# Patient Record
Sex: Female | Born: 1940 | Race: White | Hispanic: No | Marital: Married | Smoking: Never smoker
Health system: Southern US, Community
[De-identification: ages and names within clinical notes are randomized; demographics above are authoritative.]

## PROBLEM LIST (undated history)

## (undated) DIAGNOSIS — L509 Urticaria, unspecified: Secondary | ICD-10-CM

## (undated) DIAGNOSIS — I8390 Asymptomatic varicose veins of unspecified lower extremity: Secondary | ICD-10-CM

## (undated) DIAGNOSIS — I10 Essential (primary) hypertension: Secondary | ICD-10-CM

## (undated) DIAGNOSIS — L719 Rosacea, unspecified: Secondary | ICD-10-CM

## (undated) DIAGNOSIS — C801 Malignant (primary) neoplasm, unspecified: Secondary | ICD-10-CM

## (undated) DIAGNOSIS — L508 Other urticaria: Secondary | ICD-10-CM

## (undated) DIAGNOSIS — E119 Type 2 diabetes mellitus without complications: Secondary | ICD-10-CM

## (undated) DIAGNOSIS — L409 Psoriasis, unspecified: Secondary | ICD-10-CM

## (undated) DIAGNOSIS — K219 Gastro-esophageal reflux disease without esophagitis: Secondary | ICD-10-CM

## (undated) DIAGNOSIS — E538 Deficiency of other specified B group vitamins: Secondary | ICD-10-CM

## (undated) DIAGNOSIS — C4492 Squamous cell carcinoma of skin, unspecified: Secondary | ICD-10-CM

## (undated) DIAGNOSIS — R195 Other fecal abnormalities: Secondary | ICD-10-CM

## (undated) DIAGNOSIS — J189 Pneumonia, unspecified organism: Secondary | ICD-10-CM

## (undated) DIAGNOSIS — IMO0002 Reserved for concepts with insufficient information to code with codable children: Secondary | ICD-10-CM

## (undated) DIAGNOSIS — J8410 Pulmonary fibrosis, unspecified: Secondary | ICD-10-CM

## (undated) DIAGNOSIS — R112 Nausea with vomiting, unspecified: Secondary | ICD-10-CM

## (undated) DIAGNOSIS — M069 Rheumatoid arthritis, unspecified: Secondary | ICD-10-CM

## (undated) DIAGNOSIS — R519 Headache, unspecified: Secondary | ICD-10-CM

## (undated) DIAGNOSIS — M479 Spondylosis, unspecified: Secondary | ICD-10-CM

## (undated) DIAGNOSIS — Z8719 Personal history of other diseases of the digestive system: Secondary | ICD-10-CM

## (undated) DIAGNOSIS — L309 Dermatitis, unspecified: Secondary | ICD-10-CM

## (undated) DIAGNOSIS — M359 Systemic involvement of connective tissue, unspecified: Secondary | ICD-10-CM

## (undated) DIAGNOSIS — R011 Cardiac murmur, unspecified: Secondary | ICD-10-CM

## (undated) DIAGNOSIS — Z9889 Other specified postprocedural states: Secondary | ICD-10-CM

## (undated) DIAGNOSIS — M1990 Unspecified osteoarthritis, unspecified site: Secondary | ICD-10-CM

## (undated) DIAGNOSIS — M722 Plantar fascial fibromatosis: Secondary | ICD-10-CM

## (undated) DIAGNOSIS — R51 Headache: Secondary | ICD-10-CM

## (undated) DIAGNOSIS — G4730 Sleep apnea, unspecified: Secondary | ICD-10-CM

## (undated) HISTORY — PX: BILATERAL CARPAL TUNNEL RELEASE: SHX6508

## (undated) HISTORY — PX: APPENDECTOMY: SHX54

## (undated) HISTORY — PX: EYE SURGERY: SHX253

## (undated) HISTORY — PX: SKIN CANCER EXCISION: SHX779

## (undated) HISTORY — PX: SHOULDER SURGERY: SHX246

## (undated) HISTORY — PX: OTHER SURGICAL HISTORY: SHX169

## (undated) HISTORY — PX: FOOT SURGERY: SHX648

## (undated) HISTORY — PX: TONSILLECTOMY: SUR1361

## (undated) HISTORY — PX: BREAST CYST ASPIRATION: SHX578

## (undated) HISTORY — PX: CATARACT EXTRACTION: SUR2

---

## 1961-01-09 HISTORY — PX: CHOLECYSTECTOMY: SHX55

## 1966-01-09 HISTORY — PX: TUBAL LIGATION: SHX77

## 1989-01-09 HISTORY — PX: ENDOMETRIAL ABLATION: SHX621

## 2002-01-09 DIAGNOSIS — G4730 Sleep apnea, unspecified: Secondary | ICD-10-CM

## 2002-01-09 HISTORY — DX: Sleep apnea, unspecified: G47.30

## 2002-01-09 HISTORY — PX: NASAL SEPTUM SURGERY: SHX37

## 2002-12-18 ENCOUNTER — Other Ambulatory Visit: Payer: Self-pay

## 2004-05-16 ENCOUNTER — Ambulatory Visit: Payer: Self-pay | Admitting: Ophthalmology

## 2004-05-23 ENCOUNTER — Ambulatory Visit: Payer: Self-pay | Admitting: Ophthalmology

## 2004-05-26 ENCOUNTER — Ambulatory Visit: Payer: Self-pay | Admitting: Internal Medicine

## 2004-10-17 ENCOUNTER — Ambulatory Visit: Payer: Self-pay | Admitting: Internal Medicine

## 2005-06-01 ENCOUNTER — Ambulatory Visit: Payer: Self-pay | Admitting: Internal Medicine

## 2005-08-14 ENCOUNTER — Ambulatory Visit: Payer: Self-pay | Admitting: Gastroenterology

## 2006-06-04 ENCOUNTER — Ambulatory Visit: Payer: Self-pay | Admitting: Internal Medicine

## 2007-06-07 ENCOUNTER — Ambulatory Visit: Payer: Self-pay | Admitting: Internal Medicine

## 2008-06-08 ENCOUNTER — Ambulatory Visit: Payer: Self-pay | Admitting: Internal Medicine

## 2008-11-30 ENCOUNTER — Ambulatory Visit: Payer: Self-pay | Admitting: Specialist

## 2008-12-11 ENCOUNTER — Ambulatory Visit: Payer: Self-pay | Admitting: Internal Medicine

## 2009-04-21 ENCOUNTER — Ambulatory Visit: Payer: Self-pay | Admitting: Specialist

## 2009-05-31 ENCOUNTER — Ambulatory Visit: Payer: Self-pay | Admitting: Specialist

## 2009-11-23 ENCOUNTER — Ambulatory Visit: Payer: Self-pay | Admitting: Specialist

## 2009-12-14 ENCOUNTER — Ambulatory Visit: Payer: Self-pay | Admitting: Internal Medicine

## 2010-02-08 ENCOUNTER — Ambulatory Visit: Payer: Self-pay | Admitting: Anesthesiology

## 2010-02-14 ENCOUNTER — Ambulatory Visit: Payer: Self-pay | Admitting: Unknown Physician Specialty

## 2010-07-26 ENCOUNTER — Ambulatory Visit: Payer: Self-pay | Admitting: Ophthalmology

## 2010-08-01 ENCOUNTER — Ambulatory Visit: Payer: Self-pay | Admitting: Ophthalmology

## 2010-11-22 ENCOUNTER — Ambulatory Visit: Payer: Self-pay | Admitting: Specialist

## 2010-12-16 ENCOUNTER — Ambulatory Visit: Payer: Self-pay | Admitting: Internal Medicine

## 2011-12-19 ENCOUNTER — Ambulatory Visit: Payer: Self-pay | Admitting: Internal Medicine

## 2012-01-10 HISTORY — PX: OTHER SURGICAL HISTORY: SHX169

## 2013-03-14 ENCOUNTER — Ambulatory Visit: Payer: Self-pay | Admitting: Internal Medicine

## 2013-03-20 ENCOUNTER — Ambulatory Visit: Payer: Self-pay | Admitting: Internal Medicine

## 2013-04-24 DIAGNOSIS — J8410 Pulmonary fibrosis, unspecified: Secondary | ICD-10-CM | POA: Insufficient documentation

## 2013-04-24 DIAGNOSIS — M1990 Unspecified osteoarthritis, unspecified site: Secondary | ICD-10-CM | POA: Insufficient documentation

## 2013-04-24 DIAGNOSIS — M069 Rheumatoid arthritis, unspecified: Secondary | ICD-10-CM | POA: Insufficient documentation

## 2013-07-06 DIAGNOSIS — E1149 Type 2 diabetes mellitus with other diabetic neurological complication: Secondary | ICD-10-CM | POA: Insufficient documentation

## 2013-09-25 ENCOUNTER — Ambulatory Visit: Payer: Self-pay | Admitting: Rheumatology

## 2013-10-13 DIAGNOSIS — M5126 Other intervertebral disc displacement, lumbar region: Secondary | ICD-10-CM | POA: Insufficient documentation

## 2013-10-13 DIAGNOSIS — M549 Dorsalgia, unspecified: Secondary | ICD-10-CM | POA: Insufficient documentation

## 2013-10-28 ENCOUNTER — Ambulatory Visit: Payer: Self-pay | Admitting: Pain Medicine

## 2013-11-10 ENCOUNTER — Ambulatory Visit: Payer: Self-pay | Admitting: Pain Medicine

## 2013-12-11 ENCOUNTER — Ambulatory Visit: Payer: Self-pay | Admitting: Pain Medicine

## 2013-12-15 ENCOUNTER — Other Ambulatory Visit: Payer: Self-pay | Admitting: Neurosurgery

## 2013-12-22 ENCOUNTER — Encounter (HOSPITAL_COMMUNITY)
Admission: RE | Admit: 2013-12-22 | Discharge: 2013-12-22 | Disposition: A | Discharge status: Home / Self Care | Payer: Medicare Other | Source: Ambulatory Visit | Attending: Neurosurgery | Admitting: Neurosurgery

## 2013-12-22 ENCOUNTER — Encounter (HOSPITAL_COMMUNITY): Payer: Self-pay

## 2013-12-22 ENCOUNTER — Other Ambulatory Visit (HOSPITAL_COMMUNITY): Payer: Self-pay | Admitting: *Deleted

## 2013-12-22 HISTORY — DX: Other specified postprocedural states: Z98.890

## 2013-12-22 HISTORY — DX: Headache, unspecified: R51.9

## 2013-12-22 HISTORY — DX: Nausea with vomiting, unspecified: R11.2

## 2013-12-22 HISTORY — DX: Rosacea, unspecified: L71.9

## 2013-12-22 HISTORY — DX: Other specified postprocedural states: R11.2

## 2013-12-22 HISTORY — DX: Essential (primary) hypertension: I10

## 2013-12-22 HISTORY — DX: Type 2 diabetes mellitus without complications: E11.9

## 2013-12-22 HISTORY — DX: Dermatitis, unspecified: L30.9

## 2013-12-22 HISTORY — DX: Unspecified osteoarthritis, unspecified site: M19.90

## 2013-12-22 HISTORY — DX: Asymptomatic varicose veins of unspecified lower extremity: I83.90

## 2013-12-22 HISTORY — DX: Urticaria, unspecified: L50.9

## 2013-12-22 HISTORY — DX: Malignant (primary) neoplasm, unspecified: C80.1

## 2013-12-22 HISTORY — DX: Gastro-esophageal reflux disease without esophagitis: K21.9

## 2013-12-22 HISTORY — DX: Sleep apnea, unspecified: G47.30

## 2013-12-22 HISTORY — DX: Headache: R51

## 2013-12-22 LAB — CBC
HCT: 41.1 % (ref 36.0–46.0)
Hemoglobin: 13.3 g/dL (ref 12.0–15.0)
MCH: 26.5 pg (ref 26.0–34.0)
MCHC: 32.4 g/dL (ref 30.0–36.0)
MCV: 82 fL (ref 78.0–100.0)
PLATELETS: 161 10*3/uL (ref 150–400)
RBC: 5.01 MIL/uL (ref 3.87–5.11)
RDW: 14 % (ref 11.5–15.5)
WBC: 8.5 10*3/uL (ref 4.0–10.5)

## 2013-12-22 LAB — BASIC METABOLIC PANEL
Anion gap: 15 (ref 5–15)
BUN: 12 mg/dL (ref 6–23)
CALCIUM: 9.6 mg/dL (ref 8.4–10.5)
CO2: 23 mEq/L (ref 19–32)
CREATININE: 0.65 mg/dL (ref 0.50–1.10)
Chloride: 99 mEq/L (ref 96–112)
GFR calc Af Amer: >90 mL/min (ref >90)
GFR calc non Af Amer: 85 mL/min — ABNORMAL LOW (ref >90)
GLUCOSE: 180 mg/dL — AB (ref 70–99)
Potassium: 3.7 mEq/L (ref 3.7–5.3)
Sodium: 137 mEq/L (ref 137–147)

## 2013-12-22 LAB — SURGICAL PCR SCREEN
MRSA, PCR: POSITIVE — AB
Staphylococcus aureus: POSITIVE — AB

## 2013-12-22 NOTE — Pre-Procedure Instructions (Signed)
Courtney [name]  12/22/2013   Your procedure is scheduled on:  Wednesday, December 24, 2013 at 4:50 PM.   Report to [facility] Entrance "A" Admitting Office at 2:00 PM.   Call this number if you have problems the morning of surgery: [phone]   Remember:   Do not eat food or drink liquids after midnight Tuesday, 12//15/15.   Take these medicines the morning of surgery with A SIP OF WATER: bisoprolol-hydrochlorothiazide (ZIAC),  doxazosin (CARDURA), leflunomide (ARAVA),  omeprazole (PRILOSEC), ranitidine (ZANTAC)  Do not take diabetic medications morning of surgery.   Do not wear jewelry, make-up or nail polish.  Do not wear lotions, powders, or perfumes. You may wear deodorant.  Do not shave 48 hours prior to surgery.   Do not bring valuables to the hospital.  [facility] is not responsible                  for any belongings or valuables.               Contacts, dentures or bridgework may not be worn into surgery.  Leave suitcase in the car. After surgery it may be brought to your room.  For patients admitted to the hospital, discharge time is determined by your                treatment team.               Special Instructions: [facility] - Preparing for Surgery  Before surgery, you can play an important role.  Because skin is not sterile, your skin needs to be as free of germs as possible.  You can reduce the number of germs on you skin by washing with CHG (chlorahexidine gluconate) soap before surgery.  CHG is an antiseptic cleaner which kills germs and bonds with the skin to continue killing germs even after washing.  Please DO NOT use if you have an allergy to CHG or antibacterial soaps.  If your skin becomes reddened/irritated stop using the CHG and inform your nurse when you arrive at Short Stay.  Do not shave (including legs and underarms) for at least 48 hours prior to the first CHG shower.  You may shave your face.  Please follow these instructions  carefully:   1.  Shower with CHG Soap the night before surgery and the                                morning of Surgery.  2.  If you choose to wash your hair, wash your hair first as usual with your       normal shampoo.  3.  After you shampoo, rinse your hair and body thoroughly to remove the                      Shampoo.  4.  Use CHG as you would any other liquid soap.  You can apply chg directly       to the skin and wash gently with scrungie or a clean washcloth.  5.  Apply the CHG Soap to your body ONLY FROM THE NECK DOWN.        Do not use on open wounds or open sores.  Avoid contact with your eyes, ears, mouth and genitals (private parts).  Wash genitals (private parts) with your normal soap.  6.  Wash thoroughly, paying special attention to  the area where your surgery        will be performed.  7.  Thoroughly rinse your body with warm water from the neck down.  8.  DO NOT shower/wash with your normal soap after using and rinsing off       the CHG Soap.  9.  Pat yourself dry with a clean towel.            10.  Wear clean pajamas.            11.  Place clean sheets on your bed the night of your first shower and do not        sleep with pets.  Day of Surgery  Do not apply any lotions the morning of surgery.  Please wear clean clothes to the hospital.     Please read over the following fact sheets that you were given: Pain Booklet, Coughing and Deep Breathing, MRSA Information and Surgical Site Infection Prevention

## 2013-12-22 NOTE — Progress Notes (Signed)
Pt denies chest pain or sob, denies cardiac history. No EKG found in Care Everywhere.

## 2013-12-22 NOTE — Progress Notes (Signed)
Mupirocin Ointment rx called into [facility] on [address] for positive PCR of MRSA and Staph. Pt notified and voiced understanding.

## 2013-12-23 MED ORDER — CEFAZOLIN SODIUM-DEXTROSE 2-3 GM-% IV SOLR
2.0000 g | INTRAVENOUS | Status: AC
  Administered 2013-12-24: 2 g via INTRAVENOUS
  Filled 2013-12-23: qty 50

## 2013-12-23 NOTE — Progress Notes (Signed)
Anesthesia Chart Review:  Patient is a 73 year old female scheduled for right L3-4, L4-5, L5-S1 laminectomy/foraminotomy on 12/24/13 by Dr. Joya Salm.  History includes DM2 on insulin, RA, HTN, OSA without CPAP use, migraines, GERD, non-smoker, post-operative N/V, cholecystectomy, nasal septum surgery, skin cancer excision. BMI is 33.60. PCP is Dr. Emily Filbert, last visit 07/07/13 for wellness exam.  Rheumatologist is Dr. Percell Boston.   Meds: ASA (on hold), bisoprolol-HCTZ, Cardura, Remicade, Novolin N, Novolin R, Arava, lisinopril, omeprazole, rantindine, simvastatin.   EKG 12/22/13:  SR with first degree AVB, inferior infarct (age undetermined), poor precordial r wave progression - possible anterior infarct (age undetermined). There is no comparison EKG at Dr. Ammie Ferrier office or in [place] or [place]. I was able to obtain an old EKG on 07/26/10 that showed similar poor r wave progression.  Inferior leads are more difficult to read on the [facility] EKG due to dark tracing.  It looks like she has at least a low r wave in III, but aVF appears upright but low voltage.   Preoperative labs noted.  Glucose 180.  I reviewed above with anesthesiologist Dr. Tobias Alexander.  Lead aVF is now negative, but other leads probably not significantly changed since 2012.  If patient without CV symptoms he felt she could likely proceed as planned.  I called and spoke with her.  She reports good DM control. No neck mobility limitations with her RA. Last Remicade dose held per rheumatology due to upcoming surgery.  She denies chest pain, SOB at rest.  She has chronic DOE which has not changed in the past year. She is not very active due to her back issues and balance (fall risk). She walks with a walker now.  She has chronic mild ankle edema.  Reports a normal stress test and treatment for "tachycardia" (following a BP medication change) ~ 2000.  No known recent tachycardia.  No known echo or cath.  No known CAD/CHF history.  Based on this, I  would anticipate that she could proceed as planned.  [facility] Short Stay Center/Anesthesiology Phone [phone] 12/23/2013 4:13 PM

## 2013-12-23 NOTE — H&P (Signed)
Courtney [name] is an 73 y.o. female.   Chief Complaint: right leg pain HPI: one year history of lumbar pain with radiation to the right leg, no better with conservative treatment.  Past Medical History  Diagnosis Date  . Hypertension   . Arthritis     rheumatoid arthritis  . Diabetes mellitus without complication     type 2  . Varicose veins   . Sleep apnea 2004    does not use cpap  . GERD (gastroesophageal reflux disease)   . Headache     migraines in the past (none since menopause)  . Cancer     skin cancer (squamous cell)  . Hives     "chronic"  . Dermatitis   . Rosacea   . Eczema   . PONV (postoperative nausea and vomiting)     Past Surgical History  Procedure Laterality Date  . Eye surgery Bilateral 2006 and 2012    cataract surgery with lens implant  . Cholecystectomy  1963  . Tubal ligation  1968  . Endometrial ablation  1991  . Foot surgery Right     ligament and spurs  . Shoulder surgery Right     for a frozen shoulder  . Nasal septum surgery  2004  . Bilateral carpal tunnel release    . Skin cancer excision Right     leg x 4  . Eyelid surgery Bilateral 2014    Family History  Problem Relation Age of Onset  . COPD Mother   . Arthritis/Rheumatoid Mother   . Alzheimer's disease Father   . Heart attack Father    Social History:  reports that she has never smoked. She has never used smokeless tobacco. She reports that she does not drink alcohol or use illicit drugs.  Allergies:  Allergies  Allergen Reactions  . Methotrexate Derivatives Shortness Of Breath    Breathing difficulties   . Contrast Media [Iodinated Diagnostic Agents] Itching and Rash  . Inderide [Propranolol-Hctz] Rash  . Lodine [Etodolac] Rash  . Procardia [Nifedipine] Rash    No prescriptions prior to admission    Results for orders placed or performed during the hospital encounter of 12/22/13 (from the past 48 hour(s))  Surgical pcr screen     Status: Abnormal   Collection  Time: 12/22/13  2:36 PM  Result Value Ref Range   MRSA, PCR POSITIVE (A) NEGATIVE   Staphylococcus aureus POSITIVE (A) NEGATIVE    Comment:        The Xpert SA Assay (FDA approved for NASAL specimens in patients over 49 years of age), is one component of a comprehensive surveillance program.  Test performance has been validated by [employer] for patients greater than or equal to 21 year old. It is not intended to diagnose infection nor to guide or monitor treatment.   Basic metabolic panel     Status: Abnormal   Collection Time: 12/22/13  2:36 PM  Result Value Ref Range   Sodium 137 137 - 147 mEq/L   Potassium 3.7 3.7 - 5.3 mEq/L   Chloride 99 96 - 112 mEq/L   CO2 23 19 - 32 mEq/L   Glucose, Bld 180 (H) 70 - 99 mg/dL   BUN 12 6 - 23 mg/dL   Creatinine, Ser 0.65 0.50 - 1.10 mg/dL   Calcium 9.6 8.4 - 10.5 mg/dL   GFR calc non Af Amer 85 (L) >90 mL/min   GFR calc Af Amer >90 >90 mL/min    Comment: (NOTE)  The eGFR has been calculated using the CKD EPI equation. This calculation has not been validated in all clinical situations. eGFR's persistently <90 mL/min signify possible Chronic Kidney Disease.    Anion gap 15 5 - 15  CBC     Status: None   Collection Time: 12/22/13  2:36 PM  Result Value Ref Range   WBC 8.5 4.0 - 10.5 K/uL   RBC 5.01 3.87 - 5.11 MIL/uL   Hemoglobin 13.3 12.0 - 15.0 g/dL   HCT 41.1 36.0 - 46.0 %   MCV 82.0 78.0 - 100.0 fL   MCH 26.5 26.0 - 34.0 pg   MCHC 32.4 30.0 - 36.0 g/dL   RDW 14.0 11.5 - 15.5 %   Platelets 161 150 - 400 K/uL   No results found.  Review of Systems  Constitutional: Negative.   HENT: Positive for tinnitus.   Eyes: Negative.   Respiratory: Positive for shortness of breath.   Cardiovascular: Negative.   Gastrointestinal: Negative.   Genitourinary: Negative.   Musculoskeletal: Positive for back pain.  Skin: Positive for rash.  Neurological: Positive for sensory change and focal weakness.  Endo/Heme/Allergies: Negative.    Psychiatric/Behavioral: Negative.     There were no vitals taken for this visit. Physical Exam hent, nl. Neck, nl. Cv, nl. Lungs, some rales. Abdomen, soft. Extremities, nl. NEURO WEAKNESS OF RIGHT QUADRICEPS. SENSORY, NL. dtr absent rihgt knee reflex. Radiological studies shows step-off at l34,45. 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 She and her husband are aware of tisks and benefits  , M 12/23/2013, 5:57 PM

## 2013-12-23 NOTE — Progress Notes (Signed)
Mrs. Pucillo called this am stating that [facility] did not have her rx. I told her that I had left a message on their voicemail, she states she will call them back. I called [facility] to check on it and they said they did not get the message. I talked with pharmacist and gave the rx to her. I called Mrs. Colvin and let her know that I had called it in again and spoke with the pharmacist.

## 2013-12-24 ENCOUNTER — Inpatient Hospital Stay (HOSPITAL_COMMUNITY)
Admission: RE | Admit: 2013-12-24 | Discharge: 2013-12-26 | DRG: 517 | Disposition: A | Discharge status: Home / Self Care | Payer: Medicare Other | Source: Ambulatory Visit | Attending: Neurosurgery | Admitting: Neurosurgery

## 2013-12-24 ENCOUNTER — Inpatient Hospital Stay (HOSPITAL_COMMUNITY): Payer: Medicare Other

## 2013-12-24 ENCOUNTER — Inpatient Hospital Stay (HOSPITAL_COMMUNITY): Payer: Medicare Other | Admitting: Certified Registered"

## 2013-12-24 ENCOUNTER — Encounter (HOSPITAL_COMMUNITY)
Admission: RE | Disposition: A | Discharge status: Home / Self Care | Payer: Self-pay | Source: Ambulatory Visit | Attending: Neurosurgery

## 2013-12-24 ENCOUNTER — Inpatient Hospital Stay (HOSPITAL_COMMUNITY): Payer: Medicare Other | Admitting: Vascular Surgery

## 2013-12-24 ENCOUNTER — Encounter (HOSPITAL_COMMUNITY): Payer: Self-pay | Admitting: *Deleted

## 2013-12-24 DIAGNOSIS — Z7982 Long term (current) use of aspirin: Secondary | ICD-10-CM

## 2013-12-24 DIAGNOSIS — M5127 Other intervertebral disc displacement, lumbosacral region: Secondary | ICD-10-CM

## 2013-12-24 DIAGNOSIS — Z91041 Radiographic dye allergy status: Secondary | ICD-10-CM | POA: Diagnosis not present

## 2013-12-24 DIAGNOSIS — G4730 Sleep apnea, unspecified: Secondary | ICD-10-CM | POA: Diagnosis present

## 2013-12-24 DIAGNOSIS — Z79899 Other long term (current) drug therapy: Secondary | ICD-10-CM | POA: Diagnosis not present

## 2013-12-24 DIAGNOSIS — Z794 Long term (current) use of insulin: Secondary | ICD-10-CM

## 2013-12-24 DIAGNOSIS — I10 Essential (primary) hypertension: Secondary | ICD-10-CM | POA: Diagnosis present

## 2013-12-24 DIAGNOSIS — M79604 Pain in right leg: Secondary | ICD-10-CM | POA: Diagnosis present

## 2013-12-24 DIAGNOSIS — M4806 Spinal stenosis, lumbar region: Secondary | ICD-10-CM | POA: Diagnosis not present

## 2013-12-24 DIAGNOSIS — Z888 Allergy status to other drugs, medicaments and biological substances status: Secondary | ICD-10-CM | POA: Diagnosis not present

## 2013-12-24 DIAGNOSIS — M5116 Intervertebral disc disorders with radiculopathy, lumbar region: Principal | ICD-10-CM | POA: Diagnosis present

## 2013-12-24 DIAGNOSIS — Z8249 Family history of ischemic heart disease and other diseases of the circulatory system: Secondary | ICD-10-CM | POA: Diagnosis not present

## 2013-12-24 DIAGNOSIS — Z8261 Family history of arthritis: Secondary | ICD-10-CM

## 2013-12-24 DIAGNOSIS — Z820 Family history of epilepsy and other diseases of the nervous system: Secondary | ICD-10-CM | POA: Diagnosis not present

## 2013-12-24 DIAGNOSIS — E119 Type 2 diabetes mellitus without complications: Secondary | ICD-10-CM | POA: Diagnosis present

## 2013-12-24 DIAGNOSIS — Z825 Family history of asthma and other chronic lower respiratory diseases: Secondary | ICD-10-CM

## 2013-12-24 DIAGNOSIS — K219 Gastro-esophageal reflux disease without esophagitis: Secondary | ICD-10-CM | POA: Diagnosis not present

## 2013-12-24 DIAGNOSIS — M4726 Other spondylosis with radiculopathy, lumbar region: Secondary | ICD-10-CM | POA: Diagnosis present

## 2013-12-24 DIAGNOSIS — M5136 Other intervertebral disc degeneration, lumbar region: Secondary | ICD-10-CM | POA: Diagnosis present

## 2013-12-24 DIAGNOSIS — M51369 Other intervertebral disc degeneration, lumbar region without mention of lumbar back pain or lower extremity pain: Secondary | ICD-10-CM | POA: Diagnosis present

## 2013-12-24 DIAGNOSIS — M069 Rheumatoid arthritis, unspecified: Secondary | ICD-10-CM | POA: Diagnosis present

## 2013-12-24 HISTORY — PX: LUMBAR LAMINECTOMY/DECOMPRESSION MICRODISCECTOMY: SHX5026

## 2013-12-24 LAB — GLUCOSE, CAPILLARY
Glucose-Capillary: 213 mg/dL — ABNORMAL HIGH (ref 70–99)
Glucose-Capillary: 192 mg/dL — ABNORMAL HIGH (ref 70–99)
Glucose-Capillary: 283 mg/dL — ABNORMAL HIGH (ref 70–99)

## 2013-12-24 SURGERY — LUMBAR LAMINECTOMY/DECOMPRESSION MICRODISCECTOMY 3 LEVELS
Anesthesia: General | Laterality: Right

## 2013-12-24 MED ORDER — ACETAMINOPHEN 325 MG PO TABS: 650.0000 mg | ORAL_TABLET | ORAL | Status: DC | PRN

## 2013-12-24 MED ORDER — HYDROCHLOROTHIAZIDE 25 MG PO TABS
25.0000 mg | ORAL_TABLET | Freq: Every day | ORAL | Status: DC
  Administered 2013-12-24 – 2013-12-26 (×3): 25 mg via ORAL
  Filled 2013-12-24 (×3): qty 1

## 2013-12-24 MED ORDER — BUPIVACAINE LIPOSOME 1.3 % IJ SUSP
20.0000 mL | INTRAMUSCULAR | Status: AC
  Filled 2013-12-24: qty 20

## 2013-12-24 MED ORDER — PROPOFOL 10 MG/ML IV BOLUS
INTRAVENOUS | Status: DC | PRN
  Administered 2013-12-24: 130 mg via INTRAVENOUS

## 2013-12-24 MED ORDER — DIAZEPAM 5 MG PO TABS
5.0000 mg | ORAL_TABLET | Freq: Four times a day (QID) | ORAL | Status: DC | PRN
  Administered 2013-12-25: 5 mg via ORAL
  Filled 2013-12-24: qty 1

## 2013-12-24 MED ORDER — INSULIN ASPART 100 UNIT/ML ~~LOC~~ SOLN
0.0000 [IU] | Freq: Every day | SUBCUTANEOUS | Status: DC
  Administered 2013-12-24: 3 [IU] via SUBCUTANEOUS
  Administered 2013-12-25: 4 [IU] via SUBCUTANEOUS

## 2013-12-24 MED ORDER — LEFLUNOMIDE 20 MG PO TABS
10.0000 mg | ORAL_TABLET | Freq: Every day | ORAL | Status: DC
  Administered 2013-12-25 – 2013-12-26 (×2): 10 mg via ORAL
  Filled 2013-12-24 (×3): qty 1

## 2013-12-24 MED ORDER — SODIUM CHLORIDE 0.9 % IV SOLN
INTRAVENOUS | Status: DC
  Administered 2013-12-25 (×2): via INTRAVENOUS

## 2013-12-24 MED ORDER — ONDANSETRON HCL 4 MG/2ML IJ SOLN
INTRAMUSCULAR | Status: AC
  Filled 2013-12-24: qty 2

## 2013-12-24 MED ORDER — GLYCOPYRROLATE 0.2 MG/ML IJ SOLN
INTRAMUSCULAR | Status: DC | PRN
  Administered 2013-12-24: .8 mg via INTRAVENOUS

## 2013-12-24 MED ORDER — ARTIFICIAL TEARS OP OINT
TOPICAL_OINTMENT | OPHTHALMIC | Status: AC
  Filled 2013-12-24: qty 3.5

## 2013-12-24 MED ORDER — LACTATED RINGERS IV SOLN
INTRAVENOUS | Status: DC
  Administered 2013-12-24 (×2): via INTRAVENOUS

## 2013-12-24 MED ORDER — ROCURONIUM BROMIDE 100 MG/10ML IV SOLN
INTRAVENOUS | Status: DC | PRN
  Administered 2013-12-24: 20 mg via INTRAVENOUS
  Administered 2013-12-24: 30 mg via INTRAVENOUS

## 2013-12-24 MED ORDER — OXYCODONE HCL 5 MG/5ML PO SOLN: 5.0000 mg | Freq: Once | ORAL | Status: DC | PRN

## 2013-12-24 MED ORDER — THROMBIN 5000 UNITS EX SOLR
CUTANEOUS | Status: DC | PRN
  Administered 2013-12-24 (×2): 5000 [IU] via TOPICAL

## 2013-12-24 MED ORDER — SODIUM CHLORIDE 0.9 % IJ SOLN
3.0000 mL | Freq: Two times a day (BID) | INTRAMUSCULAR | Status: DC
  Administered 2013-12-24 – 2013-12-25 (×2): 3 mL via INTRAVENOUS

## 2013-12-24 MED ORDER — CEFAZOLIN SODIUM 1-5 GM-% IV SOLN
1.0000 g | Freq: Three times a day (TID) | INTRAVENOUS | Status: AC
  Administered 2013-12-24 – 2013-12-25 (×2): 1 g via INTRAVENOUS
  Filled 2013-12-24 (×2): qty 50

## 2013-12-24 MED ORDER — VANCOMYCIN HCL 1000 MG IV SOLR
INTRAVENOUS | Status: AC
  Filled 2013-12-24: qty 1000

## 2013-12-24 MED ORDER — SIMVASTATIN 40 MG PO TABS
40.0000 mg | ORAL_TABLET | Freq: Every day | ORAL | Status: DC
  Administered 2013-12-24 – 2013-12-25 (×2): 40 mg via ORAL
  Filled 2013-12-24 (×2): qty 1

## 2013-12-24 MED ORDER — ACETAMINOPHEN 650 MG RE SUPP
650.0000 mg | RECTAL | Status: DC | PRN
Start: 2013-12-24 — End: 2013-12-26

## 2013-12-24 MED ORDER — ZOLPIDEM TARTRATE 5 MG PO TABS
5.0000 mg | ORAL_TABLET | Freq: Every evening | ORAL | Status: DC | PRN
Start: 2013-12-24 — End: 2013-12-26

## 2013-12-24 MED ORDER — HYDROMORPHONE HCL 1 MG/ML IJ SOLN
0.2500 mg | INTRAMUSCULAR | Status: DC | PRN
  Administered 2013-12-24 (×2): 0.5 mg via INTRAVENOUS

## 2013-12-24 MED ORDER — INSULIN ASPART 100 UNIT/ML ~~LOC~~ SOLN
0.0000 [IU] | Freq: Three times a day (TID) | SUBCUTANEOUS | Status: DC
  Administered 2013-12-25: 20 [IU] via SUBCUTANEOUS
  Administered 2013-12-25: 15 [IU] via SUBCUTANEOUS
  Administered 2013-12-25: 20 [IU] via SUBCUTANEOUS
  Administered 2013-12-26: 11 [IU] via SUBCUTANEOUS

## 2013-12-24 MED ORDER — SUCCINYLCHOLINE CHLORIDE 20 MG/ML IJ SOLN
INTRAMUSCULAR | Status: DC | PRN
  Administered 2013-12-24: 100 mg via INTRAVENOUS

## 2013-12-24 MED ORDER — VANCOMYCIN HCL 1000 MG IV SOLR
INTRAVENOUS | Status: DC | PRN
  Administered 2013-12-24: 1000 mg

## 2013-12-24 MED ORDER — OXYCODONE HCL 5 MG PO TABS: 5.0000 mg | ORAL_TABLET | Freq: Once | ORAL | Status: DC | PRN

## 2013-12-24 MED ORDER — ONDANSETRON HCL 4 MG/2ML IJ SOLN
4.0000 mg | INTRAMUSCULAR | Status: DC | PRN
  Administered 2013-12-24: 4 mg via INTRAVENOUS
  Filled 2013-12-24: qty 2

## 2013-12-24 MED ORDER — FENTANYL CITRATE 0.05 MG/ML IJ SOLN
INTRAMUSCULAR | Status: DC | PRN
  Administered 2013-12-24: 100 ug via INTRAVENOUS
  Administered 2013-12-24 (×3): 50 ug via INTRAVENOUS
  Administered 2013-12-24: 100 ug via INTRAVENOUS
  Administered 2013-12-24: 50 ug via INTRAVENOUS
  Administered 2013-12-24: 100 ug via INTRAVENOUS

## 2013-12-24 MED ORDER — PHENOL 1.4 % MT LIQD: 1.0000 | OROMUCOSAL | Status: DC | PRN

## 2013-12-24 MED ORDER — NEOSTIGMINE METHYLSULFATE 10 MG/10ML IV SOLN
INTRAVENOUS | Status: DC | PRN
Start: 2013-12-24 — End: 2013-12-24
  Administered 2013-12-24: 5 mg via INTRAVENOUS

## 2013-12-24 MED ORDER — SODIUM CHLORIDE 0.9 % IJ SOLN: 3.0000 mL | INTRAMUSCULAR | Status: DC | PRN

## 2013-12-24 MED ORDER — FENTANYL CITRATE 0.05 MG/ML IJ SOLN
INTRAMUSCULAR | Status: AC
  Filled 2013-12-24: qty 5

## 2013-12-24 MED ORDER — MENTHOL 3 MG MT LOZG: 1.0000 | LOZENGE | OROMUCOSAL | Status: DC | PRN

## 2013-12-24 MED ORDER — DEXAMETHASONE SODIUM PHOSPHATE 10 MG/ML IJ SOLN
INTRAMUSCULAR | Status: DC | PRN
  Administered 2013-12-24: 10 mg via INTRAVENOUS

## 2013-12-24 MED ORDER — LISINOPRIL 40 MG PO TABS
40.0000 mg | ORAL_TABLET | Freq: Every day | ORAL | Status: DC
  Administered 2013-12-25 – 2013-12-26 (×2): 40 mg via ORAL
  Filled 2013-12-24: qty 1
  Filled 2013-12-24 (×2): qty 2
  Filled 2013-12-24 (×2): qty 1

## 2013-12-24 MED ORDER — 0.9 % SODIUM CHLORIDE (POUR BTL) OPTIME
TOPICAL | Status: DC | PRN
  Administered 2013-12-24: 1000 mL

## 2013-12-24 MED ORDER — BUPIVACAINE LIPOSOME 1.3 % IJ SUSP
INTRAMUSCULAR | Status: DC | PRN
  Administered 2013-12-24: 20 mL

## 2013-12-24 MED ORDER — PROMETHAZINE HCL 25 MG/ML IJ SOLN
INTRAMUSCULAR | Status: AC
  Filled 2013-12-24: qty 1

## 2013-12-24 MED ORDER — MORPHINE SULFATE 2 MG/ML IJ SOLN
1.0000 mg | INTRAMUSCULAR | Status: DC | PRN
  Administered 2013-12-24 – 2013-12-25 (×2): 2 mg via INTRAVENOUS
  Filled 2013-12-24 (×2): qty 1

## 2013-12-24 MED ORDER — DEXAMETHASONE SODIUM PHOSPHATE 10 MG/ML IJ SOLN
INTRAMUSCULAR | Status: AC
  Filled 2013-12-24: qty 1

## 2013-12-24 MED ORDER — BISOPROLOL-HYDROCHLOROTHIAZIDE 2.5-6.25 MG PO TABS
2.0000 | ORAL_TABLET | Freq: Every day | ORAL | Status: DC
  Administered 2013-12-25 – 2013-12-26 (×2): 2 via ORAL
  Filled 2013-12-24 (×3): qty 2

## 2013-12-24 MED ORDER — ASPIRIN EC 81 MG PO TBEC
81.0000 mg | DELAYED_RELEASE_TABLET | Freq: Every day | ORAL | Status: DC
  Administered 2013-12-24 – 2013-12-26 (×3): 81 mg via ORAL
  Filled 2013-12-24 (×3): qty 1

## 2013-12-24 MED ORDER — ROCURONIUM BROMIDE 50 MG/5ML IV SOLN
INTRAVENOUS | Status: AC
  Filled 2013-12-24: qty 1

## 2013-12-24 MED ORDER — OXYCODONE-ACETAMINOPHEN 5-325 MG PO TABS
1.0000 | ORAL_TABLET | ORAL | Status: DC | PRN
  Administered 2013-12-25 – 2013-12-26 (×4): 2 via ORAL
  Filled 2013-12-24 (×4): qty 2

## 2013-12-24 MED ORDER — LIDOCAINE HCL (CARDIAC) 20 MG/ML IV SOLN
INTRAVENOUS | Status: DC | PRN
  Administered 2013-12-24: 60 mg via INTRAVENOUS

## 2013-12-24 MED ORDER — PROPOFOL 10 MG/ML IV BOLUS
INTRAVENOUS | Status: AC
  Filled 2013-12-24: qty 20

## 2013-12-24 MED ORDER — LEFLUNOMIDE 10 MG PO TABS: 10.0000 mg | ORAL_TABLET | Freq: Every day | ORAL | Status: DC

## 2013-12-24 MED ORDER — INFLIXIMAB 100 MG IV SOLR: 500.0000 mg | INTRAVENOUS | Status: DC

## 2013-12-24 MED ORDER — LIDOCAINE HCL (CARDIAC) 20 MG/ML IV SOLN
INTRAVENOUS | Status: AC
  Filled 2013-12-24: qty 5

## 2013-12-24 MED ORDER — ONDANSETRON HCL 4 MG/2ML IJ SOLN
INTRAMUSCULAR | Status: DC | PRN
  Administered 2013-12-24: 4 mg via INTRAVENOUS

## 2013-12-24 MED ORDER — HEMOSTATIC AGENTS (NO CHARGE) OPTIME
TOPICAL | Status: DC | PRN
  Administered 2013-12-24: 1 via TOPICAL

## 2013-12-24 MED ORDER — HYDROMORPHONE HCL 1 MG/ML IJ SOLN
INTRAMUSCULAR | Status: AC
  Filled 2013-12-24: qty 1

## 2013-12-24 MED ORDER — PROMETHAZINE HCL 25 MG/ML IJ SOLN
6.2500 mg | INTRAMUSCULAR | Status: DC | PRN
  Administered 2013-12-24: 6.25 mg via INTRAVENOUS

## 2013-12-24 MED ORDER — DOXAZOSIN MESYLATE 8 MG PO TABS
8.0000 mg | ORAL_TABLET | Freq: Every day | ORAL | Status: DC
  Administered 2013-12-25 – 2013-12-26 (×2): 8 mg via ORAL
  Filled 2013-12-24 (×3): qty 1

## 2013-12-24 MED ORDER — PANTOPRAZOLE SODIUM 40 MG PO TBEC
40.0000 mg | DELAYED_RELEASE_TABLET | Freq: Every day | ORAL | Status: DC
  Administered 2013-12-25 – 2013-12-26 (×2): 40 mg via ORAL
  Filled 2013-12-24 (×2): qty 1

## 2013-12-24 SURGICAL SUPPLY — 58 items
BENZOIN TINCTURE PRP APPL 2/3 (GAUZE/BANDAGES/DRESSINGS) ×3 IMPLANT
BLADE CLIPPER SURG (BLADE) IMPLANT
BUR ACORN 6.0 (BURR) ×2 IMPLANT
BUR ACORN 6.0MM (BURR) ×1
BUR MATCHSTICK NEURO 3.0 LAGG (BURR) ×3 IMPLANT
CANISTER SUCT 3000ML (MISCELLANEOUS) ×3 IMPLANT
CLOSURE WOUND 1/2 X4 (GAUZE/BANDAGES/DRESSINGS) ×1
CONT SPEC 4OZ CLIKSEAL STRL BL (MISCELLANEOUS) ×3 IMPLANT
DRAPE LAPAROTOMY 100X72X124 (DRAPES) ×3 IMPLANT
DRAPE MICROSCOPE LEICA (MISCELLANEOUS) ×3 IMPLANT
DRAPE POUCH INSTRU U-SHP 10X18 (DRAPES) ×3 IMPLANT
DRSG OPSITE POSTOP 4X6 (GAUZE/BANDAGES/DRESSINGS) ×3 IMPLANT
DRSG PAD ABDOMINAL 8X10 ST (GAUZE/BANDAGES/DRESSINGS) IMPLANT
DURAPREP 26ML APPLICATOR (WOUND CARE) ×3 IMPLANT
ELECT BLADE 4.0 EZ CLEAN MEGAD (MISCELLANEOUS) ×3
ELECT REM PT RETURN 9FT ADLT (ELECTROSURGICAL) ×3
ELECTRODE BLDE 4.0 EZ CLN MEGD (MISCELLANEOUS) ×1 IMPLANT
ELECTRODE REM PT RTRN 9FT ADLT (ELECTROSURGICAL) ×1 IMPLANT
GAUZE SPONGE 4X4 12PLY STRL (GAUZE/BANDAGES/DRESSINGS) ×3 IMPLANT
GAUZE SPONGE 4X4 16PLY XRAY LF (GAUZE/BANDAGES/DRESSINGS) IMPLANT
GLOVE BIOGEL M 8.0 STRL (GLOVE) ×3 IMPLANT
GLOVE ECLIPSE 7.5 STRL STRAW (GLOVE) ×9 IMPLANT
GLOVE EXAM NITRILE LRG STRL (GLOVE) IMPLANT
GLOVE EXAM NITRILE MD LF STRL (GLOVE) IMPLANT
GLOVE EXAM NITRILE XL STR (GLOVE) IMPLANT
GLOVE EXAM NITRILE XS STR PU (GLOVE) IMPLANT
GLOVE INDICATOR 7.5 STRL GRN (GLOVE) ×6 IMPLANT
GLOVE SURG SS PI 7.0 STRL IVOR (GLOVE) ×6 IMPLANT
GOWN STRL REUS W/ TWL LRG LVL3 (GOWN DISPOSABLE) ×2 IMPLANT
GOWN STRL REUS W/ TWL XL LVL3 (GOWN DISPOSABLE) ×1 IMPLANT
GOWN STRL REUS W/TWL 2XL LVL3 (GOWN DISPOSABLE) IMPLANT
GOWN STRL REUS W/TWL LRG LVL3 (GOWN DISPOSABLE) ×4
GOWN STRL REUS W/TWL XL LVL3 (GOWN DISPOSABLE) ×2
KIT BASIN OR (CUSTOM PROCEDURE TRAY) ×3 IMPLANT
KIT ROOM TURNOVER OR (KITS) ×3 IMPLANT
NEEDLE HYPO 18GX1.5 BLUNT FILL (NEEDLE) IMPLANT
NEEDLE HYPO 21X1.5 SAFETY (NEEDLE) ×3 IMPLANT
NEEDLE HYPO 25X1 1.5 SAFETY (NEEDLE) IMPLANT
NEEDLE SPNL 20GX3.5 QUINCKE YW (NEEDLE) IMPLANT
NS IRRIG 1000ML POUR BTL (IV SOLUTION) ×3 IMPLANT
PACK LAMINECTOMY NEURO (CUSTOM PROCEDURE TRAY) ×3 IMPLANT
PAD ARMBOARD 7.5X6 YLW CONV (MISCELLANEOUS) ×9 IMPLANT
PATTIES SURGICAL .5 X1 (DISPOSABLE) ×3 IMPLANT
RUBBERBAND STERILE (MISCELLANEOUS) ×6 IMPLANT
SPONGE LAP 4X18 X RAY DECT (DISPOSABLE) IMPLANT
SPONGE SURGIFOAM ABS GEL SZ50 (HEMOSTASIS) ×3 IMPLANT
STAPLER VISISTAT 35W (STAPLE) ×3 IMPLANT
STRIP CLOSURE SKIN 1/2X4 (GAUZE/BANDAGES/DRESSINGS) ×2 IMPLANT
SUT VIC AB 0 CT1 18XCR BRD8 (SUTURE) ×1 IMPLANT
SUT VIC AB 0 CT1 8-18 (SUTURE) ×2
SUT VIC AB 2-0 CP2 18 (SUTURE) ×3 IMPLANT
SUT VIC AB 3-0 SH 8-18 (SUTURE) ×3 IMPLANT
SYR 20CC LL (SYRINGE) ×3 IMPLANT
SYR 20ML ECCENTRIC (SYRINGE) ×3 IMPLANT
SYR 5ML LL (SYRINGE) IMPLANT
TOWEL OR 17X24 6PK STRL BLUE (TOWEL DISPOSABLE) ×3 IMPLANT
TOWEL OR 17X26 10 PK STRL BLUE (TOWEL DISPOSABLE) ×3 IMPLANT
WATER STERILE IRR 1000ML POUR (IV SOLUTION) ×3 IMPLANT

## 2013-12-24 NOTE — Anesthesia Procedure Notes (Signed)
Procedure Name: Intubation Date/Time: 12/24/2013 2:33 PM Performed by: Melina Copa, Lenaya Pietsch R Pre-anesthesia Checklist: Patient identified, Emergency Drugs available, Suction available, Patient being monitored and Timeout performed Patient Re-evaluated:Patient Re-evaluated prior to inductionOxygen Delivery Method: Circle system utilized Preoxygenation: Pre-oxygenation with 100% oxygen Intubation Type: IV induction Ventilation: Mask ventilation without difficulty Laryngoscope Size: Mac and 3 Grade View: Grade II Tube type: Oral Tube size: 7.0 mm Number of attempts: 1 Airway Equipment and Method: Stylet Placement Confirmation: ETT inserted through vocal cords under direct vision,  positive ETCO2 and breath sounds checked- equal and bilateral Secured at: 22 cm Tube secured with: Tape Dental Injury: Teeth and Oropharynx as per pre-operative assessment

## 2013-12-24 NOTE — Progress Notes (Signed)
Patient arrived to unit via bed, alert and oriented, vitals taken and stable. Patient oriented to unit, call bell within reach and bed alarm set. Courtney [name]

## 2013-12-24 NOTE — Anesthesia Postprocedure Evaluation (Signed)
  Anesthesia Post-op Note  Patient: Courtney [name]  Procedure(s) Performed: Procedure(s) with comments: RIGHT LUMBAR THREE TO FOUR, LUMBAR FOUR TO FIVE, LUMBAR FIVE TO SACRAL ONE LAMINECTOMY/FORAMINOTOMY (Right) - RIGHT L3-4 L4-5 L5-S1 LAMINECTOMY/FORAMINOTOMY  Patient Location: PACU  Anesthesia Type:General  Level of Consciousness: awake, alert , oriented and patient cooperative  Airway and Oxygen Therapy: Patient Spontanous Breathing  Post-op Pain: mild  Post-op Assessment: Post-op Vital signs reviewed, Patient's Cardiovascular Status Stable, Respiratory Function Stable, Patent Airway, No signs of Nausea or vomiting and Pain level controlled  Post-op Vital Signs: stable  Last Vitals:  Filed Vitals:   12/24/13 1730  BP:   Pulse: 105  Temp:   Resp: 12    Complications: No apparent anesthesia complications

## 2013-12-24 NOTE — Transfer of Care (Signed)
Immediate Anesthesia Transfer of Care Note  Patient: Courtney [name]  Procedure(s) Performed: Procedure(s) with comments: RIGHT LUMBAR THREE TO FOUR, LUMBAR FOUR TO FIVE, LUMBAR FIVE TO SACRAL ONE LAMINECTOMY/FORAMINOTOMY (Right) - RIGHT L3-4 L4-5 L5-S1 LAMINECTOMY/FORAMINOTOMY  Patient Location: PACU  Anesthesia Type:General  Level of Consciousness: awake, alert  and oriented  Airway & Oxygen Therapy: Patient Spontanous Breathing and Patient connected to nasal cannula oxygen  Post-op Assessment: Report given to PACU RN and Patient moving all extremities X 4  Post vital signs: Reviewed and stable  Complications: No apparent anesthesia complications

## 2013-12-24 NOTE — Anesthesia Preprocedure Evaluation (Addendum)
Anesthesia Evaluation  Patient identified by MRN, date of birth, ID band Patient awake    Reviewed: Allergy & Precautions, H&P , NPO status , Patient's Chart, lab work & pertinent test results  History of Anesthesia Complications (+) PONV  Airway Mallampati: II   Neck ROM: Full    Dental  (+) Edentulous Upper, Partial Lower, Dental Advisory Given   Pulmonary sleep apnea ,  breath sounds clear to auscultation        Cardiovascular hypertension, Rhythm:Regular Rate:Normal  Denies cardiac issues of recent   Neuro/Psych    GI/Hepatic GERD-  ,  Endo/Other  diabetes, Well ControlledMorbid obesity  Renal/GU      Musculoskeletal  (+) Arthritis -,   Abdominal (+) + obese,   Peds  Hematology   Anesthesia Other Findings   Reproductive/Obstetrics                            Anesthesia Physical Anesthesia Plan  ASA: III  Anesthesia Plan: General   Post-op Pain Management:    Induction: Intravenous  Airway Management Planned: Oral ETT  Additional Equipment:   Intra-op Plan:   Post-operative Plan: Extubation in OR  Informed Consent: I have reviewed the patients History and Physical, chart, labs and discussed the procedure including the risks, benefits and alternatives for the proposed anesthesia with the patient or authorized representative who has indicated his/her understanding and acceptance.   Dental advisory given  Plan Discussed with: CRNA and Surgeon  Anesthesia Plan Comments:         Anesthesia Quick Evaluation

## 2013-12-25 LAB — GLUCOSE, CAPILLARY
GLUCOSE-CAPILLARY: 308 mg/dL — AB (ref 70–99)
Glucose-Capillary: 327 mg/dL — ABNORMAL HIGH (ref 70–99)
Glucose-Capillary: 358 mg/dL — ABNORMAL HIGH (ref 70–99)
Glucose-Capillary: 370 mg/dL — ABNORMAL HIGH (ref 70–99)

## 2013-12-25 MED ORDER — INFLUENZA VAC SPLIT QUAD 0.5 ML IM SUSY
0.5000 mL | PREFILLED_SYRINGE | INTRAMUSCULAR | Status: AC
  Administered 2013-12-26: 0.5 mL via INTRAMUSCULAR
  Filled 2013-12-25: qty 0.5

## 2013-12-25 NOTE — Progress Notes (Signed)
Dressing to surgical site changed at this time clean, dry and intact. Staples intact. Small amount of drainage noted. Will continue to monitor. Pt denies pain or discomfort at this time.

## 2013-12-25 NOTE — Progress Notes (Signed)
Occupational Therapy Evaluation Patient Details Name: Courtney [name] MRN: [id number] DOB: 12/06/1940 Today's Date: 12/25/2013    History of Present Illness 73 yo female s/p L3-4 L4-5 5-s1 laminectomy PMH: DM2, RA, HTN, OSA, CPAP, GERD, arthritis, CA skin, eczema, sleep apnea, cataract surg, R shoulder surg, bil carpal tunnel, bil eye lid surg, foot R surg   Clinical Impression   PTA pt lived at home with her husband and was independent with use of rollator for ADLs and functional mobility. Pt requires (A) for LB ADLs due to back precautions and pain currently and would benefit from acute OT to address LB ADLs and functional mobility. Pt plans to d/c home tomorrow with assistance from her husband.     Follow Up Recommendations  No OT follow up;Supervision/Assistance - 24 hour    Equipment Recommendations  3 in 1 bedside comode    Recommendations for Other Services       Precautions / Restrictions Precautions Precautions: Back Precaution Booklet Issued: Yes (comment) Precaution Comments: Educated pt on 3/3 back precautions and incorporating into ADLs.  Restrictions Weight Bearing Restrictions: No      Mobility Bed Mobility Overal bed mobility: Needs Assistance Bed Mobility: Sit to Sidelying;Rolling Rolling: Supervision       Sit to sidelying: Supervision General bed mobility comments: VC's for sequencing. Supervision for precautions.   Transfers Overall transfer level: Needs assistance Equipment used: Rolling walker (2 wheeled) Transfers: Sit to/from Stand Sit to Stand: Supervision         General transfer comment: Supervision for safety. Pt stood from recliner and BSC    Balance Overall balance assessment: Needs assistance Sitting-balance support: No upper extremity supported;Feet supported Sitting balance-Leahy Scale: Good     Standing balance support: No upper extremity supported;During functional activity Standing balance-Leahy Scale: Fair Standing  balance comment: Tolerated dynamic standing - washing hands without UE support for short time; requires BUE support for distance.                            ADL Overall ADL's : Needs assistance/impaired Eating/Feeding: Independent;Sitting   Grooming: Wash/dry hands;Oral care;Supervision/safety;Standing   Upper Body Bathing: Sitting;Set up   Lower Body Bathing: Sit to/from stand;Minimal assistance   Upper Body Dressing : Sitting;Set up   Lower Body Dressing: Sit to/from stand;Moderate assistance   Toilet Transfer: Ambulation;RW;Supervision/safety;Comfort height toilet   Toileting- Clothing Manipulation and Hygiene: Supervision/safety;Sit to/from Nurse, children's Details (indicate cue type and reason): Educated pt on safe shower transfer Functional mobility during ADLs: Supervision/safety;Rolling walker General ADL Comments: Pt moving very well and expressed satisfaction with her mobility today. Educated pt on incorporating back precautions into ADLs, fall prevention, and energy conservation.      Vision  Pt reports no change from baseline.                    Perception Perception Perception Tested?: No   Praxis Praxis Praxis tested?: Within functional limits    Pertinent Vitals/Pain Pain Assessment: No/denies pain     Hand Dominance     Extremity/Trunk Assessment Upper Extremity Assessment Upper Extremity Assessment: Overall WFL for tasks assessed   Lower Extremity Assessment Lower Extremity Assessment: Overall WFL for tasks assessed   Cervical / Trunk Assessment Cervical / Trunk Assessment: Kyphotic   Communication Communication Communication: No difficulties   Cognition Arousal/Alertness: Awake/alert Behavior During Therapy: WFL for tasks assessed/performed Overall Cognitive Status: Within Functional Limits for  tasks assessed                                Home Living Family/patient expects to be discharged to::  Private residence Living Arrangements: Spouse/significant other Available Help at Discharge: Family;Available 24 hours/day Type of Home: House Home Access: Stairs to enter [employer] of Steps: 1   Home Layout: Able to live on main level with bedroom/bathroom;Two level     Bathroom Shower/Tub: Walk-in shower;Door   Bathroom Toilet: Handicapped height     Home Equipment: Environmental consultant - 4 wheels;Shower seat - built in;Toilet riser;Grab bars - tub/shower          Prior Functioning/Environment Level of Independence: Independent with assistive device(s)        Comments: Pt using rollator prior to admission. Husband cooks/cleans. Pt (I) with ADLS.    OT Diagnosis: Acute pain   OT Problem List: Decreased strength;Decreased range of motion;Decreased activity tolerance;Impaired balance (sitting and/or standing);Decreased knowledge of use of DME or AE;Decreased knowledge of precautions;Pain   OT Treatment/Interventions: Self-care/ADL training;Therapeutic exercise;Energy conservation;DME and/or AE instruction;Therapeutic activities;Patient/family education;Balance training    OT Goals(Current goals can be found in the care plan section) Acute Rehab OT Goals Patient Stated Goal: to go home to my husband tomorrow OT Goal Formulation: With patient Time For Goal Achievement: 01/08/14 Potential to Achieve Goals: Good  OT Frequency: Min 2X/week    End of Session Equipment Utilized During Treatment: Gait belt;Rolling walker  Activity Tolerance: Patient tolerated treatment well Patient left: in bed;with call bell/phone within reach   Time: 8366-2947 OT Time Calculation (min): 27 min Charges:  OT General Charges $OT Visit: 1 Procedure OT Evaluation $Initial OT Evaluation Tier I: 1 Procedure OT Treatments $Self Care/Home Management : 8-22 mins  Villa Herb M 12/25/2013, 2:34 PM   Cyndie Chime, OTR/L Occupational Therapist [phone] (pager)

## 2013-12-25 NOTE — Progress Notes (Signed)
Inpatient Diabetes Program Recommendations  AACE/ADA: New Consensus Statement on Inpatient Glycemic Control (2013)  Target Ranges:  Prepandial:   less than 140 mg/dL      Peak postprandial:   less than 180 mg/dL (1-2 hours)      Critically ill patients:  140 - 180 mg/dL   Reason for Assessment:  Results for SIGNA, CHEEK (MRN [id number]) as of 12/25/2013 12:11  Ref. Range 12/24/2013 10:30 12/24/2013 17:22 12/24/2013 21:14 12/25/2013 06:32 12/25/2013 11:34  Glucose-Capillary Latest Range: 70-99 mg/dL 213 (H) 192 (H) 283 (H) 327 (H) 358 (H)   Diabetes history: Type 2 diabetes Outpatient Diabetes medications: NPH 50 units bid, Regular insulin 20 units bid Current orders for Inpatient glycemic control:  Novolog resistant tid with meals  MD please restart patient's home dose of NPH and consider adding Novolog 8 units tid with meals (hold if patient eats less than 50%).  Thanks, Adah Perl, RN, BC-ADM Inpatient Diabetes Coordinator Pager [phone]

## 2013-12-25 NOTE — Progress Notes (Signed)
Pt was able to ambulate with this nurse from bed to chair to sit up for meals during this shift with rolling walker and standby assist. Gait steady. Will continue to monitor.

## 2013-12-25 NOTE — Progress Notes (Signed)
Patient ID: Courtney [name], female   DOB: May 26, 1940, 73 y.o.   MRN: [id number] Out of bed. No leg oain ir weakness. Some drainage. Home in am

## 2013-12-25 NOTE — Evaluation (Signed)
Physical Therapy Evaluation Patient Details Name: Courtney [name] MRN: [id number] DOB: May 28, 1940 Today's Date: 12/25/2013   History of Present Illness  73 yo female s/p L3-4 L4-5 5-s1 laminectomy PMH: DM2, RA, HTN, OSA, CPAP, GERD, arthritis, CA skin, eczema, sleep apnea, cataract surg, R shoulder surg, bil carpal tunnel, bil eye lid surg, foot R surg    Clinical Impression  Patient presents with functional limitations due to deficits listed in PT problem list (see below). Pt tolerated transfers and ambulation with Min guard assist for safety. Education provided on back precautions and pt only able to verbalize 1/3precautions at end of session. Pt would benefit from skilled PT to improve transfers, gait, endurance and balance so pt can maximize independence and return to PLOF.    Follow Up Recommendations Home health PT;Supervision - Intermittent    Equipment Recommendations  None recommended by PT    Recommendations for Other Services       Precautions / Restrictions Precautions Precautions: Back Precaution Booklet Issued: Yes (comment) Precaution Comments: Reviewed back precautions and handout. Restrictions Weight Bearing Restrictions: No      Mobility  Bed Mobility               General bed mobility comments: Received sitting in toilet upon PT arrival.   Transfers Overall transfer level: Needs assistance Equipment used: Rolling walker (2 wheeled) Transfers: Sit to/from Stand Sit to Stand: Min guard         General transfer comment: Min guard for safety. Cues for hand placement. Stood from toilet x1, from EOB x1.  Ambulation/Gait Ambulation/Gait assistance: Min guard Ambulation Distance (Feet): 75 Feet Assistive device: Rolling walker (2 wheeled) Gait Pattern/deviations: Step-through pattern;Decreased stride length Gait velocity: slow Gait velocity interpretation: Below normal speed for age/gender General Gait Details: Pt with slow, mildly steady gait.  Dyspnea on exertion. HR and Sa02 stable. Ambulated on RA.   Stairs            Wheelchair Mobility    Modified Rankin (Stroke Patients Only)       Balance Overall balance assessment: Needs assistance Sitting-balance support: Feet supported;No upper extremity supported Sitting balance-Leahy Scale: Good     Standing balance support: During functional activity Standing balance-Leahy Scale: Fair Standing balance comment: Tolerated dynamic standing - washing hands without UE support for short time; requires BUE support for distance.                             Pertinent Vitals/Pain Pain Assessment: 0-10 Pain Score:  (not rated on pain scale. ) Pain Location: back at surgical site Pain Descriptors / Indicators: Sore Pain Intervention(s): Monitored during session;Repositioned    Home Living Family/patient expects to be discharged to:: Private residence Living Arrangements: Spouse/significant other   Type of Home: House Home Access: Stairs to enter   Technical brewer of Steps: 1 Home Layout: Able to live on main level with bedroom/bathroom;Two level Home Equipment: Walker - 4 wheels;Shower seat - built in;Toilet riser      Prior Function Level of Independence: Independent with assistive device(s)         Comments: Pt using rollator prior to admission. Husband cooks/cleans. Pt (I) with ADLS.     Hand Dominance        Extremity/Trunk Assessment   Upper Extremity Assessment: Defer to OT evaluation;Overall [facility] for tasks assessed           Lower Extremity Assessment: Overall WFL for tasks assessed (  MIld general weakness noted in RLE)      Cervical / Trunk Assessment: Kyphotic  Communication   Communication: No difficulties  Cognition Arousal/Alertness: Awake/alert Behavior During Therapy: WFL for tasks assessed/performed Overall Cognitive Status: Within Functional Limits for tasks assessed       Memory: Decreased recall of  precautions              General Comments      Exercises        Assessment/Plan    PT Assessment Patient needs continued PT services  PT Diagnosis Difficulty walking;Acute pain   PT Problem List Decreased strength;Decreased activity tolerance;Decreased knowledge of precautions;Decreased balance;Decreased mobility  PT Treatment Interventions Balance training;Gait training;Neuromuscular re-education;Stair training;Patient/family education;Functional mobility training;Therapeutic activities;Therapeutic exercise   PT Goals (Current goals can be found in the Care Plan section) Acute Rehab PT Goals Patient Stated Goal: to return home PT Goal Formulation: With patient Time For Goal Achievement: 01/08/14 Potential to Achieve Goals: Good    Frequency Min 5X/week   Barriers to discharge        Co-evaluation               End of Session Equipment Utilized During Treatment: Gait belt Activity Tolerance: Patient tolerated treatment well Patient left: in chair;with call bell/phone within reach;with family/visitor present           Time: 0924-0950 PT Time Calculation (min) (ACUTE ONLY): 26 min   Charges:   PT Evaluation $Initial PT Evaluation Tier I: 1 Procedure PT Treatments $Gait Training: 8-22 mins   PT G CodesCandy Sledge A 12/25/2013, 10:30 AM  Candy Sledge, [place], DPT [phone]

## 2013-12-25 NOTE — Progress Notes (Signed)
Utilization review completed. Sonal Dorwart, RN, BSN. 

## 2013-12-25 NOTE — Op Note (Signed)
[name], [name]               ACCOUNT NO.:  [id number]  MEDICAL RECORD NO.:  [id number]  LOCATION:  [id number]                        FACILITY:  [facility]  PHYSICIAN:  Leeroy Cha, M.D.   DATE OF BIRTH:  [birth year]  DATE OF PROCEDURE:  12/24/2013 DATE OF DISCHARGE:                              OPERATIVE REPORT   PREOPERATIVE DIAGNOSIS:  Lumbar degenerative disk disease with right side radiculopathy secondary to spondylosis and stenosis at the L3-4, 4- 4, 5-1.  POSTOPERATIVE DIAGNOSIS:  Lumbar degenerative disk disease with right side radiculopathy secondary to spondylosis and stenosis at the L3-4, 4- 4, 5-1.  PROCEDURE:  Right L4 and L5 laminectomy, laminotomy of L3 and S1, decompression of the thecal sac as well as the right L3, L4, L5, and S1 nerve root.  Microscope.  SURGEON:  Leeroy Cha, M.D.  CLINICAL HISTORY:  The patient came to my office complaining of back pain worsened to the right leg which is no better.  X-rays, stenosis worse at the level of 3-4, 4-5, 5-1.  She denies any pain in the left leg.  She decided to go ahead with the surgery because the pain was unbearable.  She and her family know the benefits and risks.  PROCEDURE:  The patient was taken to the OR.  After intubation, she was positioned in a prone manner.  The back was cleaned with DuraPrep. Drapes were applied.  Midline incision was made.  It was difficult to feel any bony structure.  The dissection was carried down to the spine and retraction was done laterally.  First, the initial x-rays showed that indeed we were right at the level of L4, L5.  With the help of the microscope, we started drilling the lamina of L4 and L5 until we were able to complete laminectomy.  We did a laminotomy of L3 and the upper of S1.  Then, a free of thick calcified yellow ligament was removed.  We are still working away from the top down with decompression of the L3 nerve root, the foraminotomy, also decompression of  the L4.  The worst was the L5 nerve root which was quite tight.  Decompression of the S1 nerve root was achieved.  We investigated the disk and there were mostly bulging but there was no evidence of any herniated disk.  The area was irrigated.  The wound was closed in different layers with Vicryl and staples.          ______________________________ Leeroy Cha, M.D.     EB/MEDQ  D:  12/24/2013  T:  12/25/2013  Job:  [id number]

## 2013-12-26 DIAGNOSIS — M5116 Intervertebral disc disorders with radiculopathy, lumbar region: Secondary | ICD-10-CM | POA: Diagnosis not present

## 2013-12-26 LAB — GLUCOSE, CAPILLARY: Glucose-Capillary: 293 mg/dL — ABNORMAL HIGH (ref 70–99)

## 2013-12-26 NOTE — Progress Notes (Addendum)
CARE MANAGEMENT NOTE 12/26/2013  Patient:  Courtney [name], Courtney [name]   Account Number:  [id number]  Date Initiated:  12/26/2013  Documentation initiated by:  [facility]  Subjective/Objective Assessment:   decompression lumbar right l3 to s1     Action/Plan:   Anticipated DC Date:  12/26/2013   Anticipated DC Plan:  [place]  CM consult      Choice offered to / List presented to:     DME arranged  3-N-1      DME agency  [facility].        Status of service:  Completed, signed off Medicare Important Message given?  YES (If response is "NO", the following Medicare IM given date fields will be blank) Date Medicare IM given:  12/26/2013 Medicare IM given by:  [facility] Date Additional Medicare IM given:   Additional Medicare IM given by:    Discharge Disposition:  HOME/SELF CARE  Per UR Regulation:    If discussed at Long Length of Stay Meetings, dates discussed:    Comments:  12/26/2013 1230  NCM spoke to pt and requested 3n1 for home. Contacted [facility] for 3n1 for home. Has RW at home.  Pt states no HH needed. Jonnie Finner RN CCM Case Mgmt phone [phone]

## 2013-12-26 NOTE — Progress Notes (Signed)
Discharge instructions discussed with pt. Waiting on equipment. Will continue to monitor pt.

## 2013-12-26 NOTE — Discharge Summary (Signed)
Physician Discharge Summary  Patient ID: Courtney [name] MRN: [id number] DOB/AGE: December 30, 1940 73 y.o.  Admit date: 12/24/2013 Discharge date: 12/26/2013  Admission Diagnoses:lumbar degenerative disc disease  Discharge Diagnoses:  Active Problems:   Lumbar degenerative disc disease   Discharged Condition: no pain or weakness  Hospital Course: surgery  Consults: none  Significant Diagnostic Studies: mri  Treatments: decompression lumbar right l3 to s1  Discharge Exam: Blood pressure 125/43, pulse 80, temperature 98.7 F (37.1 C), temperature source Oral, resp. rate 17, weight 99.791 kg (220 lb), SpO2 98 %. No weakness. ambulating  Disposition: home     Medication List    ASK your doctor about these medications        aspirin EC 81 MG tablet  Take 81 mg by mouth daily.     bisoprolol-hydrochlorothiazide 2.5-6.25 MG per tablet  Commonly known as:  ZIAC  Take 2 tablets by mouth daily.     doxazosin 8 MG tablet  Commonly known as:  CARDURA  Take 8 mg by mouth daily.     hydrochlorothiazide 25 MG tablet  Commonly known as:  HYDRODIURIL  Take 25 mg by mouth daily.     inFLIXimab 100 MG injection  Commonly known as:  REMICADE  Inject 500 mg into the vein every 6 (six) weeks.     insulin NPH Human 100 UNIT/ML injection  Commonly known as:  HUMULIN N,NOVOLIN N  Inject 50 Units into the skin 2 (two) times daily.     insulin regular 100 units/mL injection  Commonly known as:  NOVOLIN R,HUMULIN R  Inject 20 Units into the skin 2 (two) times daily.     leflunomide 10 MG tablet  Commonly known as:  ARAVA  Take 10 mg by mouth daily.     lisinopril 40 MG tablet  Commonly known as:  PRINIVIL,ZESTRIL  Take 40 mg by mouth daily.     omeprazole 20 MG capsule  Commonly known as:  PRILOSEC  Take 20 mg by mouth daily.     ranitidine 150 MG tablet  Commonly known as:  ZANTAC  Take 150 mg by mouth 2 (two) times daily.     simvastatin 40 MG tablet  Commonly known  as:  ZOCOR  Take 40 mg by mouth daily.         Signed: Floyce Stakes 12/26/2013, 10:46 AM

## 2013-12-26 NOTE — Progress Notes (Signed)
Occupational Therapy Treatment Patient Details Name: Courtney [name] MRN: [id number] DOB: [birth year] Today's Date: 12/26/2013    History of present illness 73 yo female s/p L3-4 L4-5 5-s1 laminectomy PMH: DM2, RA, HTN, OSA, CPAP, GERD, arthritis, CA skin, eczema, sleep apnea, cataract surg, R shoulder surg, bil carpal tunnel, bil eye lid surg, foot R surg   OT comments  Pt seen today for ADL session. Pt overall required Supervision/setup to dress self. Pt plans to d/c home today and will have assistance from her daughter. Pt is safe for d/c from OT standpoint.    Follow Up Recommendations  No OT follow up;Supervision/Assistance - 24 hour    Equipment Recommendations  3 in 1 bedside comode    Recommendations for Other Services      Precautions / Restrictions Precautions Precautions: Back Precaution Comments: reinforced education on back precautions.  Restrictions Weight Bearing Restrictions: No       Mobility Bed Mobility               General bed mobility comments: Pt sitting up in recliner when OT arrived.   Transfers Overall transfer level: Needs assistance Equipment used: Rolling walker (2 wheeled) Transfers: Sit to/from Stand Sit to Stand: Supervision         General transfer comment: Supervision for safety.         ADL Overall ADL's : Needs assistance/impaired                                       General ADL Comments: Pt overall required supervision/setup for ADLs and Functional mobility. Pt safely donned LB and UB clothing and ambulated in room with RW without VC's needed. Pt plans to d/c home today.                 Cognition  Arousal/Alertness: Awake/Alert Behavior During Therapy: WFL for tasks assessed/performed Overall Cognitive Status: Within Functional Limits for tasks assessed                                    Pertinent Vitals/ Pain       Pain Assessment: 0-10 Pain Score: 2  Pain Location: back Pain  Descriptors / Indicators: Sore Pain Intervention(s): Monitored during session;Repositioned         Frequency Min 2X/week     Progress Toward Goals  OT Goals(current goals can now be found in the care plan section)  Progress towards OT goals: Progressing toward goals  Acute Rehab OT Goals Patient Stated Goal: home today  Plan Discharge plan remains appropriate       End of Session Equipment Utilized During Treatment: Gait belt;Rolling walker   Activity Tolerance Patient tolerated treatment well   Patient Left in chair;with call bell/phone within reach   Nurse Communication          Time: 1107-1130 OT Time Calculation (min): 23 min  Charges: OT General Charges $OT Visit: 1 Procedure OT Treatments $Self Care/Home Management : 23-37 mins  Juluis Rainier 12/26/2013, 2:43 PM  Cyndie Chime, OTR/L Occupational Therapist [phone] (pager)

## 2013-12-26 NOTE — Progress Notes (Signed)
Discharge orders received. Pt for discharge home today. IV d/c'd. Dressing clean, dry, intact to lower back. Pt given discharge instructions and prescriptions with verbalized understanding. Family in room to assist with discharge. Staff brought pt downstairs via wheelchair.

## 2013-12-29 ENCOUNTER — Encounter (HOSPITAL_COMMUNITY): Payer: Self-pay | Admitting: Neurosurgery

## 2014-06-25 DIAGNOSIS — M5137 Other intervertebral disc degeneration, lumbosacral region: Secondary | ICD-10-CM | POA: Insufficient documentation

## 2014-07-02 ENCOUNTER — Encounter: Payer: Self-pay | Admitting: Pain Medicine

## 2014-07-02 ENCOUNTER — Ambulatory Visit: Payer: Medicare Other | Attending: Pain Medicine | Admitting: Pain Medicine

## 2014-07-02 VITALS — BP 141/54 | HR 69 | Temp 97.4°F | Resp 16 | Ht 68.0 in | Wt 210.0 lb

## 2014-07-02 DIAGNOSIS — M79605 Pain in left leg: Secondary | ICD-10-CM | POA: Diagnosis present

## 2014-07-02 DIAGNOSIS — M545 Low back pain: Secondary | ICD-10-CM | POA: Diagnosis present

## 2014-07-02 DIAGNOSIS — M47816 Spondylosis without myelopathy or radiculopathy, lumbar region: Secondary | ICD-10-CM | POA: Insufficient documentation

## 2014-07-02 DIAGNOSIS — M533 Sacrococcygeal disorders, not elsewhere classified: Secondary | ICD-10-CM | POA: Diagnosis not present

## 2014-07-02 DIAGNOSIS — M5136 Other intervertebral disc degeneration, lumbar region: Secondary | ICD-10-CM | POA: Insufficient documentation

## 2014-07-02 DIAGNOSIS — Z9889 Other specified postprocedural states: Secondary | ICD-10-CM

## 2014-07-02 DIAGNOSIS — M79604 Pain in right leg: Secondary | ICD-10-CM | POA: Diagnosis present

## 2014-07-02 NOTE — Progress Notes (Signed)
Safety precautions to be maintained throughout the outpatient stay will include: orient to surroundings, keep bed in low position, maintain call bell within reach at all times, provide assistance with transfer out of bed and ambulation.  1215 Patient discharged to home with husband. Pre procedure instructions given. Patient to call Dr. Sabra Heck and get clearance to stop ASA 5 days prior to procedure.    Bethann Humble RN

## 2014-07-02 NOTE — Progress Notes (Signed)
   Subjective:    Patient ID: Courtney [name], female    DOB: 1940/01/24, 74 y.o.   MRN: [id number]  HPI  Patient is 74 year old female returns to [place] for further evaluation and treatment of pain involving the lower back and lower extremity region. Patient is status post surgical intervention of the lumbar region by Dr. Posey Pronto. Patient is with pain involving the lumbar lower extremity region with radiation of pain for the lower extremity on the left continuing to about the level of the knee. Twisting and turning maneuvers aggravates patient's pain significantly. We have discussed patient's condition and will proceed with interventional treatment at time return appointment consisting of lumbar facet, medial branch nerve, blocks and attempt to decrease severity of patient's symptoms, minimize progression of patient's symptoms, and hopefully avoid the need for more involved treatment. The patient was understanding and agree with suggested treatment plan.    Review of Systems     Objective:   Physical Exam  There was mild tends to palpation of the splenius capitis and occipitalis musculature regions. Palpation of the cervical facet and thoracic facet paraspinal musculature regions were tends to palpation of mild degree. Patient appeared to be with unremarkable Spurling's maneuver. Patient appeared to be with bilaterally equal grip strength. Tinel and Phalen's maneuver were without increase of pain of significant degree. Palpation over the thoracic facet thoracic paraspinal musculature region with mild muscle spasms in the lower thoracic paraspinal musculature region. Palpation over the lumbar paraspinal musculature region lumbar facet region was with moderate moderately severe tenderness to palpation left compared to the right. Straight leg raising was tolerates approximately 20 without increase of pain noted with dorsiflexion noted there was negative clonus negative Homans. Palpation  of the PSIS and PII S region reproduced pain on the left greater than the right and a moderate degree. Straight leg raising was tolerates approximately 20 without increase of pain with dorsiflexion noted. Minimal tenderness of the greater trochanteric region iliotibial band region. Abdomen on tenderness and no costovertebral angle tenderness noted.    Assessment & Plan:  DDD lumbar surgical  Surgical probe posterior to the L3 vertebral body level, wish additional probes at L4-5 and L5-S1  Lumbar facet syndrome  Sacroiliac joint dysfunction    Plan.  Continue present medications  Lumbar facet, medial branch nerve, blocks to be performed at time return appointment.  F/U PCP for evaliation of  BP and general medical  condition.  F/U surgical evaluation.  F/U neurological evaluation.  May consider radiofrequency rhizolysis or intraspinal procedures pending response to present treatment and F/U evaluation.  Patient to call [facility] should patient have concerns prior to scheduled return appointment.

## 2014-07-02 NOTE — Patient Instructions (Addendum)
Continue present medications.  Lumbar facet, medial branch nerve, blocks to be performed 07/20/2014  F/U PCP for evaliation of  BP and general medical  condition. We need to address Dr. Sabra Heck if you can stop your aspirin for 5 days prior to having the procedure  F/U surgical evaluation.  F/U neurological evaluation.  May consider radiofrequency rhizolysis or intraspinal procedures pending response to present treatment and F/U evaluation.  Patient to call [facility] should patient have concerns prior to scheduled return appointment. Pain Management Discharge Instructions  General Discharge Instructions :  If you need to reach your doctor call: Monday-Friday 8:00 am - 4:00 pm at [phone] or toll free [phone].  After clinic hours [phone] to have operator reach doctor.  Bring all of your medication bottles to all your appointments in the pain clinic.  To cancel or reschedule your appointment with Pain Management please remember to call 24 hours in advance to avoid a fee.  Refer to the educational materials which you have been given on: General Risks, I had my Procedure. Discharge Instructions, Post Sedation.  Post Procedure Instructions:  The drugs you were given will stay in your system until tomorrow, so for the next 24 hours you should not drive, make any legal decisions or drink any alcoholic beverages.  You may eat anything you prefer, but it is better to start with liquids then soups and crackers, and gradually work up to solid foods.  Please notify your doctor immediately if you have any unusual bleeding, trouble breathing or pain that is not related to your normal pain.  Depending on the type of procedure that was done, some parts of your body may feel week and/or numb.  This usually clears up by tonight or the next day.  Walk with the use of an assistive device or accompanied by an adult for the 24 hours.  You may use ice on the affected area for  the first 24 hours.  Put ice in a Ziploc bag and cover with a towel and place against area 15 minutes on 15 minutes off.  You may switch to heat after 24 hours.GENERAL RISKS AND COMPLICATIONS  What are the risk, side effects and possible complications? Generally speaking, most procedures are safe.  However, with any procedure there are risks, side effects, and the possibility of complications.  The risks and complications are dependent upon the sites that are lesioned, or the type of nerve block to be performed.  The closer the procedure is to the spine, the more serious the risks are.  Great care is taken when placing the radio frequency needles, block needles or lesioning probes, but sometimes complications can occur. 1. Infection: Any time there is an injection through the skin, there is a risk of infection.  This is why sterile conditions are used for these blocks.  There are four possible types of infection. 1. Localized skin infection. 2. Central Nervous System Infection-This can be in the form of Meningitis, which can be deadly. 3. Epidural Infections-This can be in the form of an epidural abscess, which can cause pressure inside of the spine, causing compression of the spinal cord with subsequent paralysis. This would require an emergency surgery to decompress, and there are no guarantees that the patient would recover from the paralysis. 4. Discitis-This is an infection of the intervertebral discs.  It occurs in about 1% of discography procedures.  It is difficult to treat and it may lead to surgery.  2. Pain: the needles have to go through skin and soft tissues, will cause soreness.       3. Damage to internal structures:  The nerves to be lesioned may be near blood vessels or    other nerves which can be potentially damaged.       4. Bleeding: Bleeding is more common if the patient is taking blood thinners such as  aspirin, Coumadin, Ticiid, Plavix, etc., or if he/she have some genetic  predisposition  such as hemophilia. Bleeding into the spinal canal can cause compression of the spinal  cord with subsequent paralysis.  This would require an emergency surgery to  decompress and there are no guarantees that the patient would recover from the  paralysis.       5. Pneumothorax:  Puncturing of a lung is a possibility, every time a needle is introduced in  the area of the chest or upper back.  Pneumothorax refers to free air around the  collapsed lung(s), inside of the thoracic cavity (chest cavity).  Another two possible  complications related to a similar event would include: Hemothorax and Chylothorax.   These are variations of the Pneumothorax, where instead of air around the collapsed  lung(s), you may have blood or chyle, respectively.       6. Spinal headaches: They may occur with any procedures in the area of the spine.       7. Persistent CSF (Cerebro-Spinal Fluid) leakage: This is a rare problem, but may occur  with prolonged intrathecal or epidural catheters either due to the formation of a fistulous  track or a dural tear.       8. Nerve damage: By working so close to the spinal cord, there is always a possibility of  nerve damage, which could be as serious as a permanent spinal cord injury with  paralysis.       9. Death:  Although rare, severe deadly allergic reactions known as "Anaphylactic  reaction" can occur to any of the medications used.      10. Worsening of the symptoms:  We can always make thing worse.  What are the chances of something like this happening? Chances of any of this occuring are extremely low.  By statistics, you have more of a chance of getting killed in a motor vehicle accident: while driving to the hospital than any of the above occurring .  Nevertheless, you should be aware that they are possibilities.  In general, it is similar to taking a shower.  Everybody knows that you can slip, hit your head and get killed.  Does that mean that you should not  shower again?  Nevertheless always keep in mind that statistics do not mean anything if you happen to be on the wrong side of them.  Even if a procedure has a 1 (one) in a 1,000,000 (million) chance of going wrong, it you happen to be that one..Also, keep in mind that by statistics, you have more of a chance of having something go wrong when taking medications.  Who should not have this procedure? If you are on a blood thinning medication (e.g. Coumadin, Plavix, see list of "Blood Thinners"), or if you have an active infection going on, you should not have the procedure.  If you are taking any blood thinners, please inform your physician.  How should I prepare for this procedure?  Do not eat or drink anything at least six hours prior to the procedure.  Bring a driver  with you .  It cannot be a taxi.  Come accompanied by an adult that can drive you back, and that is strong enough to help you if your legs get weak or numb from the local anesthetic.  Take all of your medicines the morning of the procedure with just enough water to swallow them.  If you have diabetes, make sure that you are scheduled to have your procedure done first thing in the morning, whenever possible.  If you have diabetes, take only half of your insulin dose and notify our nurse that you have done so as soon as you arrive at the clinic.  If you are diabetic, but only take blood sugar pills (oral hypoglycemic), then do not take them on the morning of your procedure.  You may take them after you have had the procedure.  Do not take aspirin or any aspirin-containing medications, at least eleven (11) days prior to the procedure.  They may prolong bleeding.  Wear loose fitting clothing that may be easy to take off and that you would not mind if it got stained with Betadine or blood.  Do not wear any jewelry or perfume  Remove any nail coloring.  It will interfere with some of our monitoring equipment.  NOTE: Remember that  this is not meant to be interpreted as a complete list of all possible complications.  Unforeseen problems may occur.  BLOOD THINNERS The following drugs contain aspirin or other products, which can cause increased bleeding during surgery and should not be taken for 2 weeks prior to and 1 week after surgery.  If you should need take something for relief of minor pain, you may take acetaminophen which is found in Tylenol,m Datril, Anacin-3 and Panadol. It is not blood thinner. The products listed below are.  Do not take any of the products listed below in addition to any listed on your instruction sheet.  A.P.C or A.P.C with Codeine Codeine Phosphate Capsules #3 Ibuprofen Ridaura  ABC compound Congesprin Imuran rimadil  Advil Cope Indocin Robaxisal  Alka-Seltzer Effervescent Pain Reliever and Antacid Coricidin or Coricidin-D  Indomethacin Rufen  Alka-Seltzer plus Cold Medicine Cosprin Ketoprofen S-A-C Tablets  Anacin Analgesic Tablets or Capsules Coumadin Korlgesic Salflex  Anacin Extra Strength Analgesic tablets or capsules CP-2 Tablets Lanoril Salicylate  Anaprox Cuprimine Capsules Levenox Salocol  Anexsia-D Dalteparin Magan Salsalate  Anodynos Darvon compound Magnesium Salicylate Sine-off  Ansaid Dasin Capsules Magsal Sodium Salicylate  Anturane Depen Capsules Marnal Soma  APF Arthritis pain formula Dewitt's Pills Measurin Stanback  Argesic Dia-Gesic Meclofenamic Sulfinpyrazone  Arthritis Bayer Timed Release Aspirin Diclofenac Meclomen Sulindac  Arthritis pain formula Anacin Dicumarol Medipren Supac  Analgesic (Safety coated) Arthralgen Diffunasal Mefanamic Suprofen  Arthritis Strength Bufferin Dihydrocodeine Mepro Compound Suprol  Arthropan liquid Dopirydamole Methcarbomol with Aspirin Synalgos  ASA tablets/Enseals Disalcid Micrainin Tagament  Ascriptin Doan's Midol Talwin  Ascriptin A/D Dolene Mobidin Tanderil  Ascriptin Extra Strength Dolobid Moblgesic Ticlid  Ascriptin with Codeine  Doloprin or Doloprin with Codeine Momentum Tolectin  Asperbuf Duoprin Mono-gesic Trendar  Aspergum Duradyne Motrin or Motrin IB Triminicin  Aspirin plain, buffered or enteric coated Durasal Myochrisine Trigesic  Aspirin Suppositories Easprin Nalfon Trillsate  Aspirin with Codeine Ecotrin Regular or Extra Strength Naprosyn Uracel  Atromid-S Efficin Naproxen Ursinus  Auranofin Capsules Elmiron Neocylate Vanquish  Axotal Emagrin Norgesic Verin  Azathioprine Empirin or Empirin with Codeine Normiflo Vitamin E  Azolid Emprazil Nuprin Voltaren  Bayer Aspirin plain, buffered or children's or timed BC Tablets or powders  Encaprin Orgaran Warfarin Sodium  Buff-a-Comp Enoxaparin Orudis Zorpin  Buff-a-Comp with Codeine Equegesic Os-Cal-Gesic   Buffaprin Excedrin plain, buffered or Extra Strength Oxalid   Bufferin Arthritis Strength Feldene Oxphenbutazone   Bufferin plain or Extra Strength Feldene Capsules Oxycodone with Aspirin   Bufferin with Codeine Fenoprofen Fenoprofen Pabalate or Pabalate-SF   Buffets II Flogesic Panagesic   Buffinol plain or Extra Strength Florinal or Florinal with Codeine Panwarfarin   Buf-Tabs Flurbiprofen Penicillamine   Butalbital Compound Four-way cold tablets Penicillin   Butazolidin Fragmin Pepto-Bismol   Carbenicillin Geminisyn Percodan   Carna Arthritis Reliever Geopen Persantine   Carprofen Gold's salt Persistin   Chloramphenicol Goody's Phenylbutazone   Chloromycetin Haltrain Piroxlcam   Clmetidine heparin Plaquenil   Cllnoril Hyco-pap Ponstel   Clofibrate Hydroxy chloroquine Propoxyphen         Before stopping any of these medications, be sure to consult the physician who ordered them.  Some, such as Coumadin (Warfarin) are ordered to prevent or treat serious conditions such as "deep thrombosis", "pumonary embolisms", and other heart problems.  The amount of time that you may need off of the medication may also vary with the medication and the reason for which  you were taking it.  If you are taking any of these medications, please make sure you notify your pain physician before you undergo any procedures.         Facet Joint Block The facet joints connect the bones of the spine (vertebrae). They make it possible for you to bend, twist, and make other movements with your spine. They also prevent you from overbending, overtwisting, and making other excessive movements.  A facet joint block is a procedure where a numbing medicine (anesthetic) is injected into a facet joint. Often, a type of anti-inflammatory medicine called a steroid is also injected. A facet joint block may be done for two reasons:  2. Diagnosis. A facet joint block may be done as a test to see whether neck or back pain is caused by a worn-down or infected facet joint. If the pain gets better after a facet joint block, it means the pain is probably coming from the facet joint. If the pain does not get better, it means the pain is probably not coming from the facet joint.  3. Therapy. A facet joint block may be done to relieve neck or back pain caused by a facet joint. A facet joint block is only done as a therapy if the pain does not improve with medicine, exercise programs, physical therapy, and other forms of pain management. LET [facility] CARE PROVIDER KNOW ABOUT:   Any allergies you have.   All medicines you are taking, including vitamins, herbs, eyedrops, and over-the-counter medicines and creams.   Previous problems you or members of your family have had with the use of anesthetics.   Any blood disorders you have had.   Other health problems you have. RISKS AND COMPLICATIONS Generally, having a facet joint block is safe. However, as with any procedure, complications can occur. Possible complications associated with having a facet joint block include:   Bleeding.   Injury to a nerve near the injection site.   Pain at the injection site.   Weakness or numbness  in areas controlled by nerves near the injection site.   Infection.   Temporary fluid retention.   Allergic reaction to anesthetics or medicines used during the procedure. BEFORE THE PROCEDURE   Follow your health care provider's instructions if you are  taking dietary supplements or medicines. You may need to stop taking them or reduce your dosage.   Do not take any new dietary supplements or medicines without asking your health care provider first.   Follow your health care provider's instructions about eating and drinking before the procedure. You may need to stop eating and drinking several hours before the procedure.   Arrange to have an adult drive you home after the procedure. PROCEDURE 12. You may need to remove your clothing and dress in an open-back gown so that your health care provider can access your spine.  13. The procedure will be done while you are lying on an X-ray table. Most of the time you will be asked to lie on your stomach, but you may be asked to lie in a different position if an injection will be made in your neck.  14. Special machines will be used to monitor your oxygen levels, heart rate, and blood pressure.  15. If an injection will be made in your neck, an intravenous (IV) tube will be inserted into one of your veins. Fluids and medicine will flow directly into your body through the IV tube.  16. The area over the facet joint where the injection will be made will be cleaned with an antiseptic soap. The surrounding skin will be covered with sterile drapes.  17. An anesthetic will be applied to your skin to make the injection area numb. You may feel a temporary stinging or burning sensation.  18. A video X-ray machine will be used to locate the joint. A contrast dye may be injected into the facet joint area to help with locating the joint.  19. When the joint is located, an anesthetic medicine will be injected into the joint through the needle.   53. Your health care provider will ask you whether you feel pain relief. If you do feel relief, a steroid may be injected to provide pain relief for a longer period of time. If you do not feel relief or feel only partial relief, additional injections of an anesthetic may be made in other facet joints.  21. The needle will be removed, the skin will be cleansed, and bandages will be applied.  AFTER THE PROCEDURE   You will be observed for 15-30 minutes before being allowed to go home. Do not drive. Have an adult drive you or take a taxi or public transportation instead.   If you feel pain relief, the pain will return in several hours or days when the anesthetic wears off.   You may feel pain relief 2-14 days after the procedure. The amount of time this relief lasts varies from person to person.   It is normal to feel some tenderness over the injected area(s) for 2 days following the procedure.   If you have diabetes, you may have a temporary increase in blood sugar. Document Released: 05/17/2006 Document Revised: 05/12/2013 Document Reviewed: 10/16/2011 [facility] Patient Information 2015 [place], [place]. This information is not intended to replace advice given to you by your health care provider. Make sure you discuss any questions you have with your health care provider.

## 2014-07-07 ENCOUNTER — Other Ambulatory Visit: Payer: Self-pay | Admitting: Internal Medicine

## 2014-07-07 DIAGNOSIS — R921 Mammographic calcification found on diagnostic imaging of breast: Secondary | ICD-10-CM

## 2014-07-14 ENCOUNTER — Other Ambulatory Visit: Payer: Self-pay | Admitting: Internal Medicine

## 2014-07-14 ENCOUNTER — Ambulatory Visit: Payer: Medicare Other

## 2014-07-14 ENCOUNTER — Ambulatory Visit
Admission: RE | Admit: 2014-07-14 | Discharge: 2014-07-14 | Disposition: A | Discharge status: Home / Self Care | Payer: Medicare Other | Source: Ambulatory Visit | Attending: Internal Medicine | Admitting: Internal Medicine

## 2014-07-14 DIAGNOSIS — R921 Mammographic calcification found on diagnostic imaging of breast: Secondary | ICD-10-CM | POA: Diagnosis present

## 2014-07-14 DIAGNOSIS — R928 Other abnormal and inconclusive findings on diagnostic imaging of breast: Secondary | ICD-10-CM | POA: Diagnosis not present

## 2014-07-19 ENCOUNTER — Other Ambulatory Visit: Payer: Self-pay | Admitting: Pain Medicine

## 2014-07-20 ENCOUNTER — Ambulatory Visit: Payer: Medicare Other | Attending: Pain Medicine | Admitting: Pain Medicine

## 2014-07-20 ENCOUNTER — Encounter: Payer: Self-pay | Admitting: Pain Medicine

## 2014-07-20 VITALS — BP 118/46 | HR 76 | Temp 98.5°F | Resp 16 | Wt 200.0 lb

## 2014-07-20 DIAGNOSIS — M1288 Other specific arthropathies, not elsewhere classified, other specified site: Secondary | ICD-10-CM | POA: Diagnosis not present

## 2014-07-20 DIAGNOSIS — M533 Sacrococcygeal disorders, not elsewhere classified: Secondary | ICD-10-CM

## 2014-07-20 DIAGNOSIS — M545 Low back pain: Secondary | ICD-10-CM | POA: Diagnosis present

## 2014-07-20 DIAGNOSIS — M79604 Pain in right leg: Secondary | ICD-10-CM | POA: Diagnosis present

## 2014-07-20 DIAGNOSIS — M79605 Pain in left leg: Secondary | ICD-10-CM | POA: Diagnosis present

## 2014-07-20 DIAGNOSIS — M47816 Spondylosis without myelopathy or radiculopathy, lumbar region: Secondary | ICD-10-CM | POA: Diagnosis not present

## 2014-07-20 DIAGNOSIS — M5136 Other intervertebral disc degeneration, lumbar region: Secondary | ICD-10-CM

## 2014-07-20 MED ORDER — TRIAMCINOLONE ACETONIDE 40 MG/ML IJ SUSP
INTRAMUSCULAR | Status: AC
  Administered 2014-07-20: 10:00:00
  Filled 2014-07-20: qty 1

## 2014-07-20 MED ORDER — FENTANYL CITRATE (PF) 100 MCG/2ML IJ SOLN
INTRAMUSCULAR | Status: AC
  Administered 2014-07-20: 100 ug via INTRAVENOUS
  Filled 2014-07-20: qty 2

## 2014-07-20 MED ORDER — BUPIVACAINE HCL (PF) 0.25 % IJ SOLN
INTRAMUSCULAR | Status: AC
  Administered 2014-07-20: 10:00:00
  Filled 2014-07-20: qty 30

## 2014-07-20 MED ORDER — MIDAZOLAM HCL 5 MG/5ML IJ SOLN
INTRAMUSCULAR | Status: AC
  Administered 2014-07-20: 11:00:00
  Filled 2014-07-20: qty 5

## 2014-07-20 MED ORDER — ORPHENADRINE CITRATE 30 MG/ML IJ SOLN
INTRAMUSCULAR | Status: AC
  Administered 2014-07-20: 10:00:00
  Filled 2014-07-20: qty 2

## 2014-07-20 NOTE — Patient Instructions (Addendum)
Continue present medications  F/U PCP Dr. Emily Filbert evaliation of  BP and general medical  condition.. Be sure to see Dr. Sabra Heck this week regarding your blood pressure following steroid injection today  F/U surgical evaluation  F/U neurological evaluation  May consider radiofrequency rhizolysis or intraspinal procedures pending response to present treatment and F/U evaluation.  Patient to call Pain Management Center should patient have concerns prior to scheduled return appointment. Pain Management Discharge Instructions  General Discharge Instructions :  If you need to reach your doctor call: Monday-Friday 8:00 am - 4:00 pm at [phone] or toll free [phone].  After clinic hours [phone] to have operator reach doctor.  Bring all of your medication bottles to all your appointments in the pain clinic.  To cancel or reschedule your appointment with Pain Management please remember to call 24 hours in advance to avoid a fee.  Refer to the educational materials which you have been given on: General Risks, I had my Procedure. Discharge Instructions, Post Sedation.  Post Procedure Instructions:  The drugs you were given will stay in your system until tomorrow, so for the next 24 hours you should not drive, make any legal decisions or drink any alcoholic beverages.  You may eat anything you prefer, but it is better to start with liquids then soups and crackers, and gradually work up to solid foods.  Please notify your doctor immediately if you have any unusual bleeding, trouble breathing or pain that is not related to your normal pain.  Depending on the type of procedure that was done, some parts of your body may feel week and/or numb.  This usually clears up by tonight or the next day.  Walk with the use of an assistive device or accompanied by an adult for the 24 hours.  You may use ice on the affected area for the first 24 hours.  Put ice in a Ziploc bag and cover with a  towel and place against area 15 minutes on 15 minutes off.  You may switch to heat after 24 hours.Facet Joint Block The facet joints connect the bones of the spine (vertebrae). They make it possible for you to bend, twist, and make other movements with your spine. They also prevent you from overbending, overtwisting, and making other excessive movements.  A facet joint block is a procedure where a numbing medicine (anesthetic) is injected into a facet joint. Often, a type of anti-inflammatory medicine called a steroid is also injected. A facet joint block may be done for two reasons:   Diagnosis. A facet joint block may be done as a test to see whether neck or back pain is caused by a worn-down or infected facet joint. If the pain gets better after a facet joint block, it means the pain is probably coming from the facet joint. If the pain does not get better, it means the pain is probably not coming from the facet joint.   Therapy. A facet joint block may be done to relieve neck or back pain caused by a facet joint. A facet joint block is only done as a therapy if the pain does not improve with medicine, exercise programs, physical therapy, and other forms of pain management. LET [facility] CARE PROVIDER KNOW ABOUT:   Any allergies you have.   All medicines you are taking, including vitamins, herbs, eyedrops, and over-the-counter medicines and creams.   Previous problems you or members of your family have had with the use of anesthetics.  Any blood disorders you have had.   Other health problems you have. RISKS AND COMPLICATIONS Generally, having a facet joint block is safe. However, as with any procedure, complications can occur. Possible complications associated with having a facet joint block include:   Bleeding.   Injury to a nerve near the injection site.   Pain at the injection site.   Weakness or numbness in areas controlled by nerves near the injection site.    Infection.   Temporary fluid retention.   Allergic reaction to anesthetics or medicines used during the procedure. BEFORE THE PROCEDURE   Follow your health care provider's instructions if you are taking dietary supplements or medicines. You may need to stop taking them or reduce your dosage.   Do not take any new dietary supplements or medicines without asking your health care provider first.   Follow your health care provider's instructions about eating and drinking before the procedure. You may need to stop eating and drinking several hours before the procedure.   Arrange to have an adult drive you home after the procedure. PROCEDURE  You may need to remove your clothing and dress in an open-back gown so that your health care provider can access your spine.   The procedure will be done while you are lying on an X-ray table. Most of the time you will be asked to lie on your stomach, but you may be asked to lie in a different position if an injection will be made in your neck.   Special machines will be used to monitor your oxygen levels, heart rate, and blood pressure.   If an injection will be made in your neck, an intravenous (IV) tube will be inserted into one of your veins. Fluids and medicine will flow directly into your body through the IV tube.   The area over the facet joint where the injection will be made will be cleaned with an antiseptic soap. The surrounding skin will be covered with sterile drapes.   An anesthetic will be applied to your skin to make the injection area numb. You may feel a temporary stinging or burning sensation.   A video X-ray machine will be used to locate the joint. A contrast dye may be injected into the facet joint area to help with locating the joint.   When the joint is located, an anesthetic medicine will be injected into the joint through the needle.   Your health care provider will ask you whether you feel pain relief. If  you do feel relief, a steroid may be injected to provide pain relief for a longer period of time. If you do not feel relief or feel only partial relief, additional injections of an anesthetic may be made in other facet joints.   The needle will be removed, the skin will be cleansed, and bandages will be applied.  AFTER THE PROCEDURE   You will be observed for 15-30 minutes before being allowed to go home. Do not drive. Have an adult drive you or take a taxi or public transportation instead.   If you feel pain relief, the pain will return in several hours or days when the anesthetic wears off.   You may feel pain relief 2-14 days after the procedure. The amount of time this relief lasts varies from person to person.   It is normal to feel some tenderness over the injected area(s) for 2 days following the procedure.   If you have diabetes, you may have a  temporary increase in blood sugar. Document Released: 05/17/2006 Document Revised: 05/12/2013 Document Reviewed: 10/16/2011 [facility] [address] Patient Information 2015 [place], [place]. This information is not intended to replace advice given to you by your health care provider. Make sure you discuss any questions you have with your health care provider.

## 2014-07-20 NOTE — Progress Notes (Signed)
Safety precautions to be maintained throughout the outpatient stay will include: orient to surroundings, keep bed in low position, maintain call bell within reach at all times, provide assistance with transfer out of bed and ambulation.  

## 2014-07-20 NOTE — Progress Notes (Signed)
Subjective:    Patient ID: Courtney [name], female    DOB: 11-03-1940, 74 y.o.   MRN: [id number]  HPI  PROCEDURE PERFORMED: Lumbar facet (medial branch block)   NOTE: The patient is a 74 y.o. female who returns to [place] for further evaluation and treatment of pain involving the lumbar and lower extremity region. 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 There is concern regarding significant component of patient's pain being due to facet arthropathy causing facet syndrome. The risks, benefits, and expectations of the procedure have been discussed and explained to the patient who was understanding and in agreement with suggested treatment plan. We will proceed with interventional treatment as discussed and as explained to the patient who was understanding and wished to proceed with procedure as planned.   DESCRIPTION OF PROCEDURE: Lumbar facet (medial branch block) with IV Versed, IV fentanyl conscious sedation, EKG, blood pressure, pulse, and pulse oximetry monitoring. The procedure was performed with the patient in the prone position. Betadine prep of proposed entry site performed.   NEEDLE PLACEMENT AT: Left L 3 lumbar facet (medial branch block). Under fluoroscopic guidance with oblique orientation of 15 degrees, a 22-gauge needle was inserted at the L 3 vertebral body level with needle placed at the targeted area of Burton's Eye or Eye of the Scotty Dog with documentation of needle placement in the superior and lateral border of targeted area of Burton's Eye or Eye of the Scotty Dog with oblique orientation of 15 degrees. Following documentation of needle placement at the L 3 vertebral body level, needle placement was then  accomplished at the L 4 vertebral body level.   NEEDLE PLACEMENT AT L4 and L5 VERTEBRAL BODY LEVELS ON THE LEFT SIDE The procedure was performed at the L4 and L5 vertebral body levels exactly as was performed at the L 3 vertebral body level utilizing the same technique and under fluoroscopic guidance.  NEEDLE PLACEMENT AT THE SACRAL ALA with AP view of the lumbosacral spine. With the patient in the prone position, Betadine prep of proposed entry site accomplished, a 22 gauge needle was inserted in the region of the sacral ala (groove formed by the superior articulating process of S1 and the sacral wing). Following documentation of needle placement at the sacral ala,  needle placement was then accomplished at the S1 foramen level.   NEEDLE PLACEMENT AT THE S1 FORAMEN LEVEL under fluoroscopic guidance with AP view of the lumbosacral spine and cephalad orientation of the fluoroscope, a 22-gauge needle was placed at the superior and lateral border of the S1 foramen under fluoroscopic guidance. Following documentation of needle placement at the S1 foramen.   Needle placement was then verified at all levels on lateral view. Following documentation of needle placement at all levels on lateral view and following negative aspiration for heme and CSF, each level was injected with 1 mL of 0.25% bupivacaine with Kenalog.     LUMBAR FACET, MEDIAL BRANCH NERVE, BLOCKS PERFORMED ON THE RIGHT SIDE   The procedure was performed on the right side exactly as was performed on the left side at the same levels and utilizing the same technique under fluoroscopic guidance.     The patient tolerated the procedure well. A total of 40 mg of Kenalog was utilized  for the procedure.   PLAN:  1. Medications: The patient will continue presently prescribed medications. 2. May consider modification of treatment regimen at time of return appointment pending response to treatment rendered on today's visit. 3. The patient is  to follow-up with primary care physician, Dr. Emily Filbert, for further evaluation of blood pressure and general medical condition status post steroid injection performed on today's visit. 4. Surgical follow-up evaluation. 5. Neurological follow-up evaluation. 6. The patient may be candidate for radiofrequency procedures, implantation type procedures, and other treatment pending response to treatment and follow-up evaluation. 7. The patient has been advised to call the [facility] prior to scheduled return appointment should there be significant change in condition or should patient have other concerns regarding condition prior to scheduled return appointment.  The patient is understanding and in agreement with suggested treatment plan.     Review of Systems     Objective:   Physical Exam        Assessment & Plan:

## 2014-07-21 ENCOUNTER — Telehealth: Payer: Self-pay | Admitting: *Deleted

## 2014-07-21 NOTE — Telephone Encounter (Signed)
Denies complications post procedure. 

## 2014-08-18 ENCOUNTER — Ambulatory Visit: Payer: Medicare Other | Attending: Pain Medicine | Admitting: Pain Medicine

## 2014-08-18 ENCOUNTER — Encounter: Payer: Self-pay | Admitting: Pain Medicine

## 2014-08-18 VITALS — BP 153/64 | HR 82 | Temp 98.0°F | Resp 16 | Ht 68.0 in | Wt 200.0 lb

## 2014-08-18 DIAGNOSIS — M533 Sacrococcygeal disorders, not elsewhere classified: Secondary | ICD-10-CM | POA: Diagnosis not present

## 2014-08-18 DIAGNOSIS — M79604 Pain in right leg: Secondary | ICD-10-CM | POA: Diagnosis present

## 2014-08-18 DIAGNOSIS — M5136 Other intervertebral disc degeneration, lumbar region: Secondary | ICD-10-CM | POA: Diagnosis not present

## 2014-08-18 DIAGNOSIS — M47816 Spondylosis without myelopathy or radiculopathy, lumbar region: Secondary | ICD-10-CM

## 2014-08-18 DIAGNOSIS — M545 Low back pain: Secondary | ICD-10-CM | POA: Diagnosis present

## 2014-08-18 DIAGNOSIS — M79605 Pain in left leg: Secondary | ICD-10-CM | POA: Diagnosis present

## 2014-08-18 NOTE — Progress Notes (Signed)
Discharged to home ambulatory .  Pre procedure instructions given with teach back 3 done.

## 2014-08-18 NOTE — Patient Instructions (Addendum)
Continue present medication  Block of nerves to the sacroiliac joint to be performed Monday, 08/24/2014 if approved by your insurance. Please call our office to see if your insurance approved your procedure  F/U PCP Dr. Emily Filbert for evaliation of  BP and general medical  condition  F/U surgical evaluation  F/U neurological evaluation  May consider radiofrequency rhizolysis or intraspinal procedures pending response to present treatment and F/U evaluation   Patient to call Pain Management Center should patient have concerns prior to scheduled return appointmen. GENERAL RISKS AND COMPLICATIONS  What are the risk, side effects and possible complications? Generally speaking, most procedures are safe.  However, with any procedure there are risks, side effects, and the possibility of complications.  The risks and complications are dependent upon the sites that are lesioned, or the type of nerve block to be performed.  The closer the procedure is to the spine, the more serious the risks are.  Great care is taken when placing the radio frequency needles, block needles or lesioning probes, but sometimes complications can occur. 1. Infection: Any time there is an injection through the skin, there is a risk of infection.  This is why sterile conditions are used for these blocks.  There are four possible types of infection. 1. Localized skin infection. 2. Central Nervous System Infection-This can be in the form of Meningitis, which can be deadly. 3. Epidural Infections-This can be in the form of an epidural abscess, which can cause pressure inside of the spine, causing compression of the spinal cord with subsequent paralysis. This would require an emergency surgery to decompress, and there are no guarantees that the patient would recover from the paralysis. 4. Discitis-This is an infection of the intervertebral discs.  It occurs in about 1% of discography procedures.  It is difficult to treat and it may  lead to surgery.        2. Pain: the needles have to go through skin and soft tissues, will cause soreness.       3. Damage to internal structures:  The nerves to be lesioned may be near blood vessels or    other nerves which can be potentially damaged.       4. Bleeding: Bleeding is more common if the patient is taking blood thinners such as  aspirin, Coumadin, Ticiid, Plavix, etc., or if he/she have some genetic predisposition  such as hemophilia. Bleeding into the spinal canal can cause compression of the spinal  cord with subsequent paralysis.  This would require an emergency surgery to  decompress and there are no guarantees that the patient would recover from the  paralysis.       5. Pneumothorax:  Puncturing of a lung is a possibility, every time a needle is introduced in  the area of the chest or upper back.  Pneumothorax refers to free air around the  collapsed lung(s), inside of the thoracic cavity (chest cavity).  Another two possible  complications related to a similar event would include: Hemothorax and Chylothorax.   These are variations of the Pneumothorax, where instead of air around the collapsed  lung(s), you may have blood or chyle, respectively.       6. Spinal headaches: They may occur with any procedures in the area of the spine.       7. Persistent CSF (Cerebro-Spinal Fluid) leakage: This is a rare problem, but may occur  with prolonged intrathecal or epidural catheters either due to the formation of a fistulous  track or a dural tear.       8. Nerve damage: By working so close to the spinal cord, there is always a possibility of  nerve damage, which could be as serious as a permanent spinal cord injury with  paralysis.       9. Death:  Although rare, severe deadly allergic reactions known as "Anaphylactic  reaction" can occur to any of the medications used.      10. Worsening of the symptoms:  We can always make thing worse.  What are the chances of something like this  happening? Chances of any of this occuring are extremely low.  By statistics, you have more of a chance of getting killed in a motor vehicle accident: while driving to the hospital than any of the above occurring .  Nevertheless, you should be aware that they are possibilities.  In general, it is similar to taking a shower.  Everybody knows that you can slip, hit your head and get killed.  Does that mean that you should not shower again?  Nevertheless always keep in mind that statistics do not mean anything if you happen to be on the wrong side of them.  Even if a procedure has a 1 (one) in a 1,000,000 (million) chance of going wrong, it you happen to be that one..Also, keep in mind that by statistics, you have more of a chance of having something go wrong when taking medications.  Who should not have this procedure? If you are on a blood thinning medication (e.g. Coumadin, Plavix, see list of "Blood Thinners"), or if you have an active infection going on, you should not have the procedure.  If you are taking any blood thinners, please inform your physician.  How should I prepare for this procedure?  Do not eat or drink anything at least six hours prior to the procedure.  Bring a driver with you .  It cannot be a taxi.  Come accompanied by an adult that can drive you back, and that is strong enough to help you if your legs get weak or numb from the local anesthetic.  Take all of your medicines the morning of the procedure with just enough water to swallow them.  If you have diabetes, make sure that you are scheduled to have your procedure done first thing in the morning, whenever possible.  If you have diabetes, take only half of your insulin dose and notify our nurse that you have done so as soon as you arrive at the clinic.  If you are diabetic, but only take blood sugar pills (oral hypoglycemic), then do not take them on the morning of your procedure.  You may take them after you have had the  procedure.  Do not take aspirin or any aspirin-containing medications, at least eleven (11) days prior to the procedure.  They may prolong bleeding.  Wear loose fitting clothing that may be easy to take off and that you would not mind if it got stained with Betadine or blood.  Do not wear any jewelry or perfume  Remove any nail coloring.  It will interfere with some of our monitoring equipment.  NOTE: Remember that this is not meant to be interpreted as a complete list of all possible complications.  Unforeseen problems may occur.  BLOOD THINNERS The following drugs contain aspirin or other products, which can cause increased bleeding during surgery and should not be taken for 2 weeks prior to and 1 week after surgery.  If you should need take something for relief of minor pain, you may take acetaminophen which is found in Tylenol,m Datril, Anacin-3 and Panadol. It is not blood thinner. The products listed below are.  Do not take any of the products listed below in addition to any listed on your instruction sheet.  A.P.C or A.P.C with Codeine Codeine Phosphate Capsules #3 Ibuprofen Ridaura  ABC compound Congesprin Imuran rimadil  Advil Cope Indocin Robaxisal  Alka-Seltzer Effervescent Pain Reliever and Antacid Coricidin or Coricidin-D  Indomethacin Rufen  Alka-Seltzer plus Cold Medicine Cosprin Ketoprofen S-A-C Tablets  Anacin Analgesic Tablets or Capsules Coumadin Korlgesic Salflex  Anacin Extra Strength Analgesic tablets or capsules CP-2 Tablets Lanoril Salicylate  Anaprox Cuprimine Capsules Levenox Salocol  Anexsia-D Dalteparin Magan Salsalate  Anodynos Darvon compound Magnesium Salicylate Sine-off  Ansaid Dasin Capsules Magsal Sodium Salicylate  Anturane Depen Capsules Marnal Soma  APF Arthritis pain formula Dewitt's Pills Measurin Stanback  Argesic Dia-Gesic Meclofenamic Sulfinpyrazone  Arthritis Bayer Timed Release Aspirin Diclofenac Meclomen Sulindac  Arthritis pain formula  Anacin Dicumarol Medipren Supac  Analgesic (Safety coated) Arthralgen Diffunasal Mefanamic Suprofen  Arthritis Strength Bufferin Dihydrocodeine Mepro Compound Suprol  Arthropan liquid Dopirydamole Methcarbomol with Aspirin Synalgos  ASA tablets/Enseals Disalcid Micrainin Tagament  Ascriptin Doan's Midol Talwin  Ascriptin A/D Dolene Mobidin Tanderil  Ascriptin Extra Strength Dolobid Moblgesic Ticlid  Ascriptin with Codeine Doloprin or Doloprin with Codeine Momentum Tolectin  Asperbuf Duoprin Mono-gesic Trendar  Aspergum Duradyne Motrin or Motrin IB Triminicin  Aspirin plain, buffered or enteric coated Durasal Myochrisine Trigesic  Aspirin Suppositories Easprin Nalfon Trillsate  Aspirin with Codeine Ecotrin Regular or Extra Strength Naprosyn Uracel  Atromid-S Efficin Naproxen Ursinus  Auranofin Capsules Elmiron Neocylate Vanquish  Axotal Emagrin Norgesic Verin  Azathioprine Empirin or Empirin with Codeine Normiflo Vitamin E  Azolid Emprazil Nuprin Voltaren  Bayer Aspirin plain, buffered or children's or timed BC Tablets or powders Encaprin Orgaran Warfarin Sodium  Buff-a-Comp Enoxaparin Orudis Zorpin  Buff-a-Comp with Codeine Equegesic Os-Cal-Gesic   Buffaprin Excedrin plain, buffered or Extra Strength Oxalid   Bufferin Arthritis Strength Feldene Oxphenbutazone   Bufferin plain or Extra Strength Feldene Capsules Oxycodone with Aspirin   Bufferin with Codeine Fenoprofen Fenoprofen Pabalate or Pabalate-SF   Buffets II Flogesic Panagesic   Buffinol plain or Extra Strength Florinal or Florinal with Codeine Panwarfarin   Buf-Tabs Flurbiprofen Penicillamine   Butalbital Compound Four-way cold tablets Penicillin   Butazolidin Fragmin Pepto-Bismol   Carbenicillin Geminisyn Percodan   Carna Arthritis Reliever Geopen Persantine   Carprofen Gold's salt Persistin   Chloramphenicol Goody's Phenylbutazone   Chloromycetin Haltrain Piroxlcam   Clmetidine heparin Plaquenil   Cllnoril Hyco-pap  Ponstel   Clofibrate Hydroxy chloroquine Propoxyphen         Before stopping any of these medications, be sure to consult the physician who ordered them.  Some, such as Coumadin (Warfarin) are ordered to prevent or treat serious conditions such as "deep thrombosis", "pumonary embolisms", and other heart problems.  The amount of time that you may need off of the medication may also vary with the medication and the reason for which you were taking it.  If you are taking any of these medications, please make sure you notify your pain physician before you undergo any procedures.         Selective Nerve Root Block Patient Information  Description: Specific nerve roots exit the spinal canal and these nerves can be compressed and inflamed by a bulging disc and bone spurs.  By injecting steroids  on the nerve root, we can potentially decrease the inflammation surrounding these nerves, which often leads to decreased pain.  Also, by injecting local anesthesia on the nerve root, this can provide [place] helpful information to give to your referring doctor if it decreases your pain.  Selective nerve root blocks can be done along the spine from the neck to the low back depending on the location of your pain.   After numbing the skin with local anesthesia, a small needle is passed to the nerve root and the position of the needle is verified using x-ray pictures.  After the needle is in correct position, we then deposit the medication.  You may experience a pressure sensation while this is being done.  The entire block usually lasts less than 15 minutes.  Conditions that may be treated with selective nerve root blocks:  Low back and leg pain  Spinal stenosis  Diagnostic block prior to potential surgery  Neck and arm pain  Post laminectomy syndrome  Preparation for the injection:  1. Do not eat any solid food or dairy products within 6 hours of your appointment. 2. You may drink clear liquids up to 2  hours before an appointment.  Clear liquids include water, black coffee, juice or soda.  No milk or cream please. 3. You may take your regular medications, including pain medications, with a sip of water before your appointment.  Diabetics should hold regular insulin (if taken separately) and take 1/2 normal NPH dose the morning of the procedure.  Carry some sugar containing items with you to your appointment. 4. A driver must accompany you and be prepared to drive you home after your procedure. 5. Bring all your current medications with you. 6. An IV may be inserted and sedation may be given at the discretion of the physician. 7. A blood pressure cuff, EKG, and other monitors will often be applied during the procedure.  Some patients may need to have extra oxygen administered for a short period. 8. You will be asked to provide medical information, including allergies, prior to the procedure.  We must know immediately if you are taking blood  Thinners (like Coumadin) or if you are allergic to IV iodine contrast (dye).  Possible side-effects: All are usually temporary  Bleeding from needle site  Light headedness  Numbness and tingling  Decreased blood pressure  Weakness in arms/legs  Pressure sensation in back/neck  Pain at injection site (several days)  Possible complications: All are extremely rare  Infection  Nerve injury  Spinal headache (a headache wore with upright position)  Call if you experience:  Fever/chills associated with headache or increased back/neck pain  Headache worsened by an upright position  New onset weakness or numbness of an extremity below the injection site  Hives or difficulty breathing (go to the emergency room)  Inflammation or drainage at the injection site(s)  Severe back/neck pain greater than usual  New symptoms which are concerning to you  Please note:  Although the local anesthetic injected can often make your back or neck feel  good for several hours after the injection the pain will likely return.  It takes 3-5 days for steroids to work on the nerve root. You may not notice any pain relief for at least one week.  If effective, we will often do a series of 3 injections spaced 3-6 weeks apart to maximally decrease your pain.    If you have any questions, please call [phone] [facility]  Pain Clinic 

## 2014-08-18 NOTE — Progress Notes (Signed)
   Subjective:    Patient ID: Courtney [name], female    DOB: October 23, 1940, 74 y.o.   MRN: [id number]  HPI    Review of Systems     Objective:   Physical Exam        Assessment & Plan:

## 2014-08-18 NOTE — Progress Notes (Signed)
   Subjective:    Patient ID: Courtney [name], female    DOB: 17-Oct-1940, 74 y.o.   MRN: [id number]  HPI  Patient 74 year old female returns to [place] for further evaluation and treatment of pain involving the region of the lower back and lower extremity region. Patient states that her pain involves the lower back lower extremity region and region hips and buttocks on the left side predominantly. Pain is aggravated by standing walking twisting turning maneuvers. Patient has had pain become more intense as the day progresses. Pain also interferes patient ability to turn over in bed. There is concern regarding component of patient's pain being due to sacroiliac joint dysfunction in addition to intraspinal abnormalities of the lumbosacral region. We will proceed with block of nerves to the sacroiliac joint at time return appointment. The patient was understanding and in agreement with suggested treatment plan.     Review of Systems     Objective:   Physical Exam   There was tenderness to palpation of the splenius capitis and occipitalis musculature regions of mild degree. There was mild tenderness of the cervical facet cervical paraspinal musculature region. There was mild tinnitus of the acromioclavicular glenohumeral joint region. There was unremarkable Spurling's maneuver. Tinel and Phalen's maneuver were without increase of pain of significant degree. Patient appeared to be with bilaterally equal grip strength. There was tends to palpation over the thoracic facet thoracic paraspinal musculature region. No crepitus of the thoracic region was noted. Palpation over the lumbar paraspinal musculature region lumbar facet region was a tends to palpation of moderate degree with moderate to moderately severe discomfort on palpation of the PSIS and PII S region as well as the gluteal and piriformis musculature regions and especially on the left side compared to the right side. There was  increased pain with pressure prior to the ilium and there was tends to palpation of the greater trochanteric region iliotibial band region of moderate degree. There was negative clonus negative Homans. No definite sensory deficit of dermatomal distribution was detected. Abdomen was nontender and no costovertebral angle tenderness was noted.     Assessment & Plan:    DDD lumbar surgical  Surgical probe posterior to the L3 vertebral body level, wish additional probes at L4-5 and L5-S1  Lumbar facet syndrome  Sacroiliac joint dysfunction     Plan   Continue present medications  Block of nerves to the sacroiliac joint to be performed at time of return appointment  F/U PCP Dr. Emily Filbert for evaliation of  BP and general medical  condition  F/U surgical evaluation as discussed.  F/U neurological evaluation to be considered as discussed  May consider radiofrequency rhizolysis or intraspinal procedures pending response to present treatment and F/U evaluation   Patient to call Pain Management Center should patient have concerns prior to scheduled return appointmen.

## 2014-08-18 NOTE — Progress Notes (Signed)
Safety precautions to be maintained throughout the outpatient stay will include: orient to surroundings, keep bed in low position, maintain call bell within reach at all times, provide assistance with transfer out of bed and ambulation.  

## 2014-08-24 ENCOUNTER — Encounter: Payer: Self-pay | Admitting: Pain Medicine

## 2014-08-24 ENCOUNTER — Ambulatory Visit: Payer: Medicare Other | Attending: Pain Medicine | Admitting: Pain Medicine

## 2014-08-24 VITALS — BP 133/56 | HR 67 | Temp 97.8°F | Resp 16 | Ht 68.0 in | Wt 200.0 lb

## 2014-08-24 DIAGNOSIS — M79604 Pain in right leg: Secondary | ICD-10-CM | POA: Diagnosis present

## 2014-08-24 DIAGNOSIS — M47816 Spondylosis without myelopathy or radiculopathy, lumbar region: Secondary | ICD-10-CM

## 2014-08-24 DIAGNOSIS — M533 Sacrococcygeal disorders, not elsewhere classified: Secondary | ICD-10-CM

## 2014-08-24 DIAGNOSIS — M79605 Pain in left leg: Secondary | ICD-10-CM | POA: Diagnosis present

## 2014-08-24 DIAGNOSIS — M545 Low back pain: Secondary | ICD-10-CM | POA: Diagnosis present

## 2014-08-24 DIAGNOSIS — M5136 Other intervertebral disc degeneration, lumbar region: Secondary | ICD-10-CM | POA: Diagnosis not present

## 2014-08-24 MED ORDER — TRIAMCINOLONE ACETONIDE 40 MG/ML IJ SUSP
INTRAMUSCULAR | Status: AC
  Administered 2014-08-24: 10:00:00
  Filled 2014-08-24: qty 1

## 2014-08-24 MED ORDER — ORPHENADRINE CITRATE 30 MG/ML IJ SOLN
INTRAMUSCULAR | Status: AC
  Administered 2014-08-24: 10:00:00
  Filled 2014-08-24: qty 2

## 2014-08-24 MED ORDER — FENTANYL CITRATE (PF) 100 MCG/2ML IJ SOLN
INTRAMUSCULAR | Status: AC
  Administered 2014-08-24: 100 ug via INTRAVASCULAR
  Filled 2014-08-24: qty 2

## 2014-08-24 MED ORDER — MIDAZOLAM HCL 5 MG/5ML IJ SOLN
INTRAMUSCULAR | Status: AC
  Administered 2014-08-24: 3 mg via INTRAVENOUS
  Filled 2014-08-24: qty 5

## 2014-08-24 MED ORDER — BUPIVACAINE HCL (PF) 0.25 % IJ SOLN
INTRAMUSCULAR | Status: AC
  Administered 2014-08-24: 10:00:00
  Filled 2014-08-24: qty 30

## 2014-08-24 NOTE — Progress Notes (Signed)
Subjective:    Patient ID: Courtney [name], female    DOB: Nov 20, 1940, 75 y.o.   MRN: [id number]  HPI  PROCEDURE:  Block of nerves to the sacroiliac joint.   NOTE:  The patient is a 74 y.o. female who returns to the [facility] for further evaluation and treatment of pain involving the lower back and lower extremity region with pain in the region of the buttocks as well. Prior  MRI studies reveal DDD lumbar  Region withsurgical probe posterior to the L3 vertebral body level, with additional probes at L4-5 and L5-S1.  The patient is with severe tenderness to palpation over the PSIS and PII S region with positive Patrick's maneuver  There is concern regarding a significant component of the patient's pain being due to sacroiliac joint dysfunction The risks, benefits, expectations of the procedure have been discussed and explained to the patient who is understanding and willing to proceed with interventional treatment in attempt to decrease severity of patient's symptoms, minimize the risk of medication escalation and  hopefully retard the progression of the patient's symptoms. We will proceed with what is felt to be a medically necessary procedure, block of nerves to the sacroiliac joint.   DESCRIPTION OF PROCEDURE:  Block of nerves to the sacroiliac joint.   The patient was taken to the fluoroscopy suite. With the patient in the prone position with EKG, blood pressure, pulse and pulse oximetry monitoring, IV Versed, IV fentanyl conscious sedation, Betadine prep of proposed entry site was performed.   Block of nerves at the L5 vertebral body level.   With the patient in prone position, under fluoroscopic guidance, a 22 -gauge needle was inserted at the L5 vertebral body level on the  left side. With 15 degrees oblique orientation a 22 -gauge needle was inserted in the region known as Burton's eye or eye of the Scotty dog. Following documentation of needle placement in the area of Burton's  eye or eye of the Scotty dog under fluoroscopic guidance, needle placement was then accomplished at the sacral ala level on the  left side.   Needle placement at the sacral ala.   With the patient in prone position under fluoroscopic guidance with AP view of the lumbosacral spine, a 22 -gauge needle was inserted in the region known as the sacral ala on the  left side. Following documentation of needle placement on the  left side under fluoroscopic guidance needle placement was then accomplished at the S1 foramen level.   Needle placement at the S1 foramen level.   With the patient in prone position under fluoroscopic guidance with AP view of the lumbosacral spine and cephalad orientation, a 22 -gauge needle was inserted at the superior and lateral border of the S1 foramen on the  left side. Following documentation of needle placement at the S1 foramen level on the  left side, needle placement was then accomplished at the S2 foramen level on the  left side.   Needle placement at the S2 foramen level.   With the patient in prone position with AP view of the lumbosacral spine with cephalad orientation, a 22 - gauge needle was inserted at the superior and lateral border of the S2 foramen under fluoroscopic guidance on the  left side. Following needle placement at the L5 vertebral body level, sacral ala, S1 foramen and S2 foramen on the  left side, needle placement was verified on lateral view under fluoroscopic guidance.  Following needle placement documentation on lateral  view, each needle was injected with 1 mL of 0.25% bupivacaine and Kenalog.   A total of 10mg  of Kenalog was utilized for the procedure.   PLAN:  1. Medications: The patient will continue presently prescribed medications.  2. The patient will be considered for modification of treatment regimen pending response to the procedure performed on today's visit.  3. The patient is to follow-up with primary care physician Dr. Emily Filbert  for evaluation of blood pressure and general medical condition following the procedure performed on today's visit.  4. Surgical evaluation as discussed.  5. Neurological evaluation as discussed.  6. The patient may be a candidate for radiofrequency procedures, implantation devices and other treatment pending response to treatment performed on today's visit and follow-up evaluation.  7. The patient has been advised to adhere to proper body mechanics and to avoid activities which may exacerbate the patient's symptoms.   Return appointment to [facility] as scheduled.     Review of Systems     Objective:   Physical Exam        Assessment & Plan:

## 2014-08-24 NOTE — Progress Notes (Signed)
Safety precautions to be maintained throughout the outpatient stay will include: orient to surroundings, keep bed in low position, maintain call bell within reach at all times, provide assistance with transfer out of bed and ambulation.  

## 2014-08-24 NOTE — Patient Instructions (Addendum)
Continue present medications  F/U PCP Dr. Emily Filbert for evaliation of  BP and general medical  condition  F/U surgical evaluation as needed  F/U neurological evaluation  May consider radiofrequency rhizolysis or intraspinal procedures pending response to present treatment and F/U evaluation   Patient to call [facility] should patient have concerns prior to scheduled return appointmen. Pain Management Discharge Instructions  General Discharge Instructions :  If you need to reach your doctor call: Monday-Friday 8:00 am - 4:00 pm at [phone] or toll free [phone].  After clinic hours [phone] to have operator reach doctor.  Bring all of your medication bottles to all your appointments in the pain clinic.  To cancel or reschedule your appointment with Pain Management please remember to call 24 hours in advance to avoid a fee.  Refer to the educational materials which you have been given on: General Risks, I had my Procedure. Discharge Instructions, Post Sedation.  Post Procedure Instructions:  The drugs you were given will stay in your system until tomorrow, so for the next 24 hours you should not drive, make any legal decisions or drink any alcoholic beverages.  You may eat anything you prefer, but it is better to start with liquids then soups and crackers, and gradually work up to solid foods.  Please notify your doctor immediately if you have any unusual bleeding, trouble breathing or pain that is not related to your normal pain.  Depending on the type of procedure that was done, some parts of your body may feel week and/or numb.  This usually clears up by tonight or the next day.  Walk with the use of an assistive device or accompanied by an adult for the 24 hours.  You may use ice on the affected area for the first 24 hours.  Put ice in a Ziploc bag and cover with a towel and place against area 15 minutes on 15 minutes off.  You may switch to heat after  24 hours.GENERAL RISKS AND COMPLICATIONS  What are the risk, side effects and possible complications? Generally speaking, most procedures are safe.  However, with any procedure there are risks, side effects, and the possibility of complications.  The risks and complications are dependent upon the sites that are lesioned, or the type of nerve block to be performed.  The closer the procedure is to the spine, the more serious the risks are.  Great care is taken when placing the radio frequency needles, block needles or lesioning probes, but sometimes complications can occur. 1. Infection: Any time there is an injection through the skin, there is a risk of infection.  This is why sterile conditions are used for these blocks.  There are four possible types of infection. 1. Localized skin infection. 2. Central Nervous System Infection-This can be in the form of Meningitis, which can be deadly. 3. Epidural Infections-This can be in the form of an epidural abscess, which can cause pressure inside of the spine, causing compression of the spinal cord with subsequent paralysis. This would require an emergency surgery to decompress, and there are no guarantees that the patient would recover from the paralysis. 4. Discitis-This is an infection of the intervertebral discs.  It occurs in about 1% of discography procedures.  It is difficult to treat and it may lead to surgery.        2. Pain: the needles have to go through skin and soft tissues, will cause soreness.       3. Damage  to internal structures:  The nerves to be lesioned may be near blood vessels or    other nerves which can be potentially damaged.       4. Bleeding: Bleeding is more common if the patient is taking blood thinners such as  aspirin, Coumadin, Ticiid, Plavix, etc., or if he/she have some genetic predisposition  such as hemophilia. Bleeding into the spinal canal can cause compression of the spinal  cord with subsequent paralysis.  This would  require an emergency surgery to  decompress and there are no guarantees that the patient would recover from the  paralysis.       5. Pneumothorax:  Puncturing of a lung is a possibility, every time a needle is introduced in  the area of the chest or upper back.  Pneumothorax refers to free air around the  collapsed lung(s), inside of the thoracic cavity (chest cavity).  Another two possible  complications related to a similar event would include: Hemothorax and Chylothorax.   These are variations of the Pneumothorax, where instead of air around the collapsed  lung(s), you may have blood or chyle, respectively.       6. Spinal headaches: They may occur with any procedures in the area of the spine.       7. Persistent CSF (Cerebro-Spinal Fluid) leakage: This is a rare problem, but may occur  with prolonged intrathecal or epidural catheters either due to the formation of a fistulous  track or a dural tear.       8. Nerve damage: By working so close to the spinal cord, there is always a possibility of  nerve damage, which could be as serious as a permanent spinal cord injury with  paralysis.       9. Death:  Although rare, severe deadly allergic reactions known as "Anaphylactic  reaction" can occur to any of the medications used.      10. Worsening of the symptoms:  We can always make thing worse.  What are the chances of something like this happening? Chances of any of this occuring are extremely low.  By statistics, you have more of a chance of getting killed in a motor vehicle accident: while driving to the hospital than any of the above occurring .  Nevertheless, you should be aware that they are possibilities.  In general, it is similar to taking a shower.  Everybody knows that you can slip, hit your head and get killed.  Does that mean that you should not shower again?  Nevertheless always keep in mind that statistics do not mean anything if you happen to be on the wrong side of them.  Even if a procedure  has a 1 (one) in a 1,000,000 (million) chance of going wrong, it you happen to be that one..Also, keep in mind that by statistics, you have more of a chance of having something go wrong when taking medications.  Who should not have this procedure? If you are on a blood thinning medication (e.g. Coumadin, Plavix, see list of "Blood Thinners"), or if you have an active infection going on, you should not have the procedure.  If you are taking any blood thinners, please inform your physician.  How should I prepare for this procedure?  Do not eat or drink anything at least six hours prior to the procedure.  Bring a driver with you .  It cannot be a taxi.  Come accompanied by an adult that can drive you back, and that is  strong enough to help you if your legs get weak or numb from the local anesthetic.  Take all of your medicines the morning of the procedure with just enough water to swallow them.  If you have diabetes, make sure that you are scheduled to have your procedure done first thing in the morning, whenever possible.  If you have diabetes, take only half of your insulin dose and notify our nurse that you have done so as soon as you arrive at the clinic.  If you are diabetic, but only take blood sugar pills (oral hypoglycemic), then do not take them on the morning of your procedure.  You may take them after you have had the procedure.  Do not take aspirin or any aspirin-containing medications, at least eleven (11) days prior to the procedure.  They may prolong bleeding.  Wear loose fitting clothing that may be easy to take off and that you would not mind if it got stained with Betadine or blood.  Do not wear any jewelry or perfume  Remove any nail coloring.  It will interfere with some of our monitoring equipment.  NOTE: Remember that this is not meant to be interpreted as a complete list of all possible complications.  Unforeseen problems may occur.  BLOOD THINNERS The following  drugs contain aspirin or other products, which can cause increased bleeding during surgery and should not be taken for 2 weeks prior to and 1 week after surgery.  If you should need take something for relief of minor pain, you may take acetaminophen which is found in Tylenol,m Datril, Anacin-3 and Panadol. It is not blood thinner. The products listed below are.  Do not take any of the products listed below in addition to any listed on your instruction sheet.  A.P.C or A.P.C with Codeine Codeine Phosphate Capsules #3 Ibuprofen Ridaura  ABC compound Congesprin Imuran rimadil  Advil Cope Indocin Robaxisal  Alka-Seltzer Effervescent Pain Reliever and Antacid Coricidin or Coricidin-D  Indomethacin Rufen  Alka-Seltzer plus Cold Medicine Cosprin Ketoprofen S-A-C Tablets  Anacin Analgesic Tablets or Capsules Coumadin Korlgesic Salflex  Anacin Extra Strength Analgesic tablets or capsules CP-2 Tablets Lanoril Salicylate  Anaprox Cuprimine Capsules Levenox Salocol  Anexsia-D Dalteparin Magan Salsalate  Anodynos Darvon compound Magnesium Salicylate Sine-off  Ansaid Dasin Capsules Magsal Sodium Salicylate  Anturane Depen Capsules Marnal Soma  APF Arthritis pain formula Dewitt's Pills Measurin Stanback  Argesic Dia-Gesic Meclofenamic Sulfinpyrazone  Arthritis Bayer Timed Release Aspirin Diclofenac Meclomen Sulindac  Arthritis pain formula Anacin Dicumarol Medipren Supac  Analgesic (Safety coated) Arthralgen Diffunasal Mefanamic Suprofen  Arthritis Strength Bufferin Dihydrocodeine Mepro Compound Suprol  Arthropan liquid Dopirydamole Methcarbomol with Aspirin Synalgos  ASA tablets/Enseals Disalcid Micrainin Tagament  Ascriptin Doan's Midol Talwin  Ascriptin A/D Dolene Mobidin Tanderil  Ascriptin Extra Strength Dolobid Moblgesic Ticlid  Ascriptin with Codeine Doloprin or Doloprin with Codeine Momentum Tolectin  Asperbuf Duoprin Mono-gesic Trendar  Aspergum Duradyne Motrin or Motrin IB Triminicin  Aspirin  plain, buffered or enteric coated Durasal Myochrisine Trigesic  Aspirin Suppositories Easprin Nalfon Trillsate  Aspirin with Codeine Ecotrin Regular or Extra Strength Naprosyn Uracel  Atromid-S Efficin Naproxen Ursinus  Auranofin Capsules Elmiron Neocylate Vanquish  Axotal Emagrin Norgesic Verin  Azathioprine Empirin or Empirin with Codeine Normiflo Vitamin E  Azolid Emprazil Nuprin Voltaren  Bayer Aspirin plain, buffered or children's or timed BC Tablets or powders Encaprin Orgaran Warfarin Sodium  Buff-a-Comp Enoxaparin Orudis Zorpin  Buff-a-Comp with Codeine Equegesic Os-Cal-Gesic   Buffaprin Excedrin plain, buffered or Extra  Strength Oxalid   Bufferin Arthritis Strength Feldene Oxphenbutazone   Bufferin plain or Extra Strength Feldene Capsules Oxycodone with Aspirin   Bufferin with Codeine Fenoprofen Fenoprofen Pabalate or Pabalate-SF   Buffets II Flogesic Panagesic   Buffinol plain or Extra Strength Florinal or Florinal with Codeine Panwarfarin   Buf-Tabs Flurbiprofen Penicillamine   Butalbital Compound Four-way cold tablets Penicillin   Butazolidin Fragmin Pepto-Bismol   Carbenicillin Geminisyn Percodan   Carna Arthritis Reliever Geopen Persantine   Carprofen Gold's salt Persistin   Chloramphenicol Goody's Phenylbutazone   Chloromycetin Haltrain Piroxlcam   Clmetidine heparin Plaquenil   Cllnoril Hyco-pap Ponstel   Clofibrate Hydroxy chloroquine Propoxyphen         Before stopping any of these medications, be sure to consult the physician who ordered them.  Some, such as Coumadin (Warfarin) are ordered to prevent or treat serious conditions such as "deep thrombosis", "pumonary embolisms", and other heart problems.  The amount of time that you may need off of the medication may also vary with the medication and the reason for which you were taking it.  If you are taking any of these medications, please make sure you notify your pain physician before you undergo any  procedures.

## 2014-08-25 ENCOUNTER — Telehealth: Payer: Self-pay | Admitting: *Deleted

## 2014-08-25 NOTE — Telephone Encounter (Signed)
Denies complications post procedure. 

## 2014-09-24 ENCOUNTER — Ambulatory Visit: Payer: Medicare Other | Admitting: Pain Medicine

## 2014-09-29 ENCOUNTER — Encounter: Payer: Self-pay | Admitting: Pain Medicine

## 2014-09-29 ENCOUNTER — Ambulatory Visit: Payer: Medicare Other | Attending: Pain Medicine | Admitting: Pain Medicine

## 2014-09-29 VITALS — BP 130/52 | HR 81 | Temp 98.2°F | Resp 16 | Ht 64.5 in | Wt 196.0 lb

## 2014-09-29 DIAGNOSIS — M533 Sacrococcygeal disorders, not elsewhere classified: Secondary | ICD-10-CM

## 2014-09-29 DIAGNOSIS — Z9889 Other specified postprocedural states: Secondary | ICD-10-CM | POA: Insufficient documentation

## 2014-09-29 DIAGNOSIS — M5136 Other intervertebral disc degeneration, lumbar region: Secondary | ICD-10-CM

## 2014-09-29 DIAGNOSIS — M79604 Pain in right leg: Secondary | ICD-10-CM | POA: Diagnosis present

## 2014-09-29 DIAGNOSIS — M47816 Spondylosis without myelopathy or radiculopathy, lumbar region: Secondary | ICD-10-CM

## 2014-09-29 DIAGNOSIS — M48062 Spinal stenosis, lumbar region with neurogenic claudication: Secondary | ICD-10-CM

## 2014-09-29 DIAGNOSIS — M4806 Spinal stenosis, lumbar region: Secondary | ICD-10-CM | POA: Insufficient documentation

## 2014-09-29 DIAGNOSIS — M545 Low back pain: Secondary | ICD-10-CM | POA: Diagnosis present

## 2014-09-29 DIAGNOSIS — M79605 Pain in left leg: Secondary | ICD-10-CM | POA: Diagnosis present

## 2014-09-29 NOTE — Progress Notes (Signed)
   Subjective:    Patient ID: Courtney [name], female    DOB: [birth year], 74 y.o.   MRN: [id number]  HPI  Patient is 74 year old female returns to [facility] for further evaluation and treatment of pain involving the lower back and lower extremity region. Patient states that she has difficulty standing on her feet for significant period of time before she notices pressure occurring in the back associated with lower extremity pain and weakness. Patient denies any trauma change in events of daily living the cause change in symptomatology. We discussed patient's condition and the patient wished to proceed with interventional treatment in attempt to decrease the severely disabling symptoms. The patient prefers to avoid taking medications. We will proceed with interventional treatment in attempt to decrease severity of symptoms, minimize progression of symptoms, and avoid need for more involved treatment. The patient was in agreement with suggested treatment plan.    Review of Systems     Objective:   Physical Exam  There was tenderness of the splenius capitis and occipitalis muscles of mild degree. There was mild tennis over the cervical facet cervical paraspinal musculature region and thoracic facet thoracic paraspinal musculature region. There appeared to be unremarkable Spurling's maneuver. Patient appeared to be with bilaterally equal grip strength. Tinel and Phalen's maneuver were without increase of pain of significant degree. There was tenderness over the region of the lumbar paraspinal muscles region lumbar facet region a moderate degree. Lateral bending rotation and extension and palpation of the lumbar facets reproduce moderate discomfort. Straight leg raising limited to approximately 30 no increase of pain with dorsiflexion noted. There was negative clonus negative Homans. Strength appeared to be decreased. No definite sensory deficit of dermatomal distribution detected. There was  well-healed surgical scar of the lumbar region without increased warmth or erythema in the region of the scar. There appeared to be increased pain with palpation over the PSIS and PII S region of mild to mildly moderate degree. There was mild tenderness of the greater trochanteric region and iliotibial band region. There was negative clonus negative Homans. Abdomen was nontender with no costovertebral tenderness noted.      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 May consider pending follow-up evaluations  F/U neurological evaluation. May consider pending follow-up evaluations  May consider radiofrequency rhizolysis or intraspinal procedures pending response to present treatment and F/U evaluation   Patient to call [facility] should patient have concerns prior to scheduled return appointment.

## 2014-09-29 NOTE — Patient Instructions (Addendum)
PLAN   Continue present medication  Lumbar epidural steroid injection to be performed at time return appointment  F/U PCP Dr.M Sabra Heck  for evaliation of  BP and general medical  condition  F/U surgical evaluation. May consider pending follow-up evaluations  F/U neurological evaluation. May consider pending follow-up evaluations  May consider radiofrequency rhizolysis or intraspinal procedures pending response to present treatment and F/U evaluation   Patient to call [facility] Center should patient have concerns prior to scheduled return appointment. Stop Aspirin 5 days before your procedure Epidural Steroid Injection Patient Information  Description: The epidural space surrounds the nerves as they exit the spinal cord.  In some patients, the nerves can be compressed and inflamed by a bulging disc or a tight spinal canal (spinal stenosis).  By injecting steroids into the epidural space, we can bring irritated nerves into direct contact with a potentially helpful medication.  These steroids act directly on the irritated nerves and can reduce swelling and inflammation which often leads to decreased pain.  Epidural steroids may be injected anywhere along the spine and from the neck to the low back depending upon the location of your pain.   After numbing the skin with local anesthetic (like Novocaine), a small needle is passed into the epidural space slowly.  You may experience a sensation of pressure while this is being done.  The entire block usually last less than 10 minutes.  Conditions which may be treated by epidural steroids:   Low back and leg pain  Neck and arm pain  Spinal stenosis  Post-laminectomy syndrome  Herpes zoster (shingles) pain  Pain from compression fractures  Preparation for the injection:  1. Do not eat any solid food or dairy products within 6 hours of your appointment.  2. You may drink clear liquids up to 2 hours before appointment.  Clear liquids  include water, black coffee, juice or soda.  No milk or cream please. 3. You may take your regular medication, including pain medications, with a sip of water before your appointment  Diabetics should hold regular insulin (if taken separately) and take 1/2 normal NPH dos the morning of the procedure.  Carry some sugar containing items with you to your appointment. 4. A driver must accompany you and be prepared to drive you home after your procedure.  5. Bring all your current medications with your. 6. An IV may be inserted and sedation may be given at the discretion of the physician.   7. A blood pressure cuff, EKG and other monitors will often be applied during the procedure.  Some patients may need to have extra oxygen administered for a short period. 8. You will be asked to provide medical information, including your allergies, prior to the procedure.  We must know immediately if you are taking blood thinners (like Coumadin/Warfarin)  Or if you are allergic to IV iodine contrast (dye). We must know if you could possible be pregnant.  Possible side-effects:  Bleeding from needle site  Infection (rare, may require surgery)  Nerve injury (rare)  Numbness & tingling (temporary)  Difficulty urinating (rare, temporary)  Spinal headache ( a headache worse with upright posture)  Light -headedness (temporary)  Pain at injection site (several days)  Decreased blood pressure (temporary)  Weakness in arm/leg (temporary)  Pressure sensation in back/neck (temporary)  Call if you experience:  Fever/chills associated with headache or increased back/neck pain.  Headache worsened by an upright position.  New onset weakness or numbness of an extremity  below the injection site  Hives or difficulty breathing (go to the emergency room)  Inflammation or drainage at the infection site  Severe back/neck pain  Any new symptoms which are concerning to you  Please note:  Although the local  anesthetic injected can often make your back or neck feel good for several hours after the injection, the pain will likely return.  It takes 3-7 days for steroids to work in the epidural space.  You may not notice any pain relief for at least that one week.  If effective, we will often do a series of three injections spaced 3-6 weeks apart to maximally decrease your pain.  After the initial series, we generally will wait several months before considering a repeat injection of the same type.  If you have any questions, please call [phone] [facility]

## 2014-09-29 NOTE — Progress Notes (Signed)
Safety precautions to be maintained throughout the outpatient stay will include: orient to surroundings, keep bed in low position, maintain call bell within reach at all times, provide assistance with transfer out of bed and ambulation.  

## 2014-10-12 ENCOUNTER — Ambulatory Visit: Payer: Medicare Other | Attending: Pain Medicine | Admitting: Pain Medicine

## 2014-10-12 ENCOUNTER — Encounter: Payer: Self-pay | Admitting: Pain Medicine

## 2014-10-12 VITALS — BP 136/48 | HR 85 | Temp 97.9°F | Resp 18 | Ht 65.0 in | Wt 200.0 lb

## 2014-10-12 DIAGNOSIS — M533 Sacrococcygeal disorders, not elsewhere classified: Secondary | ICD-10-CM

## 2014-10-12 DIAGNOSIS — Z9889 Other specified postprocedural states: Secondary | ICD-10-CM

## 2014-10-12 DIAGNOSIS — M79605 Pain in left leg: Secondary | ICD-10-CM | POA: Diagnosis present

## 2014-10-12 DIAGNOSIS — M79604 Pain in right leg: Secondary | ICD-10-CM | POA: Diagnosis present

## 2014-10-12 DIAGNOSIS — M48062 Spinal stenosis, lumbar region with neurogenic claudication: Secondary | ICD-10-CM

## 2014-10-12 DIAGNOSIS — M51369 Other intervertebral disc degeneration, lumbar region without mention of lumbar back pain or lower extremity pain: Secondary | ICD-10-CM

## 2014-10-12 DIAGNOSIS — M5136 Other intervertebral disc degeneration, lumbar region: Secondary | ICD-10-CM | POA: Diagnosis not present

## 2014-10-12 DIAGNOSIS — M545 Low back pain: Secondary | ICD-10-CM | POA: Diagnosis present

## 2014-10-12 DIAGNOSIS — M47816 Spondylosis without myelopathy or radiculopathy, lumbar region: Secondary | ICD-10-CM

## 2014-10-12 MED ORDER — MIDAZOLAM HCL 5 MG/5ML IJ SOLN
INTRAMUSCULAR | Status: AC
  Administered 2014-10-12: 2 mg via INTRAVENOUS
  Filled 2014-10-12: qty 5

## 2014-10-12 MED ORDER — BUPIVACAINE HCL (PF) 0.25 % IJ SOLN
INTRAMUSCULAR | Status: AC
  Filled 2014-10-12: qty 30

## 2014-10-12 MED ORDER — ORPHENADRINE CITRATE 30 MG/ML IJ SOLN
INTRAMUSCULAR | Status: AC
  Filled 2014-10-12: qty 2

## 2014-10-12 MED ORDER — SODIUM CHLORIDE 0.9 % IJ SOLN
INTRAMUSCULAR | Status: AC
  Administered 2014-10-12: 4 mL
  Filled 2014-10-12: qty 20

## 2014-10-12 MED ORDER — FENTANYL CITRATE (PF) 100 MCG/2ML IJ SOLN
INTRAMUSCULAR | Status: AC
  Administered 2014-10-12: 50 ug via INTRAVENOUS
  Filled 2014-10-12: qty 2

## 2014-10-12 MED ORDER — TRIAMCINOLONE ACETONIDE 40 MG/ML IJ SUSP
INTRAMUSCULAR | Status: AC
  Administered 2014-10-12: 40 mg
  Filled 2014-10-12: qty 1

## 2014-10-12 MED ORDER — CEFAZOLIN SODIUM 1 G IJ SOLR
INTRAMUSCULAR | Status: AC
  Administered 2014-10-12: 1 g via INTRAVENOUS
  Filled 2014-10-12: qty 10

## 2014-10-12 MED ORDER — LIDOCAINE HCL (PF) 1 % IJ SOLN
INTRAMUSCULAR | Status: AC
  Administered 2014-10-12: 5 mg
  Filled 2014-10-12: qty 5

## 2014-10-12 NOTE — Patient Instructions (Addendum)
PLAN   Continue present medication  F/U PCP Dr. Emily Filbert for evaluation of  BP and general medical  condition  F/U surgical evaluation. May consider pending follow-up evaluations  F/U neurological evaluation. May consider pending follow-up evaluations  May consider radiofrequency rhizolysis or intraspinal procedures pending response to present treatment and F/U evaluation   Patient to call [facility] Management Center should patient have concerns prior to scheduled return appointment. Pain Management Discharge Instructions  General Discharge Instructions :  If you need to reach your doctor call: Monday-Friday 8:00 am - 4:00 pm at [phone] or toll free [phone].  After clinic hours [phone] to have operator reach doctor.  Bring all of your medication bottles to all your appointments in the pain clinic.  To cancel or reschedule your appointment with Pain Management please remember to call 24 hours in advance to avoid a fee.  Refer to the educational materials which you have been given on: General Risks, I had my Procedure. Discharge Instructions, Post Sedation.  Post Procedure Instructions:  The drugs you were given will stay in your system until tomorrow, so for the next 24 hours you should not drive, make any legal decisions or drink any alcoholic beverages.  You may eat anything you prefer, but it is better to start with liquids then soups and crackers, and gradually work up to solid foods.  Please notify your doctor immediately if you have any unusual bleeding, trouble breathing or pain that is not related to your normal pain.  Depending on the type of procedure that was done, some parts of your body may feel week and/or numb.  This usually clears up by tonight or the next day.  Walk with the use of an assistive device or accompanied by an adult for the 24 hours.  You may use ice on the affected area for the first 24 hours.  Put ice in a Ziploc bag and cover with a  towel and place against area 15 minutes on 15 minutes off.  You may switch to heat after 24 hours.GENERAL RISKS AND COMPLICATIONS  What are the risk, side effects and possible complications? Generally speaking, most procedures are safe.  However, with any procedure there are risks, side effects, and the possibility of complications.  The risks and complications are dependent upon the sites that are lesioned, or the type of nerve block to be performed.  The closer the procedure is to the spine, the more serious the risks are.  Great care is taken when placing the radio frequency needles, block needles or lesioning probes, but sometimes complications can occur. 1. Infection: Any time there is an injection through the skin, there is a risk of infection.  This is why sterile conditions are used for these blocks.  There are four possible types of infection. 1. Localized skin infection. 2. Central Nervous System Infection-This can be in the form of Meningitis, which can be deadly. 3. Epidural Infections-This can be in the form of an epidural abscess, which can cause pressure inside of the spine, causing compression of the spinal cord with subsequent paralysis. This would require an emergency surgery to decompress, and there are no guarantees that the patient would recover from the paralysis. 4. Discitis-This is an infection of the intervertebral discs.  It occurs in about 1% of discography procedures.  It is difficult to treat and it may lead to surgery.        2. Pain: the needles have to go through skin and soft tissues,  will cause soreness.       3. Damage to internal structures:  The nerves to be lesioned may be near blood vessels or    other nerves which can be potentially damaged.       4. Bleeding: Bleeding is more common if the patient is taking blood thinners such as  aspirin, Coumadin, Ticiid, Plavix, etc., or if he/she have some genetic predisposition  such as hemophilia. Bleeding into the spinal  canal can cause compression of the spinal  cord with subsequent paralysis.  This would require an emergency surgery to  decompress and there are no guarantees that the patient would recover from the  paralysis.       5. Pneumothorax:  Puncturing of a lung is a possibility, every time a needle is introduced in  the area of the chest or upper back.  Pneumothorax refers to free air around the  collapsed lung(s), inside of the thoracic cavity (chest cavity).  Another two possible  complications related to a similar event would include: Hemothorax and Chylothorax.   These are variations of the Pneumothorax, where instead of air around the collapsed  lung(s), you may have blood or chyle, respectively.       6. Spinal headaches: They may occur with any procedures in the area of the spine.       7. Persistent CSF (Cerebro-Spinal Fluid) leakage: This is a rare problem, but may occur  with prolonged intrathecal or epidural catheters either due to the formation of a fistulous  track or a dural tear.       8. Nerve damage: By working so close to the spinal cord, there is always a possibility of  nerve damage, which could be as serious as a permanent spinal cord injury with  paralysis.       9. Death:  Although rare, severe deadly allergic reactions known as "Anaphylactic  reaction" can occur to any of the medications used.      10. Worsening of the symptoms:  We can always make thing worse.  What are the chances of something like this happening? Chances of any of this occuring are extremely low.  By statistics, you have more of a chance of getting killed in a motor vehicle accident: while driving to the hospital than any of the above occurring .  Nevertheless, you should be aware that they are possibilities.  In general, it is similar to taking a shower.  Everybody knows that you can slip, hit your head and get killed.  Does that mean that you should not shower again?  Nevertheless always keep in mind that statistics  do not mean anything if you happen to be on the wrong side of them.  Even if a procedure has a 1 (one) in a 1,000,000 (million) chance of going wrong, it you happen to be that one..Also, keep in mind that by statistics, you have more of a chance of having something go wrong when taking medications.  Who should not have this procedure? If you are on a blood thinning medication (e.g. Coumadin, Plavix, see list of "Blood Thinners"), or if you have an active infection going on, you should not have the procedure.  If you are taking any blood thinners, please inform your physician.  How should I prepare for this procedure?  Do not eat or drink anything at least six hours prior to the procedure.  Bring a driver with you .  It cannot be a taxi.  Come accompanied  by an adult that can drive you back, and that is strong enough to help you if your legs get weak or numb from the local anesthetic.  Take all of your medicines the morning of the procedure with just enough water to swallow them.  If you have diabetes, make sure that you are scheduled to have your procedure done first thing in the morning, whenever possible.  If you have diabetes, take only half of your insulin dose and notify our nurse that you have done so as soon as you arrive at the clinic.  If you are diabetic, but only take blood sugar pills (oral hypoglycemic), then do not take them on the morning of your procedure.  You may take them after you have had the procedure.  Do not take aspirin or any aspirin-containing medications, at least eleven (11) days prior to the procedure.  They may prolong bleeding.  Wear loose fitting clothing that may be easy to take off and that you would not mind if it got stained with Betadine or blood.  Do not wear any jewelry or perfume  Remove any nail coloring.  It will interfere with some of our monitoring equipment.  NOTE: Remember that this is not meant to be interpreted as a complete list of all  possible complications.  Unforeseen problems may occur.  BLOOD THINNERS The following drugs contain aspirin or other products, which can cause increased bleeding during surgery and should not be taken for 2 weeks prior to and 1 week after surgery.  If you should need take something for relief of minor pain, you may take acetaminophen which is found in Tylenol,m Datril, Anacin-3 and Panadol. It is not blood thinner. The products listed below are.  Do not take any of the products listed below in addition to any listed on your instruction sheet.  A.P.C or A.P.C with Codeine Codeine Phosphate Capsules #3 Ibuprofen Ridaura  ABC compound Congesprin Imuran rimadil  Advil Cope Indocin Robaxisal  Alka-Seltzer Effervescent Pain Reliever and Antacid Coricidin or Coricidin-D  Indomethacin Rufen  Alka-Seltzer plus Cold Medicine Cosprin Ketoprofen S-A-C Tablets  Anacin Analgesic Tablets or Capsules Coumadin Korlgesic Salflex  Anacin Extra Strength Analgesic tablets or capsules CP-2 Tablets Lanoril Salicylate  Anaprox Cuprimine Capsules Levenox Salocol  Anexsia-D Dalteparin Magan Salsalate  Anodynos Darvon compound Magnesium Salicylate Sine-off  Ansaid Dasin Capsules Magsal Sodium Salicylate  Anturane Depen Capsules Marnal Soma  APF Arthritis pain formula Dewitt's Pills Measurin Stanback  Argesic Dia-Gesic Meclofenamic Sulfinpyrazone  Arthritis Bayer Timed Release Aspirin Diclofenac Meclomen Sulindac  Arthritis pain formula Anacin Dicumarol Medipren Supac  Analgesic (Safety coated) Arthralgen Diffunasal Mefanamic Suprofen  Arthritis Strength Bufferin Dihydrocodeine Mepro Compound Suprol  Arthropan liquid Dopirydamole Methcarbomol with Aspirin Synalgos  ASA tablets/Enseals Disalcid Micrainin Tagament  Ascriptin Doan's Midol Talwin  Ascriptin A/D Dolene Mobidin Tanderil  Ascriptin Extra Strength Dolobid Moblgesic Ticlid  Ascriptin with Codeine Doloprin or Doloprin with Codeine Momentum Tolectin   Asperbuf Duoprin Mono-gesic Trendar  Aspergum Duradyne Motrin or Motrin IB Triminicin  Aspirin plain, buffered or enteric coated Durasal Myochrisine Trigesic  Aspirin Suppositories Easprin Nalfon Trillsate  Aspirin with Codeine Ecotrin Regular or Extra Strength Naprosyn Uracel  Atromid-S Efficin Naproxen Ursinus  Auranofin Capsules Elmiron Neocylate Vanquish  Axotal Emagrin Norgesic Verin  Azathioprine Empirin or Empirin with Codeine Normiflo Vitamin E  Azolid Emprazil Nuprin Voltaren  Bayer Aspirin plain, buffered or children's or timed BC Tablets or powders Encaprin Orgaran Warfarin Sodium  Buff-a-Comp Enoxaparin Orudis Zorpin  Buff-a-Comp with  Codeine Equegesic Os-Cal-Gesic   Buffaprin Excedrin plain, buffered or Extra Strength Oxalid   Bufferin Arthritis Strength Feldene Oxphenbutazone   Bufferin plain or Extra Strength Feldene Capsules Oxycodone with Aspirin   Bufferin with Codeine Fenoprofen Fenoprofen Pabalate or Pabalate-SF   Buffets II Flogesic Panagesic   Buffinol plain or Extra Strength Florinal or Florinal with Codeine Panwarfarin   Buf-Tabs Flurbiprofen Penicillamine   Butalbital Compound Four-way cold tablets Penicillin   Butazolidin Fragmin Pepto-Bismol   Carbenicillin Geminisyn Percodan   Carna Arthritis Reliever Geopen Persantine   Carprofen Gold's salt Persistin   Chloramphenicol Goody's Phenylbutazone   Chloromycetin Haltrain Piroxlcam   Clmetidine heparin Plaquenil   Cllnoril Hyco-pap Ponstel   Clofibrate Hydroxy chloroquine Propoxyphen         Before stopping any of these medications, be sure to consult the physician who ordered them.  Some, such as Coumadin (Warfarin) are ordered to prevent or treat serious conditions such as "deep thrombosis", "pumonary embolisms", and other heart problems.  The amount of time that you may need off of the medication may also vary with the medication and the reason for which you were taking it.  If you are taking any of these  medications, please make sure you notify your pain physician before you undergo any procedures.

## 2014-10-12 NOTE — Progress Notes (Signed)
  Safety precautions to be maintained throughout the outpatient stay will include: orient to surroundings, keep bed in low position, maintain call bell within reach at all times, provide assistance with transfer out of bed and ambulation.  Subjective:    Patient ID: Courtney [name], female    DOB: 11/26/1940, 74 y.o.   MRN: [id number]  HPI    Review of Systems     Objective:   Physical Exam        Assessment & Plan:

## 2014-10-12 NOTE — Progress Notes (Signed)
   Subjective:    Patient ID: Courtney [name], female    DOB: 1940-12-21, 74 y.o.   MRN: [id number]  HPI  PROCEDURE PERFORMED: Lumbar epidural steroid injection   NOTE: The patient is a 74 y.o. female who returns to [facility] for further evaluation and treatment of pain involving the lumbar and lower extremity region. MRI revealed the patient to be with degenerative disc disease of the lumbar spine with surgical probe posterior to the L3 vertebral body level, wish additional probes at L4-5 and L5-S1period There is multilevel degenerative disc disease lumbar spine with impingement noted at L2-3, L5-3-4, L4-5, and L5-S1. 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 We will proceed with lumbar epidural steroid injection as discussed and as explained to the patient who is willing to proceed with procedure as planned.   DESCRIPTION OF PROCEDURE: Lumbar epidural steroid injection with IV Versed, IV fentanyl conscious sedation, EKG, blood pressure, pulse, and pulse oximetry monitoring. The procedure was performed with the patient in the prone position under fluoroscopic guidance. A local anesthetic skin wheal of 1.5% plain lidocaine was accomplished at proposed entry site. An 18-gauge Tuohy epidural needle was inserted at the L 4 vertebral body level right of the midline via loss-of-resistance technique with negative heme and negative CSF return. A total of 4 mL of Preservative-Free normal saline with 40 mg of Kenalog injected incrementally via epidurally placed needle. Needle was removed.  A total of 40 mg of Kenalog was utilized for the procedure.    The patient tolerated the injection well.    PLAN:   1. Medications: We will continue presently prescribed medications. 2. Will consider modification of treatment regimen pending response to treatment rendered on today's visit and follow-up evaluation. 3. The patient is to follow-up with primary care physician Dr. Emily Filbert regarding blood pressure and general medical condition status post lumbar epidural steroid injection performed on today's visit. 4. Surgical evaluation. Follow-up surgical evaluation as discussed 5. Neurological evaluation May consider PNCV EMG studies and other studies as discussed 6. The patient may be a candidate for radiofrequency procedures, implantation device, and other treatment pending response to treatment and follow-up evaluation. 7. The patient has been advised to adhere to proper body mechanics and avoid activities which appear to aggravate condition. 8. The patient has been advised to call the Pain Management Center prior to scheduled return appointment should there be significant change in condition or should there be sign  The patient is understanding and agrees with the suggested  treatment plan    Review of Systems     Objective:   Physical Exam        Assessment & Plan:

## 2014-10-13 ENCOUNTER — Telehealth: Payer: Self-pay | Admitting: *Deleted

## 2014-10-13 NOTE — Telephone Encounter (Signed)
Patient verbalizes no complaints

## 2014-11-10 ENCOUNTER — Ambulatory Visit: Payer: Medicare Other | Admitting: Pain Medicine

## 2014-11-10 ENCOUNTER — Telehealth: Payer: Self-pay

## 2014-11-10 NOTE — Telephone Encounter (Signed)
Cancelled appt today due to sickness

## 2014-12-07 ENCOUNTER — Other Ambulatory Visit: Payer: Self-pay | Admitting: Neurosurgery

## 2014-12-07 ENCOUNTER — Other Ambulatory Visit (HOSPITAL_COMMUNITY): Payer: Self-pay | Admitting: Neurosurgery

## 2014-12-07 DIAGNOSIS — M549 Dorsalgia, unspecified: Secondary | ICD-10-CM

## 2014-12-08 ENCOUNTER — Ambulatory Visit: Payer: Medicare Other | Admitting: Pain Medicine

## 2014-12-18 ENCOUNTER — Ambulatory Visit
Admission: RE | Admit: 2014-12-18 | Discharge: 2014-12-18 | Disposition: A | Discharge status: Home / Self Care | Payer: Medicare Other | Source: Ambulatory Visit | Attending: Neurosurgery | Admitting: Neurosurgery

## 2014-12-18 DIAGNOSIS — M549 Dorsalgia, unspecified: Secondary | ICD-10-CM

## 2014-12-18 NOTE — OR Nursing (Signed)
Procedure cancelled per dr. Register.  Patient has taken ASA 81mg 

## 2014-12-25 ENCOUNTER — Other Ambulatory Visit: Payer: Self-pay | Admitting: Diagnostic Radiology

## 2014-12-25 ENCOUNTER — Ambulatory Visit
Admission: RE | Admit: 2014-12-25 | Discharge: 2014-12-25 | Disposition: A | Discharge status: Home / Self Care | Payer: Medicare Other | Source: Ambulatory Visit | Attending: Neurosurgery | Admitting: Neurosurgery

## 2014-12-25 DIAGNOSIS — M5136 Other intervertebral disc degeneration, lumbar region: Secondary | ICD-10-CM | POA: Insufficient documentation

## 2014-12-25 DIAGNOSIS — M4854XA Collapsed vertebra, not elsewhere classified, thoracic region, initial encounter for fracture: Secondary | ICD-10-CM | POA: Insufficient documentation

## 2014-12-25 DIAGNOSIS — M549 Dorsalgia, unspecified: Secondary | ICD-10-CM | POA: Insufficient documentation

## 2014-12-25 DIAGNOSIS — M47816 Spondylosis without myelopathy or radiculopathy, lumbar region: Secondary | ICD-10-CM | POA: Diagnosis not present

## 2014-12-25 DIAGNOSIS — M4806 Spinal stenosis, lumbar region: Secondary | ICD-10-CM | POA: Insufficient documentation

## 2014-12-25 DIAGNOSIS — M5126 Other intervertebral disc displacement, lumbar region: Secondary | ICD-10-CM | POA: Insufficient documentation

## 2014-12-25 HISTORY — DX: Systemic involvement of connective tissue, unspecified: M35.9

## 2014-12-25 LAB — CBC
HCT: 39 % (ref 35.0–47.0)
HEMOGLOBIN: 13.1 g/dL (ref 12.0–16.0)
MCH: 28.4 pg (ref 26.0–34.0)
MCHC: 33.5 g/dL (ref 32.0–36.0)
MCV: 84.7 fL (ref 80.0–100.0)
PLATELETS: 130 10*3/uL — AB (ref 150–440)
RBC: 4.61 MIL/uL (ref 3.80–5.20)
RDW: 13.5 % (ref 11.5–14.5)
WBC: 4.2 10*3/uL (ref 3.6–11.0)

## 2014-12-25 LAB — APTT: APTT: 34 s (ref 24–36)

## 2014-12-25 LAB — PROTIME-INR
INR: 1.22
PROTHROMBIN TIME: 15.6 s — AB (ref 11.4–15.0)

## 2014-12-25 MED ORDER — IOHEXOL 180 MG/ML  SOLN: 20.0000 mL | Freq: Once | INTRAMUSCULAR | Status: DC | PRN

## 2014-12-28 ENCOUNTER — Other Ambulatory Visit: Payer: Self-pay | Admitting: Neurosurgery

## 2014-12-28 DIAGNOSIS — M5137 Other intervertebral disc degeneration, lumbosacral region: Secondary | ICD-10-CM

## 2015-01-13 ENCOUNTER — Ambulatory Visit
Admission: RE | Admit: 2015-01-13 | Discharge: 2015-01-13 | Disposition: A | Discharge status: Home / Self Care | Payer: Medicare Other | Source: Ambulatory Visit | Attending: Neurosurgery | Admitting: Neurosurgery

## 2015-01-13 VITALS — BP 174/79 | HR 122

## 2015-01-13 DIAGNOSIS — Z9889 Other specified postprocedural states: Secondary | ICD-10-CM

## 2015-01-13 DIAGNOSIS — M48062 Spinal stenosis, lumbar region with neurogenic claudication: Secondary | ICD-10-CM

## 2015-01-13 DIAGNOSIS — M5137 Other intervertebral disc degeneration, lumbosacral region: Secondary | ICD-10-CM

## 2015-01-13 DIAGNOSIS — M47816 Spondylosis without myelopathy or radiculopathy, lumbar region: Secondary | ICD-10-CM

## 2015-01-13 DIAGNOSIS — M533 Sacrococcygeal disorders, not elsewhere classified: Secondary | ICD-10-CM

## 2015-01-13 DIAGNOSIS — M5136 Other intervertebral disc degeneration, lumbar region: Secondary | ICD-10-CM

## 2015-01-13 DIAGNOSIS — M51369 Other intervertebral disc degeneration, lumbar region without mention of lumbar back pain or lower extremity pain: Secondary | ICD-10-CM

## 2015-01-13 DIAGNOSIS — M51379 Other intervertebral disc degeneration, lumbosacral region without mention of lumbar back pain or lower extremity pain: Secondary | ICD-10-CM

## 2015-01-13 MED ORDER — IOHEXOL 180 MG/ML  SOLN
15.0000 mL | Freq: Once | INTRAMUSCULAR | Status: AC | PRN
  Administered 2015-01-13: 15 mL via INTRATHECAL

## 2015-01-13 MED ORDER — MEPERIDINE HCL 100 MG/ML IJ SOLN
75.0000 mg | Freq: Once | INTRAMUSCULAR | Status: AC
  Administered 2015-01-13: 75 mg via INTRAMUSCULAR

## 2015-01-13 MED ORDER — DIAZEPAM 5 MG PO TABS
5.0000 mg | ORAL_TABLET | Freq: Once | ORAL | Status: AC
Start: 2015-01-13 — End: 2015-01-13
  Administered 2015-01-13: 5 mg via ORAL

## 2015-01-13 MED ORDER — ONDANSETRON HCL 4 MG/2ML IJ SOLN
4.0000 mg | Freq: Once | INTRAMUSCULAR | Status: AC
  Administered 2015-01-13: 4 mg via INTRAMUSCULAR

## 2015-01-13 NOTE — Discharge Instructions (Signed)

## 2015-01-13 NOTE — Progress Notes (Signed)
Pt states she has taken her 13 hour prep. Discharge instructions explained to pt.

## 2015-01-21 ENCOUNTER — Other Ambulatory Visit: Payer: Self-pay | Admitting: Neurosurgery

## 2015-01-28 ENCOUNTER — Encounter (HOSPITAL_COMMUNITY): Payer: Self-pay

## 2015-01-28 ENCOUNTER — Other Ambulatory Visit (HOSPITAL_COMMUNITY): Payer: Self-pay | Admitting: *Deleted

## 2015-01-28 ENCOUNTER — Encounter (HOSPITAL_COMMUNITY)
Admission: RE | Admit: 2015-01-28 | Discharge: 2015-01-28 | Disposition: A | Discharge status: Home / Self Care | Payer: Medicare Other | Source: Ambulatory Visit | Attending: Neurosurgery | Admitting: Neurosurgery

## 2015-01-28 DIAGNOSIS — Z0183 Encounter for blood typing: Secondary | ICD-10-CM | POA: Insufficient documentation

## 2015-01-28 DIAGNOSIS — R9431 Abnormal electrocardiogram [ECG] [EKG]: Secondary | ICD-10-CM | POA: Diagnosis not present

## 2015-01-28 DIAGNOSIS — M5137 Other intervertebral disc degeneration, lumbosacral region: Secondary | ICD-10-CM | POA: Diagnosis not present

## 2015-01-28 DIAGNOSIS — Z01818 Encounter for other preprocedural examination: Secondary | ICD-10-CM | POA: Diagnosis not present

## 2015-01-28 DIAGNOSIS — Z79899 Other long term (current) drug therapy: Secondary | ICD-10-CM | POA: Diagnosis not present

## 2015-01-28 DIAGNOSIS — Z01812 Encounter for preprocedural laboratory examination: Secondary | ICD-10-CM | POA: Diagnosis not present

## 2015-01-28 LAB — CBC
HCT: 40.7 % (ref 36.0–46.0)
Hemoglobin: 13.2 g/dL (ref 12.0–15.0)
MCH: 27.8 pg (ref 26.0–34.0)
MCHC: 32.4 g/dL (ref 30.0–36.0)
MCV: 85.7 fL (ref 78.0–100.0)
PLATELETS: 138 10*3/uL — AB (ref 150–400)
RBC: 4.75 MIL/uL (ref 3.87–5.11)
RDW: 13 % (ref 11.5–15.5)
WBC: 7.8 10*3/uL (ref 4.0–10.5)

## 2015-01-28 LAB — SURGICAL PCR SCREEN
MRSA, PCR: POSITIVE — AB
STAPHYLOCOCCUS AUREUS: POSITIVE — AB

## 2015-01-28 LAB — GLUCOSE, CAPILLARY: Glucose-Capillary: 151 mg/dL — ABNORMAL HIGH (ref 65–99)

## 2015-01-28 LAB — ABO/RH: ABO/RH(D): A POS

## 2015-01-28 LAB — BASIC METABOLIC PANEL
ANION GAP: 8 (ref 5–15)
BUN: 15 mg/dL (ref 6–20)
CALCIUM: 9.4 mg/dL (ref 8.9–10.3)
CO2: 22 mmol/L (ref 22–32)
Chloride: 105 mmol/L (ref 101–111)
Creatinine, Ser: 0.89 mg/dL (ref 0.44–1.00)
GFR calc Af Amer: >60 mL/min (ref >60)
GLUCOSE: 165 mg/dL — AB (ref 65–99)
Potassium: 4.2 mmol/L (ref 3.5–5.1)
Sodium: 135 mmol/L (ref 135–145)

## 2015-01-28 LAB — TYPE AND SCREEN
ABO/RH(D): A POS
ANTIBODY SCREEN: NEGATIVE

## 2015-01-28 NOTE — Progress Notes (Addendum)
Pt denies cardiac history, chest pain or sob.   Last A1C was in August. It was 6.3. She states fasting blood sugars can range from less than 70 to 140.   Pt instructed to contact Rheumatologist about whether to stop Arava prior to surgery. She voiced understanding.

## 2015-01-28 NOTE — Progress Notes (Signed)
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 Pt already has Mupirocin Ointment at home, she knows to start it tonight.

## 2015-01-28 NOTE — Pre-Procedure Instructions (Addendum)
AJANE ESCUTIA  01/28/2015     Your procedure is scheduled on Tuesday, February 02, 2015 at 10:30 AM.   Report to [facility]" at 7:30 AM.   Call this number if you have problems the morning of surgery: [phone]   Any questions prior to day of surgery, please call [phone] between 8 & 4 PM.   Remember:  Do not eat food or drink liquids after midnight Monday, 02/01/15.  Take these medicines the morning of surgery with A SIP OF WATER: Bisoprolol-Hydrochlorothiazide (Ziac), Doxazosin (Cardura), Omeprazole (Prilosec), Ranitidine (Zantac)  Stop Aspirin as of today.  Please call your rheumatologist and ask if you need to stop Arava prior to surgery  How to Manage Your Diabetes Before Surgery   Why is it important to control my blood sugar before and after surgery?   Improving blood sugar levels before and after surgery helps healing and can limit problems.  A way of improving blood sugar control is eating a healthy diet by:  - Eating less sugar and carbohydrates  - Increasing activity/exercise  - Talk with your doctor about reaching your blood sugar goals  High blood sugars (greater than 180 mg/dL) can raise your risk of infections and slow down your recovery so you will need to focus on controlling your diabetes during the weeks before surgery.  Make sure that the doctor who takes care of your diabetes knows about your planned surgery including the date and location.  How do I manage my blood sugars before surgery?   Check your blood sugar at least 4 times a day, 2 days before surgery to make sure that they are not too high or low.   Check your blood sugar the morning of your surgery when you wake up and every 2 hours until you get to the Short-Stay unit.   Treat a low blood sugar (less than 70 mg/dL) with 1/2 cup of clear juice (cranberry or apple), 4 glucose tablets, OR glucose gel.   Recheck blood sugar in 15 minutes after treatment (to make  sure it is greater than 70 mg/dL).  If blood sugar is not greater than 70 mg/dL on re-check, call [phone] for further instructions.    Report your blood sugar to the Short-Stay nurse when you get to Short-Stay.  References:  [facility], 2007 "How to Manage your Diabetes Before and After Surgery".  What do I do about my diabetes medications?   THE NIGHT BEFORE SURGERY, take 70% of your Humulin N Insulin dose. Take regular dose of your Humulin R Insulin   THE MORNING OF SURGERY, take 50% of your Humulin N  Insulin dose - if you would normally take it.    If your CBG is greater than 220 mg/dL, you may take 1/2 of your sliding scale (correction) dose of insulin.    Do not wear jewelry, make-up or nail polish.  Do not wear lotions, powders, or perfumes.  You may wear deodorant.  Do not shave 48 hours prior to surgery.    Do not bring valuables to the hospital.  [facility] is not responsible for any belongings or valuables.  Contacts, dentures or bridgework may not be worn into surgery.  Leave your suitcase in the car.  After surgery it may be brought to your room.  For patients admitted to the hospital, discharge time will be determined by your treatment team.  Special instructions: [facility] - Preparing for Surgery  Before surgery, you can play an important role.  Because skin is not sterile, your skin needs to be as free of germs as possible.  You can reduce the number of germs on you skin by washing with CHG (chlorahexidine gluconate) soap before surgery.  CHG is an antiseptic cleaner which kills germs and bonds with the skin to continue killing germs even after washing.  Please DO NOT use if you have an allergy to CHG or antibacterial soaps.  If your skin becomes reddened/irritated stop using the CHG and inform your nurse when you arrive at Short Stay.  Do not shave (including legs and underarms) for at least 48 hours prior to the first CHG  shower.  You may shave your face.  Please follow these instructions carefully:   1.  Shower with CHG Soap the night before surgery and the                                morning of Surgery.  2.  If you choose to wash your hair, wash your hair first as usual with your       normal shampoo.  3.  After you shampoo, rinse your hair and body thoroughly to remove the                      Shampoo.  4.  Use CHG as you would any other liquid soap.  You can apply chg directly       to the skin and wash gently with scrungie or a clean washcloth.  5.  Apply the CHG Soap to your body ONLY FROM THE NECK DOWN.        Do not use on open wounds or open sores.  Avoid contact with your eyes, ears, mouth and genitals (private parts).  Wash genitals (private parts) with your normal soap.  6.  Wash thoroughly, paying special attention to the area where your surgery        will be performed.  7.  Thoroughly rinse your body with warm water from the neck down.  8.  DO NOT shower/wash with your normal soap after using and rinsing off       the CHG Soap.  9.  Pat yourself dry with a clean towel.            10.  Wear clean pajamas.            11.  Place clean sheets on your bed the night of your first shower and do not        sleep with pets.  Day of Surgery  Do not apply any lotions the morning of surgery.  Please wear clean clothes to the hospital.  Please read over the following fact sheets that you were given. Pain Booklet, Coughing and Deep Breathing, Blood Transfusion Information, MRSA Information and Surgical Site Infection Prevention

## 2015-01-29 LAB — HEMOGLOBIN A1C
HEMOGLOBIN A1C: 6.9 % — AB (ref 4.8–5.6)
MEAN PLASMA GLUCOSE: 151 mg/dL

## 2015-02-01 MED ORDER — CEFAZOLIN SODIUM-DEXTROSE 2-3 GM-% IV SOLR
2.0000 g | INTRAVENOUS | Status: AC
Start: 2015-02-02 — End: 2015-02-02
  Administered 2015-02-02: 2 g via INTRAVENOUS
  Filled 2015-02-01: qty 50

## 2015-02-01 NOTE — H&P (Signed)
Courtney [name] is an 75 y.o. female.   Chief Complaint: left leg pain [id number] complaining of back pain with radiation to the proximal left leg no better with conservative treatment. In the past she had surgery to the right l3to l5. Conservative treatment has not helped her.  Past Medical History  Diagnosis Date  . Hypertension   . Arthritis     rheumatoid arthritis  . Diabetes mellitus without complication ([place])     type 2  . Varicose veins   . Sleep apnea 2004    does not use cpap  . GERD (gastroesophageal reflux disease)   . Headache     migraines in the past (none since menopause)  . Hives     "chronic"  . Dermatitis   . Rosacea   . Eczema   . PONV (postoperative nausea and vomiting)   . Cancer ([place])     skin cancer (squamous cell)  . Collagen vascular disease ([place])     RA    Past Surgical History  Procedure Laterality Date  . Eye surgery Bilateral 2006 and 2012    cataract surgery with lens implant  . Cholecystectomy  1963  . Tubal ligation  1968  . Endometrial ablation  1991  . Foot surgery Right     ligament and spurs  . Shoulder surgery Right     for a frozen shoulder  . Nasal septum surgery  2004  . Bilateral carpal tunnel release    . Skin cancer excision Right     leg x 4  . Eyelid surgery Bilateral 2014  . Lumbar laminectomy/decompression microdiscectomy Right 12/24/2013    Procedure: RIGHT LUMBAR THREE TO FOUR, LUMBAR FOUR TO FIVE, LUMBAR FIVE TO SACRAL ONE LAMINECTOMY/FORAMINOTOMY;  Surgeon: Floyce Stakes, MD;  Location: [facility];  Service: Neurosurgery;  Laterality: Right;  RIGHT L3-4 L4-5 L5-S1 LAMINECTOMY/FORAMINOTOMY  . Breast cyst aspiration Left     neg    Family History  Problem Relation Age of Onset  . COPD Mother   . Arthritis/Rheumatoid Mother   . Alzheimer's disease Father   . Heart attack Father   . Breast cancer Paternal Aunt 64   Social History:  reports that she has never smoked. She has never used smokeless tobacco.  She reports that she does not drink alcohol or use illicit drugs.  Allergies:  Allergies  Allergen Reactions  . Methotrexate Derivatives Shortness Of Breath    Breathing difficulties   . Codeine Nausea Only  . Contrast Media [Iodinated Diagnostic Agents] Itching and Rash  . Inderide [Propranolol-Hctz] Rash  . Lodine [Etodolac] Rash  . Procardia [Nifedipine] Rash    No prescriptions prior to admission    No results found for this or any previous visit (from the past 61 hour(s)). No results found.  Review of Systems  Constitutional: Negative.   HENT: Positive for tinnitus.   Eyes: Negative.   Respiratory: Positive for shortness of breath.   Cardiovascular: Negative.   Genitourinary: Negative.   Musculoskeletal: Positive for back pain.  Neurological: Positive for sensory change and focal weakness.  Endo/Heme/Allergies: Negative.   Psychiatric/Behavioral: Negative.     There were no vitals taken for this visit. Physical Exam  Hent,nl. Neck, nl. Cv,nl. Lungs, clear. Extremities, nl. NEURO decrease of lumbar flexibi;ity. Scar well healed. tenderness in the left paralumbar area. Myelogram shows large herniated disc at l3-4 with degenerative changes. Assessment/Plan Patient to go ahead with decompression and fusion at l3-4. She is aware of risks  and benefits  Cleone Hulick M 02/01/2015, 2:50 PM

## 2015-02-02 ENCOUNTER — Inpatient Hospital Stay (HOSPITAL_COMMUNITY)
Admission: RE | Admit: 2015-02-02 | Discharge: 2015-02-07 | DRG: 460 | Disposition: A | Discharge status: Home / Self Care | Payer: Medicare Other | Source: Ambulatory Visit | Attending: Neurosurgery | Admitting: Neurosurgery

## 2015-02-02 ENCOUNTER — Inpatient Hospital Stay (HOSPITAL_COMMUNITY): Payer: Medicare Other

## 2015-02-02 ENCOUNTER — Encounter (HOSPITAL_COMMUNITY): Payer: Self-pay | Admitting: General Practice

## 2015-02-02 ENCOUNTER — Encounter (HOSPITAL_COMMUNITY)
Admission: RE | Disposition: A | Discharge status: Home / Self Care | Payer: Medicare Other | Source: Ambulatory Visit | Attending: Neurosurgery

## 2015-02-02 ENCOUNTER — Inpatient Hospital Stay (HOSPITAL_COMMUNITY): Payer: Medicare Other | Admitting: Certified Registered Nurse Anesthetist

## 2015-02-02 DIAGNOSIS — I10 Essential (primary) hypertension: Secondary | ICD-10-CM | POA: Diagnosis not present

## 2015-02-02 DIAGNOSIS — M5116 Intervertebral disc disorders with radiculopathy, lumbar region: Principal | ICD-10-CM | POA: Diagnosis present

## 2015-02-02 DIAGNOSIS — Z981 Arthrodesis status: Secondary | ICD-10-CM | POA: Diagnosis not present

## 2015-02-02 DIAGNOSIS — G4730 Sleep apnea, unspecified: Secondary | ICD-10-CM | POA: Diagnosis present

## 2015-02-02 DIAGNOSIS — Z9889 Other specified postprocedural states: Secondary | ICD-10-CM | POA: Diagnosis not present

## 2015-02-02 DIAGNOSIS — Z85828 Personal history of other malignant neoplasm of skin: Secondary | ICD-10-CM | POA: Diagnosis not present

## 2015-02-02 DIAGNOSIS — Z91041 Radiographic dye allergy status: Secondary | ICD-10-CM

## 2015-02-02 DIAGNOSIS — N179 Acute kidney failure, unspecified: Secondary | ICD-10-CM | POA: Insufficient documentation

## 2015-02-02 DIAGNOSIS — E119 Type 2 diabetes mellitus without complications: Secondary | ICD-10-CM | POA: Insufficient documentation

## 2015-02-02 DIAGNOSIS — E1165 Type 2 diabetes mellitus with hyperglycemia: Secondary | ICD-10-CM | POA: Diagnosis not present

## 2015-02-02 DIAGNOSIS — Z888 Allergy status to other drugs, medicaments and biological substances status: Secondary | ICD-10-CM

## 2015-02-02 DIAGNOSIS — Z22322 Carrier or suspected carrier of Methicillin resistant Staphylococcus aureus: Secondary | ICD-10-CM | POA: Diagnosis not present

## 2015-02-02 DIAGNOSIS — Z794 Long term (current) use of insulin: Secondary | ICD-10-CM | POA: Diagnosis not present

## 2015-02-02 DIAGNOSIS — D696 Thrombocytopenia, unspecified: Secondary | ICD-10-CM | POA: Insufficient documentation

## 2015-02-02 DIAGNOSIS — K219 Gastro-esophageal reflux disease without esophagitis: Secondary | ICD-10-CM | POA: Diagnosis present

## 2015-02-02 DIAGNOSIS — Z885 Allergy status to narcotic agent status: Secondary | ICD-10-CM | POA: Diagnosis not present

## 2015-02-02 DIAGNOSIS — G8918 Other acute postprocedural pain: Secondary | ICD-10-CM | POA: Insufficient documentation

## 2015-02-02 DIAGNOSIS — M47816 Spondylosis without myelopathy or radiculopathy, lumbar region: Secondary | ICD-10-CM | POA: Diagnosis present

## 2015-02-02 DIAGNOSIS — Z419 Encounter for procedure for purposes other than remedying health state, unspecified: Secondary | ICD-10-CM | POA: Diagnosis not present

## 2015-02-02 DIAGNOSIS — M069 Rheumatoid arthritis, unspecified: Secondary | ICD-10-CM | POA: Diagnosis present

## 2015-02-02 DIAGNOSIS — Z9049 Acquired absence of other specified parts of digestive tract: Secondary | ICD-10-CM

## 2015-02-02 DIAGNOSIS — T380X5A Adverse effect of glucocorticoids and synthetic analogues, initial encounter: Secondary | ICD-10-CM | POA: Diagnosis not present

## 2015-02-02 DIAGNOSIS — M79605 Pain in left leg: Secondary | ICD-10-CM | POA: Diagnosis present

## 2015-02-02 DIAGNOSIS — M4726 Other spondylosis with radiculopathy, lumbar region: Secondary | ICD-10-CM | POA: Diagnosis not present

## 2015-02-02 LAB — GLUCOSE, CAPILLARY
GLUCOSE-CAPILLARY: 203 mg/dL — AB (ref 65–99)
Glucose-Capillary: 199 mg/dL — ABNORMAL HIGH (ref 65–99)

## 2015-02-02 SURGERY — POSTERIOR LUMBAR FUSION 1 LEVEL
Anesthesia: General | Site: Spine Lumbar

## 2015-02-02 MED ORDER — VANCOMYCIN HCL 1000 MG IV SOLR
INTRAVENOUS | Status: DC | PRN
  Administered 2015-02-02 (×2): 1000 mg via TOPICAL

## 2015-02-02 MED ORDER — ARTIFICIAL TEARS OP OINT
TOPICAL_OINTMENT | OPHTHALMIC | Status: DC | PRN
Start: 2015-02-02 — End: 2015-02-02
  Administered 2015-02-02: 1 via OPHTHALMIC

## 2015-02-02 MED ORDER — MEPERIDINE HCL 25 MG/ML IJ SOLN: 6.2500 mg | INTRAMUSCULAR | Status: DC | PRN

## 2015-02-02 MED ORDER — ONDANSETRON HCL 4 MG/2ML IJ SOLN
INTRAMUSCULAR | Status: DC | PRN
  Administered 2015-02-02: 4 mg via INTRAVENOUS

## 2015-02-02 MED ORDER — CEFAZOLIN SODIUM-DEXTROSE 2-3 GM-% IV SOLR
2.0000 g | Freq: Three times a day (TID) | INTRAVENOUS | Status: AC
  Administered 2015-02-02 – 2015-02-03 (×2): 2 g via INTRAVENOUS
  Filled 2015-02-02 (×2): qty 50

## 2015-02-02 MED ORDER — DOXAZOSIN MESYLATE 8 MG PO TABS
8.0000 mg | ORAL_TABLET | Freq: Every day | ORAL | Status: DC
  Administered 2015-02-04 – 2015-02-07 (×4): 8 mg via ORAL
  Filled 2015-02-02 (×6): qty 1

## 2015-02-02 MED ORDER — SODIUM CHLORIDE 0.9 % IV SOLN
INTRAVENOUS | Status: DC | PRN
  Administered 2015-02-02: 14:00:00 via INTRAVENOUS

## 2015-02-02 MED ORDER — ACETAMINOPHEN 325 MG PO TABS
650.0000 mg | ORAL_TABLET | ORAL | Status: DC | PRN
  Administered 2015-02-03: 650 mg via ORAL
  Filled 2015-02-02: qty 2

## 2015-02-02 MED ORDER — PHENYLEPHRINE 40 MCG/ML (10ML) SYRINGE FOR IV PUSH (FOR BLOOD PRESSURE SUPPORT)
PREFILLED_SYRINGE | INTRAVENOUS | Status: AC
  Filled 2015-02-02: qty 10

## 2015-02-02 MED ORDER — INFLIXIMAB 100 MG IV SOLR: 500.0000 mg | INTRAVENOUS | Status: DC

## 2015-02-02 MED ORDER — GLYCOPYRROLATE 0.2 MG/ML IJ SOLN
INTRAMUSCULAR | Status: DC | PRN
  Administered 2015-02-02: 0.6 mg via INTRAVENOUS

## 2015-02-02 MED ORDER — PANTOPRAZOLE SODIUM 40 MG PO TBEC
40.0000 mg | DELAYED_RELEASE_TABLET | Freq: Every day | ORAL | Status: DC
  Administered 2015-02-03 – 2015-02-07 (×5): 40 mg via ORAL
  Filled 2015-02-02 (×6): qty 1

## 2015-02-02 MED ORDER — MENTHOL 3 MG MT LOZG
1.0000 | LOZENGE | OROMUCOSAL | Status: DC | PRN
  Filled 2015-02-02: qty 9

## 2015-02-02 MED ORDER — GELATIN ABSORBABLE MT POWD
OROMUCOSAL | Status: DC | PRN
  Administered 2015-02-02: 12:00:00 via TOPICAL

## 2015-02-02 MED ORDER — ASPIRIN EC 81 MG PO TBEC
81.0000 mg | DELAYED_RELEASE_TABLET | Freq: Every day | ORAL | Status: DC
  Administered 2015-02-03 – 2015-02-07 (×6): 81 mg via ORAL
  Filled 2015-02-02 (×6): qty 1

## 2015-02-02 MED ORDER — LORATADINE 10 MG PO TABS
10.0000 mg | ORAL_TABLET | Freq: Every evening | ORAL | Status: DC
  Administered 2015-02-03 – 2015-02-06 (×5): 10 mg via ORAL
  Filled 2015-02-02 (×5): qty 1

## 2015-02-02 MED ORDER — LEFLUNOMIDE 20 MG PO TABS
10.0000 mg | ORAL_TABLET | Freq: Every day | ORAL | Status: DC
  Administered 2015-02-03 – 2015-02-07 (×5): 10 mg via ORAL
  Filled 2015-02-02 (×5): qty 1

## 2015-02-02 MED ORDER — HYDROCHLOROTHIAZIDE 25 MG PO TABS
25.0000 mg | ORAL_TABLET | Freq: Every day | ORAL | Status: DC
  Administered 2015-02-04 – 2015-02-05 (×2): 25 mg via ORAL
  Filled 2015-02-02 (×3): qty 1

## 2015-02-02 MED ORDER — ONDANSETRON HCL 4 MG/2ML IJ SOLN: 4.0000 mg | Freq: Four times a day (QID) | INTRAMUSCULAR | Status: DC | PRN

## 2015-02-02 MED ORDER — ROCURONIUM BROMIDE 50 MG/5ML IV SOLN
INTRAVENOUS | Status: AC
  Filled 2015-02-02: qty 1

## 2015-02-02 MED ORDER — LIDOCAINE HCL (CARDIAC) 20 MG/ML IV SOLN
INTRAVENOUS | Status: DC | PRN
  Administered 2015-02-02: 40 mg via INTRAVENOUS

## 2015-02-02 MED ORDER — PHENYLEPHRINE HCL 10 MG/ML IJ SOLN
INTRAMUSCULAR | Status: DC | PRN
  Administered 2015-02-02 (×2): 80 ug via INTRAVENOUS
  Administered 2015-02-02: 120 ug via INTRAVENOUS
  Administered 2015-02-02: 40 ug via INTRAVENOUS
  Administered 2015-02-02: 80 ug via INTRAVENOUS

## 2015-02-02 MED ORDER — DIPHENHYDRAMINE HCL 12.5 MG/5ML PO ELIX: 12.5000 mg | ORAL_SOLUTION | Freq: Four times a day (QID) | ORAL | Status: DC | PRN

## 2015-02-02 MED ORDER — FENTANYL CITRATE (PF) 250 MCG/5ML IJ SOLN
INTRAMUSCULAR | Status: AC
  Filled 2015-02-02: qty 5

## 2015-02-02 MED ORDER — OXYCODONE-ACETAMINOPHEN 5-325 MG PO TABS: 1.0000 | ORAL_TABLET | ORAL | Status: DC | PRN

## 2015-02-02 MED ORDER — BUPIVACAINE LIPOSOME 1.3 % IJ SUSP
INTRAMUSCULAR | Status: DC | PRN
  Administered 2015-02-02: 20 mL

## 2015-02-02 MED ORDER — SODIUM CHLORIDE 0.9% FLUSH: 9.0000 mL | INTRAVENOUS | Status: DC | PRN

## 2015-02-02 MED ORDER — 0.9 % SODIUM CHLORIDE (POUR BTL) OPTIME
TOPICAL | Status: DC | PRN
  Administered 2015-02-02: 1000 mL

## 2015-02-02 MED ORDER — MORPHINE SULFATE 2 MG/ML IV SOLN
INTRAVENOUS | Status: DC
  Administered 2015-02-02: 16:00:00 via INTRAVENOUS
  Administered 2015-02-03: 2 mg via INTRAVENOUS
  Administered 2015-02-03: 1.5 mg via INTRAVENOUS
  Administered 2015-02-03: 6 mg via INTRAVENOUS

## 2015-02-02 MED ORDER — SIMVASTATIN 40 MG PO TABS
40.0000 mg | ORAL_TABLET | Freq: Every day | ORAL | Status: DC
  Administered 2015-02-03 – 2015-02-06 (×5): 40 mg via ORAL
  Filled 2015-02-02 (×5): qty 1

## 2015-02-02 MED ORDER — DIAZEPAM 5 MG PO TABS
5.0000 mg | ORAL_TABLET | Freq: Four times a day (QID) | ORAL | Status: DC | PRN
  Administered 2015-02-03 – 2015-02-05 (×4): 5 mg via ORAL
  Filled 2015-02-02 (×4): qty 1

## 2015-02-02 MED ORDER — NALOXONE HCL 0.4 MG/ML IJ SOLN: 0.4000 mg | INTRAMUSCULAR | Status: DC | PRN

## 2015-02-02 MED ORDER — ACETAMINOPHEN 650 MG RE SUPP: 650.0000 mg | RECTAL | Status: DC | PRN

## 2015-02-02 MED ORDER — PROPOFOL 10 MG/ML IV BOLUS
INTRAVENOUS | Status: AC
  Filled 2015-02-02: qty 20

## 2015-02-02 MED ORDER — INSULIN ASPART 100 UNIT/ML ~~LOC~~ SOLN
0.0000 [IU] | Freq: Three times a day (TID) | SUBCUTANEOUS | Status: DC
  Administered 2015-02-03: 5 [IU] via SUBCUTANEOUS
  Administered 2015-02-03: 8 [IU] via SUBCUTANEOUS
  Administered 2015-02-04: 11 [IU] via SUBCUTANEOUS
  Administered 2015-02-04: 5 [IU] via SUBCUTANEOUS
  Administered 2015-02-05: 15 [IU] via SUBCUTANEOUS
  Administered 2015-02-05: 25 [IU] via SUBCUTANEOUS
  Administered 2015-02-06: 8 [IU] via SUBCUTANEOUS
  Administered 2015-02-06: 15 [IU] via SUBCUTANEOUS
  Administered 2015-02-06: 8 [IU] via SUBCUTANEOUS
  Administered 2015-02-07 (×2): 3 [IU] via SUBCUTANEOUS

## 2015-02-02 MED ORDER — BUPIVACAINE LIPOSOME 1.3 % IJ SUSP
20.0000 mL | INTRAMUSCULAR | Status: DC
  Filled 2015-02-02: qty 20

## 2015-02-02 MED ORDER — ONDANSETRON HCL 4 MG/2ML IJ SOLN
4.0000 mg | INTRAMUSCULAR | Status: DC | PRN
  Administered 2015-02-04: 4 mg via INTRAVENOUS
  Filled 2015-02-02: qty 2

## 2015-02-02 MED ORDER — SODIUM CHLORIDE 0.9 % IV SOLN
INTRAVENOUS | Status: DC
  Administered 2015-02-02: 21:00:00 via INTRAVENOUS

## 2015-02-02 MED ORDER — MORPHINE SULFATE 2 MG/ML IV SOLN
INTRAVENOUS | Status: AC
  Filled 2015-02-02: qty 25

## 2015-02-02 MED ORDER — PROMETHAZINE HCL 25 MG/ML IJ SOLN
6.2500 mg | INTRAMUSCULAR | Status: AC | PRN
  Administered 2015-02-02 (×2): 6.25 mg via INTRAVENOUS

## 2015-02-02 MED ORDER — PHENYLEPHRINE HCL 10 MG/ML IJ SOLN
10.0000 mg | INTRAMUSCULAR | Status: DC | PRN
  Administered 2015-02-02: 20 ug/min via INTRAVENOUS

## 2015-02-02 MED ORDER — DIPHENHYDRAMINE HCL 50 MG/ML IJ SOLN: 12.5000 mg | Freq: Four times a day (QID) | INTRAMUSCULAR | Status: DC | PRN

## 2015-02-02 MED ORDER — EPHEDRINE SULFATE 50 MG/ML IJ SOLN
INTRAMUSCULAR | Status: DC | PRN
  Administered 2015-02-02: 5 mg via INTRAVENOUS

## 2015-02-02 MED ORDER — HYDROMORPHONE HCL 1 MG/ML IJ SOLN
INTRAMUSCULAR | Status: AC
  Filled 2015-02-02: qty 1

## 2015-02-02 MED ORDER — HYDROMORPHONE HCL 1 MG/ML IJ SOLN
0.2500 mg | INTRAMUSCULAR | Status: DC | PRN
  Administered 2015-02-02 (×2): 0.25 mg via INTRAVENOUS

## 2015-02-02 MED ORDER — NEOSTIGMINE METHYLSULFATE 10 MG/10ML IV SOLN
INTRAVENOUS | Status: DC | PRN
  Administered 2015-02-02: 4 mg via INTRAVENOUS

## 2015-02-02 MED ORDER — SENNA 8.6 MG PO TABS
1.0000 | ORAL_TABLET | Freq: Two times a day (BID) | ORAL | Status: DC
  Administered 2015-02-03 – 2015-02-07 (×10): 8.6 mg via ORAL
  Filled 2015-02-02 (×10): qty 1

## 2015-02-02 MED ORDER — LACTATED RINGERS IV SOLN
INTRAVENOUS | Status: DC | PRN
  Administered 2015-02-02 (×3): via INTRAVENOUS

## 2015-02-02 MED ORDER — PROMETHAZINE HCL 25 MG/ML IJ SOLN
INTRAMUSCULAR | Status: AC
  Filled 2015-02-02: qty 1

## 2015-02-02 MED ORDER — EPHEDRINE SULFATE 50 MG/ML IJ SOLN
INTRAMUSCULAR | Status: AC
  Filled 2015-02-02: qty 1

## 2015-02-02 MED ORDER — VANCOMYCIN HCL 1000 MG IV SOLR
INTRAVENOUS | Status: AC
  Filled 2015-02-02: qty 2000

## 2015-02-02 MED ORDER — SCOPOLAMINE 1 MG/3DAYS TD PT72
MEDICATED_PATCH | TRANSDERMAL | Status: AC
  Filled 2015-02-02: qty 1

## 2015-02-02 MED ORDER — FENTANYL CITRATE (PF) 100 MCG/2ML IJ SOLN
INTRAMUSCULAR | Status: DC | PRN
  Administered 2015-02-02 (×4): 50 ug via INTRAVENOUS
  Administered 2015-02-02: 200 ug via INTRAVENOUS

## 2015-02-02 MED ORDER — ROCURONIUM BROMIDE 100 MG/10ML IV SOLN
INTRAVENOUS | Status: DC | PRN
  Administered 2015-02-02: 50 mg via INTRAVENOUS
  Administered 2015-02-02 (×2): 10 mg via INTRAVENOUS

## 2015-02-02 MED ORDER — SCOPOLAMINE 1 MG/3DAYS TD PT72
MEDICATED_PATCH | TRANSDERMAL | Status: DC | PRN
  Administered 2015-02-02: 1 via TRANSDERMAL

## 2015-02-02 MED ORDER — PHENOL 1.4 % MT LIQD: 1.0000 | OROMUCOSAL | Status: DC | PRN

## 2015-02-02 MED ORDER — GLYCOPYRROLATE 0.2 MG/ML IJ SOLN
INTRAMUSCULAR | Status: AC
  Filled 2015-02-02: qty 3

## 2015-02-02 MED ORDER — ONDANSETRON HCL 4 MG/2ML IJ SOLN
INTRAMUSCULAR | Status: AC
  Filled 2015-02-02: qty 2

## 2015-02-02 MED ORDER — HYDROXYZINE HCL 25 MG PO TABS
25.0000 mg | ORAL_TABLET | Freq: Three times a day (TID) | ORAL | Status: DC
  Administered 2015-02-03 – 2015-02-07 (×14): 25 mg via ORAL
  Filled 2015-02-02 (×14): qty 1

## 2015-02-02 MED ORDER — BISOPROLOL-HYDROCHLOROTHIAZIDE 2.5-6.25 MG PO TABS
2.0000 | ORAL_TABLET | Freq: Every day | ORAL | Status: DC
  Administered 2015-02-04 – 2015-02-05 (×2): 2 via ORAL
  Filled 2015-02-02 (×4): qty 2

## 2015-02-02 MED ORDER — LACTATED RINGERS IV SOLN
INTRAVENOUS | Status: DC
  Administered 2015-02-02: 08:00:00 via INTRAVENOUS

## 2015-02-02 MED ORDER — INSULIN ASPART 100 UNIT/ML ~~LOC~~ SOLN
0.0000 [IU] | Freq: Every day | SUBCUTANEOUS | Status: DC
  Administered 2015-02-04 (×2): 5 [IU] via SUBCUTANEOUS

## 2015-02-02 MED ORDER — ARTIFICIAL TEARS OP OINT
TOPICAL_OINTMENT | OPHTHALMIC | Status: AC
  Filled 2015-02-02: qty 3.5

## 2015-02-02 MED ORDER — MIDAZOLAM HCL 2 MG/2ML IJ SOLN: 0.5000 mg | Freq: Once | INTRAMUSCULAR | Status: DC | PRN

## 2015-02-02 MED ORDER — SODIUM CHLORIDE 0.9% FLUSH
3.0000 mL | Freq: Two times a day (BID) | INTRAVENOUS | Status: DC
  Administered 2015-02-03 – 2015-02-06 (×3): 3 mL via INTRAVENOUS

## 2015-02-02 MED ORDER — THROMBIN 20000 UNITS EX SOLR
CUTANEOUS | Status: DC | PRN
  Administered 2015-02-02: 12:00:00 via TOPICAL

## 2015-02-02 MED ORDER — SPIRONOLACTONE 25 MG PO TABS
25.0000 mg | ORAL_TABLET | Freq: Every day | ORAL | Status: DC
  Administered 2015-02-03 – 2015-02-05 (×4): 25 mg via ORAL
  Filled 2015-02-02 (×4): qty 1

## 2015-02-02 MED ORDER — PROPOFOL 10 MG/ML IV BOLUS
INTRAVENOUS | Status: DC | PRN
  Administered 2015-02-02: 120 mg via INTRAVENOUS

## 2015-02-02 MED ORDER — SODIUM CHLORIDE 0.9 % IV SOLN: 250.0000 mL | INTRAVENOUS | Status: DC

## 2015-02-02 MED ORDER — LISINOPRIL 20 MG PO TABS
20.0000 mg | ORAL_TABLET | Freq: Every day | ORAL | Status: DC
  Administered 2015-02-04 – 2015-02-05 (×2): 20 mg via ORAL
  Filled 2015-02-02 (×3): qty 1

## 2015-02-02 MED ORDER — SODIUM CHLORIDE 0.9% FLUSH: 3.0000 mL | INTRAVENOUS | Status: DC | PRN

## 2015-02-02 SURGICAL SUPPLY — 63 items
BENZOIN TINCTURE PRP APPL 2/3 (GAUZE/BANDAGES/DRESSINGS) ×2 IMPLANT
BLADE CLIPPER SURG (BLADE) IMPLANT
BUR ACORN 6.0 (BURR) ×2 IMPLANT
BUR MATCHSTICK NEURO 3.0 LAGG (BURR) ×2 IMPLANT
CANISTER SUCT 3000ML PPV (MISCELLANEOUS) ×2 IMPLANT
CAP LOCKING THREADED (Cap) ×8 IMPLANT
CONT SPEC 4OZ CLIKSEAL STRL BL (MISCELLANEOUS) ×2 IMPLANT
COVER BACK TABLE 60X90IN (DRAPES) ×2 IMPLANT
CROSSLINK SPINAL FUSION (Cage) ×2 IMPLANT
DERMABOND ADVANCED (GAUZE/BANDAGES/DRESSINGS) ×1
DERMABOND ADVANCED .7 DNX12 (GAUZE/BANDAGES/DRESSINGS) ×1 IMPLANT
DRAPE C-ARM 42X72 X-RAY (DRAPES) ×4 IMPLANT
DRAPE LAPAROTOMY 100X72X124 (DRAPES) ×2 IMPLANT
DRAPE MICROSCOPE LEICA (MISCELLANEOUS) ×2 IMPLANT
DRAPE POUCH INSTRU U-SHP 10X18 (DRAPES) ×2 IMPLANT
DRSG PAD ABDOMINAL 8X10 ST (GAUZE/BANDAGES/DRESSINGS) IMPLANT
DURAPREP 26ML APPLICATOR (WOUND CARE) ×2 IMPLANT
ELECT BLADE 4.0 EZ CLEAN MEGAD (MISCELLANEOUS) ×2
ELECT REM PT RETURN 9FT ADLT (ELECTROSURGICAL) ×2
ELECTRODE BLDE 4.0 EZ CLN MEGD (MISCELLANEOUS) ×1 IMPLANT
ELECTRODE REM PT RTRN 9FT ADLT (ELECTROSURGICAL) ×1 IMPLANT
EVACUATOR 1/8 PVC DRAIN (DRAIN) IMPLANT
GAUZE SPONGE 4X4 12PLY STRL (GAUZE/BANDAGES/DRESSINGS) ×2 IMPLANT
GAUZE SPONGE 4X4 16PLY XRAY LF (GAUZE/BANDAGES/DRESSINGS) ×2 IMPLANT
GLOVE BIO SURGEON STRL SZ7 (GLOVE) ×2 IMPLANT
GLOVE BIOGEL M 8.0 STRL (GLOVE) ×2 IMPLANT
GLOVE BIOGEL PI IND STRL 7.5 (GLOVE) ×1 IMPLANT
GLOVE BIOGEL PI INDICATOR 7.5 (GLOVE) ×1
GOWN STRL REUS W/ TWL LRG LVL3 (GOWN DISPOSABLE) ×2 IMPLANT
GOWN STRL REUS W/ TWL XL LVL3 (GOWN DISPOSABLE) ×2 IMPLANT
GOWN STRL REUS W/TWL 2XL LVL3 (GOWN DISPOSABLE) IMPLANT
GOWN STRL REUS W/TWL LRG LVL3 (GOWN DISPOSABLE) ×2
GOWN STRL REUS W/TWL XL LVL3 (GOWN DISPOSABLE) ×2
KIT BASIN OR (CUSTOM PROCEDURE TRAY) ×2 IMPLANT
KIT INFUSE MEDIUM (Orthopedic Implant) ×2 IMPLANT
KIT ROOM TURNOVER OR (KITS) ×2 IMPLANT
NEEDLE HYPO 18GX1.5 BLUNT FILL (NEEDLE) IMPLANT
NEEDLE HYPO 21X1.5 SAFETY (NEEDLE) ×2 IMPLANT
NEEDLE HYPO 25X1 1.5 SAFETY (NEEDLE) IMPLANT
NS IRRIG 1000ML POUR BTL (IV SOLUTION) ×2 IMPLANT
PACK FOAM VITOSS 10CC (Orthopedic Implant) ×2 IMPLANT
PACK LAMINECTOMY NEURO (CUSTOM PROCEDURE TRAY) ×2 IMPLANT
PAD ARMBOARD 7.5X6 YLW CONV (MISCELLANEOUS) ×6 IMPLANT
PATTIES SURGICAL .5 X1 (DISPOSABLE) ×2 IMPLANT
PATTIES SURGICAL .5 X3 (DISPOSABLE) IMPLANT
ROD 40MM SPINAL (Rod) ×4 IMPLANT
RUBBERBAND STERILE (MISCELLANEOUS) ×4 IMPLANT
SCREW SPINE 40X5.5XPA CREO (Screw) ×4 IMPLANT
SCREW SPINE CREO 5.5X40 (Screw) ×4 IMPLANT
SPONGE LAP 4X18 X RAY DECT (DISPOSABLE) IMPLANT
SPONGE NEURO XRAY DETECT 1X3 (DISPOSABLE) IMPLANT
SPONGE SURGIFOAM ABS GEL 100 (HEMOSTASIS) ×2 IMPLANT
STAPLER VISISTAT 35W (STAPLE) ×2 IMPLANT
STRIP CLOSURE SKIN 1/2X4 (GAUZE/BANDAGES/DRESSINGS) ×2 IMPLANT
SUT NURALON 4 0 TR CR/8 (SUTURE) ×2 IMPLANT
SUT VIC AB 1 CT1 18XBRD ANBCTR (SUTURE) ×1 IMPLANT
SUT VIC AB 1 CT1 8-18 (SUTURE) ×1
SUT VIC AB 2-0 CP2 18 (SUTURE) ×2 IMPLANT
SUT VIC AB 3-0 SH 8-18 (SUTURE) ×2 IMPLANT
TOWEL OR 17X24 6PK STRL BLUE (TOWEL DISPOSABLE) ×2 IMPLANT
TOWEL OR 17X26 10 PK STRL BLUE (TOWEL DISPOSABLE) ×2 IMPLANT
TRAY FOLEY W/METER SILVER 14FR (SET/KITS/TRAYS/PACK) ×2 IMPLANT
WATER STERILE IRR 1000ML POUR (IV SOLUTION) ×2 IMPLANT

## 2015-02-02 NOTE — Transfer of Care (Signed)
Immediate Anesthesia Transfer of Care Note  Patient: Courtney [name]  Procedure(s) Performed: Procedure(s): Lumbar Three-Four Posterior lumbar interbody fusion (N/A)  Patient Location: PACU  Anesthesia Type:General  Level of Consciousness: awake, alert , oriented and patient cooperative  Airway & Oxygen Therapy: Patient Spontanous Breathing and Patient connected to nasal cannula oxygen  Post-op Assessment: Report given to RN, Post -op Vital signs reviewed and stable and Patient moving all extremities  Post vital signs: Reviewed and stable  Last Vitals:  Filed Vitals:   02/02/15 0802  BP: 149/45  Pulse: 87  Temp: 37 C  Resp: 20    Complications: No apparent anesthesia complications

## 2015-02-02 NOTE — Anesthesia Preprocedure Evaluation (Addendum)
Anesthesia Evaluation  Patient identified by MRN, date of birth, ID band Patient awake    Reviewed: Allergy & Precautions, NPO status , Patient's Chart, lab work & pertinent test results  History of Anesthesia Complications (+) PONV and history of anesthetic complications  Airway Mallampati: II  TM Distance: >3 FB Neck ROM: Full    Dental  (+) Dental Advisory Given, Edentulous Upper, Partial Lower   Pulmonary sleep apnea (does not use her CPAP) ,    breath sounds clear to auscultation       Cardiovascular hypertension, Pt. on medications and Pt. 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), Type 2, Insulin Dependent  Renal/GU      Musculoskeletal  (+) Arthritis , Rheumatoid disorders,    Abdominal   Peds  Hematology   Anesthesia Other Findings   Reproductive/Obstetrics                          Anesthesia Physical Anesthesia Plan  ASA: III  Anesthesia Plan: General   Post-op Pain Management:    Induction: Intravenous  Airway Management Planned: Oral ETT  Additional Equipment:   Intra-op Plan:   Post-operative Plan: Extubation in OR  Informed Consent: I have reviewed the patients History and Physical, chart, labs and discussed the procedure including the risks, benefits and alternatives for the proposed anesthesia with the patient or authorized representative who has indicated his/her understanding and acceptance.   Dental advisory given  Plan Discussed with: CRNA, Anesthesiologist and Surgeon  Anesthesia Plan Comments: (Plan routine monitors, GETA)       Anesthesia Quick Evaluation

## 2015-02-02 NOTE — Anesthesia Procedure Notes (Signed)
Procedure Name: Intubation Date/Time: 02/02/2015 10:45 AM Performed by: Garrison Columbus T Pre-anesthesia Checklist: Patient identified, Emergency Drugs available, Suction available and Patient being monitored Patient Re-evaluated:Patient Re-evaluated prior to inductionOxygen Delivery Method: Circle system utilized Preoxygenation: Pre-oxygenation with 100% oxygen Intubation Type: IV induction Ventilation: Mask ventilation without difficulty and Oral airway inserted - appropriate to patient size Laryngoscope Size: Sabra Heck and 2 Grade View: Grade I Tube type: Oral Tube size: 7.0 mm Number of attempts: 1 Airway Equipment and Method: Stylet and Oral airway Placement Confirmation: ETT inserted through vocal cords under direct vision,  positive ETCO2 and breath sounds checked- equal and bilateral Secured at: 22 cm Tube secured with: Tape Dental Injury: Teeth and Oropharynx as per pre-operative assessment

## 2015-02-02 NOTE — Progress Notes (Signed)
Patient's blood sugar in 60's when she checked at home prior to arriving to short stay.  Patient had 1/2 cup of cranberry juice.  Patient did take 15 units of Humulin N at 0600.  Will continue to monitor patient.

## 2015-02-02 NOTE — Progress Notes (Signed)
Utilization review completed.  

## 2015-02-03 DIAGNOSIS — Z9889 Other specified postprocedural states: Secondary | ICD-10-CM

## 2015-02-03 DIAGNOSIS — Z419 Encounter for procedure for purposes other than remedying health state, unspecified: Secondary | ICD-10-CM | POA: Insufficient documentation

## 2015-02-03 DIAGNOSIS — G8918 Other acute postprocedural pain: Secondary | ICD-10-CM | POA: Insufficient documentation

## 2015-02-03 DIAGNOSIS — M4726 Other spondylosis with radiculopathy, lumbar region: Secondary | ICD-10-CM

## 2015-02-03 DIAGNOSIS — D696 Thrombocytopenia, unspecified: Secondary | ICD-10-CM | POA: Insufficient documentation

## 2015-02-03 DIAGNOSIS — E119 Type 2 diabetes mellitus without complications: Secondary | ICD-10-CM | POA: Insufficient documentation

## 2015-02-03 LAB — GLUCOSE, CAPILLARY
Glucose-Capillary: 171 mg/dL — ABNORMAL HIGH (ref 65–99)
Glucose-Capillary: 181 mg/dL — ABNORMAL HIGH (ref 65–99)
Glucose-Capillary: 177 mg/dL — ABNORMAL HIGH (ref 65–99)
Glucose-Capillary: 299 mg/dL — ABNORMAL HIGH (ref 65–99)
Glucose-Capillary: 233 mg/dL — ABNORMAL HIGH (ref 65–99)
Glucose-Capillary: 223 mg/dL — ABNORMAL HIGH (ref 65–99)

## 2015-02-03 MED ORDER — OXYCODONE HCL 5 MG PO TABS
10.0000 mg | ORAL_TABLET | ORAL | Status: DC | PRN
  Administered 2015-02-03 (×2): 5 mg via ORAL
  Administered 2015-02-03 – 2015-02-05 (×5): 10 mg via ORAL
  Administered 2015-02-06: 5 mg via ORAL
  Administered 2015-02-06 – 2015-02-07 (×2): 10 mg via ORAL
  Filled 2015-02-03 (×10): qty 2

## 2015-02-03 MED FILL — Heparin Sodium (Porcine) Inj 1000 Unit/ML: INTRAMUSCULAR | Qty: 30 | Status: AC

## 2015-02-03 MED FILL — Sodium Chloride IV Soln 0.9%: INTRAVENOUS | Qty: 2000 | Status: AC

## 2015-02-03 NOTE — Progress Notes (Signed)
   02/03/15 0800 02/03/15 0900  Mechanical VTE Prophylaxis (All Areas)  Mechanical VTE Prophylaxis Sequential compression devices, below knee;Bilateral lower extremities Sequential compression devices, below knee;Bilateral lower extremities  Mechanical VTE Prophylaxis Intervention SCD's on, removed for 30 minutes On  Pressure Ulcer Prevention  Repositioned Sitting Supine  Positioning Frequency Able to turn self Able to turn self  Mobility  Activity Ambulate in room;Chair Back to bed  Level of Assistance Minimal assist, patient does 75% or more Contact guard assist, steadying assist  Assistive Device Front wheel walker Front wheel walker  Distance Ambulated (ft) 10 ft --   Ambulation Response Tolerated fair --   Bed Position Chair Semi-fowlers  Range of Motion Active;All extremities Active;All extremities

## 2015-02-03 NOTE — Evaluation (Signed)
Occupational Therapy Evaluation Patient Details Name: Courtney [name] MRN: [id number] DOB: Apr 17, 1940 Today's Date: 02/03/2015    History of Present Illness Pt is a 75 y/o female who presents s/p L3-L4 PLIF on 02/02/15.   Clinical Impression   Pt reports she was independent with ADLs PTA. Currently pt is overall min assist-min guard for ADLs and functional mobility with the exception of mod assist for LB ADLs. Began safety, back, and ADL education with pt; she verbalized understanding. Pt able to verbally recall 3/3 back precautions but has difficulty maintaining throughout functional activities. Feel pt would benefit from CIR level therapies to maximize independence and safety with ADLs and functional mobility prior to return home. Pt would benefit from continued skilled OT to increase independence and safety with LB ADLs, toilet transfers, grooming in standing, and maintaining back precautions throughout all functional activities.    Follow Up Recommendations  CIR;Supervision/Assistance - 24 hour    Equipment Recommendations  None recommended by OT    Recommendations for Other Services       Precautions / Restrictions Precautions Precautions: Fall;Back Precaution Booklet Issued: No Precaution Comments: Pt able to recall 3/3 back precautions. VCs throughout functional activities to maintain. Required Braces or Orthoses: Spinal Brace Spinal Brace: Lumbar corset;Applied in sitting position Restrictions Weight Bearing Restrictions: No      Mobility Bed Mobility Overal bed mobility: Needs Assistance Bed Mobility: Rolling;Sidelying to Sit;Sit to Supine Rolling: Min guard Sidelying to sit: Min assist   Sit to supine: Min assist   General bed mobility comments: VCs for sequencing and technique. Rolling and sidelying>sit better than sit>sidelying. Use of rails and HOB slightly elevated.  Transfers Overall transfer level: Needs assistance Equipment used: Rolling walker (2  wheeled) Transfers: Sit to/from Stand Sit to Stand: Min assist;Min guard         General transfer comment: Min assist to boost up from EOB, min guard for sit to stand from [facility]. VCs for hand placement and technique.    Balance Overall balance assessment: Needs assistance Sitting-balance support: Feet supported;No upper extremity supported Sitting balance-Leahy Scale: Fair     Standing balance support: No upper extremity supported;During functional activity Standing balance-Leahy Scale: Poor Standing balance comment: Min assist for balance in standing with no UE support                            ADL Overall ADL's : Needs assistance/impaired Eating/Feeding: Set up;Sitting   Grooming: Min guard;Standing;Cueing for safety Grooming Details (indicate cue type and reason): Cues to maintain back precautions     Lower Body Bathing: Moderate assistance;Sit to/from stand   Upper Body Dressing : Set up;Sitting Upper Body Dressing Details (indicate cue type and reason): Pt able to don/doff back brace sitting EOB with set up. Lower Body Dressing: Moderate assistance;Sitting/lateral leans;Cueing for back precautions Lower Body Dressing Details (indicate cue type and reason): Pt unable to cross foot over opposite knee; pt reports husband can assist as needed upon return home. Educated on compensatory strategies for LB ADLs. Toilet Transfer: Min guard;Ambulation;BSC;RW (BSC over toilet)   Toileting- Clothing Manipulation and Hygiene: Min guard;Sitting/lateral lean (for toilet hygiene)       Functional mobility during ADLs: Min guard;Rolling walker (Close min guard) General ADL Comments: Pts sister present for OT eval. Educated on back precautions, bed mobility technique, use of incentive spirometer; pt verbalized understanding.     Vision     Perception     Praxis  Pertinent Vitals/Pain Pain Assessment: Faces Faces Pain Scale: Hurts even more Pain Location: Back, R  leg Pain Descriptors / Indicators: Aching;Grimacing;Guarding Pain Intervention(s): Limited activity within patient's tolerance;Monitored during session     Hand Dominance Right   Extremity/Trunk Assessment Upper Extremity Assessment Upper Extremity Assessment: Overall WFL for tasks assessed   Lower Extremity Assessment Lower Extremity Assessment: Defer to PT evaluation   Cervical / Trunk Assessment Cervical / Trunk Assessment: Kyphotic   Communication Communication Communication: No difficulties   Cognition Arousal/Alertness: Awake/alert Behavior During Therapy: WFL for tasks assessed/performed Overall Cognitive Status: Within Functional Limits for tasks assessed                     General Comments       Exercises       Shoulder Instructions      Home Living Family/patient expects to be discharged to:: Inpatient rehab Living Arrangements: Spouse/significant other Available Help at Discharge: Family;Available 24 hours/day Type of Home: House Home Access: Stairs to enter [employer] of Steps: 2   Home Layout: One level     Bathroom Shower/Tub: Occupational psychologist: Standard Bathroom Accessibility: Yes How Accessible: Accessible via walker Home Equipment: Gateway - 2 wheels;Cane - quad;Cane - single point;Shower seat - built in;Bedside commode          Prior Functioning/Environment Level of Independence: Independent with assistive device(s)             OT Diagnosis: Generalized weakness;Acute pain   OT Problem List: Decreased strength;Impaired balance (sitting and/or standing);Decreased safety awareness;Decreased knowledge of use of DME or AE;Decreased knowledge of precautions;Pain   OT Treatment/Interventions: Self-care/ADL training;DME and/or AE instruction;Energy conservation;Patient/family education    OT Goals(Current goals can be found in the care plan section) Acute Rehab OT Goals Patient Stated Goal: Rehab before  home OT Goal Formulation: With patient Time For Goal Achievement: 02/17/15 Potential to Achieve Goals: Good ADL Goals Pt Will Perform Grooming: with supervision;standing Pt Will Perform Lower Body Bathing: with supervision;with adaptive equipment;sit to/from stand Pt Will Perform Lower Body Dressing: with supervision;with adaptive equipment;sit to/from stand Pt Will Transfer to Toilet: with supervision;ambulating;bedside commode (over toilet) Pt Will Perform Toileting - Clothing Manipulation and hygiene: with supervision;sit to/from stand Additional ADL Goal #1: Pt will independently maintain back precautions during ADL activity.  OT Frequency: Min 2X/week   Barriers to D/C:            Co-evaluation              End of Session Equipment Utilized During Treatment: Gait belt;Rolling walker;Back brace  Activity Tolerance: Patient tolerated treatment well Patient left: in bed;with call bell/phone within reach;with family/visitor present;with SCD's reapplied   Time: [id number] OT Time Calculation (min): 19 min Charges:  OT General Charges $OT Visit: 1 Procedure OT Evaluation $OT Eval Moderate Complexity: 1 Procedure G-Codes:     Binnie Kand M.S., OTR/L Pager: [phone]  02/03/2015, 2:56 PM

## 2015-02-03 NOTE — Progress Notes (Signed)
OT Cancellation Note  Patient Details Name: MAIGAN BREMNER MRN: [id number] DOB: [birth year]   Cancelled Treatment:    Reason Eval/Treat Not Completed: Fatigue/lethargy limiting ability to participate. Pt finishing working with PT at this time; pt noticeably fatigued with SOB. Will follow up for OT eval as time allows.   Binnie Kand M.S., OTR/L Pager: [phone]  02/03/2015, 10:28 AM

## 2015-02-03 NOTE — Evaluation (Signed)
Physical Therapy Evaluation Patient Details Name: Courtney [name] MRN: [id number] DOB: [birth year] Today's Date: 02/03/2015   History of Present Illness  Pt is a 75 y/o female who presents s/p L3-L4 PLIF on 02/02/15.  Clinical Impression  Pt admitted with above diagnosis. Pt currently with functional limitations due to the deficits listed below (see PT Problem List). At the time of PT eval pt was able to perform transfers and ambulation with min assist for safety and balance support. Pt with low tolerance for functional activity at this time, and was extremely fatigued by end of gait training. Pt and family report that they would like to try for CIR at d/c. Discussed the process of CIR and explained options if that rehab venue was not able to accept her. Although pt fatigued quickly with OOB, feel she put forth a good rehab effort and would be willing to participate in the higher frequency of therapy at CIR.       Follow Up Recommendations CIR;Supervision for mobility/OOB    Equipment Recommendations  None recommended by PT    Recommendations for Other Services Rehab consult     Precautions / Restrictions Precautions Precautions: Fall;Back Precaution Comments: Discussed back precautions during functional mobility.  Required Braces or Orthoses: Spinal Brace Spinal Brace: Lumbar corset;Applied in sitting position Restrictions Weight Bearing Restrictions: No      Mobility  Bed Mobility Overal bed mobility: Needs Assistance Bed Mobility: Rolling;Sidelying to Sit;Sit to Supine Rolling: Min guard Sidelying to sit: Min assist   Sit to supine: Min assist   General bed mobility comments: Pt with difficulty sequencing for log roll. VC's throughout for technique. Pt was instructed for sit>sidelying however instead performed sit>supine. Encouraged log roll.   Transfers Overall transfer level: Needs assistance Equipment used: Rolling walker (2 wheeled) Transfers: Sit to/from Stand Sit to  Stand: Min assist         General transfer comment: Min assist from elevated surface. Pt was unable to achieve standing from EOB in lowest position.   Ambulation/Gait Ambulation/Gait assistance: Min assist Ambulation Distance (Feet): 75 Feet Assistive device: Rolling walker (2 wheeled) Gait Pattern/deviations: Step-through pattern;Decreased stride length;Trunk flexed Gait velocity: Decreased Gait velocity interpretation: Below normal speed for age/gender General Gait Details: VC's for sequencing and safety with the RW. Pt pointed out landmark in the hall that she wanted to walk to. Pt extremely fatigued at end of gait training and required min assist to make it back to the room.   Stairs            Wheelchair Mobility    Modified Rankin (Stroke Patients Only)       Balance Overall balance assessment: Needs assistance Sitting-balance support: Feet supported;No upper extremity supported Sitting balance-Leahy Scale: Fair     Standing balance support: Bilateral upper extremity supported;During functional activity Standing balance-Leahy Scale: Poor                               Pertinent Vitals/Pain Pain Assessment: Faces Faces Pain Scale: Hurts even more Pain Location: Back/Incision Pain Descriptors / Indicators: Operative site guarding;Discomfort Pain Intervention(s): Limited activity within patient's tolerance;Monitored during session;Repositioned    Home Living Family/patient expects to be discharged to:: Inpatient rehab Living Arrangements: Spouse/significant other Available Help at Discharge: Family;Available 24 hours/day Type of Home: House Home Access: Stairs to enter   [employer] of Steps: 2 Home Layout: One level Home Equipment: Walker - 2 wheels;Cane - quad;Cane -  single point;Shower seat - built in;Bedside commode      Prior Function Level of Independence: Independent with assistive device(s)               Hand  Dominance   Dominant Hand: Right    Extremity/Trunk Assessment   Upper Extremity Assessment: Defer to OT evaluation           Lower Extremity Assessment: Generalized weakness      Cervical / Trunk Assessment: Kyphotic  Communication   Communication: No difficulties  Cognition Arousal/Alertness: Awake/alert Behavior During Therapy: WFL for tasks assessed/performed Overall Cognitive Status: Within Functional Limits for tasks assessed                      General Comments      Exercises        Assessment/Plan    PT Assessment Patient needs continued PT services  PT Diagnosis Difficulty walking;Acute pain   PT Problem List Decreased strength;Decreased range of motion;Decreased activity tolerance;Decreased balance;Decreased mobility;Decreased knowledge of use of DME;Decreased knowledge of precautions;Decreased safety awareness;Pain  PT Treatment Interventions DME instruction;Gait training;Stair training;Functional mobility training;Therapeutic activities;Therapeutic exercise;Neuromuscular re-education;Patient/family education   PT Goals (Current goals can be found in the Care Plan section) Acute Rehab PT Goals Patient Stated Goal: Rehab at d/c PT Goal Formulation: With patient/family Time For Goal Achievement: 02/17/15 Potential to Achieve Goals: Good    Frequency Min 5X/week   Barriers to discharge        Co-evaluation               End of Session Equipment Utilized During Treatment: Back brace Activity Tolerance: Patient limited by fatigue Patient left: in bed;with call bell/phone within reach;with family/visitor present Nurse Communication: Mobility status         Time: [id number] PT Time Calculation (min) (ACUTE ONLY): 29 min   Charges:   PT Evaluation $PT Eval Moderate Complexity: 1 Procedure PT Treatments $Gait Training: 8-22 mins   PT G Codes:        Rolinda Roan 2015-02-14, 10:52 AM   Rolinda Roan, PT, DPT Acute  Rehabilitation Services Pager: [phone]

## 2015-02-03 NOTE — Progress Notes (Signed)
Patient ID: Courtney [name], female   DOB: [birth year], 75 y.o.   MRN: [id number] doing well, some leg spasms. Wants to be off morphine

## 2015-02-03 NOTE — Progress Notes (Signed)
Inpatient Rehabilitation  PT has sent a prescreen request and MD has ordered IP Rehab consult.  We will await the recommendations from the consult and the admissions coordinator will follow up as appropriate.    [place] Admissions Coordinator Cell [phone] Office [phone]

## 2015-02-03 NOTE — Consult Note (Signed)
Physical Medicine and Rehabilitation Consult Reason for Consult: Lumbar stenosis with bilateral radiculopathy Referring Physician: Dr Joya Salm  HPI: Courtney [name] is a 75 y.o. right handed female with history of rheumatoid arthritis, DM, lumbar laminectomy L3-4, L4-5, L5-S1 decompression 12/24/2013. Patient lives with spouse independent with assistive device prior to admission. Two level home with 2 steps to entry. Presented 02/01/2015 with low back pain radiating to the left lower extremity. X-rays and imaging revealed lumbar L3-4 large herniated disc with compromise of the thecal sac,bilateral radiculopathy. Underwent L3 removal spinous process and lamina, bilateral facetectomy, lysis of adhesion to decompresse thecal sac, pedicle screws L3-4 with arthrodesis 02/02/2015 per Dr. Joya Salm. Lumbar corset when out of bed applied in sitting position. Hospital course pain management. Physical and occupational therapy evaluation completed 02/03/2015 with recommendations of physical medicine and rehabilitation consult.  Review of Systems  Constitutional: Negative for fever and chills.  Eyes: Negative for blurred vision and double vision.  Respiratory: Negative for cough and shortness of breath.   Cardiovascular: Negative for chest pain, palpitations and leg swelling.  Gastrointestinal: Positive for constipation. Negative for nausea and vomiting.       GERD  Genitourinary: Positive for urgency and frequency.  Musculoskeletal: Positive for myalgias and back pain.  Skin: Negative for rash.  Neurological: Positive for headaches. Negative for weakness.  All other systems reviewed and are negative.  Past Medical History  Diagnosis Date  . Hypertension   . Arthritis     rheumatoid arthritis  . Diabetes mellitus without complication ([place])     type 2  . Varicose veins   . Sleep apnea 2004    does not use cpap  . GERD (gastroesophageal reflux disease)   . Headache     migraines in the past  (none since menopause)  . Hives     "chronic"  . Dermatitis   . Rosacea   . Eczema   . PONV (postoperative nausea and vomiting)   . Cancer ([place])     skin cancer (squamous cell)  . Collagen vascular disease ([place])     RA   Past Surgical History  Procedure Laterality Date  . Eye surgery Bilateral 2006 and 2012    cataract surgery with lens implant  . Cholecystectomy  1963  . Tubal ligation  1968  . Endometrial ablation  1991  . Foot surgery Right     ligament and spurs  . Shoulder surgery Right     for a frozen shoulder  . Nasal septum surgery  2004  . Bilateral carpal tunnel release    . Skin cancer excision Right     leg x 4  . Eyelid surgery Bilateral 2014  . Lumbar laminectomy/decompression microdiscectomy Right 12/24/2013    Procedure: RIGHT LUMBAR THREE TO FOUR, LUMBAR FOUR TO FIVE, LUMBAR FIVE TO SACRAL ONE LAMINECTOMY/FORAMINOTOMY;  Surgeon: Floyce Stakes, MD;  Location: [facility];  Service: Neurosurgery;  Laterality: Right;  RIGHT L3-4 L4-5 L5-S1 LAMINECTOMY/FORAMINOTOMY  . Breast cyst aspiration Left     neg   Family History  Problem Relation Age of Onset  . COPD Mother   . Arthritis/Rheumatoid Mother   . Alzheimer's disease Father   . Heart attack Father   . Breast cancer Paternal Aunt 35   Social History:  reports that she has never smoked. She has never used smokeless tobacco. She reports that she does not drink alcohol or use illicit drugs. Allergies:  Allergies  Allergen Reactions  .  Methotrexate Derivatives Shortness Of Breath    Breathing difficulties   . Acyclovir And Related   . Codeine Nausea Only  . Contrast Media [Iodinated Diagnostic Agents] Itching and Rash  . Inderide [Propranolol-Hctz] Rash  . Lodine [Etodolac] Rash  . Procardia [Nifedipine] Rash   Medications Prior to Admission  Medication Sig Dispense Refill  . aspirin EC 81 MG tablet Take 81 mg by mouth daily.    . bisoprolol-hydrochlorothiazide (ZIAC) 2.5-6.25 MG per tablet  Take 2 tablets by mouth daily.    Marland Kitchen doxazosin (CARDURA) 8 MG tablet Take 8 mg by mouth daily.     . hydrOXYzine (ATARAX/VISTARIL) 25 MG tablet Take 25 mg by mouth 3 (three) times daily.    . insulin NPH Human (HUMULIN N,NOVOLIN N) 100 UNIT/ML injection Inject 20-30 Units into the skin 2 (two) times daily. 30-40 units twice per day    . insulin regular (NOVOLIN R,HUMULIN R) 100 units/mL injection Inject 5-15 Units into the skin 2 (two) times daily. 5-15 units twice per day    . leflunomide (ARAVA) 10 MG tablet Take 10 mg by mouth daily.    Marland Kitchen levocetirizine (XYZAL) 5 MG tablet Take 5 mg by mouth every evening.    Marland Kitchen lisinopril (PRINIVIL,ZESTRIL) 40 MG tablet Take 20 mg by mouth daily.     Marland Kitchen omeprazole (PRILOSEC) 20 MG capsule Take 20 mg by mouth daily.    . ranitidine (ZANTAC) 150 MG tablet Take 150 mg by mouth 2 (two) times daily.    . simvastatin (ZOCOR) 40 MG tablet Take 40 mg by mouth daily.     Marland Kitchen spironolactone (ALDACTONE) 25 MG tablet Take 25 mg by mouth daily.    . hydrochlorothiazide (HYDRODIURIL) 25 MG tablet Take 25 mg by mouth daily.    Marland Kitchen inFLIXimab (REMICADE) 100 MG injection Inject 500 mg into the vein every 6 (six) weeks.      Home: Home Living Family/patient expects to be discharged to:: Inpatient rehab Living Arrangements: Spouse/significant other Available Help at Discharge: Family, Available 24 hours/day Type of Home: House Home Access: Stairs to enter [employer] of Steps: 2 Home Layout: One level Bathroom Shower/Tub: Multimedia programmer: Standard Bathroom Accessibility: Yes Home Equipment: Environmental consultant - 2 wheels, Cane - quad, Radio producer - single point, Civil engineer, contracting - built in, Bedside commode  Functional History: Prior Function Level of Independence: Independent with assistive device(s) Functional Status:  Mobility: Bed Mobility Overal bed mobility: Needs Assistance Bed Mobility: Rolling, Sidelying to Sit, Sit to Supine Rolling: Min guard Sidelying to  sit: Min assist Sit to supine: Min assist General bed mobility comments: Pt with difficulty sequencing for log roll. VC's throughout for technique. Pt was instructed for sit>sidelying however instead performed sit>supine. Encouraged log roll.  Transfers Overall transfer level: Needs assistance Equipment used: Rolling walker (2 wheeled) Transfers: Sit to/from Stand Sit to Stand: Min assist General transfer comment: Min assist from elevated surface. Pt was unable to achieve standing from EOB in lowest position.  Ambulation/Gait Ambulation/Gait assistance: Min assist Ambulation Distance (Feet): 75 Feet Assistive device: Rolling walker (2 wheeled) Gait Pattern/deviations: Step-through pattern, Decreased stride length, Trunk flexed General Gait Details: VC's for sequencing and safety with the RW. Pt pointed out landmark in the hall that she wanted to walk to. Pt extremely fatigued at end of gait training and required min assist to make it back to the room.  Gait velocity: Decreased Gait velocity interpretation: Below normal speed for age/gender    ADL:  Cognition: Cognition Overall Cognitive Status: Within Functional Limits for tasks assessed Orientation Level: Oriented X4 Cognition Arousal/Alertness: Awake/alert Behavior During Therapy: WFL for tasks assessed/performed Overall Cognitive Status: Within Functional Limits for tasks assessed  Blood pressure 144/64, pulse 94, temperature 99 F (37.2 C), temperature source Oral, resp. rate 14, height 5\' 8"  (1.727 m), weight 88.451 kg (195 lb), SpO2 97 %. Physical Exam  Vitals reviewed. Constitutional: She is oriented to person, place, and time. She appears well-developed and well-nourished.  HENT:  Head: Normocephalic and atraumatic.  Eyes: Conjunctivae and EOM are normal.  Neck: Normal range of motion. Neck supple. No thyromegaly present.  Cardiovascular: Normal rate and regular rhythm.   Murmur heard. Respiratory: Effort normal  and breath sounds normal. No respiratory distress.  GI: Soft. Bowel sounds are normal. She exhibits no distension.  Musculoskeletal: She exhibits no edema or tenderness.  Decreased ROM left shoulder (hx of RA)  Neurological: She is alert and oriented to person, place, and time.  Sensation intact to light touch Motor: B/l UE 4+/5 RLE: hip flexion 4-/5, knee extension 4/5, ankle dorsi/plantar flexion 5/5 LLE: 4/5, knee extension 4+/5, ankle dorsi/plantar flexion  Skin: Skin is warm and dry.  Back incision is dressed  Psychiatric: She has a normal mood and affect. Her behavior is normal. Thought content normal.    Results for orders placed or performed during the hospital encounter of 02/02/15 (from the past 24 hour(s))  Glucose, capillary     Status: Abnormal   Collection Time: 02/03/15  1:22 AM  Result Value Ref Range   Glucose-Capillary 171 (H) 65 - 99 mg/dL  Glucose, capillary     Status: Abnormal   Collection Time: 02/03/15  4:02 AM  Result Value Ref Range   Glucose-Capillary 181 (H) 65 - 99 mg/dL   Comment 1 Notify RN    Comment 2 Document in Chart   Glucose, capillary     Status: Abnormal   Collection Time: 02/03/15  7:50 AM  Result Value Ref Range   Glucose-Capillary 177 (H) 65 - 99 mg/dL   Dg Lumbar Spine 2-3 Views  02/02/2015  CLINICAL DATA:  L3-L4 PLIF EXAM: DG C-ARM 61-120 MIN; LUMBAR SPINE - 2-3 VIEW COMPARISON:  CT lumbar spine 01/13/2015 FLUOROSCOPY TIME:  0 minutes 12 seconds Images obtained: 2 FINDINGS: Five lumbar vertebra labeled on prior lumbar CT. Disc space narrowing L3-L4. BILATERAL pedicle screws have been placed at L3 and L4. Bones appear demineralized. Vertebral body heights maintained. IMPRESSION: Intraoperative images during L3-L4 posterior fusion as above. Electronically Signed   By: Lavonia Dana M.D.   On: 02/02/2015 15:39   Dg Lumbar Spine 2-3 Views  02/02/2015  CLINICAL DATA:  L3-4 PLIF EXAM: LUMBAR SPINE - 2-3 VIEW COMPARISON:  01/13/2015 FINDINGS: Three  intraoperative views of the lumbar spine were obtained in the lateral projection. The initial images demonstrate surgical instruments posterior to the L3-4 interspace and L4 vertebral body. Subsequently surgical retractors are noted in place with surgical instrument extending into the spinal canal at the L3-4 level. The numbering nomenclature is similar to that utilized on the prior CT examination. IMPRESSION: Intraoperative localization at L3-4. Electronically Signed   By: Inez Catalina M.D.   On: 02/02/2015 12:15   Dg C-arm 1-60 Min  02/02/2015  CLINICAL DATA:  L3-L4 PLIF EXAM: DG C-ARM 61-120 MIN; LUMBAR SPINE - 2-3 VIEW COMPARISON:  CT lumbar spine 01/13/2015 FLUOROSCOPY TIME:  0 minutes 12 seconds Images obtained: 2 FINDINGS: Five lumbar vertebra labeled on prior  lumbar CT. Disc space narrowing L3-L4. BILATERAL pedicle screws have been placed at L3 and L4. Bones appear demineralized. Vertebral body heights maintained. IMPRESSION: Intraoperative images during L3-L4 posterior fusion as above. Electronically Signed   By: Lavonia Dana M.D.   On: 02/02/2015 15:39    Assessment/Plan: Diagnosis: Lumbar stenosis with bilateral radiculopathy Labs and images independently reviewed.  Records reviewed and summated above.  1. Does the need for close, 24 hr/day medical supervision in concert with the patient's rehab needs make it unreasonable for this patient to be served in a less intensive setting? Potentially  2. Co-Morbidities requiring supervision/potential complications: rheumatoid arthritis (cont meds, monitor for flares and treat pain to avoid hinderance with progress in therapies), hx of lumbar laminectomy L3-4, L4-5, L5-S1 decompression in 12/24/2013, post-op pain (Biofeedback training with therapies to help reduce reliance on opiate pain medications, monitor pain control during therapies, and sedation at rest and titrate to maximum efficacy to ensure participation and gains in therapies), DM (Monitor in  accordance with exercise and adjust meds as necessary), thrombocytopenia (< 60,000/mm3 no resistive exercise) 3. Due to safety, skin/wound care, disease management, pain management and patient education, does the patient require 24 hr/day rehab nursing? Potentially 4. Does the patient require coordinated care of a physician, rehab nurse, PT (1-2 hrs/day, 5 days/week) and OT (1-2 hrs/day, 5 days/week) to address physical and functional deficits in the context of the above medical diagnosis(es)? Potentially Addressing deficits in the following areas: endurance, locomotion, strength, transferring, dressing, toileting and psychosocial support 5. Can the patient actively participate in an intensive therapy program of at least 3 hrs of therapy per day at least 5 days per week? Yes 6. The potential for patient to make measurable gains while on inpatient rehab is good 7. Anticipated functional outcomes upon discharge from inpatient rehab are modified independent  with PT, modified independent with OT, n/a with SLP. 8. Estimated rehab length of stay to reach the above functional goals is: 8-11 days. 9. Does the patient have adequate social supports and living environment to accommodate these discharge functional goals? Yes 10. Anticipated D/C setting: Home 11. Anticipated post D/C treatments: HH therapy and Home excercise program 12. Overall Rehab/Functional Prognosis: excellent  RECOMMENDATIONS: This patient's condition is appropriate for continued rehabilitative care in the following setting: Pt ambulating 50ft min A on post-op day 1.  Pt is likely at or very close to her baseline level of functioning.  She will not likely require CIR services prior to discharge.  Further she does not have a medical need to just IRF.  Recommend home with [facility] and caregiver support when medically appropriate. Patient has agreed to participate in recommended program. Yes Note that insurance prior authorization may be required  for reimbursement for recommended care.  Comment: Rehab Admissions Coordinator to follow up  Delice Lesch, MD 02/03/2015

## 2015-02-03 NOTE — Progress Notes (Signed)
Physical medicine rehabilitation consult requested chart reviewed. Patient status post decompression fusion lumbar L3-4 evening of 02/02/2015. Await formal operative report as well as physical and occupational therapy evaluations and follow-up with appropriate recommendations at that time.

## 2015-02-03 NOTE — Op Note (Signed)
Courtney [name], Courtney [name] NO.:  [id number]  MEDICAL RECORD NO.:  [id number]  LOCATION:  5M08C                        FACILITY:  [facility]  PHYSICIAN:  Leeroy Cha, M.D.   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  Bilateral radiculopathy.  Status post decompression.  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  Bilateral radiculopathy.  Status post decompression.  PROCEDURE:  L3 removal of spinous process and lamina.  Bilateral facetectomy.  Lysis of adhesion to decompress the thecal and the L3 and L4 nerve root.  Attempt to open disk space.  Pedicle screws L3-L4. Posterolateral arthrodesis with BMP and autograft, Vitoss.  Microscope.  SURGEON:  Leeroy Cha, M.D.  ASSISTANT:  [name] Render. Pool, M.D.  CLINICAL HISTORY:  The patient in the past underwent lumbar laminectomy because of radiculopathy.  At this time, she is complaining of back pain _to both legs_________ associated with weakness.  Myelogram showed severe degenerative disk disease at the level L3-L4 with a large herniated disk compromising the thecal sac.  Surgery was advised.  She and her family knew the risk with the surgery including possibility of CSF leak, infection, need for further surgery, failure of the fusion.  DESCRIPTION OF PROCEDURE:  The patient was taken to the OR, and after intubation, she was positioned in a prone manner.  We had quite a bit of difficulty flexing her because of her rigidity.  At the end, we were able to clean the spine and drapes were applied.  A midline incision away from the previous one was made between L2 to L3-L4.  Muscle was retracted laterally.  X-rays were obtained and indeed the clip was  at the level of L3.  With retraction, we went all the way laterally until we were able to see and feel the transverse process of L3-4.  From then on, we removed the spinous process of L3 and the lamina.  The facet was quite a bit degenerative.  Removal was achieved.  What we found that the patient has posterolaterally a lot of adhesions.  We had to bring the microscope into the area just to be able to decompress the thecal sac away from the yellow ligament as well as the bone.  The yellow ligament was quite calcified.  There was a small opening which was taken care with a single stitch of 4-0 Nurolon.  Then, we retracted first on the left side and then the right side.  Lysis was accomplished.  We had good decompression of the L3-L4 nerve root.  We attempted to get into the disk space first in the right side and then to the left side, but the area was quite narrow and calcified that we were unable.  From then on, we localized the pedicle of L3 and L4 and we made holes.  Prior to insertion of the pedicle screws, we feel all 4 quadrants just to be sure that we were surrounded by  bone.  At the end, we were able to introduce 4 screws of 5.5 x 40.  The screws were kept in place with two rods, caps, and a crosslink from right to left.  Then, we went laterally and we removed the periosteum of the lateral facet of L3-4 as well as the transverse process of L3-4.  A mix of Vitoss, BMP and autograft was used for arthrodesis.  Valsalva maneuver was done 3 times up to 40 and was negative.  A drain was left in the operative site.  Vancomycin was left also in the area.  From then on, the wound was closed with Vicryl and staples.          ______________________________ Leeroy Cha, M.D.     EB/MEDQ  D:  02/02/2015  T:  02/02/2015  Job:  [id number]

## 2015-02-04 LAB — GLUCOSE, CAPILLARY
GLUCOSE-CAPILLARY: 185 mg/dL — AB (ref 65–99)
GLUCOSE-CAPILLARY: 200 mg/dL — AB (ref 65–99)
GLUCOSE-CAPILLARY: 241 mg/dL — AB (ref 65–99)
GLUCOSE-CAPILLARY: 321 mg/dL — AB (ref 65–99)
Glucose-Capillary: 231 mg/dL — ABNORMAL HIGH (ref 65–99)
Glucose-Capillary: 369 mg/dL — ABNORMAL HIGH (ref 65–99)

## 2015-02-04 MED ORDER — DEXAMETHASONE SODIUM PHOSPHATE 10 MG/ML IJ SOLN
INTRAMUSCULAR | Status: AC
  Filled 2015-02-04: qty 1

## 2015-02-04 MED ORDER — NAPROXEN 250 MG PO TABS
375.0000 mg | ORAL_TABLET | Freq: Three times a day (TID) | ORAL | Status: DC
  Administered 2015-02-04 – 2015-02-05 (×5): 375 mg via ORAL
  Filled 2015-02-04 (×7): qty 2

## 2015-02-04 MED ORDER — DEXAMETHASONE 4 MG PO TABS
6.0000 mg | ORAL_TABLET | Freq: Four times a day (QID) | ORAL | Status: DC
  Administered 2015-02-04 – 2015-02-07 (×13): 6 mg via ORAL
  Filled 2015-02-04 (×14): qty 1

## 2015-02-04 NOTE — Progress Notes (Signed)
Inpatient Rehabilitation  Met with patient and husband at bedside to discuss IP Rehab consult recommendations.  Patient does not meet medical necessity criteria for an IP Rehab admission.  Referred patient to RN CM for [facility] follow up.  Signing off at this time.    Carmelia Roller., CCC/SLP Admission Coordinator  [place]  Cell [phone]

## 2015-02-04 NOTE — Progress Notes (Signed)
Physical Therapy Treatment Patient Details Name: Courtney [name] MRN: [id number] DOB: Oct 13, 1940 Today's Date: 02/04/2015    History of Present Illness Pt is a 75 y/o female who presents s/p L3-L4 PLIF on 02/02/15.    PT Comments    Pt progressing slowly towards physical therapy goals. Pt with new onset RA flare up in L shoulder and pt had a difficult time with active movement in the L arm throughout functional mobility. Increased assist was provided for transfers. Discussed s/p plans with pt and husband, and feel this pt could benefit from continued rehab at the SNF level prior to d/c home (noted CIR not able to accept her). Pt and husband were both agreeable. Will continue to follow and progress as able per POC.   Follow Up Recommendations  Supervision for mobility/OOB;SNF     Equipment Recommendations  None recommended by PT    Recommendations for Other Services       Precautions / Restrictions Precautions Precautions: Fall;Back Precaution Comments: Pt was cued for back precautions during functional mobility Required Braces or Orthoses: Spinal Brace Spinal Brace: Lumbar corset;Applied in sitting position Restrictions Weight Bearing Restrictions: No    Mobility  Bed Mobility Overal bed mobility: Needs Assistance Bed Mobility: Rolling;Sidelying to Sit Rolling: Min guard Sidelying to sit: Min assist       General bed mobility comments: VC's for sequencing and technique. Assist required due to shoulder pain  Transfers Overall transfer level: Needs assistance Equipment used: Rolling walker (2 wheeled);1 person hand held assist Transfers: Sit to/from [facility] Sit to Stand: Mod assist;Min assist Stand pivot transfers: Mod assist;Min assist       General transfer comment: Initially SPT from bed to [facility] without RW. Mod assist for balance support. Pt then used walker to stand - max assist required for peri-care. Pt was able to use walker and take pivotal  steps around to the recliner with min assist.  Ambulation/Gait             General Gait Details: Unable to tolerate at this time.   Stairs            Wheelchair Mobility    Modified Rankin (Stroke Patients Only)       Balance Overall balance assessment: Needs assistance Sitting-balance support: Feet supported;No upper extremity supported Sitting balance-Leahy Scale: Fair     Standing balance support: Single extremity supported;During functional activity Standing balance-Leahy Scale: Poor                      Cognition Arousal/Alertness: Awake/alert Behavior During Therapy: WFL for tasks assessed/performed Overall Cognitive Status: Within Functional Limits for tasks assessed                      Exercises      General Comments        Pertinent Vitals/Pain Pain Assessment: Faces Faces Pain Scale: Hurts even more Pain Location: L shoulder Pain Descriptors / Indicators: Aching Pain Intervention(s): Limited activity within patient's tolerance;Monitored during session;Repositioned    Home Living                      Prior Function            PT Goals (current goals can now be found in the care plan section) Acute Rehab PT Goals Patient Stated Goal: Rehab before home PT Goal Formulation: With patient/family Time For Goal Achievement: 02/17/15 Potential to Achieve Goals: Good Progress  towards PT goals: Not progressing toward goals - comment (New onset RA flare up in L shoulder)    Frequency  Min 5X/week    PT Plan Discharge plan needs to be updated    Co-evaluation             End of Session Equipment Utilized During Treatment: Gait belt;Back brace Activity Tolerance: Patient limited by pain Patient left: in chair;with call bell/phone within reach;with chair alarm set;with family/visitor present     Time: [id number] PT Time Calculation (min) (ACUTE ONLY): 24 min  Charges:  $Therapeutic Activity: 23-37  mins                    G Codes:      Rolinda Roan 02-23-15, 12:46 PM   Rolinda Roan, PT, DPT Acute Rehabilitation Services Pager: [phone]

## 2015-02-04 NOTE — Progress Notes (Signed)
Patient ID: Courtney [name], female   DOB: 04/16/1940, 75 y.o.   MRN: [id number] No post op pain but only in her shoulder where she has artrithis  No leg pain

## 2015-02-04 NOTE — Progress Notes (Signed)
Inpatient Diabetes Program Recommendations  AACE/ADA: New Consensus Statement on Inpatient Glycemic Control (2015)  Target Ranges:  Prepandial:   less than 140 mg/dL      Peak postprandial:   less than 180 mg/dL (1-2 hours)      Critically ill patients:  140 - 180 mg/dL  Results for Courtney [name], Courtney [name] (MRN [id number]) as of 02/04/2015 09:30  Ref. Range 02/03/2015 07:50 02/03/2015 11:24 02/03/2015 18:24 02/03/2015 21:45 02/04/2015 06:26  Glucose-Capillary Latest Ref Range: 65-99 mg/dL 177 (H) 299 (H) 233 (H) 223 (H) 231 (H)   Review of Glycemic Control  Diabetes history: DM 2 Outpatient Diabetes medications: NPH 20-30 units BID/30-40 units BID, Regular insulin 5-15 units BID Current orders for Inpatient glycemic control: Novolog Moderate + HS scale  Note patient starting Decadron 4x/day.  Inpatient Diabetes Program Recommendations: Insulin - Basal: Patient takes NPH insulin at home. Patient's glucose increased to 299 mg/dl mid day. Please consider ordering a portion of patient's NPH dose, NPH 15 units BID.  Thanks,  Tama Headings RN, MSN, [facility] Inpatient Diabetes Coordinator Team Pager [phone] (8a-5p)

## 2015-02-05 DIAGNOSIS — N179 Acute kidney failure, unspecified: Secondary | ICD-10-CM | POA: Insufficient documentation

## 2015-02-05 LAB — GLUCOSE, CAPILLARY
GLUCOSE-CAPILLARY: 393 mg/dL — AB (ref 65–99)
GLUCOSE-CAPILLARY: 488 mg/dL — AB (ref 65–99)
Glucose-Capillary: 481 mg/dL — ABNORMAL HIGH (ref 65–99)
Glucose-Capillary: 402 mg/dL — ABNORMAL HIGH (ref 65–99)
Glucose-Capillary: 408 mg/dL — ABNORMAL HIGH (ref 65–99)

## 2015-02-05 LAB — BASIC METABOLIC PANEL
Anion gap: 13 (ref 5–15)
BUN: 33 mg/dL — AB (ref 6–20)
CHLORIDE: 93 mmol/L — AB (ref 101–111)
CO2: 20 mmol/L — AB (ref 22–32)
CREATININE: 1.31 mg/dL — AB (ref 0.44–1.00)
Calcium: 8.6 mg/dL — ABNORMAL LOW (ref 8.9–10.3)
GFR calc Af Amer: 45 mL/min — ABNORMAL LOW (ref >60)
GFR calc non Af Amer: 39 mL/min — ABNORMAL LOW (ref >60)
GLUCOSE: 498 mg/dL — AB (ref 65–99)
POTASSIUM: 4.5 mmol/L (ref 3.5–5.1)
Sodium: 126 mmol/L — ABNORMAL LOW (ref 135–145)

## 2015-02-05 LAB — GLUCOSE, RANDOM: Glucose, Bld: 534 mg/dL — ABNORMAL HIGH (ref 65–99)

## 2015-02-05 MED ORDER — INSULIN ASPART 100 UNIT/ML ~~LOC~~ SOLN
6.0000 [IU] | Freq: Three times a day (TID) | SUBCUTANEOUS | Status: DC
  Administered 2015-02-05 – 2015-02-06 (×2): 6 [IU] via SUBCUTANEOUS

## 2015-02-05 MED ORDER — INSULIN NPH (HUMAN) (ISOPHANE) 100 UNIT/ML ~~LOC~~ SUSP
30.0000 [IU] | Freq: Two times a day (BID) | SUBCUTANEOUS | Status: DC
  Filled 2015-02-05: qty 10

## 2015-02-05 MED ORDER — INSULIN ASPART 100 UNIT/ML ~~LOC~~ SOLN
8.0000 [IU] | Freq: Once | SUBCUTANEOUS | Status: AC | PRN
  Administered 2015-02-06: 8 [IU] via SUBCUTANEOUS

## 2015-02-05 MED ORDER — INSULIN NPH (HUMAN) (ISOPHANE) 100 UNIT/ML ~~LOC~~ SUSP
15.0000 [IU] | Freq: Every day | SUBCUTANEOUS | Status: DC
  Administered 2015-02-05: 15 [IU] via SUBCUTANEOUS
  Filled 2015-02-05: qty 10

## 2015-02-05 MED ORDER — INSULIN ASPART 100 UNIT/ML ~~LOC~~ SOLN
8.0000 [IU] | Freq: Once | SUBCUTANEOUS | Status: AC
  Administered 2015-02-05: 8 [IU] via SUBCUTANEOUS

## 2015-02-05 MED ORDER — INSULIN NPH (HUMAN) (ISOPHANE) 100 UNIT/ML ~~LOC~~ SUSP
15.0000 [IU] | Freq: Every day | SUBCUTANEOUS | Status: DC
  Filled 2015-02-05: qty 10

## 2015-02-05 MED ORDER — SODIUM CHLORIDE 0.9 % IV SOLN
INTRAVENOUS | Status: DC
  Administered 2015-02-05 – 2015-02-06 (×2): via INTRAVENOUS

## 2015-02-05 NOTE — Progress Notes (Signed)
Inpatient Diabetes Program Recommendations  AACE/ADA: New Consensus Statement on Inpatient Glycemic Control (2015)  Target Ranges:  Prepandial:   less than 140 mg/dL      Peak postprandial:   less than 180 mg/dL (1-2 hours)      Critically ill patients:  140 - 180 mg/dL   Review of Glycemic Control  Inpatient Diabetes Program Recommendations:  Insulin - Basal: add home dose NPH BID Correction (SSI): may need resistant scale during steroid therapy  Thank you  Raoul Pitch BSN, RN,CDE Inpatient Diabetes Coordinator [phone] (team pager)

## 2015-02-05 NOTE — Progress Notes (Signed)
Physical Therapy Treatment Patient Details Name: IOMA MENCER MRN: [id number] DOB: September 04, 1940 Today's Date: 02/05/2015    History of Present Illness Pt is a 75 y/o female who presents s/p L3-L4 PLIF on 02/02/15.    PT Comments    Pt progressing towards physical therapy goals. Noted improved posture and increased tolerance for functional activity this session. Pt reports decreased pain in L shoulder overall, however required seated rest break during gait training due to fatigue/pain in the L shoulder from RW use. Will continue to follow and progress as able per POC.   Follow Up Recommendations  SNF;Supervision for mobility/OOB     Equipment Recommendations  None recommended by PT    Recommendations for Other Services       Precautions / Restrictions Precautions Precautions: Fall;Back Precaution Booklet Issued: No Precaution Comments: Pt was cued for back precautions during functional mobility Required Braces or Orthoses: Spinal Brace Spinal Brace: Lumbar corset;Applied in sitting position Restrictions Weight Bearing Restrictions: No    Mobility  Bed Mobility Overal bed mobility: Needs Assistance Bed Mobility: Rolling;Sidelying to Sit Rolling: Supervision Sidelying to sit: Min guard       General bed mobility comments: [place]aby use of rails for support. No assist required from therapist.   Transfers Overall transfer level: Needs assistance Equipment used: Rolling walker (2 wheeled);1 person hand held assist Transfers: Sit to/from Stand Sit to Stand: Min assist         General transfer comment: Steadying assist as pt powered-up to full standing from EOB and later the [facility]. VC's for hand placement on seated surface for safety.   Ambulation/Gait Ambulation/Gait assistance: Min assist Ambulation Distance (Feet): 150 Feet (75', rest break, 75') Assistive device: Rolling walker (2 wheeled) Gait Pattern/deviations: Step-through pattern;Decreased stride length;Trunk  flexed Gait velocity: Decreased Gait velocity interpretation: Below normal speed for age/gender General Gait Details: Chair follow utilized. Pt required 1 seated rest break due to UE fatigue and pain (mainly L shoulder). Occasional assist for walker management required.    Stairs            Wheelchair Mobility    Modified Rankin (Stroke Patients Only)       Balance Overall balance assessment: Needs assistance Sitting-balance support: Feet supported;No upper extremity supported Sitting balance-Leahy Scale: Fair     Standing balance support: No upper extremity supported;During functional activity Standing balance-Leahy Scale: Poor Standing balance comment: Posterior LOB noted when donning second gown in standing.                     Cognition Arousal/Alertness: Awake/alert Behavior During Therapy: WFL for tasks assessed/performed Overall Cognitive Status: Within Functional Limits for tasks assessed                      Exercises      General Comments        Pertinent Vitals/Pain Pain Assessment: Faces Faces Pain Scale: Hurts a little bit Pain Location: Back Pain Descriptors / Indicators: Operative site guarding Pain Intervention(s): Limited activity within patient's tolerance;Monitored during session;Repositioned    Home Living                      Prior Function            PT Goals (current goals can now be found in the care plan section) Acute Rehab PT Goals Patient Stated Goal: Rehab before home PT Goal Formulation: With patient/family Time For Goal Achievement: 02/17/15 Potential to  Achieve Goals: Good Progress towards PT goals: Progressing toward goals    Frequency  Min 5X/week    PT Plan Current plan remains appropriate    Co-evaluation             End of Session Equipment Utilized During Treatment: Back brace Activity Tolerance: Patient tolerated treatment well Patient left: in chair;with call bell/phone  within reach;with chair alarm set;with family/visitor present     Time: 1002-1019 PT Time Calculation (min) (ACUTE ONLY): 17 min  Charges:  $Gait Training: 8-22 mins                    G Codes:      Rolinda Roan 2015/03/01, 10:44 AM   Rolinda Roan, PT, DPT Acute Rehabilitation Services Pager: [phone]

## 2015-02-05 NOTE — Progress Notes (Signed)
Subjective: Patient report   No pain  Objective: Vital signs in last 24 hours: Temp:  [97.7 F (36.5 C)-98.4 F (36.9 C)] 97.9 F (36.6 C) (01/27 1426) Pulse Rate:  [65-83] 74 (01/27 1426) Resp:  [18-20] 18 (01/27 1426) BP: (112-135)/(45-60) 117/50 mmHg (01/27 1426) SpO2:  [95 %-100 %] 100 % (01/27 1426)  Intake/Output from previous day: 01/26 0701 - 01/27 0700 In: 960 [P.O.:960] Out: -  Intake/Output this shift:   Wound dry. No weakness. Increase of glucose. hospitalist to help  Lab Results: No results for input(s): WBC, HGB, HCT, PLT in the last 72 hours. BMET  Recent Labs  02/05/15 1330  GLUCOSE 534*    Studies/Results: No results found.  Assessment/Plan: Pt/ot. hospitalist to help  LOS: 3 days  To f/u   Kent Riendeau M 02/05/2015, 3:57 PM

## 2015-02-05 NOTE — Consult Note (Signed)
[facility] Medical Consultation  Courtney [name] [id number] DOB: 01/07/1941 DOA: 02/02/2015 PCP: Rusty Aus., MD   Requesting physician: Leeroy Cha, MD Date of consultation: 02/05/15 Reason for consultation: Diabetes Management  Impression/Recommendations  Diabetes, presumed type 2. On insulin at home. Blood sugars elevated over last several weeks secondary to taking Prednisone. Glucose up to 564 this admission after starting Decadron. Was only getting SSI, Humulin N 30 units BID to start this evening.  -Will decrease intermediate insulin to 15 units BID given recent hypoglycemic events at home.  -Add a 3am CBG check while hospitalized -Give Novolog 6 units TID with meals and continue to supplement with moderate SSI.  -most recent hgb A1c 6.9 which is higher than her norm but patient had been taking steroids.   Hypoglycemia. In last 5 months patient has had several episodes of hypoglycemia between 12 AM and 2 AM. Blood sugars have been as low as 50. In the evening patient takes sliding scale regular insulin along with 30-40 units of intermediate acting insulin.  -Patient lost 50 pounds since April when she had shingles in mouth. Her insulin requirements have probably decreased. Will need to reduce evening intermediate insulin dose.   Rheumatoid arthritis. On Remicade and Arava.  We will followup again tomorrow. Please contact me if I can be of assistance in the meanwhile. Thank you for this consultation.  Chief Complaint: elevated blood sugars  HPI:  Patiet is a 75 year old female who is s/p decompression / lumbar fusion 3 days ago. We have been asked to help with diabetes management. Blood glucose have ranged between mid 200 to high 300s this admission but today up to 564.Decadron started yesterday.. HgbA1c 6.9 this month. She is currently getting Moderate SSI before meals and to start intermediate acting insulin this evening. Patient reports elevated blood sugars at home  over last several weeks secondary to steroids. Several times over the last several months she has woken up between 12-2am with blood sugars in the 50's. She varies NPH and Regular insulin dose based on blood sugars.    Patient lost 50 pounds over last several months. She had oral shingles in April. Her appetite is still not great. No other medical complaints   Review of Systems:  Constitutional: Denies fever, chills, diaphoresis, fatigue.  HEENT: Denies photophobia, eye pain, redness, hearing loss, ear pain, congestion, sore throat, rhinorrhea, sneezing, mouth sores, trouble swallowing, neck pain, neck stiffness and tinnitus.  Respiratory: Denies SOB, DOE, cough, chest tightness, and wheezing.  Cardiovascular: Denies chest pain, palpitations and leg swelling.  Gastrointestinal: Denies nausea, vomiting, abdominal pain, diarrhea, constipation, blood in stool and abdominal distention.  Genitourinary: Denies dysuria, urgency, frequency, hematuria, flank pain and difficulty urinating.  Musculoskeletal: Denies myalgias, back pain, joint swelling, arthralgias and gait problem.  Skin: Denies pallor, rash and wound.  Neurological: Denies dizziness, seizures, syncope, weakness, light-headedness, numbness and headaches.  Hematological: Denies adenopathy. Easy bruising, personal or family bleeding history  Psychiatric/Behavioral: Denies suicidal ideation, mood changes, confusion, nervousness, sleep disturbance and agitation    Past Medical History  Diagnosis Date  . Hypertension   . Arthritis     rheumatoid arthritis  . Diabetes mellitus without complication ([place])     type 2  . Varicose veins   . Sleep apnea 2004    does not use cpap  . GERD (gastroesophageal reflux disease)   . Headache     migraines in the past (none since menopause)  . Hives     "chronic"  .  Dermatitis   . Rosacea   . Eczema   . PONV (postoperative nausea and vomiting)   . Cancer ([place])     skin cancer (squamous cell)  .  Collagen vascular disease ([place])     RA   Past Surgical History  Procedure Laterality Date  . Eye surgery Bilateral 2006 and 2012    cataract surgery with lens implant  . Cholecystectomy  1963  . Tubal ligation  1968  . Endometrial ablation  1991  . Foot surgery Right     ligament and spurs  . Shoulder surgery Right     for a frozen shoulder  . Nasal septum surgery  2004  . Bilateral carpal tunnel release    . Skin cancer excision Right     leg x 4  . Eyelid surgery Bilateral 2014  . Lumbar laminectomy/decompression microdiscectomy Right 12/24/2013    Procedure: RIGHT LUMBAR THREE TO FOUR, LUMBAR FOUR TO FIVE, LUMBAR FIVE TO SACRAL ONE LAMINECTOMY/FORAMINOTOMY;  Surgeon: Floyce Stakes, MD;  Location: [place] [facility];  Service: Neurosurgery;  Laterality: Right;  RIGHT L3-4 L4-5 L5-S1 LAMINECTOMY/FORAMINOTOMY  . Breast cyst aspiration Left     neg   Social History:  reports that she has never smoked. She has never used smokeless tobacco. She reports that she does not drink alcohol or use illicit drugs.  Allergies  Allergen Reactions  . Methotrexate Derivatives Shortness Of Breath    Breathing difficulties   . Acyclovir And Related   . Codeine Nausea Only  . Contrast Media [Iodinated Diagnostic Agents] Itching and Rash  . Inderide [Propranolol-Hctz] Rash  . Lodine [Etodolac] Rash  . Procardia [Nifedipine] Rash   Family History  Problem Relation Age of Onset  . COPD Mother   . Arthritis/Rheumatoid Mother   . Alzheimer's disease Father   . Heart attack Father   . Breast cancer Paternal Aunt 17    Prior to Admission medications   Medication Sig Start Date End Date Taking? Authorizing Provider  aspirin EC 81 MG tablet Take 81 mg by mouth daily.   Yes Historical Provider, MD  bisoprolol-hydrochlorothiazide (ZIAC) 2.5-6.25 MG per tablet Take 2 tablets by mouth daily.   Yes Historical Provider, MD  doxazosin (CARDURA) 8 MG tablet Take 8 mg by mouth daily.    Yes Historical  Provider, MD  hydrOXYzine (ATARAX/VISTARIL) 25 MG tablet Take 25 mg by mouth 3 (three) times daily.   Yes Historical Provider, MD  insulin NPH Human (HUMULIN N,NOVOLIN N) 100 UNIT/ML injection Inject 20-30 Units into the skin 2 (two) times daily. 30-40 units twice per day   Yes Historical Provider, MD  insulin regular (NOVOLIN R,HUMULIN R) 100 units/mL injection Inject 5-15 Units into the skin 2 (two) times daily. 5-15 units twice per day   Yes Historical Provider, MD  leflunomide (ARAVA) 10 MG tablet Take 10 mg by mouth daily.   Yes Historical Provider, MD  levocetirizine (XYZAL) 5 MG tablet Take 5 mg by mouth every evening.   Yes Historical Provider, MD  lisinopril (PRINIVIL,ZESTRIL) 40 MG tablet Take 20 mg by mouth daily.    Yes Historical Provider, MD  omeprazole (PRILOSEC) 20 MG capsule Take 20 mg by mouth daily.   Yes Historical Provider, MD  ranitidine (ZANTAC) 150 MG tablet Take 150 mg by mouth 2 (two) times daily.   Yes Historical Provider, MD  simvastatin (ZOCOR) 40 MG tablet Take 40 mg by mouth daily.    Yes Historical Provider, MD  spironolactone (ALDACTONE) 25  MG tablet Take 25 mg by mouth daily.   Yes Historical Provider, MD  hydrochlorothiazide (HYDRODIURIL) 25 MG tablet Take 25 mg by mouth daily.    Historical Provider, MD  inFLIXimab (REMICADE) 100 MG injection Inject 500 mg into the vein every 6 (six) weeks.    Historical Provider, MD   Physical Exam: Blood pressure 117/50, pulse 74, temperature 97.9 F (36.6 C), temperature source Oral, resp. rate 18, height 5\' 8"  (1.727 m), weight 88.451 kg (195 lb), SpO2 100 %. Filed Vitals:   02/05/15 1023 02/05/15 1426  BP: 118/54 117/50  Pulse: 71 74  Temp: 97.7 F (36.5 C) 97.9 F (36.6 C)  Resp: 18 18    Constitutional: Vital signs reviewed. Patient is a well-developed and well-nourished in no acute distress and cooperative with exam. Alert and oriented x3.  Head: Normocephalic and atraumatic  Ear: normal hearing.  Mouth: no  erythema or exudates, MMM  Eyes: PER, conjunctivae normal, No scleral icterus.  Neck: Supple, Trachea midline normal ROM, No JVD or mass Cardiovascular: RRR, Soft murmur present.   Pulmonary/Chest: CTAB, no wheezes, rales, or rhonchi  Abdominal: Soft. Non-tender, non-distended, bowel sounds are normal, no masses, organomegaly, or guarding present.  GU: no CVA tenderness Musculoskeletal: No joint deformities, erythema, or stiffness, ROM full and no nontender Ext: no edema and no cyanosis, pulses palpable bilaterally (DP and PT)  Hematology: no cervical adenopathy.  Neurological: A&O x3, Strenght is normal and symmetric bilaterally, cranial nerve II-XII are grossly intact, no focal motor deficit, sensory intact to light touch bilaterally.  Skin: Warm, dry and intact. No rash, cyanosis, or clubbing.  Psychiatric: Normal mood and affect. speech and behavior is normal. Judgment and thought content normal. Cognition and memory are normal.   Labs on Admission:  Basic Metabolic Panel:  Recent Labs Lab 02/05/15 1330  GLUCOSE 534*   CBG:  Recent Labs Lab 02/04/15 1127 02/04/15 1604 02/04/15 2126 02/05/15 0628 02/05/15 1149  GLUCAP 241* 321* 369* 393* 481*    Time spent: 45 minutes  Tye Savoy [facility] Pager [phone]  If 7PM-7AM, please contact night-coverage [email or web address] Password [facility] 02/05/2015, 2:59 PM

## 2015-02-05 NOTE — Progress Notes (Signed)
Occupational Therapy Treatment Patient Details Name: Courtney [name] MRN: [id number] DOB: 25-Mar-1940 Today's Date: 02/05/2015    History of present illness Pt is a 75 y/o female who presents s/p L3-L4 PLIF on 02/02/15.   OT comments  Pt making great progress with OT.  Pt was overall S for most adls in room today including LE dressing, toileting and shower transfers.  Husband home at all times with pt. Reviewed back precautions and hand placement when using walker to ensure safety with transfers.  Feel pt may be able to d/c home instead of SNF since family available 24/7 and pt is min guard to supervision with most basic adls.  Rec HHOT  Follow Up Recommendations  Home health OT;Supervision/Assistance - 24 hour    Equipment Recommendations  None recommended by OT    Recommendations for Other Services      Precautions / Restrictions Precautions Precautions: Fall;Back Precaution Booklet Issued: No Precaution Comments: Cues given to reach back for chair when sitting Required Braces or Orthoses: Spinal Brace Spinal Brace: Lumbar corset;Applied in sitting position Restrictions Weight Bearing Restrictions: No       Mobility Bed Mobility Overal bed mobility: Needs Assistance Bed Mobility: Rolling;Sidelying to Sit Rolling: Supervision Sidelying to sit: Min guard       General bed mobility comments: Pt in chair on arrival.  Transfers Overall transfer level: Needs assistance Equipment used: Rolling walker (2 wheeled) Transfers: Sit to/from Stand Sit to Stand: Supervision         General transfer comment: Pt much steadier on feet today while mobilizing.  Pt does continue to require cues for hand placement when coming from sit to stand and stand to sit.    Balance Overall balance assessment: Needs assistance Sitting-balance support: Feet supported Sitting balance-Leahy Scale: Good     Standing balance support: Bilateral upper extremity supported;During functional  activity Standing balance-Leahy Scale: Fair Standing balance comment: Pt did let go of walker to clean self and to groom with no LOB.  Encouraged pt to keep walker close and use it since she has had LOB recently when dressing in hospital.                   ADL Overall ADL's : Needs assistance/impaired Eating/Feeding: Independent;Sitting   Grooming: Wash/dry hands;Wash/dry face;Oral care;Supervision/safety;Standing   Upper Body Bathing: Set up;Sitting       Upper Body Dressing : Set up;Sitting Upper Body Dressing Details (indicate cue type and reason): donned brace w/o assist. Lower Body Dressing: Supervision/safety;Sit to/from stand Lower Body Dressing Details (indicate cue type and reason): Pt can now cross one leg over the other to donn pants and shoes w/o physical assist.  NO AE need but husband there to assist if needed. Toilet Transfer: Supervision/safety;Comfort height toilet;Grab bars;RW;Ambulation Armed forces technical officer Details (indicate cue type and reason): Pt walked to bathroom to toilet. Cues given to reach back for the rails before sitting. Toileting- Clothing Manipulation and Hygiene: Supervision/safety;Sit to/from stand Toileting - Clothing Manipulation Details (indicate cue type and reason): No physical assist needed. Tub/ Shower Transfer: Walk-in shower;Min guard;Ambulation;Grab bars;Rolling walker Tub/Shower Transfer Details (indicate cue type and reason): Pt walked to bathroom and transferred into the shower with min guard just when stepping over the ledge of shower.   Functional mobility during ADLs: Supervision/safety;Rolling walker;Cueing for safety General ADL Comments: Family present for session. Pt doing very well today.  Spoke to family about this pt going straight home instead of to SNF.  She essentially needed no  hands on assist during OT session today.  Family agreed she may be ready.      Vision                     Perception     Praxis       Cognition   Behavior During Therapy: WFL for tasks assessed/performed Overall Cognitive Status: Within Functional Limits for tasks assessed                       Extremity/Trunk Assessment               Exercises     Shoulder Instructions       General Comments      Pertinent Vitals/ Pain       Pain Assessment: 0-10 Pain Score: 3  Faces Pain Scale: Hurts a little bit Pain Location: back Pain Descriptors / Indicators: Aching Pain Intervention(s): Monitored during session;Repositioned  Home Living                                          Prior Functioning/Environment              Frequency Min 2X/week     Progress Toward Goals  OT Goals(current goals can now be found in the care plan section)  Progress towards OT goals: Progressing toward goals  Acute Rehab OT Goals Patient Stated Goal: Pt ready to possibly dc straight home. OT Goal Formulation: With patient Time For Goal Achievement: 02/17/15 Potential to Achieve Goals: Good ADL Goals Pt Will Perform Grooming: with supervision;standing Pt Will Perform Lower Body Bathing: with supervision;with adaptive equipment;sit to/from stand Pt Will Perform Lower Body Dressing: with supervision;with adaptive equipment;sit to/from stand Pt Will Transfer to Toilet: with supervision;ambulating;bedside commode Pt Will Perform Toileting - Clothing Manipulation and hygiene: with supervision;sit to/from stand Additional ADL Goal #1: Pt will independently maintain back precautions during ADL activity.  Plan Discharge plan needs to be updated    Co-evaluation                 End of Session Equipment Utilized During Treatment: Rolling walker;Back brace   Activity Tolerance Patient tolerated treatment well   Patient Left in chair;with call bell/phone within reach;with family/visitor present   Nurse Communication Mobility status;Other (comment) (Pt may be safe enough to dc home since  she has 24/7 assist.)        Time: 1135-1200 OT Time Calculation (min): 25 min  Charges: OT General Charges $OT Visit: 1 Procedure OT Treatments $Self Care/Home Management : 23-37 mins  Glenford Peers 02/05/2015, 12:12 PM  [phone]

## 2015-02-06 DIAGNOSIS — N179 Acute kidney failure, unspecified: Secondary | ICD-10-CM

## 2015-02-06 LAB — GLUCOSE, CAPILLARY
GLUCOSE-CAPILLARY: 390 mg/dL — AB (ref 65–99)
GLUCOSE-CAPILLARY: 227 mg/dL — AB (ref 65–99)
Glucose-Capillary: 374 mg/dL — ABNORMAL HIGH (ref 65–99)
Glucose-Capillary: 376 mg/dL — ABNORMAL HIGH (ref 65–99)
Glucose-Capillary: 276 mg/dL — ABNORMAL HIGH (ref 65–99)
Glucose-Capillary: 273 mg/dL — ABNORMAL HIGH (ref 65–99)

## 2015-02-06 LAB — BASIC METABOLIC PANEL
Anion gap: 7 (ref 5–15)
BUN: 42 mg/dL — AB (ref 6–20)
CO2: 21 mmol/L — ABNORMAL LOW (ref 22–32)
CREATININE: 1.22 mg/dL — AB (ref 0.44–1.00)
Calcium: 8.5 mg/dL — ABNORMAL LOW (ref 8.9–10.3)
Chloride: 103 mmol/L (ref 101–111)
GFR calc Af Amer: 49 mL/min — ABNORMAL LOW (ref >60)
GFR, EST NON AFRICAN AMERICAN: 42 mL/min — AB (ref >60)
Glucose, Bld: 426 mg/dL — ABNORMAL HIGH (ref 65–99)
Potassium: 4.6 mmol/L (ref 3.5–5.1)
SODIUM: 131 mmol/L — AB (ref 135–145)

## 2015-02-06 MED ORDER — BISOPROLOL FUMARATE 10 MG PO TABS
10.0000 mg | ORAL_TABLET | Freq: Every day | ORAL | Status: DC
  Administered 2015-02-06: 10 mg via ORAL
  Filled 2015-02-06: qty 1

## 2015-02-06 MED ORDER — BISOPROLOL FUMARATE 10 MG PO TABS
10.0000 mg | ORAL_TABLET | Freq: Every day | ORAL | Status: DC
  Administered 2015-02-07: 10 mg via ORAL
  Filled 2015-02-06: qty 1

## 2015-02-06 MED ORDER — INSULIN NPH (HUMAN) (ISOPHANE) 100 UNIT/ML ~~LOC~~ SUSP
30.0000 [IU] | Freq: Every day | SUBCUTANEOUS | Status: DC
  Administered 2015-02-06: 30 [IU] via SUBCUTANEOUS
  Filled 2015-02-06 (×2): qty 10

## 2015-02-06 MED ORDER — INSULIN ASPART 100 UNIT/ML ~~LOC~~ SOLN
10.0000 [IU] | Freq: Three times a day (TID) | SUBCUTANEOUS | Status: DC
  Administered 2015-02-06 – 2015-02-07 (×4): 10 [IU] via SUBCUTANEOUS

## 2015-02-06 MED ORDER — INSULIN NPH (HUMAN) (ISOPHANE) 100 UNIT/ML ~~LOC~~ SUSP
20.0000 [IU] | Freq: Every day | SUBCUTANEOUS | Status: DC
  Filled 2015-02-06: qty 10

## 2015-02-06 MED ORDER — INSULIN NPH (HUMAN) (ISOPHANE) 100 UNIT/ML ~~LOC~~ SUSP
25.0000 [IU] | Freq: Every day | SUBCUTANEOUS | Status: DC
  Filled 2015-02-06: qty 10

## 2015-02-06 MED ORDER — INSULIN NPH (HUMAN) (ISOPHANE) 100 UNIT/ML ~~LOC~~ SUSP
40.0000 [IU] | Freq: Every day | SUBCUTANEOUS | Status: DC
  Filled 2015-02-06: qty 10

## 2015-02-06 MED ORDER — INSULIN NPH (HUMAN) (ISOPHANE) 100 UNIT/ML ~~LOC~~ SUSP
40.0000 [IU] | Freq: Every day | SUBCUTANEOUS | Status: DC
  Administered 2015-02-06 – 2015-02-07 (×2): 40 [IU] via SUBCUTANEOUS
  Filled 2015-02-06: qty 10

## 2015-02-06 NOTE — Progress Notes (Signed)
Physical Therapy Treatment Patient Details Name: Courtney [name] MRN: [id number] DOB: 10/09/1940 Today's Date: 02/06/2015    History of Present Illness Pt is a 75 y/o female who presents s/p L3-L4 PLIF on 02/02/15.    PT Comments    Pt progressing well towards physical therapy goals. Daughter present in room and reports that pt has not ambulated "this well in about 5 months". Pt reports no pain in L shoulder, and minimal pain in the low back/inicison site. Feel this pt is appropriate for return home with outpatient PT to follow when MD feels is appropriate per post-op protocol. Pt will need to practice stairs prior to return home (2 STE - no rails but with family to assist). Will continue to follow and progress as able per POC.   Follow Up Recommendations  Outpatient PT     Equipment Recommendations  None recommended by PT    Recommendations for Other Services       Precautions / Restrictions Precautions Precautions: Fall;Back Precaution Booklet Issued: No Precaution Comments: VC's for maintenance of back precautions during functional mobility.  Required Braces or Orthoses: Spinal Brace Spinal Brace: Lumbar corset;Applied in sitting position Restrictions Weight Bearing Restrictions: No    Mobility  Bed Mobility               General bed mobility comments: Pt sitting up in recliner upon PT arrival.   Transfers Overall transfer level: Needs assistance Equipment used: Rolling walker (2 wheeled) Transfers: Sit to/from Stand Sit to Stand: Supervision         General transfer comment: Pt demonstrated proper hand placement on seated surface for safety.   Ambulation/Gait Ambulation/Gait assistance: Min guard Ambulation Distance (Feet): 175 Feet Assistive device: Rolling walker (2 wheeled) Gait Pattern/deviations: Step-through pattern;Decreased stride length;Trunk flexed Gait velocity: Decreased Gait velocity interpretation: Below normal speed for age/gender General  Gait Details: VC's for improved posture. Pt did not require seated or standing rest break this session. Intermittent supervision provided but grossly pt requiring min guard assist for safety.    Stairs            Wheelchair Mobility    Modified Rankin (Stroke Patients Only)       Balance Overall balance assessment: Needs assistance Sitting-balance support: Feet supported;No upper extremity supported Sitting balance-Leahy Scale: Good     Standing balance support: No upper extremity supported;During functional activity Standing balance-Leahy Scale: Fair Standing balance comment: Standing statically at edge of chair prior to initiating stand>sit. Was adjusting brace and robe.                    Cognition Arousal/Alertness: Awake/alert Behavior During Therapy: WFL for tasks assessed/performed Overall Cognitive Status: Within Functional Limits for tasks assessed                      Exercises      General Comments        Pertinent Vitals/Pain Pain Assessment: Faces Faces Pain Scale: Hurts a little bit Pain Location: back Pain Descriptors / Indicators: Operative site guarding;Discomfort Pain Intervention(s): Limited activity within patient's tolerance;Monitored during session;Repositioned    Home Living                      Prior Function            PT Goals (current goals can now be found in the care plan section) Acute Rehab PT Goals Patient Stated Goal: Return home at d/c  PT Goal Formulation: With patient/family Time For Goal Achievement: 02/17/15 Potential to Achieve Goals: Good Progress towards PT goals: Progressing toward goals    Frequency  Min 5X/week    PT Plan Discharge plan needs to be updated    Co-evaluation             End of Session Equipment Utilized During Treatment: Back brace Activity Tolerance: Patient tolerated treatment well Patient left: in chair;with call bell/phone within reach;with chair alarm  set;with family/visitor present     Time: [id number] PT Time Calculation (min) (ACUTE ONLY): 16 min  Charges:  $Gait Training: 8-22 mins                    G Codes:      Rolinda Roan 2015/02/17, 10:34 AM   Rolinda Roan, PT, DPT Acute Rehabilitation Services Pager: [phone]

## 2015-02-06 NOTE — Progress Notes (Signed)
No acute events. Pain well controlled. AVSS Awake and alert Full strength BLE Stable Requests one more day before comfortable going home.  This is reasonable. Anticipate d/c tomorrow.

## 2015-02-06 NOTE — Progress Notes (Signed)
Patient ID: Courtney [name], female   DOB: Oct 03, 1940, 75 y.o.   MRN: [id number] Stopped by to see her. Glucose under better control. Continue as per hospitalits

## 2015-02-06 NOTE — Progress Notes (Signed)
[facility] follow-up consult note        PATIENT DETAILS Name: Courtney [name] Age: 75 y.o. Sex: female Date of Birth: 12-Oct-1940 Admit Date: 02/02/2015 Admitting Physician Leeroy Cha, MD [id number] F., MD  Subjective: Continues to have some back pain-but no complaints otherwise.  Assessment/Plan: Insulin-dependent type 2 diabetes: Patient apparently takes around 30-40 units of NPH twice a day at home, and 5-15 units of regular insulin with meals depending on CBGs. CBGs still very uncontrolled-will increase NPH to 40 units with breakfast, and 30 units at bedtime. Increase NovoLog to 10 units with meals. Follow CBGs.  Mild ARF: Likely mild prerenal azotemia in a setting of uncontrolled diabetes, lisinopril and diuretic use. Stop HCTZ, stop IV fluids-is euvolemic on exam. Follow.  Hypertension: Blood pressure soft-discontinue HCTZ, continue Cardura and bisoprolol.  We will continue to follow   Antimicrobial agents  See below  Anti-infectives    Start     Dose/Rate Route Frequency Ordered Stop   02/02/15 1900  ceFAZolin (ANCEF) IVPB 2 g/50 mL premix     2 g 100 mL/hr over 30 Minutes Intravenous Every 8 hours 02/02/15 1848 02/03/15 0430   02/02/15 1419  vancomycin (VANCOCIN) powder  Status:  Discontinued       As needed 02/02/15 1423 02/02/15 1439   02/02/15 1410  vancomycin (VANCOCIN) 1000 MG powder    Comments:  Atilano Median   : cabinet override      02/02/15 1410 02/03/15 0214   02/02/15 1015  ceFAZolin (ANCEF) IVPB 2 g/50 mL premix     2 g 100 mL/hr over 30 Minutes Intravenous To [facility] 02/01/15 1528 02/02/15 1117      Time spent 25 minutes-Greater than 50% of this time was spent in counseling, explanation of diagnosis, planning of further management, and coordination of care.  MEDICATIONS: Scheduled Meds: . aspirin EC  81 mg Oral Daily  . bisoprolol  10 mg Oral Daily  . dexamethasone  6 mg Oral 4 times per day  . doxazosin   8 mg Oral Daily  . hydrOXYzine  25 mg Oral TID  . insulin aspart  0-15 Units Subcutaneous TID WC  . insulin aspart  10 Units Subcutaneous TID WC  . insulin NPH Human  30 Units Subcutaneous QHS  . insulin NPH Human  40 Units Subcutaneous QAC breakfast  . leflunomide  10 mg Oral Daily  . loratadine  10 mg Oral QPM  . pantoprazole  40 mg Oral Daily  . senna  1 tablet Oral BID  . simvastatin  40 mg Oral q1800  . sodium chloride flush  3 mL Intravenous Q12H   Continuous Infusions: . sodium chloride    . lactated ringers 10 mL/hr at 02/02/15 0827   PRN Meds:.acetaminophen **OR** acetaminophen, diazepam, menthol-cetylpyridinium **OR** phenol, ondansetron (ZOFRAN) IV, oxyCODONE, sodium chloride flush    PHYSICAL EXAM: Vital signs in last 24 hours: Filed Vitals:   02/05/15 2051 02/06/15 0111 02/06/15 0700 02/06/15 0936  BP: 121/53 99/83 120/59 110/51  Pulse: 65 70 70 72  Temp: 97.7 F (36.5 C) 97.5 F (36.4 C) 97.9 F (36.6 C) 97.7 F (36.5 C)  TempSrc: Oral Oral Oral Oral  Resp:  18 19 20   Height:      Weight:      SpO2: 96% 99% 98% 100%    Weight change:  Filed Weights   02/02/15 0802  Weight: 88.451 kg (195 lb)   Body mass  index is 29.66 kg/(m^2).   Gen Exam: Awake and alert with clear speech.   Neck: Supple, No JVD.   Chest: B/L Clear.   CVS: S1 S2 Regular, no murmurs. Abdomen: soft, BS +, non tender, non distended.  Extremities: no edema, lower extremities warm to touch. Neurologic: Non Focal.   Skin: No Rash.   Wounds: N/A.    Intake/Output from previous day:  Intake/Output Summary (Last 24 hours) at 02/06/15 1104 Last data filed at 02/05/15 1443  Gross per 24 hour  Intake    240 ml  Output      0 ml  Net    240 ml     LAB RESULTS: CBC No results for input(s): WBC, HGB, HCT, PLT, MCV, MCH, MCHC, RDW, LYMPHSABS, MONOABS, EOSABS, BASOSABS, BANDABS in the last 168 hours.  Invalid input(s): NEUTRABS, BANDSABD  Chemistries   Recent Labs Lab  02/05/15 1330 02/05/15 1653 02/06/15 0342  NA  --  126* 131*  K  --  4.5 4.6  CL  --  93* 103  CO2  --  20* 21*  GLUCOSE 534* 498* 426*  BUN  --  33* 42*  CREATININE  --  1.31* 1.22*  CALCIUM  --  8.6* 8.5*    CBG:  Recent Labs Lab 02/05/15 1951 02/05/15 2242 02/06/15 0341 02/06/15 0536 02/06/15 0626  GLUCAP 402* 408* 390* 376* 374*    GFR Estimated Creatinine Clearance: 46.4 mL/min (by C-G formula based on Cr of 1.22).  Coagulation profile No results for input(s): INR, PROTIME in the last 168 hours.  Cardiac Enzymes No results for input(s): CKMB, TROPONINI, MYOGLOBIN in the last 168 hours.  Invalid input(s): CK  Invalid input(s): POCBNP No results for input(s): DDIMER in the last 72 hours. No results for input(s): HGBA1C in the last 72 hours. No results for input(s): CHOL, HDL, LDLCALC, TRIG, CHOLHDL, LDLDIRECT in the last 72 hours. No results for input(s): TSH, T4TOTAL, T3FREE, THYROIDAB in the last 72 hours.  Invalid input(s): FREET3 No results for input(s): VITAMINB12, FOLATE, FERRITIN, TIBC, IRON, RETICCTPCT in the last 72 hours. No results for input(s): LIPASE, AMYLASE in the last 72 hours.  Urine Studies No results for input(s): UHGB, CRYS in the last 72 hours.  Invalid input(s): UACOL, UAPR, USPG, UPH, UTP, UGL, UKET, UBIL, UNIT, UROB, ULEU, UEPI, UWBC, URBC, UBAC, CAST, UCOM, BILUA  MICROBIOLOGY: Recent Results (from the past 240 hour(s))  Surgical pcr screen     Status: Abnormal   Collection Time: 01/28/15  2:31 PM  Result Value Ref Range Status   MRSA, PCR POSITIVE (A) NEGATIVE Final    Comment: RESULT CALLED TO, READ BACK BY AND VERIFIED WITH: Bruna Potter RN 16:30 01/28/15 ([email or web address])    Staphylococcus aureus POSITIVE (A) NEGATIVE Final    Comment:        The Xpert SA Assay (FDA approved for NASAL specimens in patients over 27 years of age), is one component of a comprehensive surveillance program.  Test performance has been validated by  [facility] for patients greater than or equal to 32 year old. It is not intended to diagnose infection nor to guide or monitor treatment.     RADIOLOGY STUDIES/RESULTS: Dg Lumbar Spine 2-3 Views  02/02/2015  CLINICAL DATA:  L3-L4 PLIF EXAM: DG C-ARM 61-120 MIN; LUMBAR SPINE - 2-3 VIEW COMPARISON:  CT lumbar spine 01/13/2015 FLUOROSCOPY TIME:  0 minutes 12 seconds Images obtained: 2 FINDINGS: Five lumbar vertebra labeled on prior lumbar CT. Disc space narrowing  L3-L4. BILATERAL pedicle screws have been placed at L3 and L4. Bones appear demineralized. Vertebral body heights maintained. IMPRESSION: Intraoperative images during L3-L4 posterior fusion as above. Electronically Signed   By: Lavonia Dana M.D.   On: 02/02/2015 15:39   Dg Lumbar Spine 2-3 Views  02/02/2015  CLINICAL DATA:  L3-4 PLIF EXAM: LUMBAR SPINE - 2-3 VIEW COMPARISON:  01/13/2015 FINDINGS: Three intraoperative views of the lumbar spine were obtained in the lateral projection. The initial images demonstrate surgical instruments posterior to the L3-4 interspace and L4 vertebral body. Subsequently surgical retractors are noted in place with surgical instrument extending into the spinal canal at the L3-4 level. The numbering nomenclature is similar to that utilized on the prior CT examination. IMPRESSION: Intraoperative localization at L3-4. Electronically Signed   By: Inez Catalina M.D.   On: 02/02/2015 12:15   Ct Lumbar Spine W Contrast  01/13/2015  CLINICAL DATA:  Severe low back and LEFT leg pain. EXAM: LUMBAR MYELOGRAM FLUOROSCOPY TIME:  dictate in minutes and seconds PROCEDURE: After thorough discussion of risks and benefits of the procedure including bleeding, infection, injury to nerves, blood vessels, adjacent structures as well as headache and CSF leak, written and oral informed consent was obtained. Consent was obtained by Dr. Rolla Flatten. Time out form was completed. Patient was positioned prone on the fluoroscopy table. Local  anesthesia was provided with 1% lidocaine without epinephrine after prepped and draped in the usual sterile fashion. Puncture was performed at L4 using a 3 1/2 inch 22-gauge spinal needle via midline approach. Using a single pass through the dura, the needle was placed within the thecal sac, with return of clear CSF. 15 mL of Isovue M 200 was injected into the thecal sac, with normal opacification of the nerve roots and cauda equina consistent with free flow within the subarachnoid space. I personally performed the lumbar puncture and administered the intrathecal contrast. I also personally supervised acquisition of the myelogram images. TECHNIQUE: Contiguous axial images were obtained through the Lumbar spine after the intrathecal infusion of infusion. Coronal and sagittal reconstructions were obtained of the axial image sets. COMPARISON:  Mixed injection myelogram 12/25/2014. MRI lumbar spine 09/26/2014. FINDINGS: LUMBAR MYELOGRAM FINDINGS: Good opacification lumbar subarachnoid space. 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 Severe disc space narrowing at L3-4 with vacuum phenomenon. Endplate reactive changes with diffuse loss of interspace height. Marked extraforaminal spurring extending to the LEFT greater than RIGHT. Fairly well preserved interspace at L2-3, with marked osseous spurring to the RIGHT. Mild disc space narrowing at L4-5 with a moderate-sized ventral defect representing soft disc protrusion. 2 mm anterolisthesis with patient prone. LEFT L5 nerve root impingement is noted. No disc space narrowing at L5-S1 or significant extradural defect. Anatomic alignment otherwise. With the patient upright, there is increasingly severe loss of interspace height at L3-4 to the LEFT, resulting in a mild degenerative scoliosis convex RIGHT of 14 degrees. Severe LEFT greater than RIGHT L4 nerve root impingement. LEFT L5 nerve root impingement does not significantly  worsen with patient standing. In addition, with the patient upright, significant retrolisthesis develops at L3-4. This measures 5 mm in neutral, 6 mm in flexion, and 5 mm in extension. No significant instability at L4-5. CT LUMBAR MYELOGRAM FINDINGS: No prevertebral or paraspinous masses. No worrisome osseous lesions. Atherosclerosis without aneurysmal dilatation. No renal masses. Osteopenia. L1-L2:  Anterior osseous spurring.  No impingement. L2-L3: Marked extraforaminal osseous spurring greater on the RIGHT. Mild  annular bulging centrally. No impingement. L3-L4: Severe endplate degenerative change and vacuum phenomenon, but no anterolisthesis with patient recumbent. Small RIGHT laminotomy. Posterior element hypertrophy. Recurrent central disc extrusion, likely free fragment in the canal results in near complete block. BILATERAL L4 nerve root impingement due to compression of the thecal sac and subarticular zone narrowing. BILATERAL disc material extends to foramina with compression of the L3 nerve roots. L4-L5: 2 mm anterolisthesis. RIGHT laminotomy. Advanced facet arthropathy with central protrusion and LEFT greater than RIGHT facet arthropathy. LEFT subarticular zone narrowing effaces the L5 nerve root. Disc material extends into both foramina ; either L4 nerve root could be affected. L5-S1: Shallow central protrusion. RIGHT laminotomy. Mild facet arthropathy. No impingement. Compared with the preoperative MRI from September 2015, the stenosis at L3-4 appears more severe. The stenosis at L4-5 and L5-S1 slightly improved on the RIGHT, but equally severe on the LEFT. 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 Dynamic instability at L3-4 with patient standing. 14 degrees of degenerative scoliosis convex RIGHT develops, along with up to 6 mm of retrolisthesis L3 on L4 in flexion. 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 This is noted even though there is no subluxation with patient recumbent for CT. BILATERAL subarticular zone and foraminal zone narrowing is present. RIGHT L4 and L5 laminotomies. Asymmetric subarticular zone narrowing at L4-5 on the LEFT relates primarily to facet disease and central protrusion. Electronically Signed   By: Staci Righter M.D.   On: 01/13/2015 16:05   Dg C-arm 1-60 Min  02/02/2015  CLINICAL DATA:  L3-L4 PLIF EXAM: DG C-ARM 61-120 MIN; LUMBAR SPINE - 2-3 VIEW COMPARISON:  CT lumbar spine 01/13/2015 FLUOROSCOPY TIME:  0 minutes 12 seconds Images obtained: 2 FINDINGS: Five lumbar vertebra labeled on prior lumbar CT. Disc space narrowing L3-L4. BILATERAL pedicle screws have been placed at L3 and L4. Bones appear demineralized. Vertebral body heights maintained. IMPRESSION: Intraoperative images during L3-L4 posterior fusion as above. Electronically Signed   By: Lavonia Dana M.D.   On: 02/02/2015 15:39   Dg Myelography Lumbar Inj Lumbosacral  01/13/2015  CLINICAL DATA:  Severe low back and LEFT leg pain. EXAM: LUMBAR MYELOGRAM FLUOROSCOPY TIME:  dictate in minutes and seconds PROCEDURE: After thorough discussion of risks and benefits of the procedure including bleeding, infection, injury to nerves, blood vessels, adjacent structures as well as headache and CSF leak, written and oral informed consent was obtained. Consent was obtained by Dr. Rolla Flatten. Time out form was completed. Patient was positioned prone on the fluoroscopy table. Local anesthesia was provided with 1% lidocaine without epinephrine after prepped and draped in the usual sterile fashion. Puncture was performed at L4 using a 3 1/2 inch 22-gauge spinal needle via midline approach. Using a single pass through the dura, the needle was placed within the thecal sac, with return of clear CSF. 15 mL of Isovue M 200 was injected into the thecal sac, with  normal opacification of the nerve roots and cauda equina consistent with free flow within the subarachnoid space. I personally performed the lumbar puncture and administered the intrathecal contrast. I also personally supervised acquisition of the myelogram images. TECHNIQUE: Contiguous axial images were obtained through the Lumbar spine after the intrathecal infusion of infusion. Coronal and sagittal reconstructions were obtained of the axial image sets. COMPARISON:  Mixed injection myelogram 12/25/2014. MRI lumbar spine 09/26/2014. FINDINGS:  LUMBAR MYELOGRAM FINDINGS: Good opacification lumbar subarachnoid space. 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 Severe disc space narrowing at L3-4 with vacuum phenomenon. Endplate reactive changes with diffuse loss of interspace height. Marked extraforaminal spurring extending to the LEFT greater than RIGHT. Fairly well preserved interspace at L2-3, with marked osseous spurring to the RIGHT. Mild disc space narrowing at L4-5 with a moderate-sized ventral defect representing soft disc protrusion. 2 mm anterolisthesis with patient prone. LEFT L5 nerve root impingement is noted. No disc space narrowing at L5-S1 or significant extradural defect. Anatomic alignment otherwise. With the patient upright, there is increasingly severe loss of interspace height at L3-4 to the LEFT, resulting in a mild degenerative scoliosis convex RIGHT of 14 degrees. Severe LEFT greater than RIGHT L4 nerve root impingement. LEFT L5 nerve root impingement does not significantly worsen with patient standing. In addition, with the patient upright, significant retrolisthesis develops at L3-4. This measures 5 mm in neutral, 6 mm in flexion, and 5 mm in extension. No significant instability at L4-5. CT LUMBAR MYELOGRAM FINDINGS: No prevertebral or paraspinous masses. No worrisome osseous lesions. Atherosclerosis without aneurysmal dilatation. No renal  masses. Osteopenia. L1-L2:  Anterior osseous spurring.  No impingement. L2-L3: Marked extraforaminal osseous spurring greater on the RIGHT. Mild annular bulging centrally. No impingement. L3-L4: Severe endplate degenerative change and vacuum phenomenon, but no anterolisthesis with patient recumbent. Small RIGHT laminotomy. Posterior element hypertrophy. Recurrent central disc extrusion, likely free fragment in the canal results in near complete block. BILATERAL L4 nerve root impingement due to compression of the thecal sac and subarticular zone narrowing. BILATERAL disc material extends to foramina with compression of the L3 nerve roots. L4-L5: 2 mm anterolisthesis. RIGHT laminotomy. Advanced facet arthropathy with central protrusion and LEFT greater than RIGHT facet arthropathy. LEFT subarticular zone narrowing effaces the L5 nerve root. Disc material extends into both foramina ; either L4 nerve root could be affected. L5-S1: Shallow central protrusion. RIGHT laminotomy. Mild facet arthropathy. No impingement. Compared with the preoperative MRI from September 2015, the stenosis at L3-4 appears more severe. The stenosis at L4-5 and L5-S1 slightly improved on the RIGHT, but equally severe on the LEFT. 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 Dynamic instability at L3-4 with patient standing. 14 degrees of degenerative scoliosis convex RIGHT develops, along with up to 6 mm of retrolisthesis L3 on L4 in flexion. 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 This is noted even though there is no subluxation with patient recumbent for CT. BILATERAL subarticular zone and foraminal zone narrowing is present. RIGHT L4 and L5 laminotomies. Asymmetric subarticular zone narrowing at L4-5 on the LEFT relates primarily to facet disease and central protrusion.  Electronically Signed   By: Staci Righter M.D.   On: 01/13/2015 16:05    Oren Binet, MD  [facility] Pager:[phone]  If 7PM-7AM, please contact night-coverage [email or web address] Password [email or web address] 02/06/2015, 11:04 AM   LOS: 4 days

## 2015-02-07 DIAGNOSIS — I10 Essential (primary) hypertension: Secondary | ICD-10-CM

## 2015-02-07 LAB — BASIC METABOLIC PANEL
Anion gap: 4 — ABNORMAL LOW (ref 5–15)
BUN: 41 mg/dL — AB (ref 6–20)
CO2: 24 mmol/L (ref 22–32)
Calcium: 8.3 mg/dL — ABNORMAL LOW (ref 8.9–10.3)
Chloride: 107 mmol/L (ref 101–111)
Creatinine, Ser: 0.98 mg/dL (ref 0.44–1.00)
GFR, EST NON AFRICAN AMERICAN: 55 mL/min — AB (ref >60)
Glucose, Bld: 220 mg/dL — ABNORMAL HIGH (ref 65–99)
POTASSIUM: 4.6 mmol/L (ref 3.5–5.1)
SODIUM: 135 mmol/L (ref 135–145)

## 2015-02-07 LAB — GLUCOSE, CAPILLARY
GLUCOSE-CAPILLARY: 194 mg/dL — AB (ref 65–99)
GLUCOSE-CAPILLARY: 187 mg/dL — AB (ref 65–99)
GLUCOSE-CAPILLARY: 191 mg/dL — AB (ref 65–99)

## 2015-02-07 MED ORDER — DIAZEPAM 5 MG PO TABS: 5.0000 mg | ORAL_TABLET | Freq: Four times a day (QID) | ORAL | Status: DC | PRN

## 2015-02-07 MED ORDER — INSULIN NPH (HUMAN) (ISOPHANE) 100 UNIT/ML ~~LOC~~ SUSP: 30.0000 [IU] | Freq: Every day | SUBCUTANEOUS | Status: DC

## 2015-02-07 MED ORDER — METHYLPREDNISOLONE 4 MG PO TABS: ORAL_TABLET | ORAL | Status: DC

## 2015-02-07 MED ORDER — BISOPROLOL FUMARATE 10 MG PO TABS: 10.0000 mg | ORAL_TABLET | Freq: Every day | ORAL | Status: AC

## 2015-02-07 MED ORDER — INSULIN NPH (HUMAN) (ISOPHANE) 100 UNIT/ML ~~LOC~~ SUSP: 40.0000 [IU] | Freq: Every day | SUBCUTANEOUS | Status: AC

## 2015-02-07 MED ORDER — OXYCODONE HCL 10 MG PO TABS: 10.0000 mg | ORAL_TABLET | ORAL | Status: DC | PRN

## 2015-02-07 NOTE — Progress Notes (Signed)
Physical Therapy Treatment Patient Details Name: Courtney [name] MRN: [id number] DOB: September 11, 1940 Today's Date: 02/07/2015    History of Present Illness Pt is a 75 y/o female who presents s/p L3-L4 PLIF on 02/02/15.    PT Comments    Noting continuing progress with functional mobility; OK for dc home from PT standpoint   Follow Up Recommendations  Outpatient PT The potential need for Outpatient PT can be assessed at Neurosurgery follow-up appointments.      Equipment Recommendations  None recommended by PT    Recommendations for Other Services       Precautions / Restrictions Precautions Precautions: Fall;Back Precaution Comments: Patient able to recall precautions and maintains precautions with functional mobility.   Required Braces or Orthoses: Spinal Brace Spinal Brace: Lumbar corset;Applied in sitting position    Mobility  Bed Mobility                  Transfers Overall transfer level: Needs assistance Equipment used: Rolling walker (2 wheeled) Transfers: Sit to/from Stand Sit to Stand: Supervision         General transfer comment: Pt demonstrated proper hand placement on seated surface for safety.   Ambulation/Gait Ambulation/Gait assistance: Min guard;Supervision Ambulation Distance (Feet): 150 Feet Assistive device: Rolling walker (2 wheeled) Gait Pattern/deviations: Step-through pattern Gait velocity: Decreased   General Gait Details: minguard assist progressing to Supervision; Cues to self-monitor for activitytolerance   Stairs Stairs: Yes Stairs assistance: Min assist Stair Management: One rail Left;Forwards;Step to pattern (simulated use of handle to be placed on L) Number of Stairs: 2 General stair comments: Cues for gait sequence and hand hold positioning for support  Wheelchair Mobility    Modified Rankin (Stroke Patients Only)       Balance     Sitting balance-Leahy Scale: Good Sitting balance - Comments: Noted very good  scoot to Edge of chair     Standing balance-Leahy Scale: Fair                      Cognition Arousal/Alertness: Awake/alert Behavior During Therapy: WFL for tasks assessed/performed Overall Cognitive Status: Within Functional Limits for tasks assessed                      Exercises      General Comments        Pertinent Vitals/Pain Pain Assessment: 0-10 Pain Score: 4  Pain Location: tenderness at back Pain Descriptors / Indicators: Discomfort;Sore Pain Intervention(s): Monitored during session    Home Living                      Prior Function            PT Goals (current goals can now be found in the care plan section) Acute Rehab PT Goals Patient Stated Goal: Return home at d/c PT Goal Formulation: With patient/family Time For Goal Achievement: 02/17/15 Potential to Achieve Goals: Good Progress towards PT goals: Progressing toward goals    Frequency  Min 5X/week    PT Plan Current plan remains appropriate    Co-evaluation             End of Session Equipment Utilized During Treatment: Back brace Activity Tolerance: Patient tolerated treatment well Patient left: in chair;with call bell/phone within reach;with family/visitor present     Time: [id number] PT Time Calculation (min) (ACUTE ONLY): 18 min  Charges:  $Gait Training: 8-22 mins  G Codes:      Roney Marion [facility] 02/07/2015, 8:53 AM  Roney Marion, PT  Acute Rehabilitation Services Pager [phone] Office [phone]

## 2015-02-07 NOTE — Progress Notes (Signed)
Discharge orders received. Pt and family educated on discharge instructions and spinal precautions. Pt verbalized understanding. Pt given discharge packet and prescription. IV removed. Pt dressed self and packed belongings. Pt taken downstairs by RN via wheelchair.

## 2015-02-07 NOTE — Progress Notes (Signed)
No acute events Doing well Full strength Incision c/d/i Ready for d/c

## 2015-02-07 NOTE — Progress Notes (Signed)
[facility] follow-up consult note        PATIENT DETAILS Name: Courtney [name] Age: 75 y.o. Sex: female Date of Birth: 1940-08-22 Admit Date: 02/02/2015 Admitting Physician Leeroy Cha, MD [id number] F., MD  Subjective: CBG's much better  Assessment/Plan: Insulin-dependent type 2 diabetes: CBG's much better with- NPH to 40 units with breakfast, and 30 units at bedtime and SSI. Suspect that her requirement will come down as she is titrated off Steroids.  Would discharge her on: NPH to 40 units with breakfast, and 30 units at bedtime Her usual SSI regimen Have her follow up with PCP in 1 week for further optimization Patient is very familiar with Insulin regimen-has scale for both SSI and NPH-has been on Insulin since 1997  Hypertension: controlled  Would discharge her on: Bisoprolol 10 mg daily Cardura 8 mg daily Have her follow up with PCP in 1 week for further optimization Please discontinue HCTZ, Aldactone and Lisinopri for now given mild ARF. She is euvolemic on exam.  Mild ARF: Resolved.Likely mild prerenal azotemia in a setting of uncontrolled diabetes, lisinopril and diuretic use. Stop HCTZ,Lisinopril and Aldactone for now. Have her follow up with PCP  Hospitalist service will sign off-call with questions. Ok to discharge today from my point of view.   Antimicrobial agents  See below  Anti-infectives    Start     Dose/Rate Route Frequency Ordered Stop   02/02/15 1900  ceFAZolin (ANCEF) IVPB 2 g/50 mL premix     2 g 100 mL/hr over 30 Minutes Intravenous Every 8 hours 02/02/15 1848 02/03/15 0430   02/02/15 1419  vancomycin (VANCOCIN) powder  Status:  Discontinued       As needed 02/02/15 1423 02/02/15 1439   02/02/15 1410  vancomycin (VANCOCIN) 1000 MG powder    Comments:  Atilano Median   : cabinet override      02/02/15 1410 02/03/15 0214   02/02/15 1015  ceFAZolin (ANCEF) IVPB 2 g/50 mL premix     2 g 100 mL/hr over 30 Minutes  Intravenous To ShortStay Surgical 02/01/15 1528 02/02/15 1117      Time spent 25 minutes-Greater than 50% of this time was spent in counseling, explanation of diagnosis, planning of further management, and coordination of care.  MEDICATIONS: Scheduled Meds: . aspirin EC  81 mg Oral Daily  . bisoprolol  10 mg Oral Daily  . dexamethasone  6 mg Oral 4 times per day  . doxazosin  8 mg Oral Daily  . hydrOXYzine  25 mg Oral TID  . insulin aspart  0-15 Units Subcutaneous TID WC  . insulin aspart  10 Units Subcutaneous TID WC  . insulin NPH Human  30 Units Subcutaneous QHS  . insulin NPH Human  40 Units Subcutaneous QAC breakfast  . leflunomide  10 mg Oral Daily  . loratadine  10 mg Oral QPM  . pantoprazole  40 mg Oral Daily  . senna  1 tablet Oral BID  . simvastatin  40 mg Oral q1800  . sodium chloride flush  3 mL Intravenous Q12H   Continuous Infusions: . sodium chloride    . lactated ringers 10 mL/hr at 02/02/15 0827   PRN Meds:.acetaminophen **OR** acetaminophen, diazepam, menthol-cetylpyridinium **OR** phenol, ondansetron (ZOFRAN) IV, oxyCODONE, sodium chloride flush    PHYSICAL EXAM: Vital signs in last 24 hours: Filed Vitals:   02/06/15 1503 02/06/15 1645 02/07/15 0142 02/07/15 0535  BP: 128/54 129/81 124/56 124/64  Pulse: 68  67 61 60  Temp: 98.1 F (36.7 C) 98.1 F (36.7 C) 97.5 F (36.4 C) 97.5 F (36.4 C)  TempSrc: Oral Oral Oral Oral  Resp: 20 20 18 18   Height:      Weight:      SpO2: 99% 99% 96% 98%    Weight change:  Filed Weights   02/02/15 0802  Weight: 88.451 kg (195 lb)   Body mass index is 29.66 kg/(m^2).   Gen Exam: Awake and alert with clear speech.   Neck: Supple, No JVD.   Chest: B/L Clear.   CVS: S1 S2 Regular, no murmurs. Abdomen: soft, BS +, non tender, non distended.  Extremities: no edema, lower extremities warm to touch. Neurologic: Non Focal.   Skin: No Rash.   Wounds: N/A.    Intake/Output from previous day:  Intake/Output  Summary (Last 24 hours) at 02/07/15 0926 Last data filed at 02/06/15 1805  Gross per 24 hour  Intake    240 ml  Output      0 ml  Net    240 ml     LAB RESULTS: CBC No results for input(s): WBC, HGB, HCT, PLT, MCV, MCH, MCHC, RDW, LYMPHSABS, MONOABS, EOSABS, BASOSABS, BANDABS in the last 168 hours.  Invalid input(s): NEUTRABS, BANDSABD  Chemistries   Recent Labs Lab 02/05/15 1330 02/05/15 1653 02/06/15 0342 02/07/15 0430  NA  --  126* 131* 135  K  --  4.5 4.6 4.6  CL  --  93* 103 107  CO2  --  20* 21* 24  GLUCOSE 534* 498* 426* 220*  BUN  --  33* 42* 41*  CREATININE  --  1.31* 1.22* 0.98  CALCIUM  --  8.6* 8.5* 8.3*    CBG:  Recent Labs Lab 02/06/15 1154 02/06/15 1637 02/06/15 2216 02/07/15 0302 02/07/15 0619  GLUCAP 276* 273* 227* 194* 187*    GFR Estimated Creatinine Clearance: 57.7 mL/min (by C-G formula based on Cr of 0.98).  Coagulation profile No results for input(s): INR, PROTIME in the last 168 hours.  Cardiac Enzymes No results for input(s): CKMB, TROPONINI, MYOGLOBIN in the last 168 hours.  Invalid input(s): CK  Invalid input(s): POCBNP No results for input(s): DDIMER in the last 72 hours. No results for input(s): HGBA1C in the last 72 hours. No results for input(s): CHOL, HDL, LDLCALC, TRIG, CHOLHDL, LDLDIRECT in the last 72 hours. No results for input(s): TSH, T4TOTAL, T3FREE, THYROIDAB in the last 72 hours.  Invalid input(s): FREET3 No results for input(s): VITAMINB12, FOLATE, FERRITIN, TIBC, IRON, RETICCTPCT in the last 72 hours. No results for input(s): LIPASE, AMYLASE in the last 72 hours.  Urine Studies No results for input(s): UHGB, CRYS in the last 72 hours.  Invalid input(s): UACOL, UAPR, USPG, UPH, UTP, UGL, UKET, UBIL, UNIT, UROB, ULEU, UEPI, UWBC, URBC, UBAC, CAST, UCOM, BILUA  MICROBIOLOGY: Recent Results (from the past 240 hour(s))  Surgical pcr screen     Status: Abnormal   Collection Time: 01/28/15  2:31 PM  Result  Value Ref Range Status   MRSA, PCR POSITIVE (A) NEGATIVE Final    Comment: RESULT CALLED TO, READ BACK BY AND VERIFIED WITH: Bruna Potter RN 16:30 01/28/15 ([email or web address])    Staphylococcus aureus POSITIVE (A) NEGATIVE Final    Comment:        The Xpert SA Assay (FDA approved for NASAL specimens in patients over 77 years of age), is one component of a comprehensive surveillance program.  Test performance has been validated  by [facility] for patients greater than or equal to 12 year old. It is not intended to diagnose infection nor to guide or monitor treatment.     RADIOLOGY STUDIES/RESULTS: Dg Lumbar Spine 2-3 Views  02/02/2015  CLINICAL DATA:  L3-L4 PLIF EXAM: DG C-ARM 61-120 MIN; LUMBAR SPINE - 2-3 VIEW COMPARISON:  CT lumbar spine 01/13/2015 FLUOROSCOPY TIME:  0 minutes 12 seconds Images obtained: 2 FINDINGS: Five lumbar vertebra labeled on prior lumbar CT. Disc space narrowing L3-L4. BILATERAL pedicle screws have been placed at L3 and L4. Bones appear demineralized. Vertebral body heights maintained. IMPRESSION: Intraoperative images during L3-L4 posterior fusion as above. Electronically Signed   By: Lavonia Dana M.D.   On: 02/02/2015 15:39   Dg Lumbar Spine 2-3 Views  02/02/2015  CLINICAL DATA:  L3-4 PLIF EXAM: LUMBAR SPINE - 2-3 VIEW COMPARISON:  01/13/2015 FINDINGS: Three intraoperative views of the lumbar spine were obtained in the lateral projection. The initial images demonstrate surgical instruments posterior to the L3-4 interspace and L4 vertebral body. Subsequently surgical retractors are noted in place with surgical instrument extending into the spinal canal at the L3-4 level. The numbering nomenclature is similar to that utilized on the prior CT examination. IMPRESSION: Intraoperative localization at L3-4. Electronically Signed   By: Inez Catalina M.D.   On: 02/02/2015 12:15   Ct Lumbar Spine W Contrast  01/13/2015  CLINICAL DATA:  Severe low back and LEFT leg pain. EXAM: LUMBAR  MYELOGRAM FLUOROSCOPY TIME:  dictate in minutes and seconds PROCEDURE: After thorough discussion of risks and benefits of the procedure including bleeding, infection, injury to nerves, blood vessels, adjacent structures as well as headache and CSF leak, written and oral informed consent was obtained. Consent was obtained by Dr. Rolla Flatten. Time out form was completed. Patient was positioned prone on the fluoroscopy table. Local anesthesia was provided with 1% lidocaine without epinephrine after prepped and draped in the usual sterile fashion. Puncture was performed at L4 using a 3 1/2 inch 22-gauge spinal needle via midline approach. Using a single pass through the dura, the needle was placed within the thecal sac, with return of clear CSF. 15 mL of Isovue M 200 was injected into the thecal sac, with normal opacification of the nerve roots and cauda equina consistent with free flow within the subarachnoid space. I personally performed the lumbar puncture and administered the intrathecal contrast. I also personally supervised acquisition of the myelogram images. TECHNIQUE: Contiguous axial images were obtained through the Lumbar spine after the intrathecal infusion of infusion. Coronal and sagittal reconstructions were obtained of the axial image sets. COMPARISON:  Mixed injection myelogram 12/25/2014. MRI lumbar spine 09/26/2014. FINDINGS: LUMBAR MYELOGRAM FINDINGS: Good opacification lumbar subarachnoid space. 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 Severe disc space narrowing at L3-4 with vacuum phenomenon. Endplate reactive changes with diffuse loss of interspace height. Marked extraforaminal spurring extending to the LEFT greater than RIGHT. Fairly well preserved interspace at L2-3, with marked osseous spurring to the RIGHT. Mild disc space narrowing at L4-5 with a moderate-sized ventral defect representing soft disc protrusion. 2 mm anterolisthesis with  patient prone. LEFT L5 nerve root impingement is noted. No disc space narrowing at L5-S1 or significant extradural defect. Anatomic alignment otherwise. With the patient upright, there is increasingly severe loss of interspace height at L3-4 to the LEFT, resulting in a mild degenerative scoliosis convex RIGHT of 14 degrees. Severe LEFT greater than RIGHT L4 nerve root  impingement. LEFT L5 nerve root impingement does not significantly worsen with patient standing. In addition, with the patient upright, significant retrolisthesis develops at L3-4. This measures 5 mm in neutral, 6 mm in flexion, and 5 mm in extension. No significant instability at L4-5. CT LUMBAR MYELOGRAM FINDINGS: No prevertebral or paraspinous masses. No worrisome osseous lesions. Atherosclerosis without aneurysmal dilatation. No renal masses. Osteopenia. L1-L2:  Anterior osseous spurring.  No impingement. L2-L3: Marked extraforaminal osseous spurring greater on the RIGHT. Mild annular bulging centrally. No impingement. L3-L4: Severe endplate degenerative change and vacuum phenomenon, but no anterolisthesis with patient recumbent. Small RIGHT laminotomy. Posterior element hypertrophy. Recurrent central disc extrusion, likely free fragment in the canal results in near complete block. BILATERAL L4 nerve root impingement due to compression of the thecal sac and subarticular zone narrowing. BILATERAL disc material extends to foramina with compression of the L3 nerve roots. L4-L5: 2 mm anterolisthesis. RIGHT laminotomy. Advanced facet arthropathy with central protrusion and LEFT greater than RIGHT facet arthropathy. LEFT subarticular zone narrowing effaces the L5 nerve root. Disc material extends into both foramina ; either L4 nerve root could be affected. L5-S1: Shallow central protrusion. RIGHT laminotomy. Mild facet arthropathy. No impingement. Compared with the preoperative MRI from September 2015, the stenosis at L3-4 appears more severe. The  stenosis at L4-5 and L5-S1 slightly improved on the RIGHT, but equally severe on the LEFT. 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 Dynamic instability at L3-4 with patient standing. 14 degrees of degenerative scoliosis convex RIGHT develops, along with up to 6 mm of retrolisthesis L3 on L4 in flexion. 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 This is noted even though there is no subluxation with patient recumbent for CT. BILATERAL subarticular zone and foraminal zone narrowing is present. RIGHT L4 and L5 laminotomies. Asymmetric subarticular zone narrowing at L4-5 on the LEFT relates primarily to facet disease and central protrusion. Electronically Signed   By: Staci Righter M.D.   On: 01/13/2015 16:05   Dg C-arm 1-60 Min  02/02/2015  CLINICAL DATA:  L3-L4 PLIF EXAM: DG C-ARM 61-120 MIN; LUMBAR SPINE - 2-3 VIEW COMPARISON:  CT lumbar spine 01/13/2015 FLUOROSCOPY TIME:  0 minutes 12 seconds Images obtained: 2 FINDINGS: Five lumbar vertebra labeled on prior lumbar CT. Disc space narrowing L3-L4. BILATERAL pedicle screws have been placed at L3 and L4. Bones appear demineralized. Vertebral body heights maintained. IMPRESSION: Intraoperative images during L3-L4 posterior fusion as above. Electronically Signed   By: Lavonia Dana M.D.   On: 02/02/2015 15:39   Dg Myelography Lumbar Inj Lumbosacral  01/13/2015  CLINICAL DATA:  Severe low back and LEFT leg pain. EXAM: LUMBAR MYELOGRAM FLUOROSCOPY TIME:  dictate in minutes and seconds PROCEDURE: After thorough discussion of risks and benefits of the procedure including bleeding, infection, injury to nerves, blood vessels, adjacent structures as well as headache and CSF leak, written and oral informed consent was obtained. Consent was obtained by Dr. Rolla Flatten. Time out form was completed.  Patient was positioned prone on the fluoroscopy table. Local anesthesia was provided with 1% lidocaine without epinephrine after prepped and draped in the usual sterile fashion. Puncture was performed at L4 using a 3 1/2 inch 22-gauge spinal needle via midline approach. Using a single pass through the dura, the needle was placed within the thecal sac, with return of clear CSF. 15 mL of Isovue M  200 was injected into the thecal sac, with normal opacification of the nerve roots and cauda equina consistent with free flow within the subarachnoid space. I personally performed the lumbar puncture and administered the intrathecal contrast. I also personally supervised acquisition of the myelogram images. TECHNIQUE: Contiguous axial images were obtained through the Lumbar spine after the intrathecal infusion of infusion. Coronal and sagittal reconstructions were obtained of the axial image sets. COMPARISON:  Mixed injection myelogram 12/25/2014. MRI lumbar spine 09/26/2014. FINDINGS: LUMBAR MYELOGRAM FINDINGS: Good opacification lumbar subarachnoid space. 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 Severe disc space narrowing at L3-4 with vacuum phenomenon. Endplate reactive changes with diffuse loss of interspace height. Marked extraforaminal spurring extending to the LEFT greater than RIGHT. Fairly well preserved interspace at L2-3, with marked osseous spurring to the RIGHT. Mild disc space narrowing at L4-5 with a moderate-sized ventral defect representing soft disc protrusion. 2 mm anterolisthesis with patient prone. LEFT L5 nerve root impingement is noted. No disc space narrowing at L5-S1 or significant extradural defect. Anatomic alignment otherwise. With the patient upright, there is increasingly severe loss of interspace height at L3-4 to the LEFT, resulting in a mild degenerative scoliosis convex RIGHT of 14 degrees. Severe LEFT greater than RIGHT L4 nerve root  impingement. LEFT L5 nerve root impingement does not significantly worsen with patient standing. In addition, with the patient upright, significant retrolisthesis develops at L3-4. This measures 5 mm in neutral, 6 mm in flexion, and 5 mm in extension. No significant instability at L4-5. CT LUMBAR MYELOGRAM FINDINGS: No prevertebral or paraspinous masses. No worrisome osseous lesions. Atherosclerosis without aneurysmal dilatation. No renal masses. Osteopenia. L1-L2:  Anterior osseous spurring.  No impingement. L2-L3: Marked extraforaminal osseous spurring greater on the RIGHT. Mild annular bulging centrally. No impingement. L3-L4: Severe endplate degenerative change and vacuum phenomenon, but no anterolisthesis with patient recumbent. Small RIGHT laminotomy. Posterior element hypertrophy. Recurrent central disc extrusion, likely free fragment in the canal results in near complete block. BILATERAL L4 nerve root impingement due to compression of the thecal sac and subarticular zone narrowing. BILATERAL disc material extends to foramina with compression of the L3 nerve roots. L4-L5: 2 mm anterolisthesis. RIGHT laminotomy. Advanced facet arthropathy with central protrusion and LEFT greater than RIGHT facet arthropathy. LEFT subarticular zone narrowing effaces the L5 nerve root. Disc material extends into both foramina ; either L4 nerve root could be affected. L5-S1: Shallow central protrusion. RIGHT laminotomy. Mild facet arthropathy. No impingement. Compared with the preoperative MRI from September 2015, the stenosis at L3-4 appears more severe. The stenosis at L4-5 and L5-S1 slightly improved on the RIGHT, but equally severe on the LEFT. 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 Dynamic instability at L3-4 with patient standing. 14 degrees of degenerative scoliosis convex RIGHT develops, along with up to 6 mm of retrolisthesis L3 on L4 in flexion.  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 This is noted even though there is no subluxation with patient recumbent for CT. BILATERAL subarticular zone and foraminal zone narrowing is present. RIGHT L4 and L5 laminotomies. Asymmetric subarticular zone narrowing at L4-5 on the LEFT relates primarily to facet disease and central protrusion. Electronically Signed   By: Staci Righter M.D.   On: 01/13/2015 16:05    Oren Binet, MD  [facility] Pager:[phone]  If 7PM-7AM,  please contact night-coverage [email or web address] Password [email or web address] 02/07/2015, 9:26 AM   LOS: 5 days

## 2015-02-07 NOTE — Discharge Summary (Addendum)
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 Bilateral radiculopathy. Status post decompression.  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 Bilateral radiculopathy. Status post decompression.  PROCEDURE: L3 removal of spinous process and lamina. Bilateral facetectomy. Lysis of adhesion to decompress the thecal and the L3 and L4 nerve root. Attempt to open disk space. Pedicle screws L3-L4. Posterolateral arthrodesis with BMP and autograft, Vitoss. Microscope.  Attending: Leeroy Cha, MD  Hospital Course:  The patient was admitted hospital and morning surgery taken the operating room by Dr. Joya Salm for the above listed operation. She was slow to ambulate postoperatively. She required a lot of work with physical therapy. Her blood sugar was higher than acceptable and the hospitalists were able to help [place] with getting that under control. Today she is doing very well and is medically and neurologically stable. She is discharged home at this time.  Discharged Medications: Resume home medications, increase NPH to 40 units with breakfast and 30 units at night, oxycodone for pain, bisoprolol 10mg  daily, cardura 8 mg daily, stop HCTZ, aldactone, lisinopril  Follow up: Dr. Joya Salm in 2 weeks

## 2015-02-10 NOTE — Anesthesia Postprocedure Evaluation (Signed)
Anesthesia Post Note  Patient: Courtney [name]  Procedure(s) Performed: Procedure(s) (LRB): Lumbar Three-Four Posterior lumbar interbody fusion (N/A)  Anesthetic complications: no Comments: Patient discharged prior to post op visit.  No apparent anesthetic complications    Last Vitals:  Filed Vitals:   02/07/15 0535 02/07/15 1025  BP: 124/64 132/58  Pulse: 60 69  Temp: 36.4 C 36.6 C  Resp: 18 18    Last Pain:  Filed Vitals:   02/07/15 1303  PainSc: 0-No pain                 Myrick Mcnairy,E. Neely Cecena

## 2015-08-23 ENCOUNTER — Other Ambulatory Visit: Payer: Self-pay | Admitting: Internal Medicine

## 2015-08-23 DIAGNOSIS — R921 Mammographic calcification found on diagnostic imaging of breast: Secondary | ICD-10-CM

## 2015-09-09 ENCOUNTER — Other Ambulatory Visit: Payer: Self-pay | Admitting: Gastroenterology

## 2015-09-09 ENCOUNTER — Ambulatory Visit: Payer: Medicare Other

## 2015-09-09 ENCOUNTER — Other Ambulatory Visit: Payer: Medicare Other

## 2015-09-09 DIAGNOSIS — R1310 Dysphagia, unspecified: Secondary | ICD-10-CM

## 2015-09-14 ENCOUNTER — Ambulatory Visit
Admission: RE | Admit: 2015-09-14 | Discharge: 2015-09-14 | Disposition: A | Discharge status: Home / Self Care | Payer: Medicare Other | Source: Ambulatory Visit | Attending: Internal Medicine | Admitting: Internal Medicine

## 2015-09-14 DIAGNOSIS — R921 Mammographic calcification found on diagnostic imaging of breast: Secondary | ICD-10-CM | POA: Insufficient documentation

## 2015-09-20 ENCOUNTER — Ambulatory Visit
Admission: RE | Admit: 2015-09-20 | Discharge: 2015-09-20 | Disposition: A | Discharge status: Home / Self Care | Payer: Medicare Other | Source: Ambulatory Visit | Attending: Gastroenterology | Admitting: Gastroenterology

## 2015-09-20 DIAGNOSIS — K449 Diaphragmatic hernia without obstruction or gangrene: Secondary | ICD-10-CM | POA: Insufficient documentation

## 2015-09-20 DIAGNOSIS — R1310 Dysphagia, unspecified: Secondary | ICD-10-CM | POA: Diagnosis not present

## 2015-11-19 IMAGING — CR DG LUMBAR SPINE 2-3V
1 series · 1 of 1 positions shown · non-contrast
Comparison: Lumbar spine radiographs dated 10/13/2013. MRI lumbar
spine dated 09/25/2013.

CLINICAL DATA: L3-S1 laminectomy

EXAM:
LUMBAR SPINE - 2-3 VIEW

[lat]
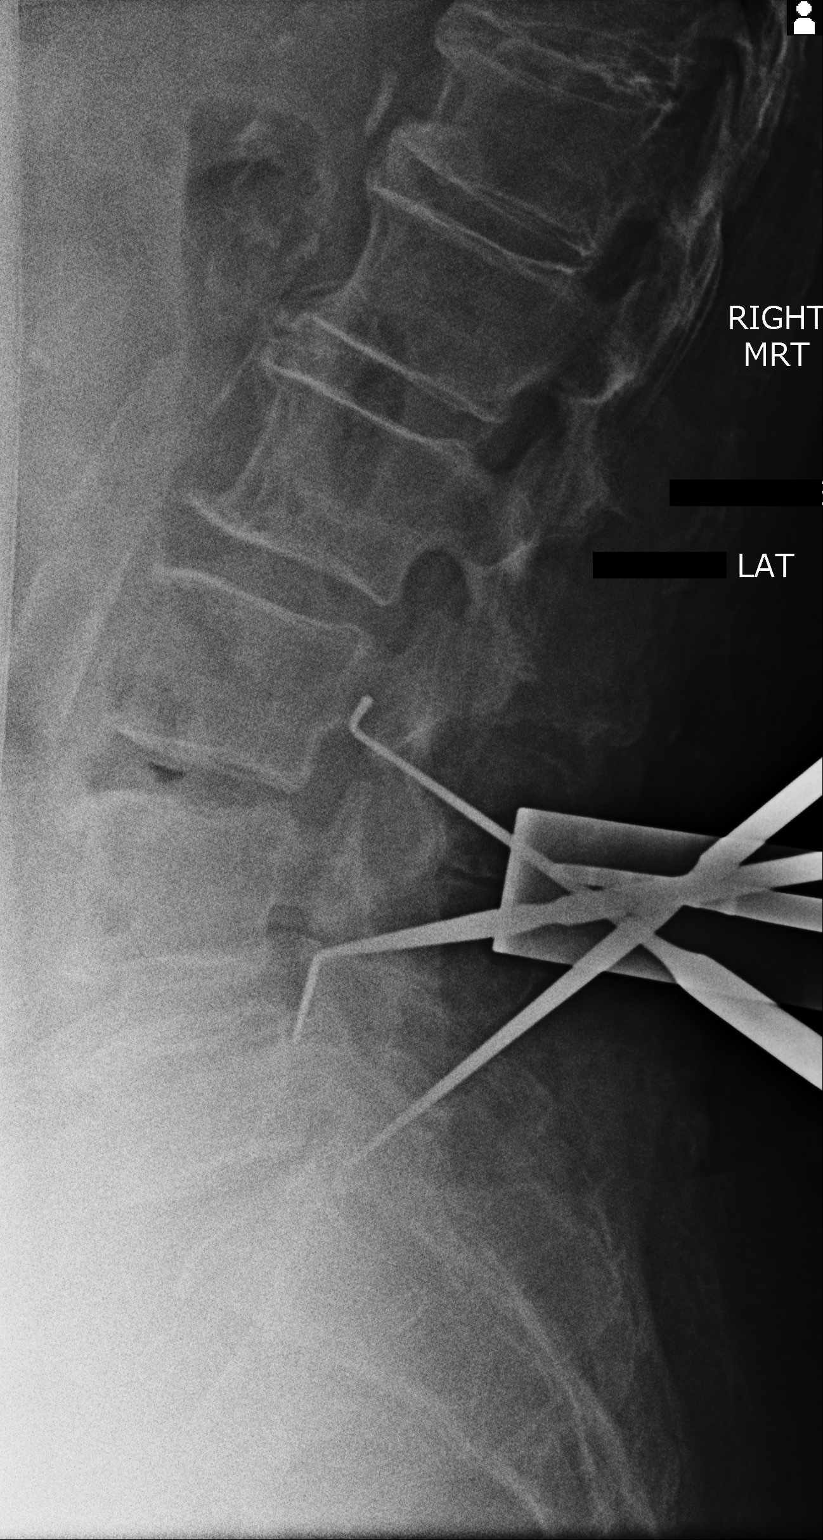

[1 of 1 positions shown; findings below may reference images not displayed]

FINDINGS: Initial lateral radiograph demonstrates a surgical probe at L4-5.

Second lateral radiograph demonstrates a surgical probe posterior to
L3 vertebral body, with additional probes at L4-5 and L5-S1.
IMPRESSION: Lumbar localization as above.

## 2015-12-31 ENCOUNTER — Ambulatory Visit: Payer: Medicare Other | Admitting: Anesthesiology

## 2015-12-31 ENCOUNTER — Encounter: Payer: Self-pay | Admitting: *Deleted

## 2015-12-31 ENCOUNTER — Ambulatory Visit
Admission: RE | Admit: 2015-12-31 | Discharge: 2015-12-31 | Disposition: A | Discharge status: Home / Self Care | Payer: Medicare Other | Source: Ambulatory Visit | Attending: Gastroenterology | Admitting: Gastroenterology

## 2015-12-31 ENCOUNTER — Encounter
Admission: RE | Disposition: A | Discharge status: Home / Self Care | Payer: Self-pay | Source: Ambulatory Visit | Attending: Gastroenterology

## 2015-12-31 DIAGNOSIS — Z886 Allergy status to analgesic agent status: Secondary | ICD-10-CM | POA: Insufficient documentation

## 2015-12-31 DIAGNOSIS — Z885 Allergy status to narcotic agent status: Secondary | ICD-10-CM | POA: Insufficient documentation

## 2015-12-31 DIAGNOSIS — E119 Type 2 diabetes mellitus without complications: Secondary | ICD-10-CM | POA: Insufficient documentation

## 2015-12-31 DIAGNOSIS — Z538 Procedure and treatment not carried out for other reasons: Secondary | ICD-10-CM | POA: Diagnosis not present

## 2015-12-31 DIAGNOSIS — Z9049 Acquired absence of other specified parts of digestive tract: Secondary | ICD-10-CM | POA: Insufficient documentation

## 2015-12-31 DIAGNOSIS — Z961 Presence of intraocular lens: Secondary | ICD-10-CM | POA: Diagnosis not present

## 2015-12-31 DIAGNOSIS — R1310 Dysphagia, unspecified: Secondary | ICD-10-CM | POA: Insufficient documentation

## 2015-12-31 DIAGNOSIS — Z79891 Long term (current) use of opiate analgesic: Secondary | ICD-10-CM | POA: Diagnosis not present

## 2015-12-31 DIAGNOSIS — Z9842 Cataract extraction status, left eye: Secondary | ICD-10-CM | POA: Insufficient documentation

## 2015-12-31 DIAGNOSIS — K219 Gastro-esophageal reflux disease without esophagitis: Secondary | ICD-10-CM | POA: Insufficient documentation

## 2015-12-31 DIAGNOSIS — M069 Rheumatoid arthritis, unspecified: Secondary | ICD-10-CM | POA: Insufficient documentation

## 2015-12-31 DIAGNOSIS — Z91041 Radiographic dye allergy status: Secondary | ICD-10-CM | POA: Insufficient documentation

## 2015-12-31 DIAGNOSIS — Z9841 Cataract extraction status, right eye: Secondary | ICD-10-CM | POA: Insufficient documentation

## 2015-12-31 DIAGNOSIS — R0902 Hypoxemia: Secondary | ICD-10-CM | POA: Diagnosis not present

## 2015-12-31 DIAGNOSIS — Z85828 Personal history of other malignant neoplasm of skin: Secondary | ICD-10-CM | POA: Insufficient documentation

## 2015-12-31 DIAGNOSIS — Z794 Long term (current) use of insulin: Secondary | ICD-10-CM | POA: Diagnosis not present

## 2015-12-31 DIAGNOSIS — L719 Rosacea, unspecified: Secondary | ICD-10-CM | POA: Diagnosis not present

## 2015-12-31 DIAGNOSIS — Z9889 Other specified postprocedural states: Secondary | ICD-10-CM | POA: Diagnosis not present

## 2015-12-31 DIAGNOSIS — Z888 Allergy status to other drugs, medicaments and biological substances status: Secondary | ICD-10-CM | POA: Insufficient documentation

## 2015-12-31 DIAGNOSIS — I998 Other disorder of circulatory system: Secondary | ICD-10-CM | POA: Insufficient documentation

## 2015-12-31 DIAGNOSIS — Z79899 Other long term (current) drug therapy: Secondary | ICD-10-CM | POA: Insufficient documentation

## 2015-12-31 DIAGNOSIS — G4730 Sleep apnea, unspecified: Secondary | ICD-10-CM | POA: Insufficient documentation

## 2015-12-31 DIAGNOSIS — K529 Noninfective gastroenteritis and colitis, unspecified: Secondary | ICD-10-CM | POA: Diagnosis present

## 2015-12-31 DIAGNOSIS — Z7982 Long term (current) use of aspirin: Secondary | ICD-10-CM | POA: Insufficient documentation

## 2015-12-31 DIAGNOSIS — I10 Essential (primary) hypertension: Secondary | ICD-10-CM | POA: Insufficient documentation

## 2015-12-31 HISTORY — PX: ESOPHAGOGASTRODUODENOSCOPY (EGD) WITH PROPOFOL: SHX5813

## 2015-12-31 HISTORY — PX: COLONOSCOPY WITH PROPOFOL: SHX5780

## 2015-12-31 LAB — GLUCOSE, CAPILLARY: GLUCOSE-CAPILLARY: 119 mg/dL — AB (ref 65–99)

## 2015-12-31 SURGERY — COLONOSCOPY WITH PROPOFOL
Anesthesia: General

## 2015-12-31 MED ORDER — PROPOFOL 10 MG/ML IV BOLUS
INTRAVENOUS | Status: DC | PRN
  Administered 2015-12-31 (×4): 20 mg via INTRAVENOUS

## 2015-12-31 MED ORDER — ONDANSETRON HCL 4 MG/2ML IJ SOLN
INTRAMUSCULAR | Status: AC
  Filled 2015-12-31: qty 2

## 2015-12-31 MED ORDER — SODIUM CHLORIDE 0.9 % IV SOLN: INTRAVENOUS | Status: DC

## 2015-12-31 MED ORDER — ONDANSETRON HCL 4 MG/2ML IJ SOLN
INTRAMUSCULAR | Status: DC | PRN
  Administered 2015-12-31: 4 mg via INTRAVENOUS

## 2015-12-31 MED ORDER — SODIUM CHLORIDE 0.9 % IV SOLN
INTRAVENOUS | Status: DC
  Administered 2015-12-31: 07:00:00 via INTRAVENOUS

## 2015-12-31 MED ORDER — PROPOFOL 500 MG/50ML IV EMUL
INTRAVENOUS | Status: DC | PRN
  Administered 2015-12-31: 80 ug/kg/min via INTRAVENOUS

## 2015-12-31 NOTE — H&P (Signed)
Outpatient short stay form Pre-procedure 12/31/2015 7:35 AM Lollie Sails MD  Primary Physician: Dr. Emily Filbert  Reason for visit:  EGD and colonoscopy  History of present illness:  Patient is a 75 year old female presenting today as above. She does have a history of some dysphagia however this a bit unusual in its more so to liquids than solids. She has no pill dysphagia. He does not regurgitate foods. 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 She is also been having problems with loose stools mainly in the morning when she awakens with some urgency. Does not have any fecal incontinence. sHe is on multiple medications    Current Facility-Administered Medications:  .  0.9 %  sodium chloride infusion, , Intravenous, Continuous, Lollie Sails, MD .  0.9 %  sodium chloride infusion, , Intravenous, Continuous, Lollie Sails, MD  Prescriptions Prior to Admission  Medication Sig Dispense Refill Last Dose  . bisoprolol (ZEBETA) 10 MG tablet Take 1 tablet (10 mg total) by mouth daily. 30 tablet 1 12/31/2015 at 0630  . doxazosin (CARDURA) 8 MG tablet Take 8 mg by mouth daily.    Past Week at Unknown time  . hydrOXYzine (ATARAX/VISTARIL) 25 MG tablet Take 25 mg by mouth 3 (three) times daily.   12/30/2015 at Unknown time  . insulin NPH Human (HUMULIN N,NOVOLIN N) 100 UNIT/ML injection Inject 0.3 mLs (30 Units total) into the skin at bedtime. 10 mL 11 12/30/2015 at Unknown time  . insulin NPH Human (HUMULIN N,NOVOLIN N) 100 UNIT/ML injection Inject 0.4 mLs (40 Units total) into the skin daily before breakfast. 10 mL 11 12/30/2015 at Unknown time  . insulin regular (NOVOLIN R,HUMULIN R) 100 units/mL injection Inject 5-15 Units into the skin 2 (two) times daily. 5-15 units twice per day   12/30/2015 at Unknown time  . leflunomide  (ARAVA) 10 MG tablet Take 10 mg by mouth daily.   12/31/2015 at 0630  . omeprazole (PRILOSEC) 20 MG capsule Take 20 mg by mouth daily.   12/30/2015 at Unknown time  . ranitidine (ZANTAC) 150 MG tablet Take 150 mg by mouth 2 (two) times daily.   Past Week at Unknown time  . simvastatin (ZOCOR) 40 MG tablet Take 40 mg by mouth daily.    Past Week at Unknown time  . aspirin EC 81 MG tablet Take 81 mg by mouth daily.   12/27/2015  . diazepam (VALIUM) 5 MG tablet Take 1 tablet (5 mg total) by mouth every 6 (six) hours as needed for muscle spasms. 60 tablet 0   . inFLIXimab (REMICADE) 100 MG injection Inject 500 mg into the vein every 6 (six) weeks.   More than a month at Unknown time  . levocetirizine (XYZAL) 5 MG tablet Take 5 mg by mouth every evening.   02/01/2015 at Unknown time  . methylPREDNISolone (MEDROL) 4 MG tablet Dispense 1 Medrol Dose Pak, take as directed 21 tablet 0   . oxyCODONE 10 MG TABS Take 1 tablet (10 mg total) by mouth every 3 (three) hours as needed for severe pain. 60 tablet 0      Allergies  Allergen Reactions  . Methotrexate Derivatives Shortness Of Breath    Breathing difficulties   . Acyclovir And Related   . Codeine Nausea Only  . Contrast Media [Iodinated Diagnostic Agents] Itching and Rash  . Inderide [  Propranolol-Hctz] Rash  . Lodine [Etodolac] Rash  . Procardia [Nifedipine] Rash     Past Medical History:  Diagnosis Date  . Arthritis    rheumatoid arthritis  . Cancer ([place])    skin cancer (squamous cell)  . Collagen vascular disease (HCC)    RA  . Dermatitis   . Diabetes mellitus without complication ([place])    type 2  . Eczema   . GERD (gastroesophageal reflux disease)   . Headache    migraines in the past (none since menopause)  . Hives    "chronic"  . Hypertension   . PONV (postoperative nausea and vomiting)   . Rosacea   . Sleep apnea 2004   does not use cpap  . Varicose veins     Review of systems:      Physical Exam    Heart and  lungs: Regular rate and rhythm without rub or gallop, lungs are bilaterally clear.    HEENT: Normocephalic atraumatic eyes are anicteric    Other:     Pertinant exam for procedure: Soft nontender nondistended bowel sounds positive normoactive.    Planned proceedures: EGD and colonoscopy with indicated procedures. I have discussed the risks benefits and complications of procedures to include not limited to bleeding, infection, perforation and the risk of sedation and the patient wishes to proceed.    Lollie Sails, MD Gastroenterology 12/31/2015  7:35 AM

## 2015-12-31 NOTE — Op Note (Signed)
[facility] Gastroenterology Patient Name: Courtney [name] Procedure Date: 12/31/2015 7:39 AM MRN: [id number] Account #: [id number] Date of Birth: 03-30-1940 Admit Type: Outpatient Age: 75 Room: [facility] ENDO ROOM 3 Gender: Female Note Status: Finalized Procedure:            Upper GI endoscopy Providers:            Lollie Sails, MD Referring MD:         Rusty Aus, MD (Referring MD) Medicines:            Monitored Anesthesia Care Complications:        No immediate complications. Procedure:            Pre-Anesthesia Assessment:                       - ASA Grade Assessment: III - A patient with severe                        systemic disease.                       After obtaining informed consent, the endoscope was                        passed under direct vision. Throughout the procedure,                        the patient's blood pressure, pulse, and oxygen                        saturations were monitored continuously. The procedure                        was aborted. The scope was not inserted. Medications                        were given. The patient tolerated the procedure poorly                        due to the patient's respiratory instability (hypoxia). Findings:      Attempt was made to introduce the scope several times, each time having       coughing and laryngeal spasm occur. There is noted a marked arytenoid       edema and peri laryngeal erythema.      noted Impression:           - No specimens collected.                       - The procedure was aborted. Recommendation:       - arrange to see PMD or pulmonologist for further                        evaluation and reschedule EGD ond upper respiratory                        issues improved. Diagnosis Code(s):    --- Professional ---                       Z53.8, Procedure and treatment not carried out for  other reasons Lollie Sails, MD 12/31/2015 8:33:38 AM This  report has been signed electronically. Number of Addenda: 0 Note Initiated On: 12/31/2015 7:39 AM Scope Withdrawal Time: 0 hours 4 minutes 52 seconds  Total Procedure Duration: 0 hours 12 minutes 48 seconds       [facility]

## 2015-12-31 NOTE — Transfer of Care (Signed)
Immediate Anesthesia Transfer of Care Note  Patient: Courtney [name]  Procedure(s) Performed: Procedure(s): COLONOSCOPY WITH PROPOFOL (N/A) ESOPHAGOGASTRODUODENOSCOPY (EGD) WITH PROPOFOL (N/A)  Patient Location: PACU  Anesthesia Type:General  Level of Consciousness: awake, alert  and oriented  Airway & Oxygen Therapy: Patient Spontanous Breathing  Post-op Assessment: Report given to RN and Post -op Vital signs reviewed and stable  Post vital signs: Reviewed and stable  Last Vitals:  Vitals:   12/31/15 0701  BP: (!) 169/63  Pulse: 76  Resp: 16  Temp: 36.3 C    Last Pain:  Vitals:   12/31/15 0701  TempSrc: Tympanic         Complications: No apparent anesthesia complications

## 2015-12-31 NOTE — Anesthesia Postprocedure Evaluation (Signed)
Anesthesia Post Note  Patient: Courtney [name]  Procedure(s) Performed: Procedure(s) (LRB): COLONOSCOPY WITH PROPOFOL (N/A) ESOPHAGOGASTRODUODENOSCOPY (EGD) WITH PROPOFOL (N/A)  Anesthesia Post Evaluation   Last Vitals:  Vitals:   12/31/15 0701  BP: (!) 169/63  Pulse: 76  Resp: 16  Temp: 36.3 C    Last Pain:  Vitals:   12/31/15 0701  TempSrc: Tympanic                 [place],  Diego Cory

## 2015-12-31 NOTE — Transfer of Care (Signed)
Immediate Anesthesia Transfer of Care Note  Patient: Courtney [name]  Procedure(s) Performed: Procedure(s): COLONOSCOPY WITH PROPOFOL (N/A) ESOPHAGOGASTRODUODENOSCOPY (EGD) WITH PROPOFOL (N/A)  Patient Location: PACU and ICU  Anesthesia Type:General  Level of Consciousness: awake, alert  and oriented  Airway & Oxygen Therapy: Patient Spontanous Breathing  Post-op Assessment: Report given to RN and Post -op Vital signs reviewed and stable  Post vital signs: Reviewed and stable  Last Vitals:  Vitals:   12/31/15 0701  BP: (!) 169/63  Pulse: 76  Resp: 16  Temp: 36.3 C    Last Pain:  Vitals:   12/31/15 0701  TempSrc: Tympanic         Complications: No apparent anesthesia complications

## 2015-12-31 NOTE — Anesthesia Preprocedure Evaluation (Signed)
Anesthesia Evaluation  Patient identified by MRN, date of birth, ID band Patient awake    Reviewed: Allergy & Precautions, H&P , NPO status , Patient's Chart, lab work & pertinent test results  History of Anesthesia Complications (+) PONV and history of anesthetic complications  Airway Mallampati: III  TM Distance: <3 FB Neck ROM: limited    Dental  (+) Poor Dentition, Chipped, Missing, Upper Dentures, Partial Lower   Pulmonary neg shortness of breath, sleep apnea ,    Pulmonary exam normal breath sounds clear to auscultation       Cardiovascular Exercise Tolerance: Poor hypertension, (-) angina(-) Past MI and (-) DOE Normal cardiovascular exam Rhythm:regular Rate:Normal     Neuro/Psych  Headaches, negative psych ROS   GI/Hepatic Neg liver ROS, GERD  Controlled,  Endo/Other  diabetes, Type 2, Insulin Dependent  Renal/GU Renal disease  negative genitourinary   Musculoskeletal  (+) Arthritis ,   Abdominal   Peds  Hematology negative hematology ROS (+)   Anesthesia Other Findings Past Medical History: No date: Arthritis     Comment: rheumatoid arthritis No date: Cancer ([place])     Comment: skin cancer (squamous cell) No date: Collagen vascular disease (HCC)     Comment: RA No date: Dermatitis No date: Diabetes mellitus without complication (HCC)     Comment: type 2 No date: Eczema No date: GERD (gastroesophageal reflux disease) No date: Headache     Comment: migraines in the past (none since menopause) No date: Hives     Comment: "chronic" No date: Hypertension No date: PONV (postoperative nausea and vomiting) No date: Rosacea 2004: Sleep apnea     Comment: does not use cpap No date: Varicose veins  Past Surgical History: No date: BILATERAL CARPAL TUNNEL RELEASE No date: BREAST CYST ASPIRATION Left     Comment: neg 1963: CHOLECYSTECTOMY 1991: ENDOMETRIAL ABLATION 2006 and 2012: EYE SURGERY  Bilateral     Comment: cataract surgery with lens implant 2014: eyelid surgery Bilateral No date: FOOT SURGERY Right     Comment: ligament and spurs 12/24/2013: LUMBAR LAMINECTOMY/DECOMPRESSION MICRODISCECTO* Right     Comment: Procedure: RIGHT LUMBAR THREE TO FOUR, LUMBAR               FOUR TO FIVE, LUMBAR FIVE TO SACRAL ONE               LAMINECTOMY/FORAMINOTOMY;  Surgeon: Floyce Stakes, MD;  Location: [facility] NEURO ORS;  Service:               Neurosurgery;  Laterality: Right;  RIGHT L3-4               L4-5 L5-S1 LAMINECTOMY/FORAMINOTOMY 2004: NASAL SEPTUM SURGERY No date: SHOULDER SURGERY Right     Comment: for a frozen shoulder No date: SKIN CANCER EXCISION Right     Comment: leg x 4 1968: TUBAL LIGATION  BMI    Body Mass Index:  29.65 kg/m      Reproductive/Obstetrics negative OB ROS                             Anesthesia Physical Anesthesia Plan  ASA: III  Anesthesia Plan: General   Post-op Pain Management:    Induction:   Airway Management Planned:   Additional Equipment:   Intra-op Plan:   Post-operative Plan:   Informed Consent: I have reviewed the patients  History and Physical, chart, labs and discussed the procedure including the risks, benefits and alternatives for the proposed anesthesia with the patient or authorized representative who has indicated his/her understanding and acceptance.   Dental Advisory Given  Plan Discussed with: Anesthesiologist, CRNA and Surgeon  Anesthesia Plan Comments:         Anesthesia Quick Evaluation

## 2015-12-31 NOTE — Op Note (Signed)
[facility] Gastroenterology Patient Name: Courtney [name] Procedure Date: 12/31/2015 7:39 AM MRN: [id number] Account #: [id number] Date of Birth: [birth year] Admit Type: Outpatient Age: 75 Room: [facility] ENDO ROOM 3 Gender: Female Note Status: Finalized Procedure:            Colonoscopy Indications:          Chronic diarrhea Providers:            Lollie Sails, MD Referring MD:         Rusty Aus, MD (Referring MD) Medicines:            Monitored Anesthesia Care Complications:        No immediate complications. Procedure:            Pre-Anesthesia Assessment:                       - ASA Grade Assessment: III - A patient with severe                        systemic disease.                       After obtaining informed consent, the colonoscope was                        passed under direct vision. Throughout the procedure,                        the patient's blood pressure, pulse, and oxygen                        saturations were monitored continuously. The                        Colonoscope was introduced through the anus and                        advanced to the the cecum, identified by appendiceal                        orifice and ileocecal valve. The colonoscopy was                        performed without difficulty. The patient tolerated the                        procedure well. The quality of the bowel preparation                        was good. Findings:      The colon (entire examined portion) appeared normal.      The retroflexed view of the distal rectum and anal verge was normal and       showed no anal or rectal abnormalities.      Biopsies for histology were taken with a cold forceps from the right       colon and left colon for evaluation of microscopic colitis.      The digital rectal exam was normal. Impression:           - The entire examined colon is normal.                       -  The distal rectum and anal verge are normal on                     retroflexion view.                       - Biopsies were taken with a cold forceps from the                        right colon and left colon for evaluation of                        microscopic colitis. Recommendation:       - Discharge patient to home. Procedure Code(s):    --- Professional ---                       [phone], Colonoscopy, flexible; with biopsy, single or                        multiple Diagnosis Code(s):    --- Professional ---                       K52.9, Noninfective gastroenteritis and colitis,                        unspecified CPT copyright 2016 [employer]. All rights reserved. The codes documented in this report are preliminary and upon coder review may  be revised to meet current compliance requirements. Lollie Sails, MD 12/31/2015 8:29:05 AM This report has been signed electronically. Number of Addenda: 0 Note Initiated On: 12/31/2015 7:39 AM      [facility]

## 2016-01-04 LAB — SURGICAL PATHOLOGY

## 2016-01-05 ENCOUNTER — Encounter: Payer: Self-pay | Admitting: Gastroenterology

## 2016-01-07 ENCOUNTER — Other Ambulatory Visit: Payer: Self-pay | Admitting: Internal Medicine

## 2016-01-07 DIAGNOSIS — I091 Rheumatic diseases of endocardium, valve unspecified: Secondary | ICD-10-CM | POA: Insufficient documentation

## 2016-01-07 DIAGNOSIS — R918 Other nonspecific abnormal finding of lung field: Secondary | ICD-10-CM

## 2016-01-12 ENCOUNTER — Ambulatory Visit
Admission: RE | Admit: 2016-01-12 | Discharge: 2016-01-12 | Disposition: A | Discharge status: Home / Self Care | Payer: Medicare Other | Source: Ambulatory Visit | Attending: Internal Medicine | Admitting: Internal Medicine

## 2016-01-12 DIAGNOSIS — I2510 Atherosclerotic heart disease of native coronary artery without angina pectoris: Secondary | ICD-10-CM | POA: Diagnosis not present

## 2016-01-12 DIAGNOSIS — I700 Atherosclerosis of aorta: Secondary | ICD-10-CM | POA: Insufficient documentation

## 2016-01-12 DIAGNOSIS — R918 Other nonspecific abnormal finding of lung field: Secondary | ICD-10-CM | POA: Diagnosis present

## 2016-01-12 LAB — POCT I-STAT CREATININE: Creatinine, Ser: 0.9 mg/dL (ref 0.44–1.00)

## 2016-01-12 MED ORDER — IOPAMIDOL (ISOVUE-300) INJECTION 61%
75.0000 mL | Freq: Once | INTRAVENOUS | Status: AC | PRN
  Administered 2016-01-12: 75 mL via INTRAVENOUS

## 2016-01-13 DIAGNOSIS — I700 Atherosclerosis of aorta: Secondary | ICD-10-CM | POA: Insufficient documentation

## 2016-02-16 DIAGNOSIS — I10 Essential (primary) hypertension: Secondary | ICD-10-CM | POA: Insufficient documentation

## 2016-02-16 DIAGNOSIS — E782 Mixed hyperlipidemia: Secondary | ICD-10-CM | POA: Insufficient documentation

## 2016-02-23 ENCOUNTER — Ambulatory Visit: Payer: Medicare Other | Attending: Internal Medicine

## 2016-02-23 DIAGNOSIS — R262 Difficulty in walking, not elsewhere classified: Secondary | ICD-10-CM | POA: Diagnosis present

## 2016-02-23 DIAGNOSIS — M6281 Muscle weakness (generalized): Secondary | ICD-10-CM | POA: Diagnosis present

## 2016-02-24 NOTE — Therapy (Signed)
[place] MAIN [facility] SERVICES [address] [place], [place], [address] Phone: [phone]   Fax:  [phone]  Physical Therapy Evaluation  Patient Details  Name: Courtney [name] MRN: [id number] Date of Birth: [birth year] Referring Provider: Emily Filbert MD  Encounter Date: 02/23/2016      PT End of Session - 02/23/16 1720    Visit Number 1   Number of Visits 24   Date for PT Re-Evaluation 05/17/16   Authorization Type 1/10 G Code   PT Start Time [id number]   PT Stop Time 1700   PT Time Calculation (min) 55 min   Equipment Utilized During Treatment Gait belt   Activity Tolerance Patient tolerated treatment well;No increased pain;Patient limited by fatigue   Behavior During Therapy [facility] for tasks assessed/performed      Past Medical History:  Diagnosis Date  . Arthritis    rheumatoid arthritis  . Cancer ([place])    skin cancer (squamous cell)  . Collagen vascular disease (HCC)    RA  . Dermatitis   . Diabetes mellitus without complication ([place])    type 2  . Eczema   . GERD (gastroesophageal reflux disease)   . Headache    migraines in the past (none since menopause)  . Hives    "chronic"  . Hypertension   . PONV (postoperative nausea and vomiting)   . Rosacea   . Sleep apnea 2004   does not use cpap  . Varicose veins     Past Surgical History:  Procedure Laterality Date  . BILATERAL CARPAL TUNNEL RELEASE    . BREAST CYST ASPIRATION Left    neg  . CHOLECYSTECTOMY  1963  . COLONOSCOPY WITH PROPOFOL N/A 12/31/2015   Procedure: COLONOSCOPY WITH PROPOFOL;  Surgeon: Lollie Sails, MD;  Location: [facility] ENDOSCOPY;  Service: Endoscopy;  Laterality: N/A;  . ENDOMETRIAL ABLATION  1991  . ESOPHAGOGASTRODUODENOSCOPY (EGD) WITH PROPOFOL N/A 12/31/2015   Procedure: ESOPHAGOGASTRODUODENOSCOPY (EGD) WITH PROPOFOL;  Surgeon: Lollie Sails, MD;  Location: [facility] ENDOSCOPY;  Service: Endoscopy;  Laterality: N/A;  . EYE SURGERY Bilateral 2006  and 2012   cataract surgery with lens implant  . eyelid surgery Bilateral 2014  . FOOT SURGERY Right    ligament and spurs  . LUMBAR LAMINECTOMY/DECOMPRESSION MICRODISCECTOMY Right 12/24/2013   Procedure: RIGHT LUMBAR THREE TO FOUR, LUMBAR FOUR TO FIVE, LUMBAR FIVE TO SACRAL ONE LAMINECTOMY/FORAMINOTOMY;  Surgeon: Floyce Stakes, MD;  Location: [facility] NEURO ORS;  Service: Neurosurgery;  Laterality: Right;  RIGHT L3-4 L4-5 L5-S1 LAMINECTOMY/FORAMINOTOMY  . NASAL SEPTUM SURGERY  2004  . SHOULDER SURGERY Right    for a frozen shoulder  . SKIN CANCER EXCISION Right    leg x 4  . TUBAL LIGATION  1968    There were no vitals filed for this visit.       Subjective Assessment - 02/23/16 1622    Subjective Patient with history of RA presents with difficulty with walking (DOE), transferring from sitting to standing, asending the stairs (requires use of rail), cooking an entire meal (unable to stand for prolonged periods of time), and standing. Paitent reports increased L hip and B knee pain last Friday from insidious onset which patient states is from the RA. Patient reports she's receiving an injection in her L hip to help the pain. Patient denies bowel/bladder issues    Pertinent History Hx: (January 2017, December 2015) Back surgery, RA, diabetes   Limitations Lifting   How long can you  stand comfortably? 28min   How long can you walk comfortably? 13min   Patient Stated Goals "to be able to do something without my legs giving out"   Currently in Pain? Yes   Pain Score 6    Pain Location Hip   Pain Orientation Left   Pain Descriptors / Indicators Constant;Aching   Pain Type Chronic pain   Pain Onset In the past 7 days   Pain Frequency Constant            [facility] PT Assessment - 02/23/16 1616      Assessment   Medical Diagnosis RA   Referring Provider Emily Filbert MD   Onset Date/Surgical Date 01/09/14   Hand Dominance Right   Next MD Visit unknown   Prior Therapy yes      Precautions   Precautions Fall     Restrictions   Weight Bearing Restrictions No     Balance Screen   Has the patient fallen in the past 6 months No   Has the patient had a decrease in activity level because of a fear of falling?  Yes   Is the patient reluctant to leave their home because of a fear of falling?  No     Home Ecologist residence   Living Arrangements Spouse/significant other   Available Help at Discharge Family   Type of [place] to enter   Entrance Stairs-Number of Steps 1   Entrance Stairs-Rails Right   Home Layout One level   [place] bars - toilet;Grab bars - tub/shower;Tub bench;Walker - 2 wheels  SPC     Prior Function   Level of Independence Independent   Vocation Full time employment;Retired   Biomedical scientist N/A`   Leisure Designer, multimedia   Overall Cognitive Status Within Functional Limits for tasks assessed     ROM / Strength   AROM / PROM / Strength Strength     Strength   Strength Assessment Site Hip;Knee;Ankle   Right/Left Hip Right;Left   Right Hip Flexion 4/5   Right Hip ABduction 4+/5   Right Hip ADduction 4/5   Left Hip Flexion 4/5   Left Hip ABduction 4/5   Left Hip ADduction 4/5   Right/Left Knee Right;Left   Right Knee Flexion 4-/5   Right Knee Extension 4+/5   Left Knee Flexion 3+/5   Left Knee Extension 4/5   Right/Left Ankle Right;Left   Right Ankle Dorsiflexion 4-/5   Right Ankle Plantar Flexion 4-/5   Left Ankle Dorsiflexion 3+/5   Left Ankle Plantar Flexion 4-/5     Ambulation/Gait   Ambulation Distance (Feet) 1020 Feet   Gait Pattern Step-to pattern;Decreased step length - right;Decreased step length - left;Decreased arm swing - right;Decreased arm swing - left;Decreased stance time - left;Decreased stride length;Decreased weight shift to left  Bilateral increased hip ER     6 Minute Walk- Baseline   6 Minute Walk- Baseline yes   BP  (mmHg) 162/72   Modified Borg Scale for Dyspnea 0- Nothing at all   Perceived Rate of Exertion (Borg) --     6 Minute walk- Post Test   6 Minute Walk Post Test yes   BP (mmHg) 189/74  After 2 min of rest: patient's BP drops to 164/69 (baseline)   Modified Borg Scale for Dyspnea 7- Severe shortness of breath or very hard breathing     6 minute walk  test results    Aerobic Endurance Distance Walked 1020     Standardized Balance Assessment   Standardized Balance Assessment Timed Up and Go Test;Five Times Sit to Stand;10 meter walk test   Five times sit to stand comments  18sec with use of hands   10 Meter Walk .16m/s     Timed Up and Go Test   Normal TUG (seconds) 8     TREATMENT: Sit to stands -- x 10 Tandem stance with widened stance -- 2 x 20sec Standing hip abduction -- x 10; decreased AROM on R LE secondary to increased L hip pain  Patient required cueing on form throughout treatment.          PT Education - 02/23/16 1719    Education provided Yes   Education Details HEP: widened tandem balance, hip abduction, sit to stands   Person(s) Educated Patient   Methods Explanation;Demonstration   Comprehension Verbalized understanding;Returned demonstration             PT Long Term Goals - March 08, 2016 0957      PT LONG TERM GOAL #1   Title Patient will be independent with HEP to continue improvement from physical therapy after discharge.    Baseline dependent with form/technique   Time 12   Period Weeks   Status New     PT LONG TERM GOAL #2   Title Patient will improve 37min walk test to 1224ft to decrease fall risk and improve ability to ambulate at community distances   Baseline 1019ft   Time 12   Period Weeks   Status New     PT LONG TERM GOAL #3   Title Patient will improve 5xSTS to >16sec without use of hands to indicating a decrease in fall risk and to better able to stand after siting for prolonged periods of time.    Baseline 18sec with use of hands    Time 12   Period Weeks   Status New               Plan - 03/08/2016 0807    Clinical Impression Statement Pt is a 76 yo right hand dominant female demonstrating increased fall risk and weakness secondary to generalized weakness and hx of RA. Patient's weakness and fall risk are indicated by decreased 99minwt scores, 5xSTS, and decreased MMT. Patient also demonstrates DOE when walking for long periods of time and will benefit from aquatic therapy for gradual strengthening and improving balance to decrease fall risk.    Rehab Potential Fair   Clinical Impairments Affecting Rehab Potential (-) RA diagnosis, chronicity condition   PT Frequency 2x / week   PT Duration 12 weeks   PT Treatment/Interventions Electrical Stimulation;Cryotherapy;Aquatic Therapy;ADLs/Self Care Home Management;Iontophoresis 4mg /ml Dexamethasone;Moist Heat;Ultrasound;Balance training;Therapeutic activities;Therapeutic exercise;Functional mobility training;Stair training;Gait training;Neuromuscular re-education;Patient/family education;Manual techniques   PT Next Visit Plan Aquatic therapy to progress LE strength   PT Home Exercise Plan Sit to stands, Widened tandem stance, hip abduction in standing   Consulted and Agree with Plan of Care Patient      Patient will benefit from skilled therapeutic intervention in order to improve the following deficits and impairments:  Abnormal gait, Pain, Improper body mechanics, Decreased mobility, Decreased coordination, Decreased range of motion, Decreased endurance, Decreased activity tolerance, Decreased strength, Difficulty walking, Decreased balance  Visit Diagnosis: Muscle weakness (generalized)  Difficulty in walking, not elsewhere classified      G-Codes - 2016-03-08 1155    Functional Assessment Tool Used 54minWT, 5xSTS, MMT, clinical  judgement   Functional Limitation Mobility: Walking and moving around   Mobility: Walking and Moving Around Current Status [phone]) At  least 40 percent but less than 60 percent impaired, limited or restricted   Mobility: Walking and Moving Around Goal Status [phone]) At least 20 percent but less than 40 percent impaired, limited or restricted       Problem List Patient Active Problem List   Diagnosis Date Noted  . AKI (acute kidney injury) ([place])   . Surgery, elective   . History of lumbar surgery   . Post-operative pain   . Thrombocytopenia ([place])   . Type 2 diabetes mellitus without complication, without long-term current use of insulin ([place])   . Lumbar spondylosis 02/02/2015  . 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 Status post lumbar surgery 09/29/2014  . DDD (degenerative disc disease), lumbar 07/02/2014  . Facet syndrome, lumbar 07/02/2014  . Sacroiliac joint dysfunction 07/02/2014  . Lumbar degenerative disc disease 12/24/2013    Blythe Stanford, PT DPT 02/24/2016, 11:57 AM  [place] MAIN [facility] SERVICES [address] [place], [place], [address] Phone: [phone]   Fax:  [phone]  Name: Courtney [name] MRN: [id number] Date of Birth: June 26, 1940

## 2016-03-23 ENCOUNTER — Ambulatory Visit: Payer: Medicare Other | Admitting: Physical Therapy

## 2016-03-23 ENCOUNTER — Ambulatory Visit: Payer: Medicare Other | Attending: Internal Medicine | Admitting: Physical Therapy

## 2016-03-23 VITALS — BP 132/78

## 2016-03-23 DIAGNOSIS — R262 Difficulty in walking, not elsewhere classified: Secondary | ICD-10-CM | POA: Diagnosis present

## 2016-03-23 DIAGNOSIS — M6281 Muscle weakness (generalized): Secondary | ICD-10-CM | POA: Insufficient documentation

## 2016-03-23 NOTE — Therapy (Signed)
[place] MAIN [facility] SERVICES [address] [place], [place], [address] Phone: [phone]   Fax:  [phone]  Physical Therapy Treatment  Patient Details  Name: Courtney [name] MRN: [id number] Date of Birth: March 27, 1940 Referring Provider: Emily Filbert MD  Encounter Date: 03/23/2016      PT End of Session - 03/23/16 1502    Visit Number 2   Number of Visits 24   Date for PT Re-Evaluation 05/17/16   Authorization Type 2/10 G Code   PT Start Time 1120   PT Stop Time 1155   PT Time Calculation (min) 35 min   Activity Tolerance Patient tolerated treatment well;No increased pain;Patient limited by fatigue   Behavior During Therapy [facility] for tasks assessed/performed      Past Medical History:  Diagnosis Date  . Arthritis    rheumatoid arthritis  . Cancer ([place])    skin cancer (squamous cell)  . Collagen vascular disease (HCC)    RA  . Dermatitis   . Diabetes mellitus without complication ([place])    type 2  . Eczema   . GERD (gastroesophageal reflux disease)   . Headache    migraines in the past (none since menopause)  . Hives    "chronic"  . Hypertension   . PONV (postoperative nausea and vomiting)   . Rosacea   . Sleep apnea 2004   does not use cpap  . Varicose veins     Past Surgical History:  Procedure Laterality Date  . BILATERAL CARPAL TUNNEL RELEASE    . BREAST CYST ASPIRATION Left    neg  . CHOLECYSTECTOMY  1963  . COLONOSCOPY WITH PROPOFOL N/A 12/31/2015   Procedure: COLONOSCOPY WITH PROPOFOL;  Surgeon: Lollie Sails, MD;  Location: [facility] ENDOSCOPY;  Service: Endoscopy;  Laterality: N/A;  . ENDOMETRIAL ABLATION  1991  . ESOPHAGOGASTRODUODENOSCOPY (EGD) WITH PROPOFOL N/A 12/31/2015   Procedure: ESOPHAGOGASTRODUODENOSCOPY (EGD) WITH PROPOFOL;  Surgeon: Lollie Sails, MD;  Location: [facility] ENDOSCOPY;  Service: Endoscopy;  Laterality: N/A;  . EYE SURGERY Bilateral 2006 and 2012   cataract surgery with lens implant   . eyelid surgery Bilateral 2014  . FOOT SURGERY Right    ligament and spurs  . LUMBAR LAMINECTOMY/DECOMPRESSION MICRODISCECTOMY Right 12/24/2013   Procedure: RIGHT LUMBAR THREE TO FOUR, LUMBAR FOUR TO FIVE, LUMBAR FIVE TO SACRAL ONE LAMINECTOMY/FORAMINOTOMY;  Surgeon: Floyce Stakes, MD;  Location: [facility] NEURO ORS;  Service: Neurosurgery;  Laterality: Right;  RIGHT L3-4 L4-5 L5-S1 LAMINECTOMY/FORAMINOTOMY  . NASAL SEPTUM SURGERY  2004  . SHOULDER SURGERY Right    for a frozen shoulder  . SKIN CANCER EXCISION Right    leg x 4  . TUBAL LIGATION  1968    Vitals:   03/23/16 1110 03/23/16 1126 03/23/16 1150  BP: (!) 150/70 130/70 132/78        Subjective Assessment - 03/23/16 1120    Subjective Pt reports her L hip, R foot is bothering more today. She is unsure how much RA is affecting this pain. Pt has not been sleeping well for past 5-6 weeks due to pain that innterrupts her sleep. The pain is related to hypersensitivity in the last 3 toes and it is hard to put weight on them when she gets up in the morning. As she walks on her feet more, she is able to put weight on them.  The sensitivity returns at night .  Aquatic PT inquired about her OSA Dx, and she reported she was  told she was on "the verge" of having sleep apnea 10 years ago. Pt was not given a CPAP machine.    Pertinent History Hx: (January 2017, December 2015) Back surgery, RA, diabetes   Limitations Lifting   How long can you stand comfortably? 18min   How long can you walk comfortably? 94min   Patient Stated Goals "to be able to do something without my legs giving out"   Pain Onset In the past 7 days        O: Pt entered/exited the pool via steps with UE support on rail.  50 ft =1 lap  Exercises performed in 3'6" depth   Stretches : (Cuing provided for proper alignment) ROM of each joint wall stretch hip flexor stretch on step  Mini squat 5 reps cued for alignment  Seated gentle ROM with hips, knees,  ankles Difficulty with toe /ankle circles R > L , required tactile cues  5'   2 laps Seated on noodles and one under armpit, sidestepping with toe tip L/R      Seated gentle ROM with arms, wrists, fingers   Seated relaxation on bench 4'                        PT Long Term Goals - 02/24/16 8891      PT LONG TERM GOAL #1   Title Patient will be independent with HEP to continue improvement from physical therapy after discharge.    Baseline dependent with form/technique   Time 12   Period Weeks   Status New     PT LONG TERM GOAL #2   Title Patient will improve 65min walk test to 1281ft to decrease fall risk and improve ability to ambulate at community distances   Baseline 1071ft   Time 12   Period Weeks   Status New     PT LONG TERM GOAL #3   Title Patient will improve 5xSTS to >16sec without use of hands to indicating a decrease in fall risk and to better able to stand after siting for prolonged periods of time.    Baseline 18sec with use of hands   Time 12   Period Weeks   Status New               Plan - 03/23/16 1502    Clinical Impression Statement Pt tolerated pool Tx without SOB, DOE and had no complaints. Introduced gentle ROM for all joints to manage RA and supported sidestepping to minimize load on her lumbopelvic spine. Pt demo'd difficulty with ankle circles on R > L and will require more joint mobility exercises.  Pt continues to benefit from low grade exercises at next session with skilled PT. Pt also reports she was told she was told almost 10 years ago that she on the "verge of sleep apnea" and was not given a CPAP machine. Pt may benefit from an updated sleep study.     Rehab Potential Fair   Clinical Impairments Affecting Rehab Potential (-) RA diagnosis, chronicity condition   PT Frequency 2x / week   PT Duration 12 weeks   PT Treatment/Interventions Electrical Stimulation;Cryotherapy;Aquatic Therapy;ADLs/Self Care Home  Management;Iontophoresis 4mg /ml Dexamethasone;Moist Heat;Ultrasound;Balance training;Therapeutic activities;Therapeutic exercise;Functional mobility training;Stair training;Gait training;Neuromuscular re-education;Patient/family education;Manual techniques   Consulted and Agree with Plan of Care Patient      Patient will benefit from skilled therapeutic intervention in order to improve the following deficits and impairments:  Abnormal gait, Pain, Improper body mechanics,  Decreased mobility, Decreased coordination, Decreased range of motion, Decreased endurance, Decreased activity tolerance, Decreased strength, Difficulty walking, Decreased balance  Visit Diagnosis: Muscle weakness (generalized)  Difficulty in walking, not elsewhere classified     Problem List Patient Active Problem List   Diagnosis Date Noted  . AKI (acute kidney injury) ([place])   . Surgery, elective   . History of lumbar surgery   . Post-operative pain   . Thrombocytopenia ([place])   . Type 2 diabetes mellitus without complication, without long-term current use of insulin ([place])   . Lumbar spondylosis 02/02/2015  . 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 Status post lumbar surgery 09/29/2014  . DDD (degenerative disc disease), lumbar 07/02/2014  . Facet syndrome, lumbar 07/02/2014  . Sacroiliac joint dysfunction 07/02/2014  . Lumbar degenerative disc disease 12/24/2013    Jerl Mina ,PT, DPT, E-RYT  03/23/2016, 3:10 PM  [place] MAIN [facility] SERVICES [address] [place], [place], [address] Phone: [phone]   Fax:  [phone]  Name: Courtney [name] MRN: [id number] Date of Birth: 02-Nov-1940

## 2016-03-30 ENCOUNTER — Ambulatory Visit: Payer: Medicare Other | Admitting: Physical Therapy

## 2016-03-30 DIAGNOSIS — R262 Difficulty in walking, not elsewhere classified: Secondary | ICD-10-CM

## 2016-03-30 DIAGNOSIS — M6281 Muscle weakness (generalized): Secondary | ICD-10-CM | POA: Diagnosis not present

## 2016-03-31 NOTE — Therapy (Signed)
[place] MAIN [facility] SERVICES [address] [place], [place], [address] Phone: [phone]   Fax:  [phone]  Physical Therapy Treatment  Patient Details  Name: Courtney [name] MRN: [id number] Date of Birth: April 06, 1940 Referring Provider: Emily Filbert MD  Encounter Date: 03/30/2016      PT End of Session - 03/31/16 1949    Visit Number 3   Number of Visits 24   Date for PT Re-Evaluation 05/17/16   Authorization Type 3/10 G Code   PT Start Time 1115   PT Stop Time 1148   PT Time Calculation (min) 33 min   Activity Tolerance Patient tolerated treatment well;No increased pain;Patient limited by fatigue   Behavior During Therapy [facility] for tasks assessed/performed      Past Medical History:  Diagnosis Date  . Arthritis    rheumatoid arthritis  . Cancer ([place])    skin cancer (squamous cell)  . Collagen vascular disease (HCC)    RA  . Dermatitis   . Diabetes mellitus without complication ([place])    type 2  . Eczema   . GERD (gastroesophageal reflux disease)   . Headache    migraines in the past (none since menopause)  . Hives    "chronic"  . Hypertension   . PONV (postoperative nausea and vomiting)   . Rosacea   . Sleep apnea 2004   does not use cpap  . Varicose veins     Past Surgical History:  Procedure Laterality Date  . BILATERAL CARPAL TUNNEL RELEASE    . BREAST CYST ASPIRATION Left    neg  . CHOLECYSTECTOMY  1963  . COLONOSCOPY WITH PROPOFOL N/A 12/31/2015   Procedure: COLONOSCOPY WITH PROPOFOL;  Surgeon: Lollie Sails, MD;  Location: [facility] ENDOSCOPY;  Service: Endoscopy;  Laterality: N/A;  . ENDOMETRIAL ABLATION  1991  . ESOPHAGOGASTRODUODENOSCOPY (EGD) WITH PROPOFOL N/A 12/31/2015   Procedure: ESOPHAGOGASTRODUODENOSCOPY (EGD) WITH PROPOFOL;  Surgeon: Lollie Sails, MD;  Location: [facility] ENDOSCOPY;  Service: Endoscopy;  Laterality: N/A;  . EYE SURGERY Bilateral 2006 and 2012   cataract surgery with lens implant   . eyelid surgery Bilateral 2014  . FOOT SURGERY Right    ligament and spurs  . LUMBAR LAMINECTOMY/DECOMPRESSION MICRODISCECTOMY Right 12/24/2013   Procedure: RIGHT LUMBAR THREE TO FOUR, LUMBAR FOUR TO FIVE, LUMBAR FIVE TO SACRAL ONE LAMINECTOMY/FORAMINOTOMY;  Surgeon: Floyce Stakes, MD;  Location: [facility] NEURO ORS;  Service: Neurosurgery;  Laterality: Right;  RIGHT L3-4 L4-5 L5-S1 LAMINECTOMY/FORAMINOTOMY  . NASAL SEPTUM SURGERY  2004  . SHOULDER SURGERY Right    for a frozen shoulder  . SKIN CANCER EXCISION Right    leg x 4  . TUBAL LIGATION  1968    There were no vitals filed for this visit.      Subjective Assessment - 03/31/16 1942    Subjective Pt reports her L hip, R foot is bothering more today. She is unsure how much RA is affecting this pain. Pt has not been sleeping well for past 5-6 weeks due to pain that innterrupts her sleep. The pain is related to hypersensitivity in the last 3 toes and it is hard to put weight on them when she gets up in the morning. As she walks on her feet more, she is able to put weight on them.  The sensitivity returns at night .  Aquatic PT inquired about her OSA Dx, and she reported she was told she was on "the verge" of having sleep  apnea 10 years ago. Pt was not given a CPAP machine.    Pertinent History Hx: (January 2017, December 2015) Back surgery, RA, diabetes   Limitations Lifting   How long can you stand comfortably? 62min   How long can you walk comfortably? 49min   Patient Stated Goals "to be able to do something without my legs giving out"   Pain Onset In the past 7 days                     Adult Aquatic Therapy - 03/31/16 1943      Aquatic Therapy Subjective   Subjective Pt had no increased pain with the sessions today           O: Pt entered/exited the pool via steps with UE support on rail.  50 ft =1 lap  Exercises performed in 3'6" depth   Stretches : (Cuing provided for proper alignment) ROM of each  joint wall stretch hip flexor stretch on step  Mini squat 10 reps cued for alignment  Seated gentle ROM with hips, knees, ankles Difficulty with toe /ankle circles R > L , required tactile cues  5'   4  laps Seated on noodles and one under armpit, sidestepping with toe tip L/R      Seated gentle ROM with arms, wrists, fingers, shoulder ER. Retraction    Seated relaxation on bench 4'                     PT Long Term Goals - 02/24/16 0957      PT LONG TERM GOAL #1   Title Patient will be independent with HEP to continue improvement from physical therapy after discharge.    Baseline dependent with form/technique   Time 12   Period Weeks   Status New     PT LONG TERM GOAL #2   Title Patient will improve 57min walk test to 1254ft to decrease fall risk and improve ability to ambulate at community distances   Baseline 1026ft   Time 12   Period Weeks   Status New     PT LONG TERM GOAL #3   Title Patient will improve 5xSTS to >16sec without use of hands to indicating a decrease in fall risk and to better able to stand after siting for prolonged periods of time.    Baseline 18sec with use of hands   Time 12   Period Weeks   Status New               Plan - 03/31/16 1949    Clinical Impression Statement Pt demo'd increased endurance with seated sidestepping  Exercise today. Continued to address more upright posture with shoulder retraction. Depression exercises in addition to gentle ROM for joint relief. Pt continues to benefit from skilled PT.    Rehab Potential Fair   Clinical Impairments Affecting Rehab Potential (-) RA diagnosis, chronicity condition   PT Frequency 2x / week   PT Duration 12 weeks   PT Treatment/Interventions Electrical Stimulation;Cryotherapy;Aquatic Therapy;ADLs/Self Care Home Management;Iontophoresis 4mg /ml Dexamethasone;Moist Heat;Ultrasound;Balance training;Therapeutic activities;Therapeutic exercise;Functional mobility  training;Stair training;Gait training;Neuromuscular re-education;Patient/family education;Manual techniques   Consulted and Agree with Plan of Care Patient      Patient will benefit from skilled therapeutic intervention in order to improve the following deficits and impairments:  Abnormal gait, Pain, Improper body mechanics, Decreased mobility, Decreased coordination, Decreased range of motion, Decreased endurance, Decreased activity tolerance, Decreased strength, Difficulty walking, Decreased balance  Visit  Diagnosis: Muscle weakness (generalized)  Difficulty in walking, not elsewhere classified     Problem List Patient Active Problem List   Diagnosis Date Noted  . AKI (acute kidney injury) ([place])   . Surgery, elective   . History of lumbar surgery   . Post-operative pain   . Thrombocytopenia ([place])   . Type 2 diabetes mellitus without complication, without long-term current use of insulin ([place])   . Lumbar spondylosis 02/02/2015  . 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 Status post lumbar surgery 09/29/2014  . DDD (degenerative disc disease), lumbar 07/02/2014  . Facet syndrome, lumbar 07/02/2014  . Sacroiliac joint dysfunction 07/02/2014  . Lumbar degenerative disc disease 12/24/2013    Jerl Mina 03/31/2016, 8:02 PM  [place] MAIN [facility] SERVICES [address] [place], [place], [address] Phone: [phone]   Fax:  [phone]  Name: Courtney [name] MRN: [id number] Date of Birth: 1940/09/28

## 2016-04-06 ENCOUNTER — Ambulatory Visit: Payer: Medicare Other | Admitting: Physical Therapy

## 2016-04-06 DIAGNOSIS — R262 Difficulty in walking, not elsewhere classified: Secondary | ICD-10-CM

## 2016-04-06 DIAGNOSIS — M6281 Muscle weakness (generalized): Secondary | ICD-10-CM

## 2016-04-06 NOTE — Therapy (Signed)
[place] MAIN [facility] SERVICES [address] [place], [place], [address] Phone: [phone]   Fax:  [phone]  Physical Therapy Treatment  Patient Details  Name: Courtney [name] MRN: [id number] Date of Birth: 09-Aug-1940 Referring Provider: Emily Filbert MD  Encounter Date: 04/06/2016      PT End of Session - 04/06/16 1020    Visit Number 4   Number of Visits 24   Date for PT Re-Evaluation 05/17/16   Authorization Type 4/10 G Code   PT Start Time 1010   PT Stop Time 1045   PT Time Calculation (min) 35 min      Past Medical History:  Diagnosis Date  . Arthritis    rheumatoid arthritis  . Cancer ([place])    skin cancer (squamous cell)  . Collagen vascular disease (HCC)    RA  . Dermatitis   . Diabetes mellitus without complication ([place])    type 2  . Eczema   . GERD (gastroesophageal reflux disease)   . Headache    migraines in the past (none since menopause)  . Hives    "chronic"  . Hypertension   . PONV (postoperative nausea and vomiting)   . Rosacea   . Sleep apnea 2004   does not use cpap  . Varicose veins     Past Surgical History:  Procedure Laterality Date  . BILATERAL CARPAL TUNNEL RELEASE    . BREAST CYST ASPIRATION Left    neg  . CHOLECYSTECTOMY  1963  . COLONOSCOPY WITH PROPOFOL N/A 12/31/2015   Procedure: COLONOSCOPY WITH PROPOFOL;  Surgeon: Lollie Sails, MD;  Location: [facility] ENDOSCOPY;  Service: Endoscopy;  Laterality: N/A;  . ENDOMETRIAL ABLATION  1991  . ESOPHAGOGASTRODUODENOSCOPY (EGD) WITH PROPOFOL N/A 12/31/2015   Procedure: ESOPHAGOGASTRODUODENOSCOPY (EGD) WITH PROPOFOL;  Surgeon: Lollie Sails, MD;  Location: [facility] ENDOSCOPY;  Service: Endoscopy;  Laterality: N/A;  . EYE SURGERY Bilateral 2006 and 2012   cataract surgery with lens implant  . eyelid surgery Bilateral 2014  . FOOT SURGERY Right    ligament and spurs  . LUMBAR LAMINECTOMY/DECOMPRESSION MICRODISCECTOMY Right 12/24/2013   Procedure: RIGHT LUMBAR THREE TO FOUR, LUMBAR FOUR TO FIVE, LUMBAR FIVE TO SACRAL ONE LAMINECTOMY/FORAMINOTOMY;  Surgeon: Floyce Stakes, MD;  Location: [facility] NEURO ORS;  Service: Neurosurgery;  Laterality: Right;  RIGHT L3-4 L4-5 L5-S1 LAMINECTOMY/FORAMINOTOMY  . NASAL SEPTUM SURGERY  2004  . SHOULDER SURGERY Right    for a frozen shoulder  . SKIN CANCER EXCISION Right    leg x 4  . TUBAL LIGATION  1968    There were no vitals filed for this visit.      Subjective Assessment - 04/06/16 1019    Subjective Pt reported her low back pain is almost not there anymore.    Pertinent History Hx: (January 2017, December 2015) Back surgery, RA, diabetes   Limitations Lifting   How long can you stand comfortably? 88mn   How long can you walk comfortably? 132m   Patient Stated Goals "to be able to do something without my legs giving out"   Pain Onset In the past 7 days                     Adult Aquatic Therapy - 04/06/16 1020      Aquatic Therapy Subjective   Subjective Pt had no increased pain with the session today         Aquatic Therapy Subjective   Subjective  Pt had no increased pain with the sessions today        O: Pt entered/exited the pool via ramp with UE support on rail with cuing for feet placement , wider BOS.  50 ft =1 lap  Exercises performed in 3'6" depth   Stretches : (Cuing provided for proper alignment) ROM of each joint    Seated gentle ROM with hips, knees, ankles Less difficulty with toe /ankle circles R > L , required tactile cues  5'   6 laps ( 1=lap 10 ft 3'6 ft to 4'0" ft)  sidestepping  L/R , BUE on wall rail. Cued for upright posture  Floating on noodles: 2' hand paddling  Seated on bench: chest rows with noodles 10 reps, pushing noodle under water  10 reps, and shoulder rectration 5 sec, 10 reps      Mini squat 10 reps cued for alignment of knees, pushing noodle under water  Seated gentle ROM with arms, wrists,  fingers, shoulder ER. Retraction    Seated relaxation on bench 5'   wall stretch hip flexor stretch on step                       PT Long Term Goals - 02/24/16 0957      PT LONG TERM GOAL #1   Title Patient will be independent with HEP to continue improvement from physical therapy after discharge.    Baseline dependent with form/technique   Time 12   Period Weeks   Status Partially Met     PT LONG TERM GOAL #2   Title Patient will improve 62mn walk test to 12039fto decrease fall risk and improve ability to ambulate at community distances   Baseline 102057f Time 12   Period Weeks   Status On going     PT LONG TERM GOAL #3   Title Patient will improve 5xSTS to >16sec without use of hands to indicating a decrease in fall risk and to better able to stand after siting for prolonged periods of time.    Baseline 18sec with use of hands   Time 12   Period Weeks   Status On going               Plan - 04/06/16 1021    Clinical Impression Statement Pt is making great progress with report of minimal low back pain. Pt states she has been practicing gentle ROM HEP at home and trying to work on her upright posture. Pt demo'd increased back extension with less cuing. Progressed pt to upright side stepping exercise to strengthen hips and added more resistance to BUE exercises to progress thoracolumbar strengthening. Pt demo'd correctly. Pt continues to benefit from skilled PT.    Rehab Potential Fair   Clinical Impairments Affecting Rehab Potential (-) RA diagnosis, chronicity condition   PT Frequency 2x / week   PT Duration 12 weeks   PT Treatment/Interventions Electrical Stimulation;Cryotherapy;Aquatic Therapy;ADLs/Self Care Home Management;Iontophoresis 4mg53m Dexamethasone;Moist Heat;Ultrasound;Balance training;Therapeutic activities;Therapeutic exercise;Functional mobility training;Stair training;Gait training;Neuromuscular re-education;Patient/family  education;Manual techniques   Consulted and Agree with Plan of Care Patient      Patient will benefit from skilled therapeutic intervention in order to improve the following deficits and impairments:  Abnormal gait, Pain, Improper body mechanics, Decreased mobility, Decreased coordination, Decreased range of motion, Decreased endurance, Decreased activity tolerance, Decreased strength, Difficulty walking, Decreased balance  Visit Diagnosis: Muscle weakness (generalized)  Difficulty in walking, not elsewhere classified  Problem List Patient Active Problem List   Diagnosis Date Noted  . AKI (acute kidney injury) ([place])   . Surgery, elective   . History of lumbar surgery   . Post-operative pain   . Thrombocytopenia ([place])   . Type 2 diabetes mellitus without complication, without long-term current use of insulin ([place])   . Lumbar spondylosis 02/02/2015  . 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 Status post lumbar surgery 09/29/2014  . DDD (degenerative disc disease), lumbar 07/02/2014  . Facet syndrome, lumbar 07/02/2014  . Sacroiliac joint dysfunction 07/02/2014  . Lumbar degenerative disc disease 12/24/2013    Jerl Mina ,PT, DPT, E-RYT  04/06/2016, 10:40 AM  [place] MAIN [facility] SERVICES [address] [place], [place], [address] Phone: [phone]   Fax:  [phone]  Name: Courtney [name] MRN: [id number] Date of Birth: 1940/11/16

## 2016-04-11 ENCOUNTER — Ambulatory Visit: Payer: Medicare Other | Attending: Internal Medicine | Admitting: Physical Therapy

## 2016-04-11 DIAGNOSIS — R262 Difficulty in walking, not elsewhere classified: Secondary | ICD-10-CM | POA: Insufficient documentation

## 2016-04-11 DIAGNOSIS — M6281 Muscle weakness (generalized): Secondary | ICD-10-CM | POA: Insufficient documentation

## 2016-04-12 NOTE — Therapy (Signed)
[place] MAIN [facility] SERVICES [address] [place], [place], [address] Phone: [phone]   Fax:  [phone]  Physical Therapy Treatment  Patient Details  Name: Courtney [name] MRN: [id number] Date of Birth: Oct 23, 1940 Referring Provider: Emily Filbert MD  Encounter Date: 04/11/2016      PT End of Session - 04/12/16 1234    Visit Number 5   Number of Visits 24   Date for PT Re-Evaluation 05/17/16   Authorization Type 5/10 G Code   PT Start Time 2505   PT Stop Time 1110   PT Time Calculation (min) 35 min   Activity Tolerance Patient tolerated treatment well;No increased pain;Patient limited by fatigue   Behavior During Therapy [facility] for tasks assessed/performed      Past Medical History:  Diagnosis Date  . Arthritis    rheumatoid arthritis  . Cancer ([place])    skin cancer (squamous cell)  . Collagen vascular disease (HCC)    RA  . Dermatitis   . Diabetes mellitus without complication ([place])    type 2  . Eczema   . GERD (gastroesophageal reflux disease)   . Headache    migraines in the past (none since menopause)  . Hives    "chronic"  . Hypertension   . PONV (postoperative nausea and vomiting)   . Rosacea   . Sleep apnea 2004   does not use cpap  . Varicose veins     Past Surgical History:  Procedure Laterality Date  . BILATERAL CARPAL TUNNEL RELEASE    . BREAST CYST ASPIRATION Left    neg  . CHOLECYSTECTOMY  1963  . COLONOSCOPY WITH PROPOFOL N/A 12/31/2015   Procedure: COLONOSCOPY WITH PROPOFOL;  Surgeon: Lollie Sails, MD;  Location: [facility] ENDOSCOPY;  Service: Endoscopy;  Laterality: N/A;  . ENDOMETRIAL ABLATION  1991  . ESOPHAGOGASTRODUODENOSCOPY (EGD) WITH PROPOFOL N/A 12/31/2015   Procedure: ESOPHAGOGASTRODUODENOSCOPY (EGD) WITH PROPOFOL;  Surgeon: Lollie Sails, MD;  Location: [facility] ENDOSCOPY;  Service: Endoscopy;  Laterality: N/A;  . EYE SURGERY Bilateral 2006 and 2012   cataract surgery with lens implant  .  eyelid surgery Bilateral 2014  . FOOT SURGERY Right    ligament and spurs  . LUMBAR LAMINECTOMY/DECOMPRESSION MICRODISCECTOMY Right 12/24/2013   Procedure: RIGHT LUMBAR THREE TO FOUR, LUMBAR FOUR TO FIVE, LUMBAR FIVE TO SACRAL ONE LAMINECTOMY/FORAMINOTOMY;  Surgeon: Floyce Stakes, MD;  Location: [facility] NEURO ORS;  Service: Neurosurgery;  Laterality: Right;  RIGHT L3-4 L4-5 L5-S1 LAMINECTOMY/FORAMINOTOMY  . NASAL SEPTUM SURGERY  2004  . SHOULDER SURGERY Right    for a frozen shoulder  . SKIN CANCER EXCISION Right    leg x 4  . TUBAL LIGATION  1968    There were no vitals filed for this visit.      Subjective Assessment - 04/12/16 1234    Subjective Pt reported her low back is not bothering her anymore. Pt received a cortisone shots in her R foot for tendinopathy and was told to not bear weight on it.    Pertinent History Hx: (January 2017, December 2015) Back surgery, RA, diabetes   Limitations Lifting   How long can you stand comfortably? 78mn   How long can you walk comfortably? 132m   Patient Stated Goals "to be able to do something without my legs giving out"   Pain Onset In the past 7 days  Adult Aquatic Therapy - 04/12/16 1234      Aquatic Therapy Subjective   Subjective Pt had no increased pain with the sessions today         O: Pt entered via WC pushed by PT, exited pool via WC pushed by PT while using BUE support on rail to assist PT up the ramp.  50 ft =1 lap  Seated gentle ROM with hips, knees, ankles, toe /ankle circles  Chest rows with noodle on bench 10 reps x 3, cued for less upper trap overuse Shoulder retraction/ ER 10 reps  Lateral scoots along bench with cue for lower trap activation  45f x 2 . L/R  20 reps, LAQ, hip abd/add each  Seated relaxation on bench 4'                   PT Long Term Goals - 04/12/16 1239      PT LONG TERM GOAL #1   Title Patient will be independent with HEP to continue  improvement from physical therapy after discharge.    Baseline dependent with form/technique   Time 12   Period Weeks   Status Partially Met     PT LONG TERM GOAL #2   Title Patient will improve 686m walk test to 120070fo decrease fall risk and improve ability to ambulate at community distances   Baseline 1020f67fTime 12   Period Weeks   Status On-going     PT LONG TERM GOAL #3   Title Patient will improve 5xSTS to >16sec without use of hands to indicating a decrease in fall risk and to better able to stand after siting for prolonged periods of time.    Baseline 18sec with use of hands   Time 12   Period Weeks   Status On-going               Plan - 04/12/16 1235    Clinical Impression Statement Pt continues to report decreased LBP and maintains gentle ROM for HEP for her joints. Pt's session was modified due to pt's report of her MD restricting her weight bearing on her R heel 2/2 tendinopathy. Pt was assisted in/out pool via WC and pt made safe t/f with no WBing on her R heel from WC<> chair. Withheld standing / WBing exercises. Performed only seated exercises. Pt demo'd increased thoracolumbar/latissimiss/ tricep strength with seated lateral scoots.  Pt plans to visit [employer] to look at the pool for maintenance of HEP and use of water therapy for joint pain relief.  Pt continues to benefit from skilled PT.      Rehab Potential Fair   Clinical Impairments Affecting Rehab Potential (-) RA diagnosis, chronicity condition   PT Frequency 2x / week   PT Duration 12 weeks   PT Treatment/Interventions Electrical Stimulation;Cryotherapy;Aquatic Therapy;ADLs/Self Care Home Management;Iontophoresis 4mg/51mDexamethasone;Moist Heat;Ultrasound;Balance training;Therapeutic activities;Therapeutic exercise;Functional mobility training;Stair training;Gait training;Neuromuscular re-education;Patient/family education;Manual techniques   Consulted and Agree with Plan of Care Patient       Patient will benefit from skilled therapeutic intervention in order to improve the following deficits and impairments:  Abnormal gait, Pain, Improper body mechanics, Decreased mobility, Decreased coordination, Decreased range of motion, Decreased endurance, Decreased activity tolerance, Decreased strength, Difficulty walking, Decreased balance  Visit Diagnosis: Muscle weakness (generalized)  Difficulty in walking, not elsewhere classified     Problem List Patient Active Problem List   Diagnosis Date Noted  . AKI (acute kidney injury) (HCC) [place] [facility],  elective   . History of lumbar surgery   . Post-operative pain   . Thrombocytopenia ([place])   . Type 2 diabetes mellitus without complication, without long-term current use of insulin ([place])   . Lumbar spondylosis 02/02/2015  . 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 Status post lumbar surgery 09/29/2014  . DDD (degenerative disc disease), lumbar 07/02/2014  . Facet syndrome, lumbar 07/02/2014  . Sacroiliac joint dysfunction 07/02/2014  . Lumbar degenerative disc disease 12/24/2013    Jerl Mina  ,PT, DPT, E-RYT  04/12/2016, 12:39 PM  [place] MAIN [facility] SERVICES [address] [place], [place], [address] Phone: [phone]   Fax:  [phone]  Name: VEGAS COFFIN MRN: [id number] Date of Birth: 06/12/1940

## 2016-04-20 ENCOUNTER — Ambulatory Visit: Payer: Medicare Other | Admitting: Physical Therapy

## 2016-04-27 ENCOUNTER — Ambulatory Visit: Payer: Medicare Other | Admitting: Physical Therapy

## 2016-04-27 DIAGNOSIS — M6281 Muscle weakness (generalized): Secondary | ICD-10-CM

## 2016-04-27 DIAGNOSIS — R262 Difficulty in walking, not elsewhere classified: Secondary | ICD-10-CM

## 2016-04-28 NOTE — Therapy (Signed)
[facility] MA[facility] REHAB SERVICES [address] [place], [place], [address] Phone: [phone]   Fax:  [phone]  Physical Therapy Treatment  Patient Details  Name: Courtney [name] MRN: [id number] Date of Birth: 03/23/1940 Referring Provider: Mark Miller MD  Encounter Date: 04/27/2016      PT End of Session - 04/28/16 1517    Visit Number 6   Number of Visits 24   Date for PT Re-Evaluation 05/17/16   Authorization Type 6/10 G Code   PT Start Time 1115   PT Stop Time 1200   PT Time Calculation (min) 45 min   Activity Tolerance Patient tolerated treatment well;No increased pain;Patient limited by fatigue   Behavior During Therapy WFL for tasks assessed/performed      Past Medical History:  Diagnosis Date  . Arthritis    rheumatoid arthritis  . Cancer (HCC)    skin cancer (squamous cell)  . Collagen vascular disease (HCC)    RA  . Dermatitis   . Diabetes mellitus without complication (HCC)    type 2  . Eczema   . GERD (gastroesophageal reflux disease)   . Headache    migraines in the past (none since menopause)  . Hives    "chronic"  . Hypertension   . PONV (postoperative nausea and vomiting)   . Rosacea   . Sleep apnea 2004   does not use cpap  . Varicose veins     Past Surgical History:  Procedure Laterality Date  . BILATERAL CARPAL TUNNEL RELEASE    . BREAST CYST ASPIRATION Left    neg  . CHOLECYSTECTOMY  1963  . COLONOSCOPY WITH PROPOFOL N/A 12/31/2015   Procedure: COLONOSCOPY WITH PROPOFOL;  Surgeon: Martin U Skulskie, MD;  Location: [facility] ENDOSCOPY;  Service: Endoscopy;  Laterality: N/A;  . ENDOMETRIAL ABLATION  1991  . ESOPHAGOGASTRODUODENOSCOPY (EGD) WITH PROPOFOL N/A 12/31/2015   Procedure: ESOPHAGOGASTRODUODENOSCOPY (EGD) WITH PROPOFOL;  Surgeon: Martin U Skulskie, MD;  Location: [facility] ENDOSCOPY;  Service: Endoscopy;  Laterality: N/A;  . EYE SURGERY Bilateral 2006 and 2012   cataract surgery with lens implant   . eyelid surgery Bilateral 2014  . FOOT SURGERY Right    ligament and spurs  . LUMBAR LAMINECTOMY/DECOMPRESSION MICRODISCECTOMY Right 12/24/2013   Procedure: RIGHT LUMBAR THREE TO FOUR, LUMBAR FOUR TO FIVE, LUMBAR FIVE TO SACRAL ONE LAMINECTOMY/FORAMINOTOMY;  Surgeon: Ernesto M Botero, MD;  Location: [facility];  Service: Neurosurgery;  Laterality: Right;  RIGHT L3-4 L4-5 L5-S1 LAMINECTOMY/FORAMINOTOMY  . NASAL SEPTUM SURGERY  2004  . SHOULDER SURGERY Right    for a frozen shoulder  . SKIN CANCER EXCISION Right    leg x 4  . TUBAL LIGATION  1968    There were no vitals filed for this visit.      Subjective Assessment - 04/28/16 1517    Subjective Pt reported she is feeling better about exercising in the pool and plans to go to [employer]. Pt is ready to make this session her last session. Pt needs to review the stretches.    Pertinent History Hx: (January 2017, December 2015) Back surgery, RA, diabetes   Limitations Lifting   How long can you stand comfortably? 10min   How long can you walk comfortably? 10min   Patient Stated Goals "to be able to do something without my legs giving out"   Pain Onset In the past 7 days           Subjective Assessment - 04/12/16   1234    Subjective Pt reported her low back is not bothering her anymore. Pt received a cortisone shots in her R foot for tendinopathy and was told to not bear weight on it.    Pertinent History Hx: (January 2017, December 2015) Back surgery, RA, diabetes   Limitations Lifting   How long can you stand comfortably? 10min   How long can you walk comfortably? 10min   Patient Stated Goals "to be able to do something without my legs giving out"   Pain Onset In the past 7 days                   Adult Aquatic Therapy - 04/12/16 1234            Aquatic Therapy Subjective   Subjective Pt had no increased pain with the sessions today      O: Pt entered via WC pushed by PT, exited pool via WC  pushed by PT while using BUE support on rail to assist PT up the ramp.  50 ft =1 lap   Seated gentle ROM with hips, knees, ankles, toe /ankle circles   2 laps walking forward with 2 noodles  Chest rows with noodle on bench 10 reps x 3, cued for less upper trap overuse Shoulder retraction/ ER 10 reps  Lateral scoots along bench with cue for lower trap activation  5ft x 2 . L/R  20 reps, LAQ, hip abd/add each   4 laps walking backwards with noodles  Seated gentle ROM with hips, knees, ankles, toe /ankle circles  Seated relaxation on bench 4'                 PT Long Term Goals - 04/12/16 1239            PT LONG TERM GOAL #1   Title Patient will be independent with HEP to continue improvement from physical therapy after discharge.    Baseline dependent with form/technique   Time 12   Period Weeks   Status Partially Met       PT LONG TERM GOAL #2   Title Patient will improve 6min walk test to 1200ft to decrease fall risk and improve ability to ambulate at community distances   Baseline 1020ft   Time 12   Period Weeks   Status On-going       PT LONG TERM GOAL #3   Title Patient will improve 5xSTS to >16sec without use of hands to indicating a decrease in fall risk and to better able to stand after siting for prolonged periods of time.    Baseline 18sec with use of hands   Time 12   Period Weeks   Status On-going                  Plan - 04/26/16 1235    Clinical Impression Statement Pt continues to report decreased LBP and maintains gentle ROM for HEP for her joints. Pt's session focused on reviewing her routine which demo'd properly with cues.  Pt has increased endurance with walking laps. Pt demo'd increased thoracolumbar/latissimiss/ tricep strength with seated lateral scoots.  Pt plans to visit [employer] to look at the pool for maintenance of HEP and use of water therapy for joint pain relief. Pt will be ready for d/c after  reassessment of her goals at her next visit. PT will mail pt a sheet of her HEP per pt's request.    Rehab Potential Fair     Clinical Impairments Affecting Rehab Potential (-) RA diagnosis, chronicity condition   PT Frequency 2x / week   PT Duration 12 weeks   PT Treatment/Interventions Electrical Stimulation;Cryotherapy;Aquatic Therapy;ADLs/Self Care Home Management;Iontophoresis 4mg/ml Dexamethasone;Moist Heat;Ultrasound;Balance training;Therapeutic activities;Therapeutic exercise;Functional mobility training;Stair training;Gait training;Neuromuscular re-education;Patient/family education;Manual techniques   Consulted and Agree with Plan of Care Patient      Patient will benefit from skilled therapeutic intervention in order to improve the following deficits and impairments:  Abnormal gait, Pain, Improper body mechanics, Decreased mobility, Decreased coordination, Decreased range of motion, Decreased endurance, Decreased activity tolerance, Decreased strength, Difficulty walking, Decreased balance  Visit Diagnosis: Muscle weakness (generalized)  Difficulty in walking, not elsewhere classified    Problem List     Patient Active Problem List   Diagnosis Date Noted  . AKI (acute kidney injury) (HCC)   . Surgery, elective   . History of lumbar surgery   . Post-operative pain   . Thrombocytopenia (HCC)   . Type 2 diabetes mellitus without complication, without long-term current use of insulin (HCC)   . Lumbar spondylosis 02/02/2015  . 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 Status post lumbar surgery 09/29/2014  . DDD (degenerative disc disease), lumbar 07/02/2014  . Facet syndrome, lumbar 07/02/2014  . Sacroiliac joint dysfunction 07/02/2014  . Lumbar degenerative disc disease 12/24/2013    ,Shin Yiing  ,PT, DPT, E-RYT   04/28/2016, 3:23 PM  [facility] [address] [place], [place], [address] Phone: [phone]   Fax:  [phone]  Name: Ryleah A Lehr MRN: [id number] Date of Birth: 08/10/1940   

## 2016-05-02 ENCOUNTER — Ambulatory Visit: Payer: Medicare Other | Admitting: Physical Therapy

## 2016-05-02 ENCOUNTER — Encounter: Payer: Self-pay | Admitting: Physical Therapy

## 2016-05-02 DIAGNOSIS — M6281 Muscle weakness (generalized): Secondary | ICD-10-CM

## 2016-05-02 DIAGNOSIS — R262 Difficulty in walking, not elsewhere classified: Secondary | ICD-10-CM

## 2016-05-02 NOTE — Therapy (Signed)
[place] MAIN [facility] SERVICES [address] [place], [place], [address] Phone: [phone]   Fax:  [phone]  Physical Therapy Treatment/Discharge Summary  Patient Details  Name: Courtney [name] MRN: [id number] Date of Birth: 24-Jul-1940 Referring Provider: Emily Filbert MD  Encounter Date: 05/02/2016      [name] End of Session - 05/02/16 1131    Visit Number 7   Number of Visits 24   Date for [name] Re-Evaluation 05/17/16   Authorization Type 7/10 G Code   [name] Start Time 1115   [name] Stop Time 1145   [name] Time Calculation (min) 30 min   Equipment Utilized During Treatment Gait belt   Activity Tolerance Patient tolerated treatment well;No increased pain;Patient limited by fatigue   Behavior During Therapy [facility] for tasks assessed/performed      Past Medical History:  Diagnosis Date  . Arthritis    rheumatoid arthritis  . Cancer ([place])    skin cancer (squamous cell)  . Collagen vascular disease (HCC)    RA  . Dermatitis   . Diabetes mellitus without complication ([place])    type 2  . Eczema   . GERD (gastroesophageal reflux disease)   . Headache    migraines in the past (none since menopause)  . Hives    "chronic"  . Hypertension   . PONV (postoperative nausea and vomiting)   . Rosacea   . Sleep apnea 2004   does not use cpap  . Varicose veins     Past Surgical History:  Procedure Laterality Date  . BILATERAL CARPAL TUNNEL RELEASE    . BREAST CYST ASPIRATION Left    neg  . CHOLECYSTECTOMY  1963  . COLONOSCOPY WITH PROPOFOL N/A 12/31/2015   Procedure: COLONOSCOPY WITH PROPOFOL;  Surgeon: Lollie Sails, MD;  Location: [facility] ENDOSCOPY;  Service: Endoscopy;  Laterality: N/A;  . ENDOMETRIAL ABLATION  1991  . ESOPHAGOGASTRODUODENOSCOPY (EGD) WITH PROPOFOL N/A 12/31/2015   Procedure: ESOPHAGOGASTRODUODENOSCOPY (EGD) WITH PROPOFOL;  Surgeon: Lollie Sails, MD;  Location: [facility] ENDOSCOPY;  Service: Endoscopy;  Laterality: N/A;  . EYE  SURGERY Bilateral 2006 and 2012   cataract surgery with lens implant  . eyelid surgery Bilateral 2014  . FOOT SURGERY Right    ligament and spurs  . LUMBAR LAMINECTOMY/DECOMPRESSION MICRODISCECTOMY Right 12/24/2013   Procedure: RIGHT LUMBAR THREE TO FOUR, LUMBAR FOUR TO FIVE, LUMBAR FIVE TO SACRAL ONE LAMINECTOMY/FORAMINOTOMY;  Surgeon: Floyce Stakes, MD;  Location: [facility];  Service: Neurosurgery;  Laterality: Right;  RIGHT L3-4 L4-5 L5-S1 LAMINECTOMY/FORAMINOTOMY  . NASAL SEPTUM SURGERY  2004  . SHOULDER SURGERY Right    for a frozen shoulder  . SKIN CANCER EXCISION Right    leg x 4  . TUBAL LIGATION  1968    There were no vitals filed for this visit.      Subjective Assessment - 05/02/16 1130    Subjective Patient reports doing well; She reports feeling comfortable with the pool exercise. She is having some left hip pain which she reports is only present when she is bearing weight on it.    Pertinent History Hx: (January 2017, December 2015) Back surgery, RA, diabetes   Limitations Lifting   How long can you stand comfortably? 43mn   How long can you walk comfortably? 142m   Patient Stated Goals "to be able to do something without my legs giving out"   Currently in Pain? Yes   Pain Score 2    Pain Location  Hip   Pain Orientation Left   Pain Descriptors / Indicators Aching   Pain Type Chronic pain   Pain Onset In the past 7 days   Pain Frequency Intermittent   Aggravating Factors  standing, walking   Pain Relieving Factors rest,   Effect of Pain on Daily Activities decreased standing tolerance;    Multiple Pain Sites No            [facility] [name] Assessment - 05/02/16 0001      6 minute walk test results    Aerobic Endurance Distance Walked 1120   Endurance additional comments without AD, forward flexed posture, step through pattern; required standing rest break after 3 min for about 15 sec;     Standardized Balance Assessment   Five times sit to stand comments   13.5 sec without pushing on chair (<15 sec indicates low fall risk) improved from 02/23/16 which was 18 sec with UE use)   10 Meter Walk 1.17 m/s without AD (low fall risk, community ambulator improved from 02/23/16 which was 0.8 m/s)       TREATMENT:  Instructed patient in 6 min walk, 10 meter walk, 5 times sit<>stand etc to address goals; See above; She required min Vcs and close supervision for safety with outcome measures;  Reinforced HEP with instruction to continue using counter for standing balance exercise for safety; Patient verbalized understanding in HEP and expressed independence in pool exercise. She will be using her brother's pool and/or [employer] in [place] for pool exercise.   Patient is appropriate for discharge at this time;                      [name] Education - 05/02/16 1131    Education provided Yes   Education Details HEP reinforced, progress towards goals;    Person(s) Educated Patient   Methods Explanation;Verbal cues   Comprehension Verbalized understanding;Returned demonstration;Verbal cues required             [name] Long Term Goals - 05/02/16 1132      [name] LONG TERM GOAL #1   Title Patient will be independent with HEP to continue improvement from physical therapy after discharge.    Baseline dependent with form/technique   Time 12   Period Weeks   Status Achieved     [name] LONG TERM GOAL #2   Title Patient will improve 72mn walk test to 12085fto decrease fall risk and improve ability to ambulate at community distances   Baseline 102088f Time 12   Period Weeks   Status Partially Met     [name] LONG TERM GOAL #3   Title Patient will improve 5xSTS to >16sec without use of hands to indicating a decrease in fall risk and to better able to stand after siting for prolonged periods of time.    Baseline 18sec with use of hands   Time 12   Period Weeks   Status Achieved               Plan - 05/02/16 1158    Clinical Impression  Statement Patient reports doing well; She expressed understanding and independence in pool exercise. [name] instructed patient in outcome measures to assess progress towards goals. She has met most goals. She expressed desire to stop [name] at this time so that she can start exercising on her own at the pool. She will be discharged today;    Rehab Potential Fair   Clinical Impairments Affecting Rehab  Potential (-) RA diagnosis, chronicity condition   [name] Frequency 2x / week   [name] Duration 12 weeks   [name] Treatment/Interventions Electrical Stimulation;Cryotherapy;Aquatic Therapy;ADLs/Self Care Home Management;Iontophoresis 29m/ml Dexamethasone;Moist Heat;Ultrasound;Balance training;Therapeutic activities;Therapeutic exercise;Functional mobility training;Stair training;Gait training;Neuromuscular re-education;Patient/family education;Manual techniques   Consulted and Agree with Plan of Care Patient      Patient will benefit from skilled therapeutic intervention in order to improve the following deficits and impairments:  Abnormal gait, Pain, Improper body mechanics, Decreased mobility, Decreased coordination, Decreased range of motion, Decreased endurance, Decreased activity tolerance, Decreased strength, Difficulty walking, Decreased balance  Visit Diagnosis: Muscle weakness (generalized)  Difficulty in walking, not elsewhere classified       G-Codes - 0April 30, [id number]    Functional Assessment Tool Used (Outpatient Only) 65mWT, 5xSTS, clinical judgement   Functional Limitation Mobility: Walking and moving around   Mobility: Walking and Moving Around Goal Status ([id number] least 20 percent but less than 40 percent impaired, limited or restricted   Mobility: Walking and Moving Around Discharge Status ([id number] least 20 percent but less than 40 percent impaired, limited or restricted      Problem List Patient Active Problem List   Diagnosis Date Noted  . AKI (acute kidney injury) ([place]  . Surgery,  elective   . History of lumbar surgery   . Post-operative pain   . Thrombocytopenia ([place]  . Type 2 diabetes mellitus without complication, without long-term current use of insulin ([place]  . Lumbar spondylosis 02/02/2015  . 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 Status post lumbar surgery 09/29/2014  . DDD (degenerative disc disease), lumbar 07/02/2014  . Facet syndrome, lumbar ([place]CR[place]06/23/2016  . Sacroiliac joint dysfunction 07/02/2014  . Lumbar degenerative disc disease 12/24/2013    Trotter,Courtney [name], Courtney [name] 4/April 30, 201812:01 PM  CoFelsenthalAIN REAdvent[facility] OrlandoERVICES [address]d[place]h[place]: [phone] Fax:  [phone]: Courtney DOOLYRN: [id number] of Birth: [birth year]

## 2016-06-02 ENCOUNTER — Encounter
Admission: RE | Disposition: A | Discharge status: Home / Self Care | Payer: Self-pay | Source: Ambulatory Visit | Attending: Gastroenterology

## 2016-06-02 ENCOUNTER — Encounter: Payer: Self-pay | Admitting: Anesthesiology

## 2016-06-02 ENCOUNTER — Ambulatory Visit
Admission: RE | Admit: 2016-06-02 | Discharge: 2016-06-02 | Disposition: A | Discharge status: Home / Self Care | Payer: Medicare Other | Source: Ambulatory Visit | Attending: Gastroenterology | Admitting: Gastroenterology

## 2016-06-02 ENCOUNTER — Ambulatory Visit: Payer: Medicare Other | Admitting: Anesthesiology

## 2016-06-02 DIAGNOSIS — G4730 Sleep apnea, unspecified: Secondary | ICD-10-CM | POA: Insufficient documentation

## 2016-06-02 DIAGNOSIS — Z85828 Personal history of other malignant neoplasm of skin: Secondary | ICD-10-CM | POA: Insufficient documentation

## 2016-06-02 DIAGNOSIS — K259 Gastric ulcer, unspecified as acute or chronic, without hemorrhage or perforation: Secondary | ICD-10-CM | POA: Insufficient documentation

## 2016-06-02 DIAGNOSIS — K317 Polyp of stomach and duodenum: Secondary | ICD-10-CM | POA: Diagnosis not present

## 2016-06-02 DIAGNOSIS — M069 Rheumatoid arthritis, unspecified: Secondary | ICD-10-CM | POA: Diagnosis not present

## 2016-06-02 DIAGNOSIS — E119 Type 2 diabetes mellitus without complications: Secondary | ICD-10-CM | POA: Diagnosis not present

## 2016-06-02 DIAGNOSIS — R1310 Dysphagia, unspecified: Secondary | ICD-10-CM | POA: Diagnosis present

## 2016-06-02 DIAGNOSIS — K224 Dyskinesia of esophagus: Secondary | ICD-10-CM | POA: Insufficient documentation

## 2016-06-02 DIAGNOSIS — K2210 Ulcer of esophagus without bleeding: Secondary | ICD-10-CM | POA: Diagnosis not present

## 2016-06-02 DIAGNOSIS — K2950 Unspecified chronic gastritis without bleeding: Secondary | ICD-10-CM | POA: Diagnosis not present

## 2016-06-02 DIAGNOSIS — Z794 Long term (current) use of insulin: Secondary | ICD-10-CM | POA: Diagnosis not present

## 2016-06-02 DIAGNOSIS — K208 Other esophagitis: Secondary | ICD-10-CM | POA: Diagnosis not present

## 2016-06-02 DIAGNOSIS — K2970 Gastritis, unspecified, without bleeding: Secondary | ICD-10-CM | POA: Insufficient documentation

## 2016-06-02 DIAGNOSIS — Z79899 Other long term (current) drug therapy: Secondary | ICD-10-CM | POA: Insufficient documentation

## 2016-06-02 DIAGNOSIS — M1990 Unspecified osteoarthritis, unspecified site: Secondary | ICD-10-CM | POA: Diagnosis not present

## 2016-06-02 DIAGNOSIS — I10 Essential (primary) hypertension: Secondary | ICD-10-CM | POA: Insufficient documentation

## 2016-06-02 DIAGNOSIS — Z7982 Long term (current) use of aspirin: Secondary | ICD-10-CM | POA: Insufficient documentation

## 2016-06-02 DIAGNOSIS — K210 Gastro-esophageal reflux disease with esophagitis: Secondary | ICD-10-CM | POA: Diagnosis not present

## 2016-06-02 HISTORY — DX: Psoriasis, unspecified: L40.9

## 2016-06-02 HISTORY — DX: Reserved for concepts with insufficient information to code with codable children: IMO0002

## 2016-06-02 HISTORY — PX: ESOPHAGOGASTRODUODENOSCOPY (EGD) WITH PROPOFOL: SHX5813

## 2016-06-02 HISTORY — DX: Unspecified osteoarthritis, unspecified site: M19.90

## 2016-06-02 HISTORY — DX: Urticaria, unspecified: L50.9

## 2016-06-02 LAB — GLUCOSE, CAPILLARY: GLUCOSE-CAPILLARY: 111 mg/dL — AB (ref 65–99)

## 2016-06-02 SURGERY — ESOPHAGOGASTRODUODENOSCOPY (EGD) WITH PROPOFOL
Anesthesia: General

## 2016-06-02 MED ORDER — SODIUM CHLORIDE 0.9 % IV SOLN: INTRAVENOUS | Status: DC

## 2016-06-02 MED ORDER — PROPOFOL 500 MG/50ML IV EMUL
INTRAVENOUS | Status: AC
  Filled 2016-06-02: qty 50

## 2016-06-02 MED ORDER — EPHEDRINE SULFATE 50 MG/ML IJ SOLN
INTRAMUSCULAR | Status: DC | PRN
  Administered 2016-06-02: 10 mg via INTRAVENOUS
  Administered 2016-06-02: 5 mg via INTRAVENOUS

## 2016-06-02 MED ORDER — LIDOCAINE HCL 2 % IJ SOLN
INTRAMUSCULAR | Status: AC
  Filled 2016-06-02: qty 10

## 2016-06-02 MED ORDER — PROPOFOL 500 MG/50ML IV EMUL
INTRAVENOUS | Status: DC | PRN
  Administered 2016-06-02: 150 ug/kg/min via INTRAVENOUS

## 2016-06-02 MED ORDER — FENTANYL CITRATE (PF) 100 MCG/2ML IJ SOLN
INTRAMUSCULAR | Status: AC
  Filled 2016-06-02: qty 2

## 2016-06-02 MED ORDER — FENTANYL CITRATE (PF) 100 MCG/2ML IJ SOLN
INTRAMUSCULAR | Status: DC | PRN
  Administered 2016-06-02: 50 ug via INTRAVENOUS

## 2016-06-02 MED ORDER — EPHEDRINE SULFATE 50 MG/ML IJ SOLN
INTRAMUSCULAR | Status: AC
  Filled 2016-06-02: qty 1

## 2016-06-02 MED ORDER — SODIUM CHLORIDE 0.9 % IV SOLN
INTRAVENOUS | Status: DC
  Administered 2016-06-02 (×2): via INTRAVENOUS

## 2016-06-02 MED ORDER — PROPOFOL 10 MG/ML IV BOLUS
INTRAVENOUS | Status: DC | PRN
  Administered 2016-06-02: 40 mg via INTRAVENOUS

## 2016-06-02 MED ORDER — LIDOCAINE HCL (CARDIAC) 20 MG/ML IV SOLN
INTRAVENOUS | Status: DC | PRN
  Administered 2016-06-02: 100 mg via INTRAVENOUS

## 2016-06-02 NOTE — Anesthesia Preprocedure Evaluation (Signed)
Anesthesia Evaluation  Patient identified by MRN, date of birth, ID band Patient awake    Reviewed: Allergy & Precautions, NPO status , Patient's Chart, lab work & pertinent test results, reviewed documented beta blocker date and time   History of Anesthesia Complications (+) PONV and history of anesthetic complications  Airway Mallampati: II  TM Distance: >3 FB     Dental  (+) Chipped   Pulmonary sleep apnea ,           Cardiovascular hypertension, Pt. on medications and Pt. on home beta blockers      Neuro/Psych  Headaches,    GI/Hepatic GERD  ,  Endo/Other  diabetes, Type 2  Renal/GU Renal disease     Musculoskeletal   Abdominal   Peds  Hematology   Anesthesia Other Findings Platelets.  Reproductive/Obstetrics                             Anesthesia Physical Anesthesia Plan  ASA: III  Anesthesia Plan: General   Post-op Pain Management:    Induction: Intravenous  Airway Management Planned:   Additional Equipment:   Intra-op Plan:   Post-operative Plan:   Informed Consent: I have reviewed the patients History and Physical, chart, labs and discussed the procedure including the risks, benefits and alternatives for the proposed anesthesia with the patient or authorized representative who has indicated his/her understanding and acceptance.     Plan Discussed with: CRNA  Anesthesia Plan Comments:         Anesthesia Quick Evaluation

## 2016-06-02 NOTE — Anesthesia Post-op Follow-up Note (Cosign Needed)
Anesthesia QCDR form completed.        

## 2016-06-02 NOTE — Transfer of Care (Signed)
Immediate Anesthesia Transfer of Care Note  Patient: Courtney [name]  Procedure(s) Performed: Procedure(s): ESOPHAGOGASTRODUODENOSCOPY (EGD) WITH PROPOFOL (N/A)  Patient Location: PACU  Anesthesia Type:General  Level of Consciousness: awake  Airway & Oxygen Therapy: Patient Spontanous Breathing and Patient connected to nasal cannula oxygen  Post-op Assessment: Report given to RN and Post -op Vital signs reviewed and stable  Post vital signs: Reviewed and stable  Last Vitals:  Vitals:   06/02/16 0706 06/02/16 0810  BP: (!) 148/105 127/63  Pulse: 83 82  Resp: 16 16  Temp: (!) 36 C 36.2 C    Last Pain:  Vitals:   06/02/16 0810  TempSrc: Tympanic         Complications: No apparent anesthesia complications

## 2016-06-02 NOTE — Anesthesia Procedure Notes (Signed)
Date/Time: 06/02/2016 7:45 AM Performed by: Allean Found Pre-anesthesia Checklist: Patient identified, Emergency Drugs available, Suction available, Patient being monitored and Timeout performed Patient Re-evaluated:Patient Re-evaluated prior to inductionOxygen Delivery Method: Nasal cannula Placement Confirmation: positive ETCO2

## 2016-06-02 NOTE — H&P (Signed)
Outpatient short stay form Pre-procedure 06/02/2016 7:28 AM Lollie Sails MD  Primary Physician: Dr. Emily Filbert  Reason for visit:  EGD  History of present illness:  Patient is a 76 year old female presenting today as above. She actually came in for EGD a little over a month ago and on intubation there seemed to be a fair amount of a retinoid and supraglottic edema. The intubation was difficult and was aborted. She had had a recent upper respiratory tract infection which was treated with steroids as well as antibiotics. She has subsequently been seen by ENT and evaluation there was uninformative/normal. She does take 81 mg aspirin however has held that for about a week. She takes no other aspirin or blood thinning agents. Is of note that her dysphagia is basically cervical and more so to liquids than solids. She does not regurgitate foods. She has had a barium swallow that shows a prominent spasm in the distal third as well as the overt appearance of prominent tertiary contractions and presbyesophagus.    Current Facility-Administered Medications:  .  0.9 %  sodium chloride infusion, , Intravenous, Continuous, Lollie Sails, MD, Last Rate: 20 mL/hr at 06/02/16 6503 .  0.9 %  sodium chloride infusion, , Intravenous, Continuous, Lollie Sails, MD  Prescriptions Prior to Admission  Medication Sig Dispense Refill Last Dose  . acetaminophen (TYLENOL) 500 MG tablet Take 500 mg by mouth every 6 (six) hours as needed.     . bisoprolol (ZEBETA) 10 MG tablet Take 1 tablet (10 mg total) by mouth daily. 30 tablet 1 06/01/2016 at 2100  . cetirizine (ZYRTEC) 10 MG tablet Take 10 mg by mouth daily.     Marland Kitchen doxazosin (CARDURA) 8 MG tablet Take 8 mg by mouth daily.    06/01/2016 at 2100  . insulin NPH Human (HUMULIN N,NOVOLIN N) 100 UNIT/ML injection Inject 0.3 mLs (30 Units total) into the skin at bedtime. 10 mL 11 06/01/2016 at 2100  . insulin NPH Human (HUMULIN N,NOVOLIN N) 100 UNIT/ML injection  Inject 0.4 mLs (40 Units total) into the skin daily before breakfast. 10 mL 11 06/02/2016 at 0600  . leflunomide (ARAVA) 10 MG tablet Take 10 mg by mouth daily.   06/01/2016 at Unknown time  . levocetirizine (XYZAL) 5 MG tablet Take 5 mg by mouth every evening.   06/01/2016 at 2100  . lisinopril (PRINIVIL,ZESTRIL) 20 MG tablet Take 20 mg by mouth daily.   06/02/2016 at 0600  . naproxen sodium (ANAPROX) 220 MG tablet Take 220 mg by mouth 2 (two) times daily as needed.     Marland Kitchen omeprazole (PRILOSEC) 20 MG capsule Take 20 mg by mouth daily.   06/01/2016 at 2100  . pantoprazole (PROTONIX) 40 MG tablet Take 40 mg by mouth daily.   06/01/2016 at 2100  . pregabalin (LYRICA) 50 MG capsule Take 50 mg by mouth 3 (three) times daily.   06/01/2016 at 2100  . simvastatin (ZOCOR) 40 MG tablet Take 40 mg by mouth daily.    06/01/2016 at 2100  . spironolactone (ALDACTONE) 25 MG tablet Take 25 mg by mouth daily.   06/02/2016 at 0600  . aspirin EC 81 MG tablet Take 81 mg by mouth daily.   05/27/2016  . diazepam (VALIUM) 5 MG tablet Take 1 tablet (5 mg total) by mouth every 6 (six) hours as needed for muscle spasms. 60 tablet 0   . hydrOXYzine (ATARAX/VISTARIL) 25 MG tablet Take 25 mg by mouth 3 (three) times daily.  12/30/2015 at Unknown time  . inFLIXimab (REMICADE) 100 MG injection Inject 500 mg into the vein every 6 (six) weeks.   More than a month at Unknown time  . insulin regular (NOVOLIN R,HUMULIN R) 100 units/mL injection Inject 5-15 Units into the skin 2 (two) times daily. 5-15 units twice per day   12/30/2015 at Unknown time  . methylPREDNISolone (MEDROL) 4 MG tablet Dispense 1 Medrol Dose Pak, take as directed 21 tablet 0   . oxyCODONE 10 MG TABS Take 1 tablet (10 mg total) by mouth every 3 (three) hours as needed for severe pain. 60 tablet 0   . ranitidine (ZANTAC) 150 MG tablet Take 150 mg by mouth 2 (two) times daily.   Past Week at Unknown time     Allergies  Allergen Reactions  . Methotrexate Derivatives  Shortness Of Breath    Breathing difficulties   . Acyclovir And Related Rash  . Codeine Nausea Only  . Contrast Media [Iodinated Diagnostic Agents] Itching and Rash  . Inderide [Propranolol-Hctz] Rash  . Lodine [Etodolac] Rash  . Procardia [Nifedipine] Rash     Past Medical History:  Diagnosis Date  . Arthritis    rheumatoid arthritis  . Cancer ([place])    skin cancer (squamous cell)  . Collagen vascular disease (HCC)    RA  . Dermatitis   . Diabetes mellitus without complication ([place])    type 2  . Eczema   . GERD (gastroesophageal reflux disease)   . Headache    migraines in the past (none since menopause)  . Hives    "chronic"  . Hypertension   . Osteoarthritis    lumbar spine, cervical spine, plantar fascitis   . PONV (postoperative nausea and vomiting)   . Psoriasis   . Rosacea   . Sleep apnea 2004   does not use cpap  . Urethral stenosis    w/ bladder polyps. followed by Dr Madelin Headings   . Urticaria    chronic   . Varicose veins     Review of systems:      Physical Exam    Heart and lungs: Regular rate and rhythm without rub or gallop, lungs are bilaterally clear.    HEENT: Normocephalic atraumatic eyes are anicteric    Other:     Pertinant exam for procedure: Soft nontender nondistended bowel sounds positive normoactive.    Planned proceedures: EGD and indicated procedures. I have discussed the risks benefits and complications of procedures to include not limited to bleeding, infection, perforation and the risk of sedation and the patient wishes to proceed.    Lollie Sails, MD Gastroenterology 06/02/2016  7:28 AM

## 2016-06-02 NOTE — Anesthesia Postprocedure Evaluation (Signed)
Anesthesia Post Note  Patient: Courtney [name]  Procedure(s) Performed: Procedure(s) (LRB): ESOPHAGOGASTRODUODENOSCOPY (EGD) WITH PROPOFOL (N/A)  Patient location during evaluation: Endoscopy Anesthesia Type: General Level of consciousness: awake and alert Pain management: pain level controlled Vital Signs Assessment: post-procedure vital signs reviewed and stable Respiratory status: spontaneous breathing, nonlabored ventilation, respiratory function stable and patient connected to nasal cannula oxygen Cardiovascular status: blood pressure returned to baseline and stable Postop Assessment: no signs of nausea or vomiting Anesthetic complications: no     Last Vitals:  Vitals:   06/02/16 0840 06/02/16 0850  BP: (!) 137/56 135/69  Pulse: 75 74  Resp: 12 19  Temp:      Last Pain:  Vitals:   06/02/16 0810  TempSrc: Tympanic                 [name] S

## 2016-06-02 NOTE — Op Note (Signed)
[facility] Gastroenterology Patient Name: Courtney [name] Procedure Date: 06/02/2016 7:39 AM MRN: [id number] Account #: [id number] Date of Birth: [birth year] Admit Type: Outpatient Age: 76 Room: [facility] ENDO ROOM 1 Gender: Female Note Status: Finalized Procedure:            Upper GI endoscopy Indications:          Dysphagia Providers:            Lollie Sails, MD Referring MD:         Rusty Aus, MD (Referring MD) Medicines:            Monitored Anesthesia Care Complications:        No immediate complications. Procedure:            Pre-Anesthesia Assessment:                       - ASA Grade Assessment: III - A patient with severe                        systemic disease.                       After obtaining informed consent, the endoscope was                        passed under direct vision. Throughout the procedure,                        the patient's blood pressure, pulse, and oxygen                        saturations were monitored continuously. The Endoscope                        was introduced through the mouth, and advanced to the                        third part of duodenum. The upper GI endoscopy was                        accomplished without difficulty. The patient tolerated                        the procedure well. Findings:      LA Grade A (one or more mucosal breaks less than 5 mm, not extending       between tops of 2 mucosal folds) esophagitis with no bleeding was found.       Biopsies were taken with a cold forceps for histology.      Abnormal motility was noted in the lower third of the esophagus. The       cricopharyngeus was normal. There is spasticity of the esophageal body.       Tertiary peristaltic waves are noted.      The exam of the esophagus was otherwise normal.      One non-bleeding superficial gastric ulcer with firm base and margins       was found on the posterior wall of the gastric antrum. The lesion was 9       mm in  largest dimension. Biopsies were taken with a cold forceps for  histology.      Patchy mild inflammation characterized by congestion (edema) and       erythema was found in the gastric body and in the gastric antrum.       Biopsies were taken with a cold forceps for histology. Biopsies were       taken with a cold forceps for Helicobacter pylori testing.      Multiple small sessile polyps with no bleeding and no stigmata of recent       bleeding were found in the gastric body. Biopsies were taken with a cold       forceps for histology.      The cardia and gastric fundus were normal on retroflexion otherwise. Impression:           - LA Grade A erosive esophagitis. Biopsied.                       - Abnormal esophageal motility, consistent with                        presbyesophagus.                       - Non-bleeding gastric ulcer with firm base and                        margins. Biopsied.                       - Gastritis. Biopsied.                       - Multiple gastric polyps. Biopsied. Recommendation:       - Discharge patient to home.                       - Use Protonix (pantoprazole) 40 mg PO BID daily.                       - Await pathology results.                       - Return to GI clinic in 3 weeks.                       - Repeat upper endoscopy in 7 weeks to check healing. Procedure Code(s):    --- Professional ---                       [phone], Esophagogastroduodenoscopy, flexible, transoral;                        with biopsy, single or multiple Diagnosis Code(s):    --- Professional ---                       K20.8, Other esophagitis                       K22.4, Dyskinesia of esophagus                       K29.70, Gastritis, unspecified, without bleeding  K31.7, Polyp of stomach and duodenum                       R13.10, Dysphagia, unspecified CPT copyright 2016 American Medical Association. All rights reserved. The codes documented in  this report are preliminary and upon coder review may  be revised to meet current compliance requirements. Lollie Sails, MD 06/02/2016 8:08:19 AM This report has been signed electronically. Number of Addenda: 0 Note Initiated On: 06/02/2016 7:39 AM      [facility]

## 2016-06-06 ENCOUNTER — Encounter: Payer: Self-pay | Admitting: Gastroenterology

## 2016-06-06 LAB — SURGICAL PATHOLOGY

## 2016-08-22 DIAGNOSIS — K2289 Other specified disease of esophagus: Secondary | ICD-10-CM | POA: Insufficient documentation

## 2016-08-22 DIAGNOSIS — E538 Deficiency of other specified B group vitamins: Secondary | ICD-10-CM | POA: Insufficient documentation

## 2016-08-25 ENCOUNTER — Other Ambulatory Visit: Payer: Self-pay | Admitting: Internal Medicine

## 2016-08-25 DIAGNOSIS — Z1231 Encounter for screening mammogram for malignant neoplasm of breast: Secondary | ICD-10-CM

## 2016-09-15 ENCOUNTER — Ambulatory Visit
Admission: RE | Admit: 2016-09-15 | Discharge: 2016-09-15 | Disposition: A | Discharge status: Home / Self Care | Payer: Medicare Other | Source: Ambulatory Visit | Attending: Internal Medicine | Admitting: Internal Medicine

## 2016-09-15 DIAGNOSIS — Z1231 Encounter for screening mammogram for malignant neoplasm of breast: Secondary | ICD-10-CM | POA: Diagnosis present

## 2016-11-06 ENCOUNTER — Encounter: Payer: Self-pay | Admitting: *Deleted

## 2016-11-07 ENCOUNTER — Encounter
Admission: RE | Disposition: A | Discharge status: Home / Self Care | Payer: Self-pay | Source: Ambulatory Visit | Attending: Gastroenterology

## 2016-11-07 ENCOUNTER — Ambulatory Visit
Admission: RE | Admit: 2016-11-07 | Discharge: 2016-11-07 | Disposition: A | Discharge status: Home / Self Care | Payer: Medicare Other | Source: Ambulatory Visit | Attending: Gastroenterology | Admitting: Gastroenterology

## 2016-11-07 ENCOUNTER — Ambulatory Visit: Payer: Medicare Other | Admitting: Anesthesiology

## 2016-11-07 ENCOUNTER — Encounter: Payer: Self-pay | Admitting: *Deleted

## 2016-11-07 DIAGNOSIS — Z885 Allergy status to narcotic agent status: Secondary | ICD-10-CM | POA: Diagnosis not present

## 2016-11-07 DIAGNOSIS — M1990 Unspecified osteoarthritis, unspecified site: Secondary | ICD-10-CM | POA: Insufficient documentation

## 2016-11-07 DIAGNOSIS — Z888 Allergy status to other drugs, medicaments and biological substances status: Secondary | ICD-10-CM | POA: Diagnosis not present

## 2016-11-07 DIAGNOSIS — E538 Deficiency of other specified B group vitamins: Secondary | ICD-10-CM | POA: Insufficient documentation

## 2016-11-07 DIAGNOSIS — Z91041 Radiographic dye allergy status: Secondary | ICD-10-CM | POA: Insufficient documentation

## 2016-11-07 DIAGNOSIS — L409 Psoriasis, unspecified: Secondary | ICD-10-CM | POA: Insufficient documentation

## 2016-11-07 DIAGNOSIS — L508 Other urticaria: Secondary | ICD-10-CM | POA: Insufficient documentation

## 2016-11-07 DIAGNOSIS — K2960 Other gastritis without bleeding: Secondary | ICD-10-CM | POA: Insufficient documentation

## 2016-11-07 DIAGNOSIS — J8410 Pulmonary fibrosis, unspecified: Secondary | ICD-10-CM | POA: Diagnosis not present

## 2016-11-07 DIAGNOSIS — Z7982 Long term (current) use of aspirin: Secondary | ICD-10-CM | POA: Diagnosis not present

## 2016-11-07 DIAGNOSIS — E119 Type 2 diabetes mellitus without complications: Secondary | ICD-10-CM | POA: Insufficient documentation

## 2016-11-07 DIAGNOSIS — I8390 Asymptomatic varicose veins of unspecified lower extremity: Secondary | ICD-10-CM | POA: Insufficient documentation

## 2016-11-07 DIAGNOSIS — L309 Dermatitis, unspecified: Secondary | ICD-10-CM | POA: Insufficient documentation

## 2016-11-07 DIAGNOSIS — Z79899 Other long term (current) drug therapy: Secondary | ICD-10-CM | POA: Diagnosis not present

## 2016-11-07 DIAGNOSIS — K224 Dyskinesia of esophagus: Secondary | ICD-10-CM | POA: Insufficient documentation

## 2016-11-07 DIAGNOSIS — M359 Systemic involvement of connective tissue, unspecified: Secondary | ICD-10-CM | POA: Insufficient documentation

## 2016-11-07 DIAGNOSIS — Z85828 Personal history of other malignant neoplasm of skin: Secondary | ICD-10-CM | POA: Diagnosis not present

## 2016-11-07 DIAGNOSIS — I10 Essential (primary) hypertension: Secondary | ICD-10-CM | POA: Diagnosis not present

## 2016-11-07 DIAGNOSIS — K317 Polyp of stomach and duodenum: Secondary | ICD-10-CM | POA: Insufficient documentation

## 2016-11-07 DIAGNOSIS — G4730 Sleep apnea, unspecified: Secondary | ICD-10-CM | POA: Diagnosis not present

## 2016-11-07 DIAGNOSIS — K259 Gastric ulcer, unspecified as acute or chronic, without hemorrhage or perforation: Secondary | ICD-10-CM | POA: Diagnosis present

## 2016-11-07 DIAGNOSIS — Z8711 Personal history of peptic ulcer disease: Secondary | ICD-10-CM | POA: Insufficient documentation

## 2016-11-07 DIAGNOSIS — K219 Gastro-esophageal reflux disease without esophagitis: Secondary | ICD-10-CM | POA: Insufficient documentation

## 2016-11-07 DIAGNOSIS — M069 Rheumatoid arthritis, unspecified: Secondary | ICD-10-CM | POA: Insufficient documentation

## 2016-11-07 DIAGNOSIS — N3592 Unspecified urethral stricture, female: Secondary | ICD-10-CM | POA: Insufficient documentation

## 2016-11-07 DIAGNOSIS — Z794 Long term (current) use of insulin: Secondary | ICD-10-CM | POA: Diagnosis not present

## 2016-11-07 HISTORY — DX: Deficiency of other specified B group vitamins: E53.8

## 2016-11-07 HISTORY — DX: Pulmonary fibrosis, unspecified: J84.10

## 2016-11-07 HISTORY — PX: ESOPHAGOGASTRODUODENOSCOPY (EGD) WITH PROPOFOL: SHX5813

## 2016-11-07 LAB — CBC WITH DIFFERENTIAL/PLATELET
BASOS ABS: 0 10*3/uL (ref 0–0.1)
Basophils Relative: 1 %
Eosinophils Absolute: 0.4 10*3/uL (ref 0–0.7)
Eosinophils Relative: 11 %
HEMATOCRIT: 39.3 % (ref 35.0–47.0)
Hemoglobin: 13.1 g/dL (ref 12.0–16.0)
LYMPHS ABS: 1.5 10*3/uL (ref 1.0–3.6)
LYMPHS PCT: 39 %
MCH: 28.3 pg (ref 26.0–34.0)
MCHC: 33.4 g/dL (ref 32.0–36.0)
MCV: 84.9 fL (ref 80.0–100.0)
MONO ABS: 0.3 10*3/uL (ref 0.2–0.9)
Monocytes Relative: 8 %
NEUTROS ABS: 1.6 10*3/uL (ref 1.4–6.5)
Neutrophils Relative %: 41 %
Platelets: 97 10*3/uL — ABNORMAL LOW (ref 150–440)
RBC: 4.63 MIL/uL (ref 3.80–5.20)
RDW: 14.8 % — AB (ref 11.5–14.5)
WBC: 3.9 10*3/uL (ref 3.6–11.0)

## 2016-11-07 LAB — GLUCOSE, CAPILLARY: GLUCOSE-CAPILLARY: 166 mg/dL — AB (ref 65–99)

## 2016-11-07 SURGERY — ESOPHAGOGASTRODUODENOSCOPY (EGD) WITH PROPOFOL
Anesthesia: General

## 2016-11-07 MED ORDER — FENTANYL CITRATE (PF) 100 MCG/2ML IJ SOLN
INTRAMUSCULAR | Status: DC | PRN
  Administered 2016-11-07: 50 ug via INTRAVENOUS

## 2016-11-07 MED ORDER — FENTANYL CITRATE (PF) 100 MCG/2ML IJ SOLN
INTRAMUSCULAR | Status: AC
  Filled 2016-11-07: qty 2

## 2016-11-07 MED ORDER — PROPOFOL 500 MG/50ML IV EMUL
INTRAVENOUS | Status: AC
  Filled 2016-11-07: qty 50

## 2016-11-07 MED ORDER — MIDAZOLAM HCL 2 MG/2ML IJ SOLN
INTRAMUSCULAR | Status: AC
  Filled 2016-11-07: qty 2

## 2016-11-07 MED ORDER — PROPOFOL 500 MG/50ML IV EMUL
INTRAVENOUS | Status: DC | PRN
  Administered 2016-11-07: 100 ug/kg/min via INTRAVENOUS

## 2016-11-07 MED ORDER — LIDOCAINE HCL (CARDIAC) 20 MG/ML IV SOLN
INTRAVENOUS | Status: DC | PRN
  Administered 2016-11-07: 30 mg via INTRAVENOUS

## 2016-11-07 MED ORDER — LIDOCAINE HCL (PF) 2 % IJ SOLN
INTRAMUSCULAR | Status: AC
  Filled 2016-11-07: qty 10

## 2016-11-07 MED ORDER — MIDAZOLAM HCL 2 MG/2ML IJ SOLN
INTRAMUSCULAR | Status: DC | PRN
  Administered 2016-11-07: 1 mg via INTRAVENOUS

## 2016-11-07 MED ORDER — SODIUM CHLORIDE 0.9 % IV SOLN
INTRAVENOUS | Status: DC
  Administered 2016-11-07: 07:00:00 via INTRAVENOUS

## 2016-11-07 MED ORDER — SODIUM CHLORIDE 0.9 % IV SOLN: INTRAVENOUS | Status: DC

## 2016-11-07 NOTE — Anesthesia Procedure Notes (Signed)
Performed by: COOK-MARTIN, Raeleen Winstanley Pre-anesthesia Checklist: Patient identified, Emergency Drugs available, Suction available, Patient being monitored and Timeout performed Patient Re-evaluated:Patient Re-evaluated prior to induction Oxygen Delivery Method: Nasal cannula Preoxygenation: Pre-oxygenation with 100% oxygen Induction Type: IV induction Airway Equipment and Method: Bite block Placement Confirmation: CO2 detector and positive ETCO2       

## 2016-11-07 NOTE — Op Note (Signed)
[facility] Gastroenterology Patient Name: Courtney [name] Procedure Date: 11/07/2016 9:15 AM MRN: [id number] Account #: [id number] Date of Birth: November 11, 1940 Admit Type: Outpatient Age: 76 Room: [facility] ENDO ROOM 3 Gender: Female Note Status: Finalized Procedure:            Upper GI endoscopy Indications:          Follow-up of gastric ulcer Providers:            Lollie Sails, MD Referring MD:         Rusty Aus, MD (Referring MD) Medicines:            Monitored Anesthesia Care Complications:        No immediate complications. Procedure:            Pre-Anesthesia Assessment:                       - ASA Grade Assessment: III - A patient with severe                        systemic disease.                       After obtaining informed consent, the endoscope was                        passed under direct vision. Throughout the procedure,                        the patient's blood pressure, pulse, and oxygen                        saturations were monitored continuously. The Endoscope                        was introduced through the mouth, and advanced to the                        third part of duodenum. The upper GI endoscopy was                        accomplished without difficulty. The patient tolerated                        the procedure well. Findings:      The examined esophagus was normal.      Abnormal motility was noted in the mid esophagus and in the distal       esophagus. The cricopharyngeus was normal. There is a decrease in       motility of the esophageal body.      Diffuse and patchy mild inflammation characterized by congestion (edema)       and erythema was found in the entire examined stomach. Biopsies were       taken with a cold forceps for histology.      A few small sessile polyps with no bleeding and no stigmata of recent       bleeding were found in the gastric body. Biopsies were taken with a cold       forceps for histology.    Patchy mild mucosal variance characterized by white specks was found in       the entire  duodenum. Biopsies were taken with a cold forceps for       histology.      The previously noted ulcer was healed with a small white dot/scar noted       , biopsy taken.      The cardia and gastric fundus were normal on retroflexion, small       cholesterol plaque noted in the upper cardia. Impression:           - Normal esophagus.                       - Abnormal esophageal motility.                       - Bile gastritis. Biopsied.                       - A few gastric polyps. Biopsied.                       - Mucosal variant in the duodenum. Biopsied. Recommendation:       - Await pathology results.                       - Use Protonix (pantoprazole) 40 mg PO daily daily.                       - Use sucralfate tablets 1 gram PO BID daily.                       - Return to GI clinic in 6 weeks.                       - consider low dose reglan for continued symptoms. Procedure Code(s):    --- Professional ---                       [phone], Esophagogastroduodenoscopy, flexible, transoral;                        with biopsy, single or multiple Diagnosis Code(s):    --- Professional ---                       K22.4, Dyskinesia of esophagus                       K29.60, Other gastritis without bleeding                       K31.7, Polyp of stomach and duodenum                       K31.89, Other diseases of stomach and duodenum                       K25.9, Gastric ulcer, unspecified as acute or chronic,                        without hemorrhage or perforation CPT copyright 2016 American Medical Association. All rights reserved. The codes documented in this report are preliminary and upon coder review may  be revised to meet current compliance requirements. Lollie Sails, MD 11/07/2016 9:42:56 AM  This report has been signed electronically. Number of Addenda: 0 Note Initiated On: 11/07/2016 9:15  AM      [facility]

## 2016-11-07 NOTE — Transfer of Care (Signed)
Immediate Anesthesia Transfer of Care Note  Patient: Courtney [name]  Procedure(s) Performed: ESOPHAGOGASTRODUODENOSCOPY (EGD) WITH PROPOFOL (N/A )  Patient Location: PACU  Anesthesia Type:General  Level of Consciousness: awake and sedated  Airway & Oxygen Therapy: Patient Spontanous Breathing and Patient connected to nasal cannula oxygen  Post-op Assessment: Report given to RN and Post -op Vital signs reviewed and stable  Post vital signs: Reviewed and stable  Last Vitals:  Vitals:   11/07/16 0712  BP: 123/61  Pulse: 83  Resp: 18  Temp: (!) 35.3 C  SpO2: 100%    Last Pain:  Vitals:   11/07/16 0712  TempSrc: Tympanic         Complications: No apparent anesthesia complications

## 2016-11-07 NOTE — Anesthesia Post-op Follow-up Note (Signed)
Anesthesia QCDR form completed.        

## 2016-11-07 NOTE — Anesthesia Preprocedure Evaluation (Signed)
Anesthesia Evaluation  Patient identified by MRN, date of birth, ID band Patient awake    Reviewed: Allergy & Precautions, NPO status , Patient's Chart, lab work & pertinent test results  History of Anesthesia Complications (+) PONV  Airway Mallampati: III       Dental  (+) Upper Dentures, Partial Lower   Pulmonary sleep apnea (not using CPAP) ,           Cardiovascular hypertension, Pt. 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)      Neuro/Psych neg Seizures    GI/Hepatic Neg liver ROS, GERD  Medicated and Controlled,  Endo/Other  diabetes, Type 2, Insulin Dependent  Renal/GU negative Renal ROS     Musculoskeletal   Abdominal   Peds  Hematology   Anesthesia Other Findings   Reproductive/Obstetrics                             Anesthesia Physical Anesthesia Plan  ASA: III  Anesthesia Plan: General   Post-op Pain Management:    Induction: Intravenous  PONV Risk Score and Plan: 3 and Ondansetron, Dexamethasone, Propofol infusion and Treatment may vary due to age or medical condition  Airway Management Planned: Nasal Cannula  Additional Equipment:   Intra-op Plan:   Post-operative Plan:   Informed Consent: I have reviewed the patients History and Physical, chart, labs and discussed the procedure including the risks, benefits and alternatives for the proposed anesthesia with the patient or authorized representative who has indicated his/her understanding and acceptance.     Plan Discussed with:   Anesthesia Plan Comments:         Anesthesia Quick Evaluation

## 2016-11-07 NOTE — H&P (Signed)
Outpatient short stay form Pre-procedure 11/07/2016 7:40 AM Courtney Sails MD  Primary Physician: Dr. Emily Filbert  Reason for visit:  EGD  History of present illness:  Patient is a 76 year old female presenting today for a follow-up on an EGD done 06/02/2016 which showed a gastric ulcer on the posterior wall gastric antrum. Biopsies were consistent with ulcer at that time. She has been on a twice a day proton pump inhibitor as well as Carafate. She continues to have symptoms of nausea.  Patient has been on leflunomide however it is been found that her platelet count has been decreasing and was taken off this medication about a week ago. We will be checking her CBC with differential today before the case.  She has been continuing take an NSAID, ibuprofen probably 3 times weekly. She also takes an 81 mg aspirin.    Current Facility-Administered Medications:  .  0.9 %  sodium chloride infusion, , Intravenous, Continuous, Courtney Sails, MD, Last Rate: 20 mL/hr at 11/07/16 0729 .  0.9 %  sodium chloride infusion, , Intravenous, Continuous, Courtney Sails, MD  Prescriptions Prior to Admission  Medication Sig Dispense Refill Last Dose  . bisoprolol (ZEBETA) 10 MG tablet Take 1 tablet (10 mg total) by mouth daily. 30 tablet 1 11/06/2016 at Unknown time  . cetirizine (ZYRTEC) 10 MG tablet Take 10 mg by mouth daily.   11/06/2016 at Unknown time  . doxazosin (CARDURA) 8 MG tablet Take 8 mg by mouth daily.    11/06/2016 at Unknown time  . inFLIXimab (REMICADE) 100 MG injection Inject 500 mg into the vein every 6 (six) weeks.   Past Month at Unknown time  . insulin NPH Human (HUMULIN N,NOVOLIN N) 100 UNIT/ML injection Inject 0.3 mLs (30 Units total) into the skin at bedtime. 10 mL 11 11/06/2016 at Unknown time  . insulin NPH Human (HUMULIN N,NOVOLIN N) 100 UNIT/ML injection Inject 0.4 mLs (40 Units total) into the skin daily before breakfast. 10 mL 11 11/06/2016 at Unknown time  . insulin  regular (NOVOLIN R,HUMULIN R) 100 units/mL injection Inject 5-15 Units into the skin 2 (two) times daily. 5-15 units twice per day   11/06/2016 at Unknown time  . lisinopril (PRINIVIL,ZESTRIL) 20 MG tablet Take 20 mg by mouth daily.   11/07/2016 at 0530  . pantoprazole (PROTONIX) 40 MG tablet Take 40 mg by mouth daily.   11/06/2016 at Unknown time  . simvastatin (ZOCOR) 40 MG tablet Take 40 mg by mouth daily.    11/06/2016 at Unknown time  . sucralfate (CARAFATE) 1 g tablet Take 1 g by mouth 2 (two) times daily.   11/06/2016 at Unknown time  . acetaminophen (TYLENOL) 500 MG tablet Take 500 mg by mouth every 6 (six) hours as needed.     Marland Kitchen aspirin EC 81 MG tablet Take 81 mg by mouth daily.   10/21/2016  . diazepam (VALIUM) 5 MG tablet Take 1 tablet (5 mg total) by mouth every 6 (six) hours as needed for muscle spasms. 60 tablet 0   . hydrOXYzine (ATARAX/VISTARIL) 25 MG tablet Take 25 mg by mouth 3 (three) times daily.   12/30/2015 at Unknown time  . leflunomide (ARAVA) 10 MG tablet Take 10 mg by mouth daily.   Not Taking at Unknown time  . levocetirizine (XYZAL) 5 MG tablet Take 5 mg by mouth every evening.   06/01/2016 at 2100  . methylPREDNISolone (MEDROL) 4 MG tablet Dispense 1 Medrol Dose Pak, take as directed (Patient  not taking: Reported on 11/07/2016) 21 tablet 0 Not Taking at Unknown time  . naproxen sodium (ANAPROX) 220 MG tablet Take 220 mg by mouth 2 (two) times daily as needed.   Not Taking at Unknown time  . omeprazole (PRILOSEC) 20 MG capsule Take 20 mg by mouth daily.   Completed Course at Unknown time  . oxyCODONE 10 MG TABS Take 1 tablet (10 mg total) by mouth every 3 (three) hours as needed for severe pain. 60 tablet 0   . pregabalin (LYRICA) 50 MG capsule Take 50 mg by mouth 3 (three) times daily.   Not Taking at Unknown time  . ranitidine (ZANTAC) 150 MG tablet Take 150 mg by mouth 2 (two) times daily.   Not Taking at Unknown time  . spironolactone (ALDACTONE) 25 MG tablet Take 25  mg by mouth daily.   Not Taking at Unknown time     Allergies  Allergen Reactions  . Methotrexate Derivatives Shortness Of Breath    Breathing difficulties   . Metrizamide   . Acyclovir And Related Rash  . Codeine Nausea Only  . Contrast Media [Iodinated Diagnostic Agents] Itching and Rash  . Inderide [Propranolol-Hctz] Rash  . Iodine Rash  . Lodine [Etodolac] Rash  . Procardia [Nifedipine] Rash     Past Medical History:  Diagnosis Date  . Arthritis    rheumatoid arthritis  . B12 deficiency   . Cancer ([place])    skin cancer (squamous cell)  . Collagen vascular disease (HCC)    RA  . Dermatitis   . Diabetes mellitus without complication ([place])    type 2  . Eczema   . 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 GERD (gastroesophageal reflux disease)   . Headache    migraines in the past (none since menopause)  . Hives    "chronic"  . Hypertension   . Osteoarthritis    lumbar spine, cervical spine, plantar fascitis   . PONV (postoperative nausea and vomiting)   . Psoriasis   . Rosacea   . Sleep apnea 2004   does not use cpap  . Urethral stenosis    w/ bladder polyps. followed by Dr Madelin Headings   . Urticaria    chronic   . Varicose veins     Review of systems:      Physical Exam    Heart and lungs: Regular rate and rhythm without rub or gallop, lungs are bilaterally clear.    HEENT: Normocephalic atraumatic eyes are anicteric    Other:     Pertinant exam for procedure: Soft nontender nondistended bowel sounds positive normoactive.    Planned proceedures: EGD and indicated procedures. I have discussed the risks benefits and complications of procedures to include not limited to bleeding, infection, perforation and the risk of sedation and the patient wishes to proceed.      Courtney Sails, MD Gastroenterology 11/07/2016  7:40 AM

## 2016-11-07 NOTE — Anesthesia Postprocedure Evaluation (Signed)
Anesthesia Post Note  Patient: MILTON STREICHER  Procedure(s) Performed: ESOPHAGOGASTRODUODENOSCOPY (EGD) WITH PROPOFOL (N/A )  Patient location during evaluation: Endoscopy Anesthesia Type: General Level of consciousness: awake and alert Pain management: pain level controlled Vital Signs Assessment: post-procedure vital signs reviewed and stable Respiratory status: spontaneous breathing and respiratory function stable Cardiovascular status: stable Anesthetic complications: no     Last Vitals:  Vitals:   11/07/16 0712 11/07/16 0939  BP: 123/61 (!) 111/58  Pulse: 83 74  Resp: 18 17  Temp: (!) 35.3 C (!) 36.1 C  SpO2: 100% 98%    Last Pain:  Vitals:   11/07/16 0939  TempSrc: Tympanic                 Xaria Judon K

## 2016-11-08 ENCOUNTER — Encounter: Payer: Self-pay | Admitting: Gastroenterology

## 2016-11-09 LAB — SURGICAL PATHOLOGY

## 2016-11-19 IMAGING — CT CT L SPINE W/ CM
1 of 8 series · 5 of 14 positions shown, 7 images · non-contrast
Comparison: MR [facility] 09/25/2013

CLINICAL DATA: Bilateral back pain.  Back surgery last year.

EXAM:
CT MYELOGRAPHY LUMBAR SPINE
TECHNIQUE: CT imaging of the lumbar spine was performed after intrathecal
contrast administration. Multiplanar CT image reconstructions were
also generated.

[Series 3: l spine soft · axial · 0.29mm/px · z∈[-825,-649]mm · 5 of 133 slices shown, 7 images]
[im 23/133  soft-tissue]
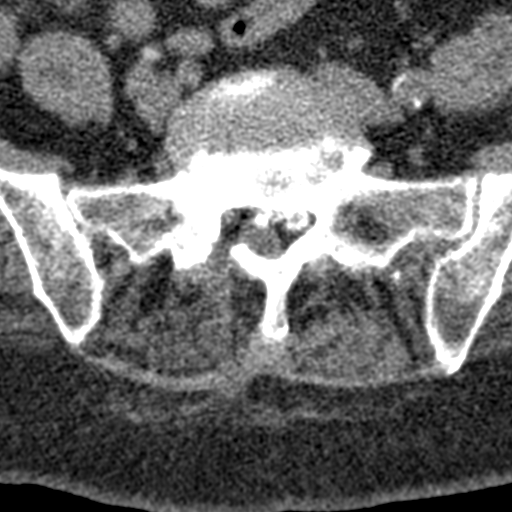
[im 23/133  bone]
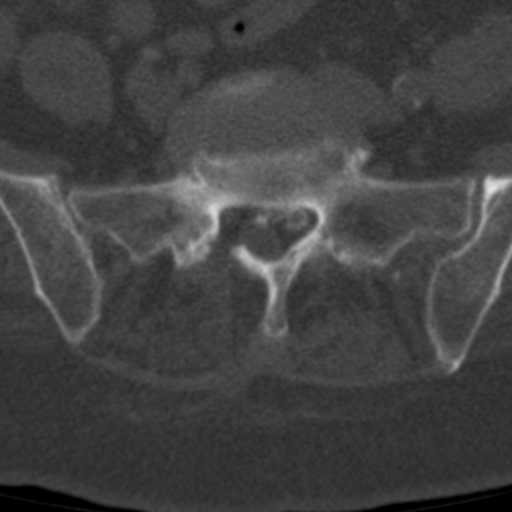
[im 45/133  bone]
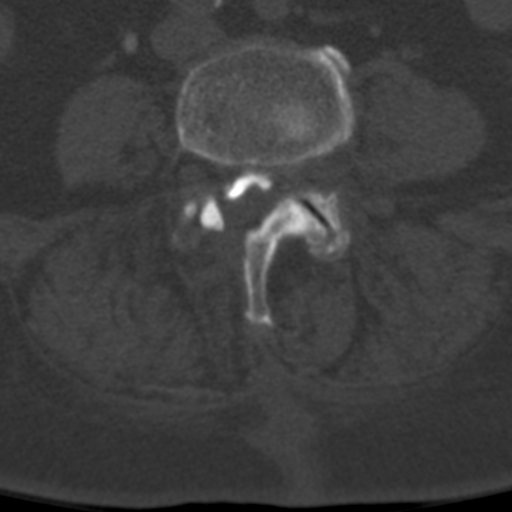
[im 67/133  bone]
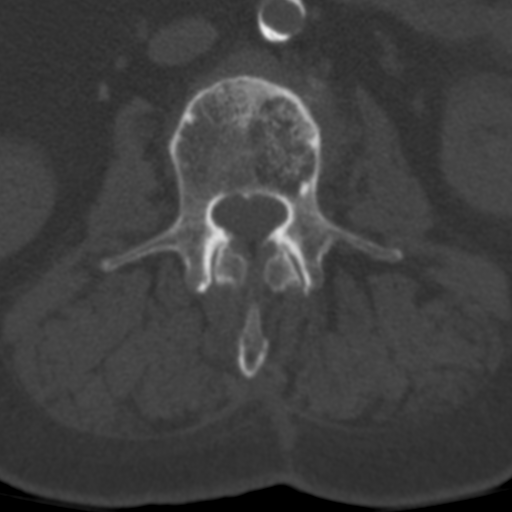
[im 89/133  bone]
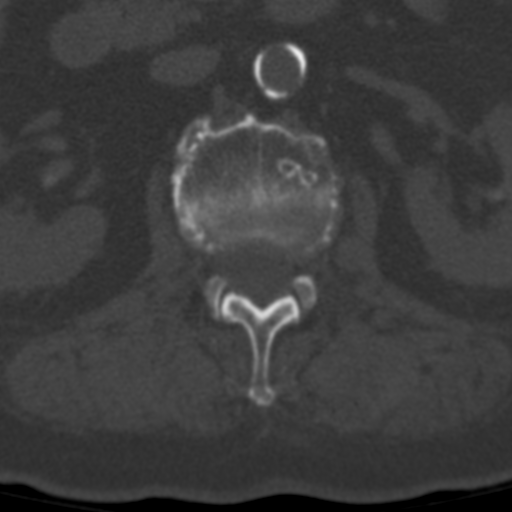
[im 111/133  soft-tissue]
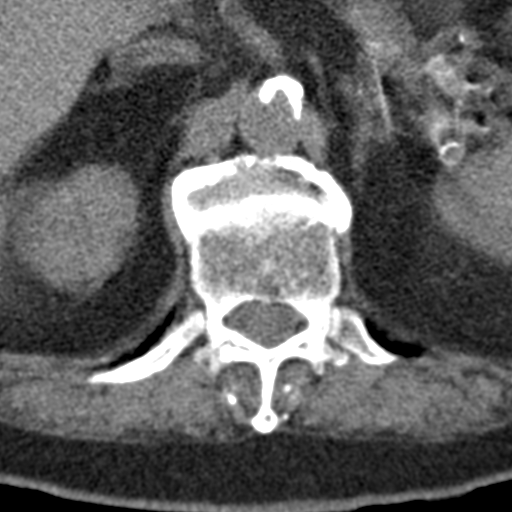
[im 111/133  bone]
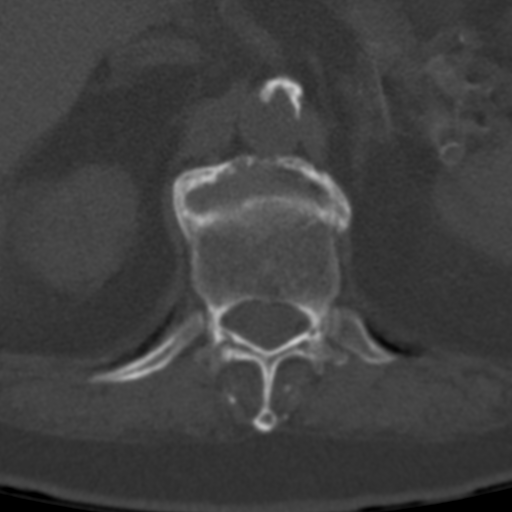

[5 of 14 positions shown; findings below may reference images not displayed]

FINDINGS: Radiologist injection

Evaluation is limited secondary to the contrast being located in the
epidural space. A small amount of contrast likely enters the
epidural venous plexus with a small amount of contrast being
excreted into the renal collecting systems.

The vertebral body heights are maintained. The paravertebral soft
tissues are normal. There is no fracture. There is 2 mm of
retrolisthesis of L3 on L4. There is 5 mm of anterolisthesis of L4
on L5 secondary to bilateral facet disease. There is no
spondylolysis. The visualized portion of the SI joints are
unremarkable.

There is extensive abdominal aortic atherosclerosis.

T11-12: Moderate-sized left paracentral disc protrusion with
narrowing of the left lateral recess. No foraminal stenosis. Mild
bilateral facet arthropathy.

T12-L1: Mild degenerative disc disease. Moderate bilateral facet
arthropathy. No foraminal or central canal stenosis.

L1-L2: Disc height is maintained. No significant disc bulge. Mild
bilateral facet arthropathy. No foraminal stenosis.

L2-L3: Mild broad-based disc bulge. Mild bilateral facet
arthropathy. No foraminal stenosis.

L3-L4: Severe degenerative disc disease with reactive endplate
changes and vacuum disc phenomenon. Broad-based disc bulge scratch
them moderate broad-based disc bulge with moderate bilateral facet
arthropathy with findings suspicious for severe spinal stenosis.
Moderate left foraminal stenosis. Prior right L3-4 laminectomy.

L4-L5: Mild degenerative disc disease. Mild broad-based disc bulge
with a left foraminal disc protrusion. Severe bilateral facet
arthropathy. Left foraminal narrowing. Prior right L4-5 laminectomy.

L5-S1: Mild broad-based disc bulge. Severe bilateral facet
arthropathy. Left foraminal stenosis. No right foraminal stenosis.
IMPRESSION: 1. Evaluation is limited secondary to the contrast being located in
the epidural space rather than within the thecal sac. In there is
further clinical concern, repeat CT myelogram can be performed
following intrathecal injection of iodinated contrast.
2. Diffuse lumbar spine spondylosis as described above, most severe
at L3-4.
3. At L3-4 there is severe degenerative disc disease with reactive
endplate changes and vacuum disc phenomenon. Broad-based disc bulge
scratch them moderate broad-based disc bulge with moderate bilateral
facet arthropathy with findings suspicious for severe spinal
stenosis. Moderate left foraminal stenosis. Prior right L3-4
laminectomy.

## 2017-07-16 ENCOUNTER — Other Ambulatory Visit: Payer: Self-pay | Admitting: Rheumatology

## 2017-07-16 DIAGNOSIS — M542 Cervicalgia: Secondary | ICD-10-CM

## 2017-07-27 ENCOUNTER — Ambulatory Visit
Admission: RE | Admit: 2017-07-27 | Discharge: 2017-07-27 | Disposition: A | Discharge status: Home / Self Care | Payer: Medicare Other | Source: Ambulatory Visit | Attending: Rheumatology | Admitting: Rheumatology

## 2017-07-27 DIAGNOSIS — M5412 Radiculopathy, cervical region: Secondary | ICD-10-CM | POA: Diagnosis not present

## 2017-07-27 DIAGNOSIS — M542 Cervicalgia: Secondary | ICD-10-CM | POA: Diagnosis present

## 2017-07-27 DIAGNOSIS — M50322 Other cervical disc degeneration at C5-C6 level: Secondary | ICD-10-CM | POA: Insufficient documentation

## 2017-07-27 DIAGNOSIS — M4802 Spinal stenosis, cervical region: Secondary | ICD-10-CM | POA: Insufficient documentation

## 2017-07-27 DIAGNOSIS — M50221 Other cervical disc displacement at C4-C5 level: Secondary | ICD-10-CM | POA: Insufficient documentation

## 2017-08-09 ENCOUNTER — Other Ambulatory Visit: Payer: Self-pay | Admitting: Neurosurgery

## 2017-08-13 ENCOUNTER — Encounter (HOSPITAL_COMMUNITY)
Admission: RE | Admit: 2017-08-13 | Discharge: 2017-08-13 | Disposition: A | Discharge status: Home / Self Care | Payer: Medicare Other | Source: Ambulatory Visit | Attending: Neurosurgery | Admitting: Neurosurgery

## 2017-08-13 ENCOUNTER — Other Ambulatory Visit: Payer: Self-pay

## 2017-08-13 ENCOUNTER — Encounter (HOSPITAL_COMMUNITY): Payer: Self-pay

## 2017-08-13 DIAGNOSIS — Z01810 Encounter for preprocedural cardiovascular examination: Secondary | ICD-10-CM | POA: Diagnosis not present

## 2017-08-13 DIAGNOSIS — Z01812 Encounter for preprocedural laboratory examination: Secondary | ICD-10-CM | POA: Diagnosis not present

## 2017-08-13 HISTORY — DX: Pneumonia, unspecified organism: J18.9

## 2017-08-13 HISTORY — DX: Other fecal abnormalities: R19.5

## 2017-08-13 HISTORY — DX: Personal history of other diseases of the digestive system: Z87.19

## 2017-08-13 LAB — CBC WITH DIFFERENTIAL/PLATELET
Abs Immature Granulocytes: 0 10*3/uL (ref 0.0–0.1)
BASOS PCT: 1 %
Basophils Absolute: 0.1 10*3/uL (ref 0.0–0.1)
EOS ABS: 0.4 10*3/uL (ref 0.0–0.7)
Eosinophils Relative: 7 %
HEMATOCRIT: 40.6 % (ref 36.0–46.0)
Hemoglobin: 12.6 g/dL (ref 12.0–15.0)
IMMATURE GRANULOCYTES: 0 %
LYMPHS ABS: 3 10*3/uL (ref 0.7–4.0)
Lymphocytes Relative: 44 %
MCH: 27.4 pg (ref 26.0–34.0)
MCHC: 31 g/dL (ref 30.0–36.0)
MCV: 88.3 fL (ref 78.0–100.0)
MONO ABS: 0.6 10*3/uL (ref 0.1–1.0)
MONOS PCT: 9 %
Neutro Abs: 2.6 10*3/uL (ref 1.7–7.7)
Neutrophils Relative %: 39 %
PLATELETS: 127 10*3/uL — AB (ref 150–400)
RBC: 4.6 MIL/uL (ref 3.87–5.11)
RDW: 13.2 % (ref 11.5–15.5)
WBC: 6.7 10*3/uL (ref 4.0–10.5)

## 2017-08-13 LAB — BASIC METABOLIC PANEL
ANION GAP: 7 (ref 5–15)
BUN: 20 mg/dL (ref 8–23)
CO2: 24 mmol/L (ref 22–32)
CREATININE: 0.9 mg/dL (ref 0.44–1.00)
Calcium: 9.1 mg/dL (ref 8.9–10.3)
Chloride: 108 mmol/L (ref 98–111)
GFR calc non Af Amer: 60 mL/min — ABNORMAL LOW (ref >60)
GLUCOSE: 208 mg/dL — AB (ref 70–99)
Potassium: 4.7 mmol/L (ref 3.5–5.1)
SODIUM: 139 mmol/L (ref 135–145)

## 2017-08-13 LAB — HEMOGLOBIN A1C
Hgb A1c MFr Bld: 6.7 % — ABNORMAL HIGH (ref 4.8–5.6)
Mean Plasma Glucose: 145.59 mg/dL

## 2017-08-13 LAB — GLUCOSE, CAPILLARY: Glucose-Capillary: 202 mg/dL — ABNORMAL HIGH (ref 70–99)

## 2017-08-13 LAB — SURGICAL PCR SCREEN
MRSA, PCR: POSITIVE — AB
STAPHYLOCOCCUS AUREUS: POSITIVE — AB

## 2017-08-13 NOTE — Pre-Procedure Instructions (Addendum)
Courtney [name]  08/13/2017    Your procedure is scheduled on Friday, August 17, 2017 at 2:15 PM.   Report to [facility] Entrance "A" Admitting Office at 12:15 PM.   Call this number if you have problems the morning of surgery: [phone]   Questions prior to day of surgery, please call [phone] between 8 & 4 PM.   Remember:  Do not eat or drink after midnight Thursday, 08/16/17.  Take these medicines the morning of surgery with A SIP OF WATER: Bisoprolol (Zebeta), Hydroxyzine (Vistaril/Atarax), Pantoprazole (Protonix), Hydrocodone or Tylenol - if needed  Thursday evening before surgery, take 1/2 of your regular dose of Humulin/Novolin N insulin (will take 22 units), Friday AM, take 1/2 of regular dose which will be 17 units.  Stop Aspirin and Herbal medications as of today. Do not use NSAIDS (Ibuprofen, Aleve, etc) prior to surgery.  Stop Arava (Leflunomide) as of today per Dr. Marchelle Folks instruction.   How to Manage Your Diabetes Before Surgery   Why is it important to control my blood sugar before and after surgery?   Improving blood sugar levels before and after surgery helps healing and can limit problems.  A way of improving blood sugar control is eating a healthy diet by:  - Eating less sugar and carbohydrates  - Increasing activity/exercise  - Talk with your doctor about reaching your blood sugar goals  High blood sugars (greater than 180 mg/dL) can raise your risk of infections and slow down your recovery so you will need to focus on controlling your diabetes during the weeks before surgery.  Make sure that the doctor who takes care of your diabetes knows about your planned surgery including the date and location.  How do I manage my blood sugars before surgery?   Check your blood sugar at least 4 times a day, 2 days before surgery to make sure that they are not too high or low.  Check your blood sugar the morning of your surgery when you wake up and every  2 hours until you get to the Short-Stay unit.  Treat a low blood sugar (less than 70 mg/dL) with 1/2 cup of clear juice (cranberry or apple), 4 glucose tablets, OR glucose gel.  Recheck blood sugar in 15 minutes after treatment (to make sure it is greater than 70 mg/dL).  If blood sugar is not greater than 70 mg/dL on re-check, call [phone] for further instructions.   Report your blood sugar to the Short-Stay nurse when you get to Short-Stay.  References:  [facility], 2007 "How to Manage your Diabetes Before and After Surgery".    Do not wear jewelry, make-up or nail polish.  Do not wear lotions, powders, perfumes or deodorant.  Do not shave 48 hours prior to surgery.    Do not bring valuables to the hospital.  [facility] is not responsible for any belongings or valuables.  Contacts, dentures or bridgework may not be worn into surgery.  Leave your suitcase in the car.  After surgery it may be brought to your room.  For patients admitted to the hospital, discharge time will be determined by your treatment team.  [facility] - Preparing for Surgery  Before surgery, you can play an important role.  Because skin is not sterile, your skin needs to be as free of germs as possible.  You can reduce the number of germs on you skin by washing with CHG (chlorahexidine gluconate) soap before surgery.  CHG is an antiseptic cleaner which kills germs and bonds with the skin to continue killing germs even after washing.  Oral Hygiene is also important in reducing the risk of infection.  Remember to brush your teeth with your regular toothpaste the morning of surgery.  Please DO NOT use if you have an allergy to CHG or antibacterial soaps.  If your skin becomes reddened/irritated stop using the CHG and inform your nurse when you arrive at Short Stay.  Do not shave (including legs and underarms) for at least 48 hours prior to the first CHG shower.  You may shave your  face.  Please follow these instructions carefully:   1.  Shower with CHG Soap the night before surgery and the morning of Surgery.  2.  If you choose to wash your hair, wash your hair first as usual with your normal shampoo.  3.  After you shampoo, rinse your hair and body thoroughly to remove the shampoo. 4.  Use CHG as you would any other liquid soap.  You can apply chg directly to the skin and wash gently with a      scrungie or washcloth.           5.  Apply the CHG Soap to your body ONLY FROM THE NECK DOWN.   Do not use on open wounds or open sores. Avoid contact with your eyes, ears, mouth and genitals (private parts).  Wash genitals (private parts) with your normal soap.  6.  Wash thoroughly, paying special attention to the area where your surgery will be performed.  7.  Thoroughly rinse your body with warm water from the neck down.  8.  DO NOT shower/wash with your normal soap after using and rinsing off the CHG Soap.  9.  Pat yourself dry with a clean towel.            10.  Wear clean pajamas.            11.  Place clean sheets on your bed the night of your first shower and do not sleep with pets.  Day of Surgery  Shower as above. Do not apply any lotions/deodorants the morning of surgery.   Please wear clean clothes to the hospital. Remember to brush your teeth with toothpaste.   Please read over the fact sheets that you were given.

## 2017-08-13 NOTE — Progress Notes (Signed)
Pt denies cardiac history, chest pain or sob. Pt is a type 2 diabetic. Last A1C was 7.2 on 05/28/17. States fasting blood sugar is usually around 150. Pt is on Leflunomide Jolee Ewing). She states she has not been instructed to stop it prior to surgery. I called Lorriane Shire at Dr. Marchelle Folks office while pt was here with me and Lorriane Shire states pt needs to stop it as of today. I've gave pt those instructions.

## 2017-08-17 MED ORDER — DEXAMETHASONE SODIUM PHOSPHATE 10 MG/ML IJ SOLN
10.0000 mg | INTRAMUSCULAR | Status: DC
  Filled 2017-08-17: qty 1

## 2017-08-17 MED ORDER — CEFAZOLIN SODIUM-DEXTROSE 2-4 GM/100ML-% IV SOLN
2.0000 g | INTRAVENOUS | Status: AC
  Administered 2017-08-20: 2 g via INTRAVENOUS
  Filled 2017-08-17: qty 100

## 2017-08-17 MED ORDER — VANCOMYCIN HCL IN DEXTROSE 1-5 GM/200ML-% IV SOLN
1000.0000 mg | INTRAVENOUS | Status: DC
  Filled 2017-08-17: qty 200

## 2017-08-20 ENCOUNTER — Inpatient Hospital Stay (HOSPITAL_COMMUNITY): Payer: Medicare Other | Admitting: Anesthesiology

## 2017-08-20 ENCOUNTER — Inpatient Hospital Stay (HOSPITAL_COMMUNITY)
Admission: RE | Admit: 2017-08-20 | Discharge: 2017-08-21 | DRG: 472 | Disposition: A | Discharge status: Home / Self Care | Payer: Medicare Other | Source: Ambulatory Visit | Attending: Neurosurgery | Admitting: Neurosurgery

## 2017-08-20 ENCOUNTER — Encounter (HOSPITAL_COMMUNITY)
Admission: RE | Disposition: A | Discharge status: Home / Self Care | Payer: Self-pay | Source: Ambulatory Visit | Attending: Neurosurgery

## 2017-08-20 ENCOUNTER — Inpatient Hospital Stay (HOSPITAL_COMMUNITY): Payer: Medicare Other

## 2017-08-20 ENCOUNTER — Other Ambulatory Visit: Payer: Self-pay

## 2017-08-20 ENCOUNTER — Encounter (HOSPITAL_COMMUNITY): Payer: Self-pay

## 2017-08-20 DIAGNOSIS — Z885 Allergy status to narcotic agent status: Secondary | ICD-10-CM

## 2017-08-20 DIAGNOSIS — M069 Rheumatoid arthritis, unspecified: Secondary | ICD-10-CM | POA: Diagnosis present

## 2017-08-20 DIAGNOSIS — E119 Type 2 diabetes mellitus without complications: Secondary | ICD-10-CM | POA: Diagnosis not present

## 2017-08-20 DIAGNOSIS — Z79899 Other long term (current) drug therapy: Secondary | ICD-10-CM

## 2017-08-20 DIAGNOSIS — Z419 Encounter for procedure for purposes other than remedying health state, unspecified: Secondary | ICD-10-CM

## 2017-08-20 DIAGNOSIS — K219 Gastro-esophageal reflux disease without esophagitis: Secondary | ICD-10-CM | POA: Diagnosis not present

## 2017-08-20 DIAGNOSIS — M4722 Other spondylosis with radiculopathy, cervical region: Secondary | ICD-10-CM | POA: Diagnosis not present

## 2017-08-20 DIAGNOSIS — M5412 Radiculopathy, cervical region: Secondary | ICD-10-CM | POA: Diagnosis present

## 2017-08-20 DIAGNOSIS — M4802 Spinal stenosis, cervical region: Principal | ICD-10-CM | POA: Diagnosis present

## 2017-08-20 DIAGNOSIS — Z85828 Personal history of other malignant neoplasm of skin: Secondary | ICD-10-CM

## 2017-08-20 DIAGNOSIS — G4730 Sleep apnea, unspecified: Secondary | ICD-10-CM | POA: Diagnosis present

## 2017-08-20 DIAGNOSIS — Z888 Allergy status to other drugs, medicaments and biological substances status: Secondary | ICD-10-CM | POA: Diagnosis not present

## 2017-08-20 DIAGNOSIS — Z7982 Long term (current) use of aspirin: Secondary | ICD-10-CM

## 2017-08-20 DIAGNOSIS — Z91041 Radiographic dye allergy status: Secondary | ICD-10-CM | POA: Diagnosis not present

## 2017-08-20 DIAGNOSIS — I10 Essential (primary) hypertension: Secondary | ICD-10-CM | POA: Diagnosis present

## 2017-08-20 DIAGNOSIS — E669 Obesity, unspecified: Secondary | ICD-10-CM | POA: Diagnosis present

## 2017-08-20 DIAGNOSIS — G992 Myelopathy in diseases classified elsewhere: Secondary | ICD-10-CM | POA: Diagnosis present

## 2017-08-20 DIAGNOSIS — Z6830 Body mass index (BMI) 30.0-30.9, adult: Secondary | ICD-10-CM | POA: Diagnosis not present

## 2017-08-20 DIAGNOSIS — Z794 Long term (current) use of insulin: Secondary | ICD-10-CM

## 2017-08-20 HISTORY — PX: ANTERIOR CERVICAL DECOMP/DISCECTOMY FUSION: SHX1161

## 2017-08-20 LAB — GLUCOSE, CAPILLARY
GLUCOSE-CAPILLARY: 105 mg/dL — AB (ref 70–99)
GLUCOSE-CAPILLARY: 256 mg/dL — AB (ref 70–99)
Glucose-Capillary: 134 mg/dL — ABNORMAL HIGH (ref 70–99)
Glucose-Capillary: 158 mg/dL — ABNORMAL HIGH (ref 70–99)

## 2017-08-20 SURGERY — ANTERIOR CERVICAL DECOMPRESSION/DISCECTOMY FUSION 2 LEVELS
Anesthesia: General | Site: Spine Cervical

## 2017-08-20 MED ORDER — THROMBIN 5000 UNITS EX SOLR
CUTANEOUS | Status: AC
  Filled 2017-08-20: qty 15000

## 2017-08-20 MED ORDER — SUGAMMADEX SODIUM 200 MG/2ML IV SOLN
INTRAVENOUS | Status: DC | PRN
  Administered 2017-08-20: 200 mg via INTRAVENOUS

## 2017-08-20 MED ORDER — FENTANYL CITRATE (PF) 100 MCG/2ML IJ SOLN
INTRAMUSCULAR | Status: AC
  Filled 2017-08-20: qty 2

## 2017-08-20 MED ORDER — HYDROCODONE-ACETAMINOPHEN 5-325 MG PO TABS
1.0000 | ORAL_TABLET | ORAL | Status: DC | PRN
  Administered 2017-08-21 (×2): 1 via ORAL
  Filled 2017-08-20 (×3): qty 1

## 2017-08-20 MED ORDER — DOXAZOSIN MESYLATE 8 MG PO TABS
8.0000 mg | ORAL_TABLET | Freq: Every day | ORAL | Status: DC
  Administered 2017-08-20: 8 mg via ORAL
  Filled 2017-08-20: qty 1

## 2017-08-20 MED ORDER — FENTANYL CITRATE (PF) 100 MCG/2ML IJ SOLN
INTRAMUSCULAR | Status: DC | PRN
  Administered 2017-08-20 (×3): 50 ug via INTRAVENOUS
  Administered 2017-08-20: 100 ug via INTRAVENOUS

## 2017-08-20 MED ORDER — ONDANSETRON HCL 4 MG/2ML IJ SOLN
4.0000 mg | Freq: Once | INTRAMUSCULAR | Status: AC | PRN
  Administered 2017-08-20: 4 mg via INTRAVENOUS

## 2017-08-20 MED ORDER — 0.9 % SODIUM CHLORIDE (POUR BTL) OPTIME
TOPICAL | Status: DC | PRN
  Administered 2017-08-20: 1000 mL

## 2017-08-20 MED ORDER — SODIUM CHLORIDE 0.9 % IV SOLN: 250.0000 mL | INTRAVENOUS | Status: DC

## 2017-08-20 MED ORDER — PROPOFOL 10 MG/ML IV BOLUS
INTRAVENOUS | Status: AC
  Filled 2017-08-20: qty 20

## 2017-08-20 MED ORDER — HYDROXYZINE HCL 50 MG/ML IM SOLN
50.0000 mg | INTRAMUSCULAR | Status: DC | PRN
  Administered 2017-08-20: 50 mg via INTRAMUSCULAR
  Filled 2017-08-20: qty 1

## 2017-08-20 MED ORDER — LEFLUNOMIDE 20 MG PO TABS
10.0000 mg | ORAL_TABLET | Freq: Every day | ORAL | Status: DC
  Administered 2017-08-20 – 2017-08-21 (×2): 10 mg via ORAL
  Filled 2017-08-20 (×2): qty 0.5

## 2017-08-20 MED ORDER — PROPOFOL 10 MG/ML IV BOLUS
INTRAVENOUS | Status: DC | PRN
  Administered 2017-08-20: 150 mg via INTRAVENOUS
  Administered 2017-08-20: 50 mg via INTRAVENOUS

## 2017-08-20 MED ORDER — LISINOPRIL 20 MG PO TABS
20.0000 mg | ORAL_TABLET | Freq: Every day | ORAL | Status: DC
  Administered 2017-08-20 – 2017-08-21 (×2): 20 mg via ORAL
  Filled 2017-08-20 (×2): qty 1

## 2017-08-20 MED ORDER — SODIUM CHLORIDE 0.9 % IV SOLN
INTRAVENOUS | Status: DC | PRN
  Administered 2017-08-20: 25 ug/min via INTRAVENOUS

## 2017-08-20 MED ORDER — CYCLOBENZAPRINE HCL 10 MG PO TABS
10.0000 mg | ORAL_TABLET | Freq: Three times a day (TID) | ORAL | Status: DC | PRN
  Administered 2017-08-21: 10 mg via ORAL
  Filled 2017-08-20 (×2): qty 1

## 2017-08-20 MED ORDER — SPIRONOLACTONE 25 MG PO TABS
25.0000 mg | ORAL_TABLET | Freq: Every day | ORAL | Status: DC
  Administered 2017-08-20 – 2017-08-21 (×2): 25 mg via ORAL
  Filled 2017-08-20 (×2): qty 1

## 2017-08-20 MED ORDER — HYDROMORPHONE HCL 1 MG/ML IJ SOLN
INTRAMUSCULAR | Status: DC | PRN
  Administered 2017-08-20: 0.5 mg via INTRAVENOUS

## 2017-08-20 MED ORDER — THROMBIN 5000 UNITS EX SOLR
OROMUCOSAL | Status: DC | PRN
  Administered 2017-08-20: 14:00:00

## 2017-08-20 MED ORDER — INSULIN NPH (HUMAN) (ISOPHANE) 100 UNIT/ML ~~LOC~~ SUSP
35.0000 [IU] | Freq: Every day | SUBCUTANEOUS | Status: DC
  Administered 2017-08-20: 35 [IU] via SUBCUTANEOUS

## 2017-08-20 MED ORDER — ONDANSETRON HCL 4 MG/2ML IJ SOLN
INTRAMUSCULAR | Status: AC
  Administered 2017-08-20: 4 mg via INTRAVENOUS
  Filled 2017-08-20: qty 2

## 2017-08-20 MED ORDER — MIDAZOLAM HCL 2 MG/2ML IJ SOLN
INTRAMUSCULAR | Status: AC
  Filled 2017-08-20: qty 2

## 2017-08-20 MED ORDER — FENTANYL CITRATE (PF) 100 MCG/2ML IJ SOLN
25.0000 ug | INTRAMUSCULAR | Status: DC | PRN
  Administered 2017-08-20: 25 ug via INTRAVENOUS

## 2017-08-20 MED ORDER — LORATADINE 10 MG PO TABS
10.0000 mg | ORAL_TABLET | Freq: Every day | ORAL | Status: DC
  Administered 2017-08-21: 10 mg via ORAL
  Filled 2017-08-20: qty 1

## 2017-08-20 MED ORDER — DEXAMETHASONE SODIUM PHOSPHATE 4 MG/ML IJ SOLN
INTRAMUSCULAR | Status: DC | PRN
  Administered 2017-08-20: 10 mg via INTRAVENOUS

## 2017-08-20 MED ORDER — ACETAMINOPHEN 650 MG RE SUPP: 650.0000 mg | RECTAL | Status: DC | PRN

## 2017-08-20 MED ORDER — ONDANSETRON HCL 4 MG/2ML IJ SOLN: 4.0000 mg | Freq: Four times a day (QID) | INTRAMUSCULAR | Status: DC | PRN

## 2017-08-20 MED ORDER — VITAMIN B-12 1000 MCG PO TABS: 2500.0000 ug | ORAL_TABLET | ORAL | Status: DC

## 2017-08-20 MED ORDER — PHENYLEPHRINE 40 MCG/ML (10ML) SYRINGE FOR IV PUSH (FOR BLOOD PRESSURE SUPPORT)
PREFILLED_SYRINGE | INTRAVENOUS | Status: AC
  Filled 2017-08-20: qty 10

## 2017-08-20 MED ORDER — BIOTIN 1000 MCG PO TABS: 1000.0000 ug | ORAL_TABLET | Freq: Every day | ORAL | Status: DC

## 2017-08-20 MED ORDER — ACETAMINOPHEN 325 MG PO TABS: 650.0000 mg | ORAL_TABLET | ORAL | Status: DC | PRN

## 2017-08-20 MED ORDER — PHENOL 1.4 % MT LIQD: 1.0000 | OROMUCOSAL | Status: DC | PRN

## 2017-08-20 MED ORDER — THROMBIN 5000 UNITS EX SOLR
CUTANEOUS | Status: AC
  Filled 2017-08-20: qty 10000

## 2017-08-20 MED ORDER — CHLORHEXIDINE GLUCONATE CLOTH 2 % EX PADS
6.0000 | MEDICATED_PAD | Freq: Every day | CUTANEOUS | Status: DC
  Administered 2017-08-21: 6 via TOPICAL

## 2017-08-20 MED ORDER — LIDOCAINE 2% (20 MG/ML) 5 ML SYRINGE
INTRAMUSCULAR | Status: DC | PRN
  Administered 2017-08-20: 50 mg via INTRAVENOUS

## 2017-08-20 MED ORDER — MENTHOL 3 MG MT LOZG: 1.0000 | LOZENGE | OROMUCOSAL | Status: DC | PRN

## 2017-08-20 MED ORDER — SODIUM CHLORIDE 0.9% FLUSH: 3.0000 mL | INTRAVENOUS | Status: DC | PRN

## 2017-08-20 MED ORDER — VITAMIN B-12 2500 MCG SL SUBL: 2500.0000 ug | SUBLINGUAL_TABLET | SUBLINGUAL | Status: DC

## 2017-08-20 MED ORDER — HYDROMORPHONE HCL 1 MG/ML IJ SOLN: 1.0000 mg | INTRAMUSCULAR | Status: DC | PRN

## 2017-08-20 MED ORDER — PANTOPRAZOLE SODIUM 40 MG PO TBEC
40.0000 mg | DELAYED_RELEASE_TABLET | Freq: Two times a day (BID) | ORAL | Status: DC
  Administered 2017-08-20 – 2017-08-21 (×2): 40 mg via ORAL
  Filled 2017-08-20 (×2): qty 1

## 2017-08-20 MED ORDER — HYDROMORPHONE HCL 1 MG/ML IJ SOLN
INTRAMUSCULAR | Status: AC
  Filled 2017-08-20: qty 0.5

## 2017-08-20 MED ORDER — SODIUM CHLORIDE 0.9% FLUSH
3.0000 mL | Freq: Two times a day (BID) | INTRAVENOUS | Status: DC
  Administered 2017-08-20: 3 mL via INTRAVENOUS

## 2017-08-20 MED ORDER — THROMBIN 5000 UNITS EX SOLR
CUTANEOUS | Status: DC | PRN
  Administered 2017-08-20 (×4): 5000 [IU] via TOPICAL

## 2017-08-20 MED ORDER — CEFAZOLIN SODIUM-DEXTROSE 1-4 GM/50ML-% IV SOLN
1.0000 g | Freq: Three times a day (TID) | INTRAVENOUS | Status: AC
  Administered 2017-08-20 – 2017-08-21 (×2): 1 g via INTRAVENOUS
  Filled 2017-08-20 (×2): qty 50

## 2017-08-20 MED ORDER — PHENYLEPHRINE 40 MCG/ML (10ML) SYRINGE FOR IV PUSH (FOR BLOOD PRESSURE SUPPORT)
PREFILLED_SYRINGE | INTRAVENOUS | Status: DC | PRN
  Administered 2017-08-20: 40 ug via INTRAVENOUS
  Administered 2017-08-20: 80 ug via INTRAVENOUS
  Administered 2017-08-20 (×3): 40 ug via INTRAVENOUS
  Administered 2017-08-20: 80 ug via INTRAVENOUS

## 2017-08-20 MED ORDER — HYDROXYZINE HCL 25 MG PO TABS
25.0000 mg | ORAL_TABLET | Freq: Every day | ORAL | Status: DC
  Administered 2017-08-21: 25 mg via ORAL
  Filled 2017-08-20: qty 1

## 2017-08-20 MED ORDER — LACTATED RINGERS IV SOLN
INTRAVENOUS | Status: DC | PRN
  Administered 2017-08-20 (×2): via INTRAVENOUS

## 2017-08-20 MED ORDER — INSULIN NPH (HUMAN) (ISOPHANE) 100 UNIT/ML ~~LOC~~ SUSP
45.0000 [IU] | Freq: Every day | SUBCUTANEOUS | Status: DC
  Administered 2017-08-21: 45 [IU] via SUBCUTANEOUS
  Filled 2017-08-20: qty 10

## 2017-08-20 MED ORDER — HEMOSTATIC AGENTS (NO CHARGE) OPTIME
TOPICAL | Status: DC | PRN
  Administered 2017-08-20 (×2): 1 via TOPICAL

## 2017-08-20 MED ORDER — CHLORHEXIDINE GLUCONATE CLOTH 2 % EX PADS: 6.0000 | MEDICATED_PAD | Freq: Once | CUTANEOUS | Status: DC

## 2017-08-20 MED ORDER — BISOPROLOL FUMARATE 10 MG PO TABS
10.0000 mg | ORAL_TABLET | Freq: Every day | ORAL | Status: DC
  Administered 2017-08-21: 10 mg via ORAL
  Filled 2017-08-20: qty 1

## 2017-08-20 MED ORDER — ONDANSETRON HCL 4 MG PO TABS: 4.0000 mg | ORAL_TABLET | Freq: Four times a day (QID) | ORAL | Status: DC | PRN

## 2017-08-20 MED ORDER — MIDAZOLAM HCL 5 MG/5ML IJ SOLN
INTRAMUSCULAR | Status: DC | PRN
  Administered 2017-08-20: 1 mg via INTRAVENOUS

## 2017-08-20 MED ORDER — HYDROCODONE-ACETAMINOPHEN 10-325 MG PO TABS: 2.0000 | ORAL_TABLET | ORAL | Status: DC | PRN

## 2017-08-20 MED ORDER — EPHEDRINE SULFATE-NACL 50-0.9 MG/10ML-% IV SOSY
PREFILLED_SYRINGE | INTRAVENOUS | Status: DC | PRN
  Administered 2017-08-20 (×4): 10 mg via INTRAVENOUS

## 2017-08-20 MED ORDER — EPHEDRINE 5 MG/ML INJ
INTRAVENOUS | Status: AC
  Filled 2017-08-20: qty 10

## 2017-08-20 MED ORDER — INSULIN ASPART 100 UNIT/ML ~~LOC~~ SOLN
0.0000 [IU] | Freq: Three times a day (TID) | SUBCUTANEOUS | Status: DC
  Administered 2017-08-21: 7 [IU] via SUBCUTANEOUS

## 2017-08-20 MED ORDER — FENTANYL CITRATE (PF) 250 MCG/5ML IJ SOLN
INTRAMUSCULAR | Status: AC
  Filled 2017-08-20: qty 5

## 2017-08-20 MED ORDER — ROCURONIUM BROMIDE 50 MG/5ML IV SOSY
PREFILLED_SYRINGE | INTRAVENOUS | Status: DC | PRN
  Administered 2017-08-20: 50 mg via INTRAVENOUS
  Administered 2017-08-20: 10 mg via INTRAVENOUS

## 2017-08-20 MED ORDER — SIMVASTATIN 20 MG PO TABS
40.0000 mg | ORAL_TABLET | Freq: Every day | ORAL | Status: DC
  Administered 2017-08-20: 40 mg via ORAL
  Filled 2017-08-20: qty 2

## 2017-08-20 MED ORDER — SODIUM CHLORIDE 0.9 % IV SOLN
INTRAVENOUS | Status: DC | PRN
  Administered 2017-08-20: 500 mL

## 2017-08-20 MED ORDER — ONDANSETRON HCL 4 MG/2ML IJ SOLN
INTRAMUSCULAR | Status: DC | PRN
  Administered 2017-08-20: 4 mg via INTRAVENOUS

## 2017-08-20 MED ORDER — VITAMIN D 1000 UNITS PO TABS
1000.0000 [IU] | ORAL_TABLET | Freq: Every day | ORAL | Status: DC
  Administered 2017-08-21: 1000 [IU] via ORAL
  Filled 2017-08-20: qty 1

## 2017-08-20 MED ORDER — MUPIROCIN 2 % EX OINT
1.0000 "application " | TOPICAL_OINTMENT | Freq: Two times a day (BID) | CUTANEOUS | Status: DC
  Administered 2017-08-20 – 2017-08-21 (×2): 1 via NASAL
  Filled 2017-08-20: qty 22

## 2017-08-20 SURGICAL SUPPLY — 59 items
BAG DECANTER FOR FLEXI CONT (MISCELLANEOUS) ×2 IMPLANT
BENZOIN TINCTURE PRP APPL 2/3 (GAUZE/BANDAGES/DRESSINGS) ×2 IMPLANT
BIT DRILL 13 (BIT) ×2 IMPLANT
BUR MATCHSTICK NEURO 3.0 LAGG (BURR) ×2 IMPLANT
CAGE PEEK 6X14X11 (Cage) ×1 IMPLANT
CAGE PEEK 7X14X11 (Cage) ×1 IMPLANT
CANISTER SUCT 3000ML PPV (MISCELLANEOUS) ×2 IMPLANT
CARTRIDGE OIL MAESTRO DRILL (MISCELLANEOUS) ×1 IMPLANT
DIFFUSER DRILL AIR PNEUMATIC (MISCELLANEOUS) ×2 IMPLANT
DRAPE C-ARM 42X72 X-RAY (DRAPES) ×4 IMPLANT
DRAPE LAPAROTOMY 100X72 PEDS (DRAPES) ×2 IMPLANT
DRAPE MICROSCOPE LEICA (MISCELLANEOUS) ×2 IMPLANT
DURAPREP 6ML APPLICATOR 50/CS (WOUND CARE) ×2 IMPLANT
ELECT COATED BLADE 2.86 ST (ELECTRODE) ×2 IMPLANT
ELECT REM PT RETURN 9FT ADLT (ELECTROSURGICAL) ×2
ELECTRODE REM PT RTRN 9FT ADLT (ELECTROSURGICAL) ×1 IMPLANT
GAUZE 4X4 16PLY RFD (DISPOSABLE) IMPLANT
GAUZE SPONGE 4X4 12PLY STRL (GAUZE/BANDAGES/DRESSINGS) ×2 IMPLANT
GLOVE BIO SURGEON STRL SZ 6.5 (GLOVE) ×8 IMPLANT
GLOVE BIOGEL PI IND STRL 6.5 (GLOVE) ×4 IMPLANT
GLOVE BIOGEL PI INDICATOR 6.5 (GLOVE) ×4
GLOVE ECLIPSE 9.0 STRL (GLOVE) ×2 IMPLANT
GOWN STRL REUS W/ TWL LRG LVL3 (GOWN DISPOSABLE) ×3 IMPLANT
GOWN STRL REUS W/ TWL XL LVL3 (GOWN DISPOSABLE) ×1 IMPLANT
GOWN STRL REUS W/TWL 2XL LVL3 (GOWN DISPOSABLE) IMPLANT
GOWN STRL REUS W/TWL LRG LVL3 (GOWN DISPOSABLE) ×3
GOWN STRL REUS W/TWL XL LVL3 (GOWN DISPOSABLE) ×1
HALTER HD/CHIN CERV TRACTION D (MISCELLANEOUS) ×2 IMPLANT
HEMOSTAT POWDER KIT SURGIFOAM (HEMOSTASIS) IMPLANT
HEMOSTAT POWDER SURGIFOAM 1G (HEMOSTASIS) ×2 IMPLANT
HEMOSTAT SURGICEL 2X14 (HEMOSTASIS) IMPLANT
IV CATH AUTO 14GX1.75 SAFE ORG (IV SOLUTION) ×2 IMPLANT
KIT BASIN OR (CUSTOM PROCEDURE TRAY) ×2 IMPLANT
KIT TURNOVER KIT B (KITS) ×2 IMPLANT
NEEDLE SPNL 20GX3.5 QUINCKE YW (NEEDLE) ×2 IMPLANT
NS IRRIG 1000ML POUR BTL (IV SOLUTION) ×2 IMPLANT
OIL CARTRIDGE MAESTRO DRILL (MISCELLANEOUS) ×2
PACK LAMINECTOMY NEURO (CUSTOM PROCEDURE TRAY) ×2 IMPLANT
PAD ARMBOARD 7.5X6 YLW CONV (MISCELLANEOUS) ×6 IMPLANT
PLATE ELITE 42MM (Plate) ×2 IMPLANT
RUBBERBAND STERILE (MISCELLANEOUS) ×4 IMPLANT
SCREW ST 13X4XST VA NS SPNE (Screw) ×6 IMPLANT
SCREW ST VAR 4 ATL (Screw) ×6 IMPLANT
SPACER SPNL 11X14X6XPEEK CVD (Cage) ×1 IMPLANT
SPACER SPNL 11X14X7XPEEK CVD (Cage) ×1 IMPLANT
SPCR SPNL 11X14X6XPEEK CVD (Cage) ×1 IMPLANT
SPCR SPNL 11X14X7XPEEK CVD (Cage) ×1 IMPLANT
SPONGE INTESTINAL PEANUT (DISPOSABLE) ×2 IMPLANT
SPONGE SURGIFOAM ABS GEL SZ50 (HEMOSTASIS) ×4 IMPLANT
STRIP CLOSURE SKIN 1/2X4 (GAUZE/BANDAGES/DRESSINGS) ×2 IMPLANT
SUT VIC AB 3-0 SH 8-18 (SUTURE) ×2 IMPLANT
SUT VIC AB 4-0 RB1 18 (SUTURE) ×2 IMPLANT
SYR 20CC LL (SYRINGE) ×2 IMPLANT
TAPE CLOTH 4X10 WHT NS (GAUZE/BANDAGES/DRESSINGS) IMPLANT
TAPE CLOTH SURG 4X10 WHT LF (GAUZE/BANDAGES/DRESSINGS) ×2 IMPLANT
TOWEL GREEN STERILE (TOWEL DISPOSABLE) ×2 IMPLANT
TOWEL GREEN STERILE FF (TOWEL DISPOSABLE) ×2 IMPLANT
TRAP SPECIMEN MUCOUS 40CC (MISCELLANEOUS) ×2 IMPLANT
WATER STERILE IRR 1000ML POUR (IV SOLUTION) ×2 IMPLANT

## 2017-08-20 NOTE — Progress Notes (Signed)
Orthopedic Tech Progress Note Patient Details:  Courtney [name] Dec 14, 1940 [id number]  Ortho Devices Type of Ortho Device: Soft collar Ortho Device/Splint Location: neck Ortho Device/Splint Interventions: Application   Post Interventions Patient Tolerated: Well Instructions Provided: Care of device   Hildred Priest 08/20/2017, 5:38 PM

## 2017-08-20 NOTE — Brief Op Note (Signed)
08/20/2017  3:46 PM  PATIENT:  Isac Sarna  77 y.o. female  PRE-OPERATIVE DIAGNOSIS:  Stenosis  POST-OPERATIVE DIAGNOSIS:  Stenosis  PROCEDURE:  Procedure(s) with comments: Anterior Cervical Decompression Fusion - Cervical Four - Cervical Five - Cervical Five - Cervical Six (N/A) - Anterior Cervical Decompression Fusion - Cervical Four - Cervical Five - Cervical Five - Cervical Six  SURGEON:  Surgeon(s) and Role:    * Earnie Larsson, MD - Primary  PHYSICIAN ASSISTANT:   ASSISTANTS: Humberto Leep, nurse practitioner  ANESTHESIA:   general  EBL:  100 mL   BLOOD ADMINISTERED:none  DRAINS: none   LOCAL MEDICATIONS USED:  NONE  SPECIMEN:  No Specimen  DISPOSITION OF SPECIMEN:  N/A  COUNTS:  YES  TOURNIQUET:  * No tourniquets in log *  DICTATION: .Dragon Dictation  PLAN OF CARE: Admit to inpatient   PATIENT DISPOSITION:  PACU - hemodynamically stable.   Delay start of Pharmacological VTE agent (>24hrs) due to surgical blood loss or risk of bleeding: yes

## 2017-08-20 NOTE — Anesthesia Preprocedure Evaluation (Addendum)
Anesthesia Evaluation  Patient identified by MRN, date of birth, ID band Patient awake    Reviewed: Allergy & Precautions, NPO status , Patient's Chart, lab work & pertinent test results, reviewed documented beta blocker date and time   History of Anesthesia Complications (+) PONV and history of anesthetic complications  Airway Mallampati: III  TM Distance: >3 FB Neck ROM: Full    Dental  (+) Upper Dentures, Partial Lower   Pulmonary sleep apnea ,  Fibrosis, pulmonary, interstitial, diffuse    Pulmonary exam normal breath sounds clear to auscultation       Cardiovascular hypertension, Pt. on home beta blockers Normal cardiovascular exam Rhythm:Regular Rate:Normal  ECG: SR, 1st degree AV block   Neuro/Psych  Headaches, negative psych ROS   GI/Hepatic Neg liver ROS, hiatal hernia, GERD  Medicated and Controlled,  Endo/Other  diabetes, Insulin Dependent  Renal/GU negative Renal ROS     Musculoskeletal negative musculoskeletal ROS (+)   Abdominal (+) + obese,   Peds  Hematology HLD    Anesthesia Other Findings Cervical stenosis   Reproductive/Obstetrics                            Anesthesia Physical Anesthesia Plan  ASA: III  Anesthesia Plan: General   Post-op Pain Management:    Induction: Intravenous  PONV Risk Score and Plan: 4 or greater and Ondansetron, Propofol infusion and Treatment may vary due to age or medical condition  Airway Management Planned: Oral ETT  Additional Equipment:   Intra-op Plan:   Post-operative Plan: Extubation in OR  Informed Consent: I have reviewed the patients History and Physical, chart, labs and discussed the procedure including the risks, benefits and alternatives for the proposed anesthesia with the patient or authorized representative who has indicated his/her understanding and acceptance.   Dental advisory given  Plan Discussed with:  CRNA  Anesthesia Plan Comments:         Anesthesia Quick Evaluation

## 2017-08-20 NOTE — Anesthesia Procedure Notes (Signed)
Procedure Name: Intubation Date/Time: 08/20/2017 1:23 PM Performed by: Eulas Post, Milinda Sweeney W, CRNA Pre-anesthesia Checklist: Patient identified, Emergency Drugs available, Suction available and Patient being monitored Patient Re-evaluated:Patient Re-evaluated prior to induction Oxygen Delivery Method: Circle system utilized Preoxygenation: Pre-oxygenation with 100% oxygen Induction Type: IV induction Ventilation: Mask ventilation without difficulty Laryngoscope Size: Miller and 2 Grade View: Grade I Tube type: Oral Tube size: 7.0 mm Number of attempts: 1 Airway Equipment and Method: Stylet and Oral airway Placement Confirmation: ETT inserted through vocal cords under direct vision,  positive ETCO2 and breath sounds checked- equal and bilateral Secured at: 22 cm Tube secured with: Tape Dental Injury: Teeth and Oropharynx as per pre-operative assessment

## 2017-08-20 NOTE — Transfer of Care (Signed)
Immediate Anesthesia Transfer of Care Note  Patient: Courtney [name]  Procedure(s) Performed: Anterior Cervical Decompression Fusion - Cervical Four - Cervical Five - Cervical Five - Cervical Six (N/A Spine Cervical)  Patient Location: PACU  Anesthesia Type:General  Level of Consciousness: awake, alert , oriented, drowsy and patient cooperative  Airway & Oxygen Therapy: Patient Spontanous Breathing and Patient connected to nasal cannula oxygen  Post-op Assessment: Report given to RN and Post -op Vital signs reviewed and stable  Post vital signs: Reviewed and stable  Last Vitals:  Vitals Value Taken Time  BP 157/68 08/20/2017  3:47 PM  Temp    Pulse 89 08/20/2017  3:48 PM  Resp 38 08/20/2017  3:48 PM  SpO2 98 % 08/20/2017  3:48 PM  Vitals shown include unvalidated device data.  Last Pain:  Vitals:   08/20/17 0922  PainSc: 0-No pain      Patients Stated Pain Goal: 2 (60/15/61 [id number])  Complications: No apparent anesthesia complications

## 2017-08-20 NOTE — Op Note (Signed)
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  Asst. Surgeon: Arnoldo Morale  Anesthesia: General  Indication: 77 year old female with neck and bilateral upper extremity symptoms right greater than left.  Work-up demonstrates evidence of marked degeneration with associated spondylosis and stenosis with severe neuroforaminal stenosis.  Patient presents now for 2 level anterior decompressive surgery at C4-5 and C5-6 in hopes of improving her symptoms.  Operative note: After induction anesthesia, patient position supine with neck slightly extended held place holder traction.  Patient's anterior cervical region prepped and draped sterilely.  Incision made overlying C5.  Dissection performed on the right.  Retractor placed.  Fluoroscopy use.  Level confirmed.  Disc spaces at C4-5 and C5-6 and then incised.  Disc was removed using various instruments down to level the posterior annulus.  Microscope to run field used throughout the remainder of the discectomy.  Remaining aspects of annulus and osteophytes removed using a high-speed drill down to level the posterior logical limb.  Posterior logical is not elevated and resected piecemeal fashion.  Underlying thecal sac was then identified.  Wide central decompression then performed undercutting the bodies of C4 and C5.  Decompression then proceeded to each neural foramen.  Wide anterior foraminotomies were performed on the course the exiting C5 nerve roots bilaterally.  At this point a very thorough decompression of been achieved.  There was no evidence of injury to thecal sac or nerve roots.  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  Wound was then irrigated fanlike solution.  [employer] anatomic peek cages were packed with locally harvested autograft.  Each cage was then packed in place at both levels.  Each cage was then recessed slightly from the anterior cortical margin.  [employer] anterior cervical plate was then placed over the C4, C5 and C6 levels.  This then attached under fluoroscopic guidance using 13 mm variable angle screws to each at all 3 levels.  Final tightening was achieved.  Final x-rays reveal good position of the cages and the hardware at the proper upper level with normal alignment spine.  Wound was then irrigated one final time.  Hemostasis was assured with bipolar cautery.  Wounds and closed in typical fashion.  Steri-Strips sterile dressing were applied.  No apparent complications.  Patient tolerated procedure well and she returns to recovery room postop.

## 2017-08-20 NOTE — Anesthesia Postprocedure Evaluation (Signed)
Anesthesia Post Note  Patient: Courtney [name]  Procedure(s) Performed: Anterior Cervical Decompression Fusion - Cervical Four - Cervical Five - Cervical Five - Cervical Six (N/A Spine Cervical)     Patient location during evaluation: PACU Anesthesia Type: General Level of consciousness: awake and alert Pain management: pain level controlled Vital Signs Assessment: post-procedure vital signs reviewed and stable Respiratory status: spontaneous breathing, nonlabored ventilation, respiratory function stable and patient connected to nasal cannula oxygen Cardiovascular status: blood pressure returned to baseline and stable Postop Assessment: no apparent nausea or vomiting Anesthetic complications: no    Last Vitals:  Vitals:   08/20/17 1700 08/20/17 1715  BP: (!) 163/67   Pulse: 96 89  Resp: 13 16  Temp:    SpO2: 95% 96%    Last Pain:  Vitals:   08/20/17 1715  PainSc: 6                  Ryan P Ellender

## 2017-08-20 NOTE — H&P (Signed)
Courtney [name] is an 77 y.o. female.   Chief Complaint: Neck pain HPI: 77 year old female with progressive neck pain and bilateral upper extremity symptoms right greater than left.  Patient with significant deltoid and biceps weakness on the right.  Patient with mixed sensory loss involving both C5 and C6 dermatomes bilaterally.  Patient is failed conservative management her work-up demonstrates evidence of marked disc degeneration with associated broad-based disc protrusions, stenosis and severe foraminal stenosis.  Patient presents now for 2 level anterior cervical decompression and fusion.  Past Medical History:  Diagnosis Date  . Arthritis    rheumatoid arthritis  . B12 deficiency   . Cancer ([place])    skin cancer (squamous cell)  . Collagen vascular disease (HCC)    RA  . Dermatitis   . Diabetes mellitus without complication ([place])    type 2  . Eczema   . Fibrosis, pulmonary, interstitial, diffuse ([place])   . GERD (gastroesophageal reflux disease)   . Headache    migraines in the past (none since menopause)  . History of hiatal hernia   . Hives    "chronic"  . Hypertension   . Loose stools   . Osteoarthritis    lumbar spine, cervical spine, plantar fascitis   . Pneumonia   . PONV (postoperative nausea and vomiting)   . Psoriasis   . Rosacea   . Sleep apnea 2004   does not use cpap  . Urethral stenosis    w/ bladder polyps. followed by Dr Madelin Headings   . Urticaria    chronic   . Varicose veins     Past Surgical History:  Procedure Laterality Date  . BILATERAL CARPAL TUNNEL RELEASE    . BREAST CYST ASPIRATION Left    neg  . CHOLECYSTECTOMY  1963  . COLONOSCOPY WITH PROPOFOL N/A 12/31/2015   Procedure: COLONOSCOPY WITH PROPOFOL;  Surgeon: Lollie Sails, MD;  Location: [facility] ENDOSCOPY;  Service: Endoscopy;  Laterality: N/A;  . ENDOMETRIAL ABLATION  1991  . ESOPHAGOGASTRODUODENOSCOPY (EGD) WITH PROPOFOL N/A 12/31/2015   Procedure: ESOPHAGOGASTRODUODENOSCOPY (EGD) WITH  PROPOFOL;  Surgeon: Lollie Sails, MD;  Location: [facility] ENDOSCOPY;  Service: Endoscopy;  Laterality: N/A;  . ESOPHAGOGASTRODUODENOSCOPY (EGD) WITH PROPOFOL N/A 06/02/2016   Procedure: ESOPHAGOGASTRODUODENOSCOPY (EGD) WITH PROPOFOL;  Surgeon: Lollie Sails, MD;  Location: [facility] ENDOSCOPY;  Service: Endoscopy;  Laterality: N/A;  . ESOPHAGOGASTRODUODENOSCOPY (EGD) WITH PROPOFOL N/A 11/07/2016   Procedure: ESOPHAGOGASTRODUODENOSCOPY (EGD) WITH PROPOFOL;  Surgeon: Lollie Sails, MD;  Location: [facility] ENDOSCOPY;  Service: Endoscopy;  Laterality: N/A;  . EYE SURGERY Bilateral 2006 and 2012   cataract surgery with lens implant  . eyelid surgery Bilateral 2014  . FOOT SURGERY Right    ligament and spurs  . LUMBAR LAMINECTOMY/DECOMPRESSION MICRODISCECTOMY Right 12/24/2013   Procedure: RIGHT LUMBAR THREE TO FOUR, LUMBAR FOUR TO FIVE, LUMBAR FIVE TO SACRAL ONE LAMINECTOMY/FORAMINOTOMY;  Surgeon: Floyce Stakes, MD;  Location: [facility] NEURO ORS;  Service: Neurosurgery;  Laterality: Right;  RIGHT L3-4 L4-5 L5-S1 LAMINECTOMY/FORAMINOTOMY  . NASAL SEPTUM SURGERY  2004  . SHOULDER SURGERY Right    for a frozen shoulder  . SKIN CANCER EXCISION Right    leg x 4  . skin cancer removal    . TUBAL LIGATION  1968    Family History  Problem Relation Age of Onset  . COPD Mother   . Arthritis/Rheumatoid Mother   . Alzheimer's disease Father   . Heart attack Father   . Breast cancer Paternal Aunt 50  Social History:  reports that she has never smoked. She has never used smokeless tobacco. She reports that she does not drink alcohol or use drugs.  Allergies:  Allergies  Allergen Reactions  . Methotrexate Derivatives Shortness Of Breath    Breathing difficulties   . Acyclovir And Related Rash  . Codeine Nausea Only  . Contrast Media [Iodinated Diagnostic Agents] Itching and Rash  . Inderide [Propranolol-Hctz] Rash  . Iodine Rash  . Lodine [Etodolac] Rash  . Metrizamide Itching and Rash  .  Procardia [Nifedipine] Rash    Medications Prior to Admission  Medication Sig Dispense Refill  . acetaminophen (TYLENOL) 500 MG tablet Take 500 mg by mouth every 6 (six) hours as needed (for pain.).     Marland Kitchen aspirin EC 81 MG tablet Take 81 mg by mouth daily.    . Biotin 1000 MCG tablet Take 1,000 mcg by mouth daily.    . bisoprolol (ZEBETA) 10 MG tablet Take 1 tablet (10 mg total) by mouth daily. 30 tablet 1  . cetirizine (ZYRTEC) 10 MG tablet Take 10 mg by mouth at bedtime.     . cholecalciferol (VITAMIN D) 1000 units tablet Take 1,000 Units by mouth daily.    . Cyanocobalamin (VITAMIN B-12) 2500 MCG SUBL Place 2,500 mcg under the tongue 3 (three) times a week.    . doxazosin (CARDURA) 8 MG tablet Take 8 mg by mouth at bedtime.     Marland Kitchen HYDROcodone-acetaminophen (NORCO/VICODIN) 5-325 MG tablet Take 0.5 tablets by mouth daily as needed (for pain.).    Marland Kitchen hydrOXYzine (ATARAX/VISTARIL) 25 MG tablet Take 25 mg by mouth daily.     . insulin NPH Human (HUMULIN N,NOVOLIN N) 100 UNIT/ML injection Inject 0.4 mLs (40 Units total) into the skin daily before breakfast. (Patient taking differently: Inject 35-45 Units into the skin See admin instructions. 45 units in the morning & 35 units in the evening.) 10 mL 11  . leflunomide (ARAVA) 10 MG tablet Take 10 mg by mouth daily.    Marland Kitchen lisinopril (PRINIVIL,ZESTRIL) 20 MG tablet Take 20 mg by mouth daily.    . pantoprazole (PROTONIX) 40 MG tablet Take 40 mg by mouth 2 (two) times daily.     . simvastatin (ZOCOR) 40 MG tablet Take 40 mg by mouth at bedtime and may repeat dose one time if needed.     Marland Kitchen spironolactone (ALDACTONE) 25 MG tablet Take 25 mg by mouth daily.    Marland Kitchen inFLIXimab (REMICADE) 100 MG injection Inject 500 mg into the vein every 6 (six) weeks.      Results for orders placed or performed during the hospital encounter of 08/20/17 (from the past 48 hour(s))  Glucose, capillary     Status: Abnormal   Collection Time: 08/20/17  9:05 AM  Result Value Ref  Range   Glucose-Capillary 105 (H) 70 - 99 mg/dL  Glucose, capillary     Status: Abnormal   Collection Time: 08/20/17 11:37 AM  Result Value Ref Range   Glucose-Capillary 134 (H) 70 - 99 mg/dL   No results found.  Pertinent items noted in HPI and remainder of comprehensive ROS otherwise negative.  Blood pressure (!) 187/53, pulse 72, temperature 97.8 F (36.6 C), resp. rate 20, height 5\' 8"  (1.727 m), weight 92.2 kg, SpO2 100 %.  Patient is awake and alert.  She is oriented and appropriate.  Speech is fluent.  Judgment and insight are intact.  Cranial nerve function normal bilateral.  Motor examination 4 5 weakness right  deltoid and infraspinatus.  4+/5 right biceps strength.  Remainder motor strength intact.  Sensory examination with decreased sensation pinprick light touch in her right C5 and C6 dermatomes bilaterally.  Spurling's maneuver positive bilaterally.  No evidence of long track signs.  Reflexes normal.  Gait and posture normal.  Examination head ears eyes nose throat is unremarkable her chest and abdomen are benign.  Extremities are free from injury deformity. 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  Plan C4-5, C5-6 anterior cervical discectomy with interbody fusion utilizing interbody peek cages, locally harvested autograft, and anterior plate instrumentation.  Risks and benefits been explained.  Patient wishes to proceed.  [name] Render Yolander Goodie 08/20/2017, 12:23 PM

## 2017-08-21 LAB — GLUCOSE, CAPILLARY
GLUCOSE-CAPILLARY: 276 mg/dL — AB (ref 70–99)
Glucose-Capillary: 246 mg/dL — ABNORMAL HIGH (ref 70–99)

## 2017-08-21 MED ORDER — CYCLOBENZAPRINE HCL 10 MG PO TABS: 10.0000 mg | ORAL_TABLET | Freq: Three times a day (TID) | ORAL | 0 refills | Status: DC | PRN

## 2017-08-21 MED ORDER — HYDROCODONE-ACETAMINOPHEN 5-325 MG PO TABS: 1.0000 | ORAL_TABLET | ORAL | 0 refills | Status: DC | PRN

## 2017-08-21 NOTE — Progress Notes (Signed)
Patient is discharged from room 3C08 at this time. Alert and in stable condition. IV site d/c'd and instructions read to patient and daughter with understanding verbalized. Left unit via wheelchair with all belongings at side. 

## 2017-08-21 NOTE — Evaluation (Signed)
Occupational Therapy Evaluation and Discharge Patient Details Name: Courtney [name] MRN: [id number] DOB: 01/01/1941 Today's Date: 08/21/2017    History of Present Illness Pt is a 77 y.o. female s/p ACDF C4-5, C5-6. She has a PMH signfiicant for arthritis, B12 deficiency, GERD, hypertension, osteoarthritis, pneumonia, and psoriasis.   Clinical Impression   PTA, pt was independent with occasional use of cane for basic ADL. She requires some assistance from husband for IADL. Pt reports limitations in activity tolerance at baseline and demonstrates a good understanding of energy conservation strategies. She was educated concerning cervical precautions related to ADL participation with handout provided and she demonstrates good understanding. She requires overall supervision for ADL participation at this time. Educated concerning soft collar wear schedule as well. Pt verbalizes and demonstrates understanding of all education topics. She will have 24 hour assistance at home from daughter and husband. No further acute OT needs identified. OT will sign off.     Follow Up Recommendations  No OT follow up;Supervision/Assistance - 24 hour    Equipment Recommendations  None recommended by OT    Recommendations for Other Services       Precautions / Restrictions Precautions Precautions: Cervical Precaution Booklet Issued: Yes (comment) Precaution Comments: Reviewed all cervical precautions related to ADL participation and pt verbalizes and demonstrates understanding.  Required Braces or Orthoses: Cervical Brace Cervical Brace: Soft collar Restrictions Weight Bearing Restrictions: No      Mobility Bed Mobility Overal bed mobility: Needs Assistance Bed Mobility: Rolling;Sidelying to Sit;Sit to Sidelying Rolling: Supervision Sidelying to sit: Supervision     Sit to sidelying: Supervision General bed mobility comments: Supervision for safety with cues for log roll technique. After education,  pt demonstrating good understanding of technique.   Transfers Overall transfer level: Needs assistance   Transfers: Sit to/from Stand Sit to Stand: Supervision         General transfer comment: Supervision for safety.     Balance Overall balance assessment: Mild deficits observed, not formally tested                                         ADL either performed or assessed with clinical judgement   ADL Overall ADL's : Needs assistance/impaired Eating/Feeding: Set up;Sitting   Grooming: Supervision/safety;Standing   Upper Body Bathing: Supervision/ safety;Sitting   Lower Body Bathing: Supervison/ safety;Sit to/from stand   Upper Body Dressing : Supervision/safety;Sitting   Lower Body Dressing: Supervision/safety;Sit to/from stand   Toilet Transfer: Supervision/safety;Ambulation;Comfort height toilet;Grab bars   Toileting- Clothing Manipulation and Hygiene: Supervision/safety;Sit to/from stand   Tub/ Shower Transfer: Supervision/safety;Ambulation;Shower seat;Walk-in shower;Grab bars   Functional mobility during ADLs: Supervision/safety General ADL Comments: Pt educated concerning compensatory strategies related to ADL participation while adhering to cervical precautions as well as home set-up recommendations to maximize safety. Educated concerning soft collar wear schedule.      Vision Patient Visual Report: No change from baseline Vision Assessment?: No apparent visual deficits     Perception     Praxis      Pertinent Vitals/Pain Pain Assessment: Faces Faces Pain Scale: Hurts a little bit Pain Location: neck at incision Pain Descriptors / Indicators: Aching;Sore Pain Intervention(s): Limited activity within patient's tolerance;Monitored during session;Repositioned     Hand Dominance     Extremity/Trunk Assessment Upper Extremity Assessment Upper Extremity Assessment: RUE deficits/detail RUE Deficits / Details: Shoulder pain limiting  active ROM at baseline.  Reports that this resolved initially post-operatively but continues to be sore this morning. '   Lower Extremity Assessment Lower Extremity Assessment: Generalized weakness       Communication Communication Communication: No difficulties   Cognition Arousal/Alertness: Awake/alert Behavior During Therapy: WFL for tasks assessed/performed Overall Cognitive Status: Within Functional Limits for tasks assessed                                     General Comments       Exercises     Shoulder Instructions      Home Living Family/patient expects to be discharged to:: Private residence Living Arrangements: Spouse/significant other;Children Available Help at Discharge: Family;Available 24 hours/day Type of Home: House Home Access: Stairs to enter [employer] of Steps: 2 Entrance Stairs-Rails: Right;Left;Can reach both Home Layout: One level     Bathroom Shower/Tub: Occupational psychologist: Handicapped height     Home Equipment: Environmental consultant - 4 wheels;Cane - single point;Shower seat - built in;Shower seat          Prior Functioning/Environment Level of Independence: Independent with assistive device(s)        Comments: Pt using cane at times for mobility but typically ambulating without AD. Assistance required for IADl but independent with basic ADL.         OT Problem List: Decreased activity tolerance;Impaired balance (sitting and/or standing);Pain;Impaired UE functional use      OT Treatment/Interventions:      OT Goals(Current goals can be found in the care plan section) Acute Rehab OT Goals Patient Stated Goal: to go home today OT Goal Formulation: With patient  OT Frequency:     Barriers to D/C:            Co-evaluation              AM-PAC PT "6 Clicks" Daily Activity     Outcome Measure Help from another person eating meals?: None Help from another person taking care of personal grooming?:  None Help from another person toileting, which includes using toliet, bedpan, or urinal?: None Help from another person bathing (including washing, rinsing, drying)?: None Help from another person to put on and taking off regular upper body clothing?: None Help from another person to put on and taking off regular lower body clothing?: None 6 Click Score: 24   End of Session Equipment Utilized During Treatment: Cervical collar(soft collar) Nurse Communication: Mobility status;Other (comment)(OT evaluation complete)  Activity Tolerance: Patient tolerated treatment well Patient left: in chair;with call bell/phone within reach  OT Visit Diagnosis: Other abnormalities of gait and mobility (R26.89);Pain Pain - Right/Left: Right Pain - part of body: Shoulder(neck)                Time: 8182-9937 OT Time Calculation (min): 19 min Charges:  OT General Charges $OT Visit: 1 Visit OT Evaluation $OT Eval Low Complexity: 1 Low  Norman Herrlich, MS OTR/L  Pager: [place] A Kaleiah Kutzer 08/21/2017, 9:21 AM

## 2017-08-21 NOTE — Discharge Instructions (Signed)

## 2017-08-21 NOTE — Discharge Summary (Signed)
Physician Discharge Summary  Patient ID: Courtney [name] MRN: [id number] DOB/AGE: 1940/04/19 77 y.o.  Admit date: 08/20/2017 Discharge date: 08/21/2017  Admission Diagnoses:  Discharge Diagnoses:  Active Problems:   Cervical radiculopathy   Discharged Condition: good  Hospital Course: Patient been to the hospital where she underwent uncomplicated 2 level anterior cervical decompression and fusion.  Postoperatively doing well.  Preoperative neck and upper extremity pain much improved.  Ambulating without difficulty.  Ready for discharge home.  Consults:   Significant Diagnostic Studies:   Treatments:   Discharge Exam: Blood pressure (!) 121/49, pulse 70, temperature 97.8 F (36.6 C), temperature source Oral, resp. rate 18, height 5\' 8"  (1.727 m), weight 92.2 kg, SpO2 100 %. Awake and alert.  Oriented and appropriate.  Cranial nerve function intact.  Motor 5/5 bilateral.  Sensory exam intact.  Wound clean and dry.  Chest and abdomen benign.  Disposition: Discharge disposition: 01-Home or Self Care        Allergies as of 08/21/2017      Reactions   Methotrexate Derivatives Shortness Of Breath   Breathing difficulties    Acyclovir And Related Rash   Codeine Nausea Only   Contrast Media [iodinated Diagnostic Agents] Itching, Rash   Inderide [propranolol-hctz] Rash   Iodine Rash   Lodine [etodolac] Rash   Metrizamide Itching, Rash   Procardia [nifedipine] Rash      Medication List    TAKE these medications   acetaminophen 500 MG tablet Commonly known as:  TYLENOL Take 500 mg by mouth every 6 (six) hours as needed (for pain.).   aspirin EC 81 MG tablet Take 81 mg by mouth daily.   Biotin 1000 MCG tablet Take 1,000 mcg by mouth daily.   bisoprolol 10 MG tablet Commonly known as:  ZEBETA Take 1 tablet (10 mg total) by mouth daily.   cetirizine 10 MG tablet Commonly known as:  ZYRTEC Take 10 mg by mouth at bedtime.   cholecalciferol 1000 units  tablet Commonly known as:  VITAMIN D Take 1,000 Units by mouth daily.   cyclobenzaprine 10 MG tablet Commonly known as:  FLEXERIL Take 1 tablet (10 mg total) by mouth 3 (three) times daily as needed for muscle spasms.   doxazosin 8 MG tablet Commonly known as:  CARDURA Take 8 mg by mouth at bedtime.   HYDROcodone-acetaminophen 5-325 MG tablet Commonly known as:  NORCO/VICODIN Take 0.5 tablets by mouth daily as needed (for pain.). What changed:  Another medication with the same name was added. Make sure you understand how and when to take each.   HYDROcodone-acetaminophen 5-325 MG tablet Commonly known as:  NORCO/VICODIN Take 1 tablet by mouth every 4 (four) hours as needed for moderate pain ((score 4 to 6)). What changed:  You were already taking a medication with the same name, and this prescription was added. Make sure you understand how and when to take each.   hydrOXYzine 25 MG tablet Commonly known as:  ATARAX/VISTARIL Take 25 mg by mouth daily.   inFLIXimab 100 MG injection Commonly known as:  REMICADE Inject 500 mg into the vein every 6 (six) weeks.   insulin NPH Human 100 UNIT/ML injection Commonly known as:  HUMULIN N,NOVOLIN N Inject 0.4 mLs (40 Units total) into the skin daily before breakfast. What changed:    how much to take  when to take this  additional instructions   leflunomide 10 MG tablet Commonly known as:  ARAVA Take 10 mg by mouth daily.   lisinopril  20 MG tablet Commonly known as:  PRINIVIL,ZESTRIL Take 20 mg by mouth daily.   pantoprazole 40 MG tablet Commonly known as:  PROTONIX Take 40 mg by mouth 2 (two) times daily.   simvastatin 40 MG tablet Commonly known as:  ZOCOR Take 40 mg by mouth at bedtime and may repeat dose one time if needed.   spironolactone 25 MG tablet Commonly known as:  ALDACTONE Take 25 mg by mouth daily.   Vitamin B-12 2500 MCG Subl Place 2,500 mcg under the tongue 3 (three) times a week.         Signed: [name] Render Annelle Behrendt 08/21/2017, 11:51 AM

## 2017-08-24 ENCOUNTER — Encounter (HOSPITAL_COMMUNITY): Payer: Self-pay | Admitting: Neurosurgery

## 2017-10-15 DIAGNOSIS — M5090 Cervical disc disorder, unspecified, unspecified cervical region: Secondary | ICD-10-CM | POA: Insufficient documentation

## 2017-10-15 DIAGNOSIS — Z0000 Encounter for general adult medical examination without abnormal findings: Secondary | ICD-10-CM | POA: Insufficient documentation

## 2018-03-05 ENCOUNTER — Other Ambulatory Visit: Payer: Self-pay | Admitting: Internal Medicine

## 2018-03-05 DIAGNOSIS — Z1231 Encounter for screening mammogram for malignant neoplasm of breast: Secondary | ICD-10-CM

## 2018-03-13 ENCOUNTER — Ambulatory Visit
Admission: RE | Admit: 2018-03-13 | Discharge: 2018-03-13 | Disposition: A | Discharge status: Home / Self Care | Payer: Medicare Other | Source: Ambulatory Visit | Attending: Internal Medicine | Admitting: Internal Medicine

## 2018-03-13 DIAGNOSIS — Z1231 Encounter for screening mammogram for malignant neoplasm of breast: Secondary | ICD-10-CM | POA: Diagnosis present

## 2018-10-02 ENCOUNTER — Other Ambulatory Visit: Payer: Self-pay | Admitting: Rheumatology

## 2018-10-02 DIAGNOSIS — M5417 Radiculopathy, lumbosacral region: Secondary | ICD-10-CM

## 2018-10-12 ENCOUNTER — Ambulatory Visit
Admission: RE | Admit: 2018-10-12 | Discharge: 2018-10-12 | Disposition: A | Discharge status: Home / Self Care | Payer: Medicare Other | Source: Ambulatory Visit | Attending: Rheumatology | Admitting: Rheumatology

## 2018-10-12 ENCOUNTER — Other Ambulatory Visit: Payer: Self-pay

## 2018-10-12 DIAGNOSIS — M5417 Radiculopathy, lumbosacral region: Secondary | ICD-10-CM | POA: Diagnosis present

## 2019-01-07 ENCOUNTER — Encounter: Payer: Self-pay | Admitting: Physical Therapy

## 2019-01-07 ENCOUNTER — Ambulatory Visit: Payer: Medicare Other | Attending: Physician Assistant | Admitting: Physical Therapy

## 2019-01-07 ENCOUNTER — Other Ambulatory Visit: Payer: Self-pay

## 2019-01-07 DIAGNOSIS — R42 Dizziness and giddiness: Secondary | ICD-10-CM

## 2019-01-07 DIAGNOSIS — H8112 Benign paroxysmal vertigo, left ear: Secondary | ICD-10-CM | POA: Diagnosis present

## 2019-01-07 NOTE — Therapy (Signed)
[place] MAIN [facility] SERVICES [address] [place], [place], [address] Phone: [phone]   Fax:  [phone]  Physical Therapy Evaluation  Patient Details  Name: Courtney [name] MRN: [id number] Date of Birth: 1940/05/06 Referring Provider (PT): Brett Fairy, [place]   Encounter Date: 01/07/2019  PT End of Session - 01/07/19 1257    Visit Number  1    Number of Visits  13    Date for PT Re-Evaluation  04/01/19    PT Start Time  [id number]    PT Stop Time  1354    PT Time Calculation (min)  56 min    Equipment Utilized During Treatment  Gait belt    Activity Tolerance  Patient tolerated treatment well    Behavior During Therapy  [facility] for tasks assessed/performed       Past Medical History:  Diagnosis Date  . Arthritis    rheumatoid arthritis  . B12 deficiency   . Cancer ([place])    skin cancer (squamous cell)  . Collagen vascular disease (HCC)    RA  . Dermatitis   . Diabetes mellitus without complication ([place])    type 2  . Eczema   . 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 GERD (gastroesophageal reflux disease)   . Headache    migraines in the past (none since menopause)  . History of hiatal hernia   . Hives    "chronic"  . Hypertension   . Loose stools   . Osteoarthritis    lumbar spine, cervical spine, plantar fascitis   . Pneumonia   . PONV (postoperative nausea and vomiting)   . Psoriasis   . Rosacea   . Sleep apnea 2004   does not use cpap  . Urethral stenosis    w/ bladder polyps. followed by Dr Madelin Headings   . Urticaria    chronic   . Varicose veins     Past Surgical History:  Procedure Laterality Date  . ANTERIOR CERVICAL DECOMP/DISCECTOMY FUSION N/A 08/20/2017   Procedure: Anterior Cervical Decompression Fusion - Cervical Four - Cervical Five - Cervical Five - Cervical Six;  Surgeon: Earnie Larsson, MD;  Location: [place];  Service: Neurosurgery;  Laterality: N/A;  Anterior Cervical Decompression Fusion -  Cervical Four - Cervical Five - Cervical Five - Cervical Six  . BILATERAL CARPAL TUNNEL RELEASE    . BREAST CYST ASPIRATION Left    neg  . CHOLECYSTECTOMY  1963  . COLONOSCOPY WITH PROPOFOL N/A 12/31/2015   Procedure: COLONOSCOPY WITH PROPOFOL;  Surgeon: Lollie Sails, MD;  Location: [facility] ENDOSCOPY;  Service: Endoscopy;  Laterality: N/A;  . ENDOMETRIAL ABLATION  1991  . ESOPHAGOGASTRODUODENOSCOPY (EGD) WITH PROPOFOL N/A 12/31/2015   Procedure: ESOPHAGOGASTRODUODENOSCOPY (EGD) WITH PROPOFOL;  Surgeon: Lollie Sails, MD;  Location: [facility] ENDOSCOPY;  Service: Endoscopy;  Laterality: N/A;  . ESOPHAGOGASTRODUODENOSCOPY (EGD) WITH PROPOFOL N/A 06/02/2016   Procedure: ESOPHAGOGASTRODUODENOSCOPY (EGD) WITH PROPOFOL;  Surgeon: Lollie Sails, MD;  Location: [facility] ENDOSCOPY;  Service: Endoscopy;  Laterality: N/A;  . ESOPHAGOGASTRODUODENOSCOPY (EGD) WITH PROPOFOL N/A 11/07/2016   Procedure: ESOPHAGOGASTRODUODENOSCOPY (EGD) WITH PROPOFOL;  Surgeon: Lollie Sails, MD;  Location: [facility] ENDOSCOPY;  Service: Endoscopy;  Laterality: N/A;  . EYE SURGERY Bilateral 2006 and 2012   cataract surgery with lens implant  . eyelid surgery Bilateral 2014  . FOOT SURGERY Right    ligament and spurs  . LUMBAR LAMINECTOMY/DECOMPRESSION MICRODISCECTOMY Right 12/24/2013   Procedure: RIGHT LUMBAR THREE TO FOUR, LUMBAR FOUR  TO FIVE, LUMBAR FIVE TO SACRAL ONE LAMINECTOMY/FORAMINOTOMY;  Surgeon: Floyce Stakes, MD;  Location: [facility] NEURO ORS;  Service: Neurosurgery;  Laterality: Right;  RIGHT L3-4 L4-5 L5-S1 LAMINECTOMY/FORAMINOTOMY  . NASAL SEPTUM SURGERY  2004  . SHOULDER SURGERY Right    for a frozen shoulder  . SKIN CANCER EXCISION Right    leg x 4  . skin cancer removal    . TUBAL LIGATION  1968    There were no vitals filed for this visit.   [facility] PT Assessment - 01/08/19 1227      Assessment   Medical Diagnosis  Dizziness and giddiness    Referring Provider (PT)  Brett Fairy, PA    Onset  Date/Surgical Date  --   October 2020   Prior Therapy  none      Precautions   Precautions  Fall      Restrictions   Weight Bearing Restrictions  No      Balance Screen   Has the patient fallen in the past 6 months  No    Has the patient had a decrease in activity level because of a fear of falling?   No    Is the patient reluctant to leave their home because of a fear of falling?   No      Home Film/video editor residence    Living Arrangements  Spouse/significant other    Available Help at Discharge  Family    Type of [place]      Prior Function   Level of [place] with community mobility without device      Cognition   Overall Cognitive Status  Within Functional Limits for tasks assessed        VESTIBULAR AND BALANCE EVALUATION   HISTORY:  Subjective history of current problem: Patient states she has back problems and has an appointment with a pain doctor in a month. Patient states she has been having to roll to her left due to her back pain but then she gets dizziness. Patient states the back pain has been going on since August and the dizziness began at the end of October. Patient reports the dizziness got so bad that she was afraid she would fall. Patient reports she has had ruptured disks in her neck and has had a neck fusion surgery. Patient was seen at the ENT physician office on 11/23 and had a VNG performed which revealed positive left BPPV and an Epley maneuvers were performed per MR. Patient returned to the clinic for f/u Epley maneuvers on 12/2. Patient was then referred for vestibular therapy. Patient reports she felt a little better a few days after the last Epley treatments on 12/2, but that she has continued to get multiple daily episodes of vertigo. Patient describes her dizziness as vertigo, unsteadiness that lasts seconds. Patient reports that when she first started to have vertigo episodes in October she was getting  multiple daily episodes but now it has decreased in frequency to about 3 episodes each day. Patient reports that quick movements and head turns, bending over and looking up, rolling over in bed all provoke her symptoms. Patient reports that her dizziness is worse with bending over and rolling over in bed especially when she turns to her left side. Patient reports staying still help relieve her symptoms. Patient symptoms are motion provoked and positional in nature. Patient reports she is afraid of falling and therefore sought medical help for her  symptoms.  Progression of symptoms: better History of similar episodes:no  Falls (yes/no): no Number of falls in past 6 months: 0  Auditory complaints (tinnitus, pain, drainage): denies Vision (last eye exam, diplopia, recent changes): denies    EXAMINATION  POSTURE:  Rounded shoulders, kyphotic  SOMATOSENSORY:  Any N & T in extremities or weakness: denies      COORDINATION: Deferred secondary to positive Left BPPV   MUSCULOSKELETAL SCREEN: Cervical Spine ROM: cervical AROM WFL left rotation decreased as compared to right   Gait: Patient arrives ambulating without AD. Patient ambulates with decreased step length and height, decreased scanning of visual environment with decreased gait speed.  Balance: TBA once BPPV cleared; appears to be mildly unsteady walking into clinic.  POSTURAL CONTROL TESTS:  Clinical Test of Sensory Interaction for Balance (CTSIB): deferred secondary to positive L BPPV   OCULOMOTOR / VESTIBULAR TESTING:  Oculomotor Exam- Room Light  Normal Abnormal Comments  Ocular Alignment N    Ocular ROM   Deferred secondary to + Left BPPV  Spontaneous Nystagmus   Deferred secondary to + Left BPPV  Gaze evoked Nystagmus   Deferred secondary to + Left BPPV  Smooth Pursuit   Deferred secondary to + Left BPPV  Saccades   Deferred secondary to + Left BPPV  VOR   Deferred secondary to + Left BPPV  VOR Cancellation    Deferred secondary to + Left BPPV  Left Head Impulse   Deferred secondary to + Left BPPV  Right Head Impulse   Deferred secondary to + Left BPPV    BPPV TESTS:  Symptoms Duration Intensity Nystagmus  Left Dix-Hallpike Vertigo; mild nausea About 20-30 seconds 5/10 Upbeating left torsional nystagmus of short duration  Right Dix-Hallpike denies  N/A None observed    FUNCTIONAL OUTCOME MEASURES:  Results Comments  DHI    44/100 Moderate perception of handicap; in need of intervention  ABC Scale       38.7% Falls risk; in need of intervention  DGI      TBA Deferred this date secondary to + BPPV   Canalith Repositioning Manuever: On mat table, performed left Dix-Hallpike testing and it was positive with left upbeating torsional nystagmus of short duration after a few second latency. Performed left canalith repositioning maneuver (CRT). Repeated left CRT for a total of 3 maneuvers with retesting between maneuvers. Utilized one minute holds in each Epley maneuver position.  Patient with kyphotic posture making it difficult for her to lie flat with head extended. Will try inverted mat table next session.  Patient reported 5/10 vertigo with first, 8/10 vertigo with second maneuver and 2/10 with last maneuver.  Performed right Dix-Hallpike test and it was negative with no nystagmus observed and patient denied vertigo.     PT Education - 01/07/19 1257    Education Details  discussed plan of care, BPPV and Epley maneuver; provided BPPV handout from [email or web address].org    Person(s) Educated  Patient    Methods  Explanation;Handout;Demonstration;Verbal cues    Comprehension  Verbalized understanding;Returned demonstration;Verbal cues required       PT Short Term Goals - 01/07/19 1258      PT SHORT TERM GOAL #1   Title  Patient will report 50% or greater improvement in her symptoms of dizziness and imbalance with provoking motions or positions    Time  4    Period  Weeks    Status  New    Target  Date  02/04/19  PT Long Term Goals - 01/07/19 1258      PT LONG TERM GOAL #1   Title  Patient reports no vertigo with provoking motions or positions.    Time  12    Period  Weeks    Status  New    Target Date  04/01/19      PT LONG TERM GOAL #2   Title  Patient will have demonstrate decreased falls risk as indicated by Activities Specific Balance Confidence Scale score of 67% or greater.    Baseline  scored 38.7% on 01/07/19    Time  12    Period  Weeks    Status  New    Target Date  04/01/19      PT LONG TERM GOAL #3   Title  Patient will report 75% or greater improvement in her symptoms of dizziness and imbalance with provoking motions or positions    Time  12    Period  Weeks    Status  New    Target Date  04/01/19      PT LONG TERM GOAL #4   Title  Patient will reduce perceived disability to low levels as indicated by <40 on Dizziness Handicap Inventory.    Baseline  scored 44/100 on 01/07/19    Time  12    Period  Weeks    Status  New    Target Date  04/01/19             Plan - 01/07/19 1435    Clinical Impression Statement  Patient presents to clinic with report of daily episodes of vertigo that began in late October 2021. Patient was seen at ENT physician office and diagnoses with BPPV and had several Epley maneuvers performed per MR. Patient reports that her symptoms have improved but that she is still experiencing about 3 episodes per day of vertigo. Patient reports that she is afraid of falling and therefore has sought medical help for her symptoms of vertigo and unsteadiness. Patient found to have positive left BPPV this date and performed 3 Epley maneuvers. Patient scored 38.7% on the ABC scale indicating high falls risk and scored moderate perception of handical 44/100 on the [place]. Patient would benefit from PT servies to address goals and functional deficits as set on plan of care.    Personal Factors and Comorbidities  Comorbidity 3+     Comorbidities  DM, cervical radiculopathy, DDD, sleep apnea, HA, HTN    Examination-Activity Limitations  Bed Mobility    Stability/Clinical Decision Making  Evolving/Moderate complexity    Clinical Decision Making  Moderate    Rehab Potential  Good    PT Frequency  1x / week    PT Duration  12 weeks    PT Treatment/Interventions  Canalith Repostioning;Balance training;Therapeutic exercise;Therapeutic activities;Stair training;Gait training;Patient/family education;Neuromuscular re-education;Vestibular    Consulted and Agree with Plan of Care  Patient       Patient will benefit from skilled therapeutic intervention in order to improve the following deficits and impairments:  Decreased strength, Decreased balance, Difficulty walking, Dizziness  Visit Diagnosis: Dizziness and giddiness - Plan: PT plan of care cert/re-cert  BPPV (benign paroxysmal positional vertigo), left - Plan: PT plan of care cert/re-cert     Problem List Patient Active Problem List   Diagnosis Date Noted  . Cervical radiculopathy 08/20/2017  . AKI (acute kidney injury) ([place])   . Surgery, elective   . History of lumbar surgery   . Post-operative pain   .  Thrombocytopenia ([place])   . Type 2 diabetes mellitus without complication, without long-term current use of insulin ([place])   . Lumbar spondylosis 02/02/2015  . 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 Status post lumbar surgery 09/29/2014  . DDD (degenerative disc disease), lumbar 07/02/2014  . Facet syndrome, lumbar 07/02/2014  . Sacroiliac joint dysfunction 07/02/2014  . Lumbar degenerative disc disease 12/24/2013   Lady Deutscher PT, DPT [id number] Lady Deutscher 01/08/2019, 12:43 PM  [place] MAIN [facility] SERVICES [address] [place], [place], [address] Phone: [phone]   Fax:  [phone]  Name: LATARRA MAHE MRN: [id number] Date of Birth: 10-26-1940

## 2019-01-14 ENCOUNTER — Encounter: Payer: Medicare Other | Admitting: Physical Therapy

## 2019-01-21 ENCOUNTER — Other Ambulatory Visit: Payer: Self-pay

## 2019-01-21 ENCOUNTER — Encounter: Payer: Self-pay | Admitting: Physical Therapy

## 2019-01-21 ENCOUNTER — Ambulatory Visit: Payer: Medicare Other | Attending: Physician Assistant | Admitting: Physical Therapy

## 2019-01-21 DIAGNOSIS — H8112 Benign paroxysmal vertigo, left ear: Secondary | ICD-10-CM | POA: Diagnosis present

## 2019-01-21 DIAGNOSIS — R42 Dizziness and giddiness: Secondary | ICD-10-CM | POA: Diagnosis not present

## 2019-01-21 NOTE — Therapy (Signed)
[place] MAIN [facility] SERVICES [address] [place], [place], [address] Phone: [phone]   Fax:  [phone]  Physical Therapy Treatment  Patient Details  Name: Courtney [name] MRN: [id number] Date of Birth: 16-Sep-1940 Referring Provider (PT): Brett Fairy, [place]   Encounter Date: 01/21/2019  PT End of Session - 01/21/19 1259    Visit Number  2    Number of Visits  13    Date for PT Re-Evaluation  04/01/19    PT Start Time  [id number]    PT Stop Time  1335    PT Time Calculation (min)  36 min    Activity Tolerance  Patient tolerated treatment well    Behavior During Therapy  [facility] for tasks assessed/performed       Past Medical History:  Diagnosis Date  . Arthritis    rheumatoid arthritis  . B12 deficiency   . Cancer ([place])    skin cancer (squamous cell)  . Collagen vascular disease (HCC)    RA  . Dermatitis   . Diabetes mellitus without complication ([place])    type 2  . Eczema   . Fibrosis, pulmonary, interstitial, diffuse ([place])   . GERD (gastroesophageal reflux disease)   . Headache    migraines in the past (none since menopause)  . History of hiatal hernia   . Hives    "chronic"  . Hypertension   . Loose stools   . Osteoarthritis    lumbar spine, cervical spine, plantar fascitis   . Pneumonia   . PONV (postoperative nausea and vomiting)   . Psoriasis   . Rosacea   . Sleep apnea 2004   does not use cpap  . Urethral stenosis    w/ bladder polyps. followed by Dr Madelin Headings   . Urticaria    chronic   . Varicose veins     Past Surgical History:  Procedure Laterality Date  . ANTERIOR CERVICAL DECOMP/DISCECTOMY FUSION N/A 08/20/2017   Procedure: Anterior Cervical Decompression Fusion - Cervical Four - Cervical Five - Cervical Five - Cervical Six;  Surgeon: Earnie Larsson, MD;  Location: [place];  Service: Neurosurgery;  Laterality: N/A;  Anterior Cervical Decompression Fusion - Cervical Four - Cervical Five - Cervical Five - Cervical  Six  . BILATERAL CARPAL TUNNEL RELEASE    . BREAST CYST ASPIRATION Left    neg  . CHOLECYSTECTOMY  1963  . COLONOSCOPY WITH PROPOFOL N/A 12/31/2015   Procedure: COLONOSCOPY WITH PROPOFOL;  Surgeon: Lollie Sails, MD;  Location: [facility] ENDOSCOPY;  Service: Endoscopy;  Laterality: N/A;  . ENDOMETRIAL ABLATION  1991  . ESOPHAGOGASTRODUODENOSCOPY (EGD) WITH PROPOFOL N/A 12/31/2015   Procedure: ESOPHAGOGASTRODUODENOSCOPY (EGD) WITH PROPOFOL;  Surgeon: Lollie Sails, MD;  Location: [facility] ENDOSCOPY;  Service: Endoscopy;  Laterality: N/A;  . ESOPHAGOGASTRODUODENOSCOPY (EGD) WITH PROPOFOL N/A 06/02/2016   Procedure: ESOPHAGOGASTRODUODENOSCOPY (EGD) WITH PROPOFOL;  Surgeon: Lollie Sails, MD;  Location: [facility] ENDOSCOPY;  Service: Endoscopy;  Laterality: N/A;  . ESOPHAGOGASTRODUODENOSCOPY (EGD) WITH PROPOFOL N/A 11/07/2016   Procedure: ESOPHAGOGASTRODUODENOSCOPY (EGD) WITH PROPOFOL;  Surgeon: Lollie Sails, MD;  Location: [facility] ENDOSCOPY;  Service: Endoscopy;  Laterality: N/A;  . EYE SURGERY Bilateral 2006 and 2012   cataract surgery with lens implant  . eyelid surgery Bilateral 2014  . FOOT SURGERY Right    ligament and spurs  . LUMBAR LAMINECTOMY/DECOMPRESSION MICRODISCECTOMY Right 12/24/2013   Procedure: RIGHT LUMBAR THREE TO FOUR, LUMBAR FOUR TO FIVE, LUMBAR FIVE TO SACRAL ONE LAMINECTOMY/FORAMINOTOMY;  Surgeon:  Floyce Stakes, MD;  Location: [place] NEURO ORS;  Service: Neurosurgery;  Laterality: Right;  RIGHT L3-4 L4-5 L5-S1 LAMINECTOMY/FORAMINOTOMY  . NASAL SEPTUM SURGERY  2004  . SHOULDER SURGERY Right    for a frozen shoulder  . SKIN CANCER EXCISION Right    leg x 4  . skin cancer removal    . TUBAL LIGATION  1968    There were no vitals filed for this visit.  Subjective Assessment - 01/21/19 1258    Subjective  Patient states said she had a bad episode this last week on Thursday, where she woke up with dizziness and went to the restroom and she had dry heaves. Patient reports  she was staggering and unsteady on Sunday and Monday this past week. Patient states she had less dizziness when she would lie down this past week, but reports she would get dizziness when she would sit up.    Pertinent History  Patient states she has back problems and has an appointment with a pain doctor in a month. Patient states she has been having to roll to her left due to her back pain but then she gets dizziness. Patient states the back pain has been going on since August and the dizziness began at the end of October. Patient reports the dizziness got so bad that she was afraid she would fall. Patient reports she has had ruptured disks in her neck and has had a neck fusion surgery. Patient was seen at the ENT physician office on 11/23 and had a VNG performed which revealed positive left BPPV and an Epley maneuvers were performed per MR. Patient returned to the clinic for f/u Epley maneuvers on 12/2. Patient was then referred for vestibular therapy. Patient reports she felt a little better a few days after the last Epley treatments on 12/2, but that she has continued to get multiple daily episodes of vertigo. Patient describes her dizziness as vertigo, unsteadiness that lasts seconds. Patient reports that when she first started to have vertigo episodes in October she was getting multiple daily episodes but now it has decreased in frequency to about 3 episodes each day. Patient reports that quick movements and head turns, bending over and looking up, rolling over in bed all provoke her symptoms. Patient reports that her dizziness is worse with bending over and rolling over in bed especially when she turns to her left side. Patient reports staying still help relieve her symptoms. Patient symptoms are motion provoked and positional in nature. Patient reports she is afraid of falling and therefore sought medical help for her symptoms.    Diagnostic tests  VNG-revealed BPPV per MR       Canalith Repositioning  Manuever: Patient with limited cervical extension ROM and with rounded thoracic curvature, therefore attempted testing on inclined mat table. On inverted mat table, performed left Dix-Hallpike testing and could not get patient's head in correct position for good test accuracy. Patient denied vertigo and no nystagmus was observed. Therefore, attempted sidelying test. Performed left sidelying test and no nystagmus was observed and patient denied vertigo, but performed two Liberatory maneuvers since patient had reported that she had had vertiginous episode this past week.   After repositioning maneuver, performed oculomotor testing.   OCULOMOTOR / VESTIBULAR TESTING:  Oculomotor Exam- Room Light  Normal Abnormal Comments  Ocular Alignment N    Ocular ROM N    Spontaneous Nystagmus N    Gaze evoked Nystagmus  Abn Noted nystagmus at end range with right  field vision could be age appropriate  Smooth Pursuit  Abn Noted multiple saccades  Saccades  Abn Mild hypometric saccades noted and slower speed all planes  VOR N  Denies dizziness with 3 30 second trials with verbal cuing for technique  VOR Cancellation N    Left Head Impulse   deferred  Right Head Impulse   deferred       PT Education - 01/21/19 1258    Education Details  discussed Liberatory maneuver and plan of care; showed patient YouTube video of what nystagmus looks like with positive BPPV    Person(s) Educated  Patient    Methods  Explanation;Demonstration;Verbal cues    Comprehension  Verbalized understanding;Returned demonstration       PT Short Term Goals - 01/07/19 1258      PT SHORT TERM GOAL #1   Title  Patient will report 50% or greater improvement in her symptoms of dizziness and imbalance with provoking motions or positions    Time  4    Period  Weeks    Status  New    Target Date  02/04/19        PT Long Term Goals - 01/07/19 1258      PT LONG TERM GOAL #1   Title  Patient reports no  vertigo with provoking motions or positions.    Time  12    Period  Weeks    Status  New    Target Date  04/01/19      PT LONG TERM GOAL #2   Title  Patient will have demonstrate decreased falls risk as indicated by Activities Specific Balance Confidence Scale score of 67% or greater.    Baseline  scored 38.7% on 01/07/19    Time  12    Period  Weeks    Status  New    Target Date  04/01/19      PT LONG TERM GOAL #3   Title  Patient will report 75% or greater improvement in her symptoms of dizziness and imbalance with provoking motions or positions    Time  12    Period  Weeks    Status  New    Target Date  04/01/19      PT LONG TERM GOAL #4   Title  Patient will reduce perceived disability to low levels as indicated by <40 on Dizziness Handicap Inventory.    Baseline  scored 44/100 on 01/07/19    Time  12    Period  Weeks    Status  New    Target Date  04/01/19            Plan - 01/21/19 1549    Clinical Impression Statement  Patient returns to clinic reporting that she had one vertiginous episode last Thursday and that she was staggering and felt unstead on Sunday and Monday. Patient with negative left Dix-Hallpike and sidelying test this date, but performed two Liberatory maneuvers secondary to patient reporting that she had had an episode of vertigo since last visit. Performed occulomoter testing this date and patient noted to have abnormal saccades, gaze evoked nystagmus that could potentially be age appropriate, abnormal smooth pursuits which could be indicative of a central signs. Patient would benefit from repeat sidelying canal testing next visit and will perform Liberatory maneuver if indicated. Patient would also benefit from continued PT services to further address deficits and to try to improve balance and reduce patietn's falls risk.    Personal Factors and  Comorbidities  Comorbidity 3+    Comorbidities  DM, cervical radiculopathy, DDD, sleep apnea, HA, HTN     Examination-Activity Limitations  Bed Mobility    Stability/Clinical Decision Making  Evolving/Moderate complexity    Rehab Potential  Good    PT Frequency  1x / week    PT Duration  12 weeks    PT Treatment/Interventions  Canalith Repostioning;Balance training;Therapeutic exercise;Therapeutic activities;Stair training;Gait training;Patient/family education;Neuromuscular re-education;Vestibular    Consulted and Agree with Plan of Care  Patient       Patient will benefit from skilled therapeutic intervention in order to improve the following deficits and impairments:  Decreased strength, Decreased balance, Difficulty walking, Dizziness  Visit Diagnosis: Dizziness and giddiness  BPPV (benign paroxysmal positional vertigo), left     Problem List Patient Active Problem List   Diagnosis Date Noted  . Cervical radiculopathy 08/20/2017  . AKI (acute kidney injury) ([place])   . Surgery, elective   . History of lumbar surgery   . Post-operative pain   . Thrombocytopenia ([place])   . Type 2 diabetes mellitus without complication, without long-term current use of insulin ([place])   . Lumbar spondylosis 02/02/2015  . 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 Status post lumbar surgery 09/29/2014  . DDD (degenerative disc disease), lumbar 07/02/2014  . Facet syndrome, lumbar 07/02/2014  . Sacroiliac joint dysfunction 07/02/2014  . Lumbar degenerative disc disease 12/24/2013   Lady Deutscher PT, DPT [phone] Lady Deutscher 01/21/2019, 3:53 PM  [place] MAIN [facility] SERVICES [address] [place], [place], [address] Phone: [phone]   Fax:  [phone]  Name: Courtney [name] MRN: [id number] Date of Birth: May 31, 1940

## 2019-01-28 ENCOUNTER — Ambulatory Visit: Payer: Medicare Other | Admitting: Physical Therapy

## 2019-02-04 ENCOUNTER — Encounter: Payer: Medicare Other | Admitting: Physical Therapy

## 2019-02-18 ENCOUNTER — Encounter: Payer: Self-pay | Admitting: Physical Therapy

## 2019-02-18 ENCOUNTER — Ambulatory Visit: Payer: Medicare Other | Attending: Physician Assistant | Admitting: Physical Therapy

## 2019-02-18 ENCOUNTER — Other Ambulatory Visit: Payer: Self-pay

## 2019-02-18 DIAGNOSIS — H8112 Benign paroxysmal vertigo, left ear: Secondary | ICD-10-CM | POA: Insufficient documentation

## 2019-02-18 DIAGNOSIS — R42 Dizziness and giddiness: Secondary | ICD-10-CM | POA: Diagnosis not present

## 2019-02-18 NOTE — Therapy (Signed)
[place] MAIN [facility] SERVICES [address] [place], [place], [address] Phone: [phone]   Fax:  [phone]  Physical Therapy Treatment  Patient Details  Name: Courtney [name] MRN: [id number] Date of Birth: 02-18-1940 Referring Provider (PT): Brett Fairy, [place]   Encounter Date: 02/18/2019  PT End of Session - 02/18/19 1145    Visit Number  3    Number of Visits  13    Date for PT Re-Evaluation  04/01/19    PT Start Time  1118    PT Stop Time  1144    PT Time Calculation (min)  26 min    Activity Tolerance  Patient tolerated treatment well    Behavior During Therapy  [facility] for tasks assessed/performed       Past Medical History:  Diagnosis Date  . Arthritis    rheumatoid arthritis  . B12 deficiency   . Cancer ([place])    skin cancer (squamous cell)  . Collagen vascular disease (HCC)    RA  . Dermatitis   . Diabetes mellitus without complication ([place])    type 2  . Eczema   . Fibrosis, pulmonary, interstitial, diffuse ([place])   . GERD (gastroesophageal reflux disease)   . Headache    migraines in the past (none since menopause)  . History of hiatal hernia   . Hives    "chronic"  . Hypertension   . Loose stools   . Osteoarthritis    lumbar spine, cervical spine, plantar fascitis   . Pneumonia   . PONV (postoperative nausea and vomiting)   . Psoriasis   . Rosacea   . Sleep apnea 2004   does not use cpap  . Urethral stenosis    w/ bladder polyps. followed by Dr Madelin Headings   . Urticaria    chronic   . Varicose veins     Past Surgical History:  Procedure Laterality Date  . ANTERIOR CERVICAL DECOMP/DISCECTOMY FUSION N/A 08/20/2017   Procedure: Anterior Cervical Decompression Fusion - Cervical Four - Cervical Five - Cervical Five - Cervical Six;  Surgeon: Earnie Larsson, MD;  Location: [facility];  Service: Neurosurgery;  Laterality: N/A;  Anterior Cervical Decompression Fusion - Cervical Four - Cervical Five - Cervical Five - Cervical Six   . BILATERAL CARPAL TUNNEL RELEASE    . BREAST CYST ASPIRATION Left    neg  . CHOLECYSTECTOMY  1963  . COLONOSCOPY WITH PROPOFOL N/A 12/31/2015   Procedure: COLONOSCOPY WITH PROPOFOL;  Surgeon: Lollie Sails, MD;  Location: [facility] ENDOSCOPY;  Service: Endoscopy;  Laterality: N/A;  . ENDOMETRIAL ABLATION  1991  . ESOPHAGOGASTRODUODENOSCOPY (EGD) WITH PROPOFOL N/A 12/31/2015   Procedure: ESOPHAGOGASTRODUODENOSCOPY (EGD) WITH PROPOFOL;  Surgeon: Lollie Sails, MD;  Location: [facility] ENDOSCOPY;  Service: Endoscopy;  Laterality: N/A;  . ESOPHAGOGASTRODUODENOSCOPY (EGD) WITH PROPOFOL N/A 06/02/2016   Procedure: ESOPHAGOGASTRODUODENOSCOPY (EGD) WITH PROPOFOL;  Surgeon: Lollie Sails, MD;  Location: [facility] ENDOSCOPY;  Service: Endoscopy;  Laterality: N/A;  . ESOPHAGOGASTRODUODENOSCOPY (EGD) WITH PROPOFOL N/A 11/07/2016   Procedure: ESOPHAGOGASTRODUODENOSCOPY (EGD) WITH PROPOFOL;  Surgeon: Lollie Sails, MD;  Location: [facility] ENDOSCOPY;  Service: Endoscopy;  Laterality: N/A;  . EYE SURGERY Bilateral 2006 and 2012   cataract surgery with lens implant  . eyelid surgery Bilateral 2014  . FOOT SURGERY Right    ligament and spurs  . LUMBAR LAMINECTOMY/DECOMPRESSION MICRODISCECTOMY Right 12/24/2013   Procedure: RIGHT LUMBAR THREE TO FOUR, LUMBAR FOUR TO FIVE, LUMBAR FIVE TO SACRAL ONE LAMINECTOMY/FORAMINOTOMY;  Surgeon:  Floyce Stakes, MD;  Location: [place] NEURO ORS;  Service: Neurosurgery;  Laterality: Right;  RIGHT L3-4 L4-5 L5-S1 LAMINECTOMY/FORAMINOTOMY  . NASAL SEPTUM SURGERY  2004  . SHOULDER SURGERY Right    for a frozen shoulder  . SKIN CANCER EXCISION Right    leg x 4  . skin cancer removal    . TUBAL LIGATION  1968    There were no vitals filed for this visit.  Subjective Assessment - 02/18/19 1121    Subjective  Patient states she went to the pain clinic last week and got an injection. Patient reports the injection has helped some. Patient reports that she is trying to avoid back  surgery if she can but states she has pain going down right leg to the foot and that she has numbness in the foot. Patient reports that she is getting mild episodes of dizziness especially when she bends over and with quick turns. Patient states that she had mild dizziness for the past 3 years and then began to have the increased dizziness with vertigo several months ago in October 2020. Patient states that now she feels like she is back down to her baseline level of dizziness and that bending over and quick turns increase her dizziness.    Pertinent History  Patient states she has back problems and has an appointment with a pain doctor in a month. Patient states she has been having to roll to her left due to her back pain but then she gets dizziness. Patient states the back pain has been going on since August and the dizziness began at the end of October. Patient reports the dizziness got so bad that she was afraid she would fall. Patient reports she has had ruptured disks in her neck and has had a neck fusion surgery. Patient was seen at the ENT physician office on 11/23 and had a VNG performed which revealed positive left BPPV and an Epley maneuvers were performed per MR. Patient returned to the clinic for f/u Epley maneuvers on 12/2. Patient was then referred for vestibular therapy. Patient reports she felt a little better a few days after the last Epley treatments on 12/2, but that she has continued to get multiple daily episodes of vertigo. Patient describes her dizziness as vertigo, unsteadiness that lasts seconds. Patient reports that when she first started to have vertigo episodes in October she was getting multiple daily episodes but now it has decreased in frequency to about 3 episodes each day. Patient reports that quick movements and head turns, bending over and looking up, rolling over in bed all provoke her symptoms. Patient reports that her dizziness is worse with bending over and rolling over in  bed especially when she turns to her left side. Patient reports staying still help relieve her symptoms. Patient symptoms are motion provoked and positional in nature. Patient reports she is afraid of falling and therefore sought medical help for her symptoms.    Diagnostic tests  VNG-revealed BPPV per MR    Currently in Pain?  Yes    Pain Score  4     Pain Location  Back    Pain Orientation  Right;Lower       Canalith Repositioning Manuever: On mat table, performed left Liberatory maneuver and patient reports mild vertigo and no  nystagmus observed in room light. Assistance of second person was utilized to help perform the maneuvers.  Performed left canalith repositioning maneuver (CRT). Repeated left CRT for a total of 3  maneuvers with retesting between maneuvers. Patient reported 1-2/10 vertigo with first and second maneuver and 0/10 with last maneuver.    PT Education - 02/18/19 1146    Education Details  discussed plan of care; discussed use of SPC for safety with gait-patient plans to bring in her cane next session so can make sure it is adjusted to correct height and to practice gait training as she says she tends to not use the cane because she stoops forward; discussed BPPV and Liberatory maneuver    Person(s) Educated  Patient    Methods  Explanation;Verbal cues    Comprehension  Verbalized understanding       PT Short Term Goals - 01/07/19 1258      PT SHORT TERM GOAL #1   Title  Patient will report 50% or greater improvement in her symptoms of dizziness and imbalance with provoking motions or positions    Time  4    Period  Weeks    Status  New    Target Date  02/04/19        PT Long Term Goals - 01/07/19 1258      PT LONG TERM GOAL #1   Title  Patient reports no vertigo with provoking motions or positions.    Time  12    Period  Weeks    Status  New    Target Date  04/01/19      PT LONG TERM GOAL #2   Title  Patient will have demonstrate decreased falls risk  as indicated by Activities Specific Balance Confidence Scale score of 67% or greater.    Baseline  scored 38.7% on 01/07/19    Time  12    Period  Weeks    Status  New    Target Date  04/01/19      PT LONG TERM GOAL #3   Title  Patient will report 75% or greater improvement in her symptoms of dizziness and imbalance with provoking motions or positions    Time  12    Period  Weeks    Status  New    Target Date  04/01/19      PT LONG TERM GOAL #4   Title  Patient will reduce perceived disability to low levels as indicated by <40 on Dizziness Handicap Inventory.    Baseline  scored 44/100 on 01/07/19    Time  12    Period  Weeks    Status  New    Target Date  04/01/19            Plan - 02/18/19 1147    Clinical Impression Statement  Patient reports that she has had decreased vertigo since last visit but states she does still have some mild dizziness that worsens with quick turns and bending over. Patient reports 1-2/10 vertigo with Liberatory maneuver therefore performed 3 reps to try to ensure clearance of particles. Next session, plan on repeat testing left ear for BPPV and performing Liberaty maneuver if inidcated. Once cleared, plan on working on activities with head and body turns and bending activities as well as practicing ambulation with cane. Patient would benefit from continued PT services to further address goals and functional deficits.    Personal Factors and Comorbidities  Comorbidity 3+    Comorbidities  DM, cervical radiculopathy, DDD, sleep apnea, HA, HTN    Examination-Activity Limitations  Bed Mobility    Stability/Clinical Decision Making  Evolving/Moderate complexity    Rehab Potential  Good  PT Frequency  1x / week    PT Duration  12 weeks    PT Treatment/Interventions  Canalith Repostioning;Balance training;Therapeutic exercise;Therapeutic activities;Stair training;Gait training;Patient/family education;Neuromuscular re-education;Vestibular    Consulted  and Agree with Plan of Care  Patient       Patient will benefit from skilled therapeutic intervention in order to improve the following deficits and impairments:  Decreased strength, Decreased balance, Difficulty walking, Dizziness  Visit Diagnosis: Dizziness and giddiness  BPPV (benign paroxysmal positional vertigo), left     Problem List Patient Active Problem List   Diagnosis Date Noted  . Cervical radiculopathy 08/20/2017  . AKI (acute kidney injury) ([place])   . Surgery, elective   . History of lumbar surgery   . Post-operative pain   . Thrombocytopenia ([place])   . Type 2 diabetes mellitus without complication, without long-term current use of insulin ([place])   . Lumbar spondylosis 02/02/2015  . 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 Status post lumbar surgery 09/29/2014  . DDD (degenerative disc disease), lumbar 07/02/2014  . Facet syndrome, lumbar 07/02/2014  . Sacroiliac joint dysfunction 07/02/2014  . Lumbar degenerative disc disease 12/24/2013   Lady Deutscher PT, DPT [phone] Lady Deutscher 02/18/2019, 11:57 AM  [place] MAIN [facility] SERVICES [address] [place], [place], [address] Phone: [phone]   Fax:  [phone]  Name: Courtney [name] MRN: [id number] Date of Birth: 03-May-1940

## 2019-02-25 ENCOUNTER — Encounter: Payer: Self-pay | Admitting: Physical Therapy

## 2019-02-25 ENCOUNTER — Ambulatory Visit: Payer: Medicare Other | Admitting: Physical Therapy

## 2019-02-25 ENCOUNTER — Other Ambulatory Visit: Payer: Self-pay

## 2019-02-25 DIAGNOSIS — R42 Dizziness and giddiness: Secondary | ICD-10-CM

## 2019-02-25 DIAGNOSIS — H8112 Benign paroxysmal vertigo, left ear: Secondary | ICD-10-CM

## 2019-02-26 NOTE — Therapy (Signed)
[place] [facility] SERVICES [address] [place], [place], [address] Phone: [phone]   Fax:  [phone]  Physical Therapy Treatment  Patient Details  Name: Courtney [name] MRN: [id number] Date of Birth: [birth year] Referring Provider (PT): Brett Fairy, [place]   Encounter Date: 02/25/2019  PT End of Session - 02/25/19 1148    Visit Number  4    Number of Visits  13    Date for PT Re-Evaluation  04/01/19    PT Start Time  1105    PT Stop Time  1146    PT Time Calculation (min)  41 min    Activity Tolerance  Patient tolerated treatment well    Behavior During Therapy  [facility] for tasks assessed/performed       Past Medical History:  Diagnosis Date  . Arthritis    rheumatoid arthritis  . B12 deficiency   . Cancer ([place])    skin cancer (squamous cell)  . Collagen vascular disease (HCC)    RA  . Dermatitis   . Diabetes mellitus without complication ([place])    type 2  . Eczema   . Fibrosis, pulmonary, interstitial, diffuse ([place])   . GERD (gastroesophageal reflux disease)   . Headache    migraines in the past (none since menopause)  . History of hiatal hernia   . Hives    "chronic"  . Hypertension   . Loose stools   . Osteoarthritis    lumbar spine, cervical spine, plantar fascitis   . Pneumonia   . PONV (postoperative nausea and vomiting)   . Psoriasis   . Rosacea   . Sleep apnea 2004   does not use cpap  . Urethral stenosis    w/ bladder polyps. followed by Dr Madelin Headings   . Urticaria    chronic   . Varicose veins     Past Surgical History:  Procedure Laterality Date  . ANTERIOR CERVICAL DECOMP/DISCECTOMY FUSION N/A 08/20/2017   Procedure: Anterior Cervical Decompression Fusion - Cervical Four - Cervical Five - Cervical Five - Cervical Six;  Surgeon: Earnie Larsson, MD;  Location: [place];  Service: Neurosurgery;  Laterality: N/A;  Anterior Cervical Decompression Fusion - Cervical Four - Cervical Five - Cervical Five - Cervical  Six  . BILATERAL CARPAL TUNNEL RELEASE    . BREAST CYST ASPIRATION Left    neg  . CHOLECYSTECTOMY  1963  . COLONOSCOPY WITH PROPOFOL N/A 12/31/2015   Procedure: COLONOSCOPY WITH PROPOFOL;  Surgeon: Lollie Sails, MD;  Location: [facility] ENDOSCOPY;  Service: Endoscopy;  Laterality: N/A;  . ENDOMETRIAL ABLATION  1991  . ESOPHAGOGASTRODUODENOSCOPY (EGD) WITH PROPOFOL N/A 12/31/2015   Procedure: ESOPHAGOGASTRODUODENOSCOPY (EGD) WITH PROPOFOL;  Surgeon: Lollie Sails, MD;  Location: [facility] ENDOSCOPY;  Service: Endoscopy;  Laterality: N/A;  . ESOPHAGOGASTRODUODENOSCOPY (EGD) WITH PROPOFOL N/A 06/02/2016   Procedure: ESOPHAGOGASTRODUODENOSCOPY (EGD) WITH PROPOFOL;  Surgeon: Lollie Sails, MD;  Location: [facility] ENDOSCOPY;  Service: Endoscopy;  Laterality: N/A;  . ESOPHAGOGASTRODUODENOSCOPY (EGD) WITH PROPOFOL N/A 11/07/2016   Procedure: ESOPHAGOGASTRODUODENOSCOPY (EGD) WITH PROPOFOL;  Surgeon: Lollie Sails, MD;  Location: [facility] ENDOSCOPY;  Service: Endoscopy;  Laterality: N/A;  . EYE SURGERY Bilateral 2006 and 2012   cataract surgery with lens implant  . eyelid surgery Bilateral 2014  . FOOT SURGERY Right    ligament and spurs  . LUMBAR LAMINECTOMY/DECOMPRESSION MICRODISCECTOMY Right 12/24/2013   Procedure: RIGHT LUMBAR THREE TO FOUR, LUMBAR FOUR TO FIVE, LUMBAR FIVE TO SACRAL ONE LAMINECTOMY/FORAMINOTOMY;  Surgeon:  Floyce Stakes, MD;  Location: [facility] NEURO ORS;  Service: Neurosurgery;  Laterality: Right;  RIGHT L3-4 L4-5 L5-S1 LAMINECTOMY/FORAMINOTOMY  . NASAL SEPTUM SURGERY  2004  . SHOULDER SURGERY Right    for a frozen shoulder  . SKIN CANCER EXCISION Right    leg x 4  . skin cancer removal    . TUBAL LIGATION  1968    There were no vitals filed for this visit.  Subjective Assessment - 02/25/19 1109    Subjective  Patient states she is having increased foot pain coming from her back. Patient states her dizziness is doing pretty good. Patient states she has not had any episodes  of vertigo since last visit. Patient states she is getting "not much" dizziness but states now she is just cautious because she was having the dizziness for so long. Patient states she is due for her next back injection on Friday 2/26.    Pertinent History  Patient states she has back problems and has an appointment with a pain doctor in a month. Patient states she has been having to roll to her left due to her back pain but then she gets dizziness. Patient states the back pain has been going on since August and the dizziness began at the end of October. Patient reports the dizziness got so bad that she was afraid she would fall. Patient reports she has had ruptured disks in her neck and has had a neck fusion surgery. Patient was seen at the ENT physician office on 11/23 and had a VNG performed which revealed positive left BPPV and an Epley maneuvers were performed per MR. Patient returned to the clinic for f/u Epley maneuvers on 12/2. Patient was then referred for vestibular therapy. Patient reports she felt a little better a few days after the last Epley treatments on 12/2, but that she has continued to get multiple daily episodes of vertigo. Patient describes her dizziness as vertigo, unsteadiness that lasts seconds. Patient reports that when she first started to have vertigo episodes in October she was getting multiple daily episodes but now it has decreased in frequency to about 3 episodes each day. Patient reports that quick movements and head turns, bending over and looking up, rolling over in bed all provoke her symptoms. Patient reports that her dizziness is worse with bending over and rolling over in bed especially when she turns to her left side. Patient reports staying still help relieve her symptoms. Patient symptoms are motion provoked and positional in nature. Patient reports she is afraid of falling and therefore sought medical help for her symptoms.    Diagnostic tests  VNG-revealed BPPV per MR        Canalith Repositioning Manuever: On mat table, performed left LIberatory maneuver and no nystagmus observed and patient reported no vertigo. Performed one maneuver to help ensure clearance of particles.  Neuromuscular Re-education:  Sidelying test for BPPV:  Performed left and right sidelying test and no nystagmus was observed and patient denied vertigo and nausea.    Patient wants to try exercises to improve her balance but patient having difficulty standing due to pain in her right foot on dorsal surface of mid-foot which patient reports is due to her back pain that extends down to her right foot. Patient noted to have mild swelling in right ankle and did not inspect foot as the shoe appeared to be tight and did not want to remove shoe and then be able to re-don the shoe. Patient states  the skin is intact and without redness. Discussed footwear and elevating feet when at rest and how to best position so as to not exacerbate her low back pain. Patient voiced understanding.   Transfer: patient required CGA with transfer Styxis chair to/from mat table about 4' with [place] this date secondary to antalgic gait pattern right LE.   PT Education - 02/26/19 1648    Education Details  discussed BPPV and plan of care; discussed foot wear and recommended elevating lower extremity as patient with mild edema noted at right ankle.    Person(s) Educated  Patient    Methods  Explanation    Comprehension  Verbalized understanding       PT Short Term Goals - 02/25/19 1124      PT SHORT TERM GOAL #1   Title  Patient will report 50% or greater improvement in her symptoms of dizziness and imbalance with provoking motions or positions    Baseline  Patient reports 98% improvement    Time  4    Period  Weeks    Status  Achieved    Target Date  02/04/19        PT Long Term Goals - 02/25/19 1125      PT LONG TERM GOAL #1   Title  Patient reports no vertigo with provoking motions or positions.     Baseline  no vertigo with right and left testing in the clinic this date and patient reports no episodes of vertigo at home this past week    Time  12    Period  Weeks    Status  Achieved      PT LONG TERM GOAL #2   Title  Patient will have demonstrate decreased falls risk as indicated by Activities Specific Balance Confidence Scale score of 67% or greater.    Baseline  scored 38.7% on 01/07/19; scored 54.06% on 02/25/2019    Time  12    Period  Weeks    Status  Partially Met      PT LONG TERM GOAL #3   Title  Patient will report 75% or greater improvement in her symptoms of dizziness and imbalance with provoking motions or positions    Baseline  Patient reports 98% improvement in her symptoms    Time  12    Period  Weeks    Status  Achieved      PT LONG TERM GOAL #4   Title  Patient will reduce perceived disability to low levels as indicated by <40 on Dizziness Handicap Inventory.    Baseline  scored 44/100 on 01/07/19; scored   18 /100 on 02/25/2019    Time  12    Period  Weeks    Status  New            Plan - 02/26/19 1649    Clinical Impression Statement  Patient reports that she has not had any episodes of vertigo since last session and that she has tried to move her head in positions that would have brought on the vertigo to test to see if it has resolved. Patient with negative left and right sidelying tests for BPPV this date. Patient reports that she is 98% improved with her concerns of dizziness and vertigo. Patient does have balance deficits and would like to address these deficits however, she was unable to tolerate standing activities this date due to the radiating right foot pain in patient with chronic low back pain that radiates down right  leg to the foot. Patient educated on positioning and footwear. Patient plans to return to clinic next week to start working on balance deficits if she is better able to tolerate standing activities. Patient would benefit from  continued PT services to address functional deficits.    Personal Factors and Comorbidities  Comorbidity 3+    Comorbidities  DM, cervical radiculopathy, DDD, sleep apnea, HA, HTN    Examination-Activity Limitations  Bed Mobility    Stability/Clinical Decision Making  Evolving/Moderate complexity    Rehab Potential  Good    PT Frequency  1x / week    PT Duration  12 weeks    PT Treatment/Interventions  Canalith Repostioning;Balance training;Therapeutic exercise;Therapeutic activities;Stair training;Gait training;Patient/family education;Neuromuscular re-education;Vestibular    Consulted and Agree with Plan of Care  Patient       Patient will benefit from skilled therapeutic intervention in order to improve the following deficits and impairments:  Decreased strength, Decreased balance, Difficulty walking, Dizziness  Visit Diagnosis: Dizziness and giddiness  BPPV (benign paroxysmal positional vertigo), left     Problem List Patient Active Problem List   Diagnosis Date Noted  . Cervical radiculopathy 08/20/2017  . AKI (acute kidney injury) ([place])   . Surgery, elective   . History of lumbar surgery   . Post-operative pain   . Thrombocytopenia ([place])   . Type 2 diabetes mellitus without complication, without long-term current use of insulin ([place])   . Lumbar spondylosis 02/02/2015  . 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 Status post lumbar surgery 09/29/2014  . DDD (degenerative disc disease), lumbar 07/02/2014  . Facet syndrome, lumbar 07/02/2014  . Sacroiliac joint dysfunction 07/02/2014  . Lumbar degenerative disc disease 12/24/2013   Lady Deutscher PT, DPT [phone] Lady Deutscher 02/26/2019, 5:02 PM  [place] MA[facility] [address] [place], [place], [address] Phone: [phone]   Fax:  [phone]  Name: Courtney [name] MRN: [id number] Date of Birth: Jun 04, 1940

## 2019-03-04 ENCOUNTER — Ambulatory Visit: Payer: Medicare Other | Admitting: Physical Therapy

## 2019-03-18 ENCOUNTER — Encounter: Payer: Medicare Other | Admitting: Physical Therapy

## 2019-04-01 ENCOUNTER — Ambulatory Visit: Payer: Medicare Other | Admitting: Physical Therapy

## 2019-04-29 ENCOUNTER — Other Ambulatory Visit: Payer: Self-pay | Admitting: Internal Medicine

## 2019-04-29 DIAGNOSIS — Z1231 Encounter for screening mammogram for malignant neoplasm of breast: Secondary | ICD-10-CM

## 2019-05-16 ENCOUNTER — Ambulatory Visit
Admission: RE | Admit: 2019-05-16 | Discharge: 2019-05-16 | Disposition: A | Discharge status: Home / Self Care | Payer: Medicare Other | Source: Ambulatory Visit | Attending: Internal Medicine | Admitting: Internal Medicine

## 2019-05-16 DIAGNOSIS — Z1231 Encounter for screening mammogram for malignant neoplasm of breast: Secondary | ICD-10-CM | POA: Diagnosis not present

## 2019-12-22 DIAGNOSIS — G4733 Obstructive sleep apnea (adult) (pediatric): Secondary | ICD-10-CM | POA: Insufficient documentation

## 2020-01-28 DIAGNOSIS — Z8616 Personal history of COVID-19: Secondary | ICD-10-CM | POA: Insufficient documentation

## 2020-04-09 ENCOUNTER — Other Ambulatory Visit: Payer: Self-pay

## 2020-04-09 ENCOUNTER — Emergency Department: Payer: Medicare Other

## 2020-04-09 ENCOUNTER — Emergency Department
Admission: EM | Admit: 2020-04-09 | Discharge: 2020-04-09 | Disposition: A | Discharge status: Home / Self Care | Payer: Medicare Other | Attending: Emergency Medicine | Admitting: Emergency Medicine

## 2020-04-09 DIAGNOSIS — Y92009 Unspecified place in unspecified non-institutional (private) residence as the place of occurrence of the external cause: Secondary | ICD-10-CM | POA: Insufficient documentation

## 2020-04-09 DIAGNOSIS — Y9301 Activity, walking, marching and hiking: Secondary | ICD-10-CM | POA: Diagnosis not present

## 2020-04-09 DIAGNOSIS — W01198A Fall on same level from slipping, tripping and stumbling with subsequent striking against other object, initial encounter: Secondary | ICD-10-CM | POA: Insufficient documentation

## 2020-04-09 DIAGNOSIS — M25552 Pain in left hip: Secondary | ICD-10-CM | POA: Insufficient documentation

## 2020-04-09 DIAGNOSIS — Z7982 Long term (current) use of aspirin: Secondary | ICD-10-CM | POA: Diagnosis not present

## 2020-04-09 DIAGNOSIS — M25551 Pain in right hip: Secondary | ICD-10-CM | POA: Diagnosis not present

## 2020-04-09 DIAGNOSIS — I10 Essential (primary) hypertension: Secondary | ICD-10-CM | POA: Diagnosis not present

## 2020-04-09 DIAGNOSIS — E119 Type 2 diabetes mellitus without complications: Secondary | ICD-10-CM | POA: Diagnosis not present

## 2020-04-09 DIAGNOSIS — S0990XA Unspecified injury of head, initial encounter: Secondary | ICD-10-CM | POA: Insufficient documentation

## 2020-04-09 DIAGNOSIS — Z85828 Personal history of other malignant neoplasm of skin: Secondary | ICD-10-CM | POA: Diagnosis not present

## 2020-04-09 DIAGNOSIS — Z79899 Other long term (current) drug therapy: Secondary | ICD-10-CM | POA: Insufficient documentation

## 2020-04-09 DIAGNOSIS — Z794 Long term (current) use of insulin: Secondary | ICD-10-CM | POA: Diagnosis not present

## 2020-04-09 DIAGNOSIS — W19XXXA Unspecified fall, initial encounter: Secondary | ICD-10-CM

## 2020-04-09 NOTE — Discharge Instructions (Addendum)
Your exam, x-rays, CTs are all negative and reassuring at this time.  No serious injury occurred following your fall at home.  Take over-the-counter Tylenol as needed for headache pain relief.  Probably primary provider for ongoing symptoms, or return to the ED if needed.

## 2020-04-09 NOTE — ED Provider Notes (Signed)
[facility] Emergency Department Provider Note ____________________________________________  Time seen: 1730  I have reviewed the triage vital signs and the nursing notes.  HISTORY  Chief Complaint  Fall  HPI Courtney [name] is a 80 y.o. female resents her self to the ED for evaluation following a mechanical fall.  Patient describes being knocked over by her small dog, while she was walking.  She describes landing over the piece of furniture, and hitting the top of her head on the [place].  She denies any LOC and denies any scalp laceration or bleeding.  Her only other complaint was some mild pain to the hips and buttocks.  She denies any preceding dizziness or weakness.  She presents now without any significant complaint.  Reporting only a mild headache.  No chest pain, shortness of breath, syncope, nausea, vomiting, or chest pains reported.   Past Medical History:  Diagnosis Date  . Arthritis    rheumatoid arthritis  . B12 deficiency   . Cancer ([place])    skin cancer (squamous cell)  . Collagen vascular disease (HCC)    RA  . Dermatitis   . Diabetes mellitus without complication ([place])    type 2  . Eczema   . Fibrosis, pulmonary, interstitial, diffuse ([place])   . GERD (gastroesophageal reflux disease)   . Headache    migraines in the past (none since menopause)  . History of hiatal hernia   . Hives    "chronic"  . Hypertension   . Loose stools   . Osteoarthritis    lumbar spine, cervical spine, plantar fascitis   . Pneumonia   . PONV (postoperative nausea and vomiting)   . Psoriasis   . Rosacea   . Sleep apnea 2004   does not use cpap  . Urethral stenosis    w/ bladder polyps. followed by Dr Madelin Headings   . Urticaria    chronic   . Varicose veins     Patient Active Problem List   Diagnosis Date Noted  . Cervical radiculopathy 08/20/2017  . AKI (acute kidney injury) ([place])   . Surgery, elective   . History of lumbar surgery   . Post-operative  pain   . Thrombocytopenia ([place])   . Type 2 diabetes mellitus without complication, without long-term current use of insulin ([place])   . Lumbar spondylosis 02/02/2015  . 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 Status post lumbar surgery 09/29/2014  . DDD (degenerative disc disease), lumbar 07/02/2014  . Facet syndrome, lumbar 07/02/2014  . Sacroiliac joint dysfunction 07/02/2014  . Lumbar degenerative disc disease 12/24/2013    Past Surgical History:  Procedure Laterality Date  . ANTERIOR CERVICAL DECOMP/DISCECTOMY FUSION N/A 08/20/2017   Procedure: Anterior Cervical Decompression Fusion - Cervical Four - Cervical Five - Cervical Five - Cervical Six;  Surgeon: Earnie Larsson, MD;  Location: [facility];  Service: Neurosurgery;  Laterality: N/A;  Anterior Cervical Decompression Fusion - Cervical Four - Cervical Five - Cervical Five - Cervical Six  . BILATERAL CARPAL TUNNEL RELEASE    . BREAST CYST ASPIRATION Left    neg  . CHOLECYSTECTOMY  1963  . COLONOSCOPY WITH PROPOFOL N/A 12/31/2015   Procedure: COLONOSCOPY WITH PROPOFOL;  Surgeon: Lollie Sails, MD;  Location: [facility] ENDOSCOPY;  Service: Endoscopy;  Laterality: N/A;  . ENDOMETRIAL ABLATION  1991  . ESOPHAGOGASTRODUODENOSCOPY (EGD) WITH PROPOFOL N/A 12/31/2015   Procedure: ESOPHAGOGASTRODUODENOSCOPY (EGD) WITH PROPOFOL;  Surgeon: Lollie Sails, MD;  Location: [facility] ENDOSCOPY;  Service: Endoscopy;  Laterality: N/A;  . ESOPHAGOGASTRODUODENOSCOPY (EGD) WITH PROPOFOL N/A 06/02/2016   Procedure: ESOPHAGOGASTRODUODENOSCOPY (EGD) WITH PROPOFOL;  Surgeon: Lollie Sails, MD;  Location: [facility] ENDOSCOPY;  Service: Endoscopy;  Laterality: N/A;  . ESOPHAGOGASTRODUODENOSCOPY (EGD) WITH PROPOFOL N/A 11/07/2016   Procedure: ESOPHAGOGASTRODUODENOSCOPY (EGD) WITH PROPOFOL;  Surgeon: Lollie Sails, MD;  Location: [facility] ENDOSCOPY;  Service: Endoscopy;  Laterality: N/A;  . EYE SURGERY Bilateral 2006 and 2012   cataract  surgery with lens implant  . eyelid surgery Bilateral 2014  . FOOT SURGERY Right    ligament and spurs  . LUMBAR LAMINECTOMY/DECOMPRESSION MICRODISCECTOMY Right 12/24/2013   Procedure: RIGHT LUMBAR THREE TO FOUR, LUMBAR FOUR TO FIVE, LUMBAR FIVE TO SACRAL ONE LAMINECTOMY/FORAMINOTOMY;  Surgeon: Floyce Stakes, MD;  Location: [facility] NEURO ORS;  Service: Neurosurgery;  Laterality: Right;  RIGHT L3-4 L4-5 L5-S1 LAMINECTOMY/FORAMINOTOMY  . NASAL SEPTUM SURGERY  2004  . SHOULDER SURGERY Right    for a frozen shoulder  . SKIN CANCER EXCISION Right    leg x 4  . skin cancer removal    . TUBAL LIGATION  1968    Prior to Admission medications   Medication Sig Start Date End Date Taking? Authorizing Provider  acetaminophen (TYLENOL) 500 MG tablet Take 500 mg by mouth every 6 (six) hours as needed (for pain.).     [provider]  aspirin EC 81 MG tablet Take 81 mg by mouth daily.    [provider]  Biotin 1000 MCG tablet Take 1,000 mcg by mouth daily.    [provider]  bisoprolol (ZEBETA) 10 MG tablet Take 1 tablet (10 mg total) by mouth daily. 02/07/15   Ditty, Kevan Ny, MD  cetirizine (ZYRTEC) 10 MG tablet Take 10 mg by mouth at bedtime.     [provider]  cholecalciferol (VITAMIN D) 1000 units tablet Take 1,000 Units by mouth daily.    [provider]  Cyanocobalamin (VITAMIN B-12) 2500 MCG SUBL Place 2,500 mcg under the tongue 3 (three) times a week.    [provider]  cyclobenzaprine (FLEXERIL) 10 MG tablet Take 1 tablet (10 mg total) by mouth 3 (three) times daily as needed for muscle spasms. 08/21/17   Earnie Larsson, MD  doxazosin (CARDURA) 8 MG tablet Take 8 mg by mouth at bedtime.     [provider]  HYDROcodone-acetaminophen (NORCO/VICODIN) 5-325 MG tablet Take 0.5 tablets by mouth daily as needed (for pain.).    [provider]  hydrOXYzine (ATARAX/VISTARIL) 25 MG tablet Take 25 mg by mouth daily.     [provider]  inFLIXimab (REMICADE) 100 MG injection Inject 500 mg into the vein every 6 (six) weeks.    [provider]  insulin NPH Human (HUMULIN N,NOVOLIN N) 100 UNIT/ML injection Inject 0.4 mLs (40 Units total) into the skin daily before breakfast. Patient taking differently: Inject 35-45 Units into the skin See admin instructions. 45 units in the morning & 35 units in the evening. 02/07/15   Ditty, Kevan Ny, MD  leflunomide (ARAVA) 10 MG tablet Take 10 mg by mouth daily.    [provider]  lisinopril (PRINIVIL,ZESTRIL) 20 MG tablet Take 20 mg by mouth daily.    [provider]  pantoprazole (PROTONIX) 40 MG tablet Take 40 mg by mouth 2 (two) times daily.     [provider]  simvastatin (ZOCOR) 40 MG tablet Take 40 mg by mouth at bedtime and may repeat dose one time if needed.  [provider]  spironolactone (ALDACTONE) 25 MG tablet Take 25 mg by mouth daily.    [provider]    Allergies Methotrexate derivatives, Acyclovir and related, Codeine, Contrast media [iodinated diagnostic agents], Inderide [propranolol-hctz], Iodine, Lodine [etodolac], Metrizamide, and Procardia [nifedipine]  Family History  Problem Relation Age of Onset  . COPD Mother   . Arthritis/Rheumatoid Mother   . Alzheimer's disease Father   . Heart attack Father   . Breast cancer Paternal Aunt 17    Social History Social History   Tobacco Use  . Smoking status: Never Smoker  . Smokeless tobacco: Never Used  Vaping Use  . Vaping Use: Never used  Substance Use Topics  . Alcohol use: No  . Drug use: No    Review of Systems  Constitutional: Negative for fever. Eyes: Negative for visual changes. ENT: Negative for sore throat. Cardiovascular: Negative for chest pain. Respiratory: Negative for shortness of breath. Gastrointestinal: Negative for abdominal pain, vomiting and diarrhea. Genitourinary: Negative for dysuria. Musculoskeletal:  Negative for back pain. Reports mild bilateral hip pain Skin: Negative for rash. Neurological: Negative for headaches, focal weakness or numbness. ____________________________________________  PHYSICAL EXAM:  VITAL SIGNS: ED Triage Vitals  Enc Vitals Group     BP 04/09/20 1550 (!) 181/68     Pulse Rate 04/09/20 1550 75     Resp 04/09/20 1550 16     Temp 04/09/20 1550 97.9 F (36.6 C)     Temp Source 04/09/20 1550 Axillary     SpO2 04/09/20 1550 96 %     Weight 04/09/20 1551 200 lb (90.7 kg)     Height 04/09/20 1551 5\' 5"  (1.651 m)     Head Circumference --      Peak Flow --      Pain Score 04/09/20 1551 3     Pain Loc --      Pain Edu? --      Excl. in [place]? --     Constitutional: Alert and oriented. Well appearing and in no distress.  GCS = 15 Head: Normocephalic and atraumatic.  Small soft tissue swelling to the crown of the scalp.  No hematoma or laceration appreciated. Eyes: Conjunctivae are normal. PERRL. Normal extraocular movements Neck: Supple. Normal ROM. No crepitus. Cardiovascular: Normal rate, regular rhythm. Normal distal pulses. Respiratory: Normal respiratory effort. No wheezes/rales/rhonchi. Gastrointestinal: Soft and nontender. No distention. Musculoskeletal: Nontender with normal range of motion in all extremities.  Neurologic: Cranial nerves II through XII grossly intact.  Normal gait without ataxia. Normal speech and language. No gross focal neurologic deficits are appreciated. Skin:  Skin is warm, dry and intact. No rash noted. ____________________________________________   RADIOLOGY  CT Head / Cervical Spine w/o CM  IMPRESSION: 1. No acute intracranial findings. 2. No evidence of acute cervical spine fracture, traumatic subluxation or static signs of instability. 3. Right maxillary and ethmoid sinus opacification, likely inflammatory. No visible facial fracture. 4. Postsurgical changes and spondylosis in the cervical spine as described.  DG  Lumbar Spine  IMPRESSION: 1. No acute fracture or dislocation. 2. Multilevel degenerative changes.  DG Right / Left Hip w/ Pelvis  IMPRESSION: No acute osseous abnormality. ____________________________________________  PROCEDURES  Procedures ____________________________________________  INITIAL IMPRESSION / ASSESSMENT AND PLAN / ED COURSE  Geriatric patient ED evaluation of injury sustained following a mechanical fall.  Patient was accidentally knocked over by her small dog.  She presents with complaints of some mild headache as well as some bilateral hip pain.  Evaluation  by CT scan of the head and neck are negative and reassuring at this time.  Plain films of the lumbar spine, hips and pelvis, did not reveal any acute fracture or dislocation.  Patient's exam is reassuring as she shows no signs of acute neuromuscular deficit or serious injury at this time.  Patient is reporting only mild headache symptoms at this time without any other significant complaints.  She is inclined to take Tylenol at home once released.  She is discharged at this time to follow-up with primary provider return to the ED if needed.   ROGELIO WINBUSH was evaluated in Emergency Department on 04/09/2020 for the symptoms described in the history of present illness. She was evaluated in the context of the global COVID-19 pandemic, which necessitated consideration that the patient might be at risk for infection with the SARS-CoV-2 virus that causes COVID-19. Institutional protocols and algorithms that pertain to the evaluation of patients at risk for COVID-19 are in a state of rapid change based on information released by regulatory bodies including the CDC and federal and state organizations. These policies and algorithms were followed during the patient's care in the ED. ____________________________________________  FINAL CLINICAL IMPRESSION(S) / ED DIAGNOSES  Final diagnoses:  Fall in home, initial encounter  Minor  head injury, initial encounter      Melvenia Needles, PA-C 04/09/20 1843    Merlyn Lot, MD 04/09/20 2139

## 2020-04-09 NOTE — ED Triage Notes (Signed)
Pt to ER from [facility] clinic after mechanical fall from a standing position today at home. Pt denies LOC or dizziness/weakness. Pt reports she fell back, hit her head and on her way down hurt both hips. Was able to get up off the ground unassisted. Complains of a headache and bilateral hip pain.   Denies usage of blood thinners, does take a daily aspirin. Pt a/ox4.

## 2020-04-09 NOTE — ED Notes (Signed)
See triage note  Presents s/p fall her dog got behind her   She fell backwards  Was able to get her self up  States she hit her head  And was having some pain to hip area

## 2020-04-19 DIAGNOSIS — M419 Scoliosis, unspecified: Secondary | ICD-10-CM | POA: Insufficient documentation

## 2020-04-30 ENCOUNTER — Other Ambulatory Visit: Payer: Self-pay | Admitting: Family Medicine

## 2020-04-30 DIAGNOSIS — M5416 Radiculopathy, lumbar region: Secondary | ICD-10-CM

## 2020-05-12 ENCOUNTER — Ambulatory Visit
Admission: RE | Admit: 2020-05-12 | Discharge: 2020-05-12 | Disposition: A | Discharge status: Home / Self Care | Payer: Medicare Other | Source: Ambulatory Visit | Attending: Family Medicine | Admitting: Family Medicine

## 2020-05-12 ENCOUNTER — Other Ambulatory Visit: Payer: Self-pay

## 2020-05-12 DIAGNOSIS — M5416 Radiculopathy, lumbar region: Secondary | ICD-10-CM | POA: Diagnosis not present

## 2020-05-21 ENCOUNTER — Other Ambulatory Visit: Payer: Self-pay | Admitting: Neurosurgery

## 2020-05-21 DIAGNOSIS — M4310 Spondylolisthesis, site unspecified: Secondary | ICD-10-CM

## 2020-06-05 ENCOUNTER — Ambulatory Visit
Admission: RE | Admit: 2020-06-05 | Discharge: 2020-06-05 | Disposition: A | Discharge status: Home / Self Care | Payer: Medicare Other | Source: Ambulatory Visit | Attending: Neurosurgery | Admitting: Neurosurgery

## 2020-06-05 DIAGNOSIS — M4310 Spondylolisthesis, site unspecified: Secondary | ICD-10-CM

## 2020-06-18 ENCOUNTER — Other Ambulatory Visit: Payer: Self-pay | Admitting: Neurosurgery

## 2020-06-22 NOTE — Pre-Procedure Instructions (Signed)
Surgical Instructions    Your procedure is scheduled on Friday, June 17th.  Report to [facility] Main Entrance "A" at 11:45 A.M., then check in with the Admitting office.  Call this number if you have problems the morning of surgery:  [phone]   If you have any questions prior to your surgery date call [phone]: Open Monday-Friday 8am-4pm    Remember:  Do not eat or drink after midnight the night before your surgery.     Take these medicines the morning of surgery with A SIP OF WATER: bisoprolol (ZEBETA)  montelukast (SINGULAIR)  pantoprazole (PROTONIX)   IF NEEDED: acetaminophen (TYLENOL) HYDROcodone-acetaminophen (NORCO/VICODIN)  >>STOP leflunomide (ARAVA) NOW.<<    *Follow your surgeon's instructions on when to stop Aspirin.  If no instructions were given by your surgeon then you will need to call the office to get those instructions.     As of today, STOP taking any NSAIDs such as Aleve, Naproxen, Ibuprofen, Motrin, Advil, Goody's, BC's, all herbal medications, fish oil, and all vitamins.   WHAT DO I DO ABOUT MY DIABETES MEDICATION?  THE NIGHT BEFORE SURGERY, take 15 units of insulin NPH Human (HUMULIN N,NOVOLIN N).      THE MORNING OF SURGERY, take 20 units of insulin NPH Human (HUMULIN N,NOVOLIN N).    HOW TO MANAGE YOUR DIABETES BEFORE AND AFTER SURGERY  Why is it important to control my blood sugar before and after surgery? Improving blood sugar levels before and after surgery helps healing and can limit problems. A way of improving blood sugar control is eating a healthy diet by:  Eating less sugar and carbohydrates  Increasing activity/exercise  Talking with your doctor about reaching your blood sugar goals High blood sugars (greater than 180 mg/dL) can raise your risk of infections and slow your recovery, so you will need to focus on controlling your diabetes during the weeks before surgery. Make sure that the doctor who takes care of your  diabetes knows about your planned surgery including the date and location.  How do I manage my blood sugar before surgery? Check your blood sugar at least 4 times a day, starting 2 days before surgery, to make sure that the level is not too high or low.  Check your blood sugar the morning of your surgery when you wake up and every 2 hours until you get to the Short Stay unit.  If your blood sugar is less than 70 mg/dL, you will need to treat for low blood sugar: Do not take insulin. Treat a low blood sugar (less than 70 mg/dL) with  cup of clear juice (cranberry or apple), 4 glucose tablets, OR glucose gel. Recheck blood sugar in 15 minutes after treatment (to make sure it is greater than 70 mg/dL). If your blood sugar is not greater than 70 mg/dL on recheck, call [phone] for further instructions. Report your blood sugar to the short stay nurse when you get to Short Stay.  If you are admitted to the hospital after surgery: Your blood sugar will be checked by the staff and you will probably be given insulin after surgery (instead of oral diabetes medicines) to make sure you have good blood sugar levels. The goal for blood sugar control after surgery is 80-180 mg/dL.             Special instructions:    [place]- Preparing For Surgery  Before surgery, you can play an important role. Because skin is not sterile, your skin needs to  be as free of germs as possible. You can reduce the number of germs on your skin by washing with CHG (chlorahexidine gluconate) Soap before surgery.  CHG is an antiseptic cleaner which kills germs and bonds with the skin to continue killing germs even after washing.     Please do not use if you have an allergy to CHG or antibacterial soaps. If your skin becomes reddened/irritated stop using the CHG.  Do not shave (including legs and underarms) for at least 48 hours prior to first CHG shower. It is OK to shave your face.  Please follow these instructions  carefully.     Shower the NIGHT BEFORE SURGERY and the MORNING OF SURGERY with CHG Soap.   If you chose to wash your hair, wash your hair first as usual with your normal shampoo. After you shampoo, rinse your hair and body thoroughly to remove the shampoo.  Then [employer] and genitals (private parts) with your normal soap and rinse thoroughly to remove soap.  After that Use CHG Soap as you would any other liquid soap. You can apply CHG directly to the skin and wash gently with a scrungie or a clean washcloth.   Apply the CHG Soap to your body ONLY FROM THE NECK DOWN.  Do not use on open wounds or open sores. Avoid contact with your eyes, ears, mouth and genitals (private parts). Wash Face and genitals (private parts)  with your normal soap.   Wash thoroughly, paying special attention to the area where your surgery will be performed.  Thoroughly rinse your body with warm water from the neck down.  DO NOT shower/wash with your normal soap after using and rinsing off the CHG Soap.  Pat yourself dry with a CLEAN TOWEL.  Wear CLEAN PAJAMAS to bed the night before surgery  Place CLEAN SHEETS on your bed the night before your surgery  DO NOT SLEEP WITH PETS.   Day of Surgery: Take a shower with CHG soap. Wear Clean/Comfortable clothing. Do not apply any deodorants/lotions.   Remember to brush your teeth WITH YOUR REGULAR TOOTHPASTE. Do not wear jewelry or makeup. DO Not wear nail polish, gel polish, artificial nails, or any other type of covering on natural nails including finger and toenails. If patients have artificial nails, gel coating, etc. that need to be removed by a nail salon please have this removed prior to surgery or surgery may need to be canceled/delayed if the surgeon/ anesthesia feels like the patient is unable to be adequately monitored. Do not wear lotions, powders, perfumes/colognes, or deodorant. Do not bring valuables to the hospital.     [facility] is not  responsible for any belongings or valuables.  Do NOT Smoke (Tobacco/Vaping) or drink Alcohol 24 hours prior to your procedure. If you use a CPAP at night, you may bring all equipment for your overnight stay.   Contacts, glasses, dentures or bridgework may not be worn into surgery, please bring cases for these belongings   For patients admitted to the hospital, discharge time will be determined by your treatment team.   Patients discharged the day of surgery will not be allowed to drive home, and someone needs to stay with them for 24 hours.  ONLY 1 SUPPORT PERSON MAY BE PRESENT WHILE YOU ARE IN SURGERY. IF YOU ARE TO BE ADMITTED ONCE YOU ARE IN YOUR ROOM YOU WILL BE ALLOWED TWO (2) VISITORS.  Minor children may have two parents present. Special consideration for safety and communication  needs will be reviewed on a case by case basis.    Please read over the following fact sheets that you were given.

## 2020-06-23 ENCOUNTER — Encounter (HOSPITAL_COMMUNITY)
Admission: RE | Admit: 2020-06-23 | Discharge: 2020-06-23 | Disposition: A | Discharge status: Home / Self Care | Payer: Medicare Other | Source: Ambulatory Visit | Attending: Neurosurgery | Admitting: Neurosurgery

## 2020-06-23 ENCOUNTER — Other Ambulatory Visit: Payer: Self-pay

## 2020-06-23 ENCOUNTER — Encounter (HOSPITAL_COMMUNITY): Payer: Self-pay

## 2020-06-23 DIAGNOSIS — Z20822 Contact with and (suspected) exposure to covid-19: Secondary | ICD-10-CM | POA: Insufficient documentation

## 2020-06-23 DIAGNOSIS — Z01812 Encounter for preprocedural laboratory examination: Secondary | ICD-10-CM | POA: Insufficient documentation

## 2020-06-23 LAB — BASIC METABOLIC PANEL
Anion gap: 6 (ref 5–15)
BUN: 26 mg/dL — ABNORMAL HIGH (ref 8–23)
CO2: 23 mmol/L (ref 22–32)
Calcium: 9.4 mg/dL (ref 8.9–10.3)
Chloride: 108 mmol/L (ref 98–111)
Creatinine, Ser: 0.87 mg/dL (ref 0.44–1.00)
GFR, Estimated: >60 mL/min (ref >60)
Glucose, Bld: 98 mg/dL (ref 70–99)
Potassium: 4.7 mmol/L (ref 3.5–5.1)
Sodium: 137 mmol/L (ref 135–145)

## 2020-06-23 LAB — SARS CORONAVIRUS 2 (TAT 6-24 HRS): SARS Coronavirus 2: NEGATIVE

## 2020-06-23 LAB — CBC WITH DIFFERENTIAL/PLATELET
Abs Immature Granulocytes: 0.02 10*3/uL (ref 0.00–0.07)
Basophils Absolute: 0.1 10*3/uL (ref 0.0–0.1)
Basophils Relative: 1 %
Eosinophils Absolute: 0.2 10*3/uL (ref 0.0–0.5)
Eosinophils Relative: 3 %
HCT: 43.7 % (ref 36.0–46.0)
Hemoglobin: 13.9 g/dL (ref 12.0–15.0)
Immature Granulocytes: 0 %
Lymphocytes Relative: 50 %
Lymphs Abs: 3.8 10*3/uL (ref 0.7–4.0)
MCH: 28.7 pg (ref 26.0–34.0)
MCHC: 31.8 g/dL (ref 30.0–36.0)
MCV: 90.3 fL (ref 80.0–100.0)
Monocytes Absolute: 0.7 10*3/uL (ref 0.1–1.0)
Monocytes Relative: 8 %
Neutro Abs: 3 10*3/uL (ref 1.7–7.7)
Neutrophils Relative %: 38 %
Platelets: 109 10*3/uL — ABNORMAL LOW (ref 150–400)
RBC: 4.84 MIL/uL (ref 3.87–5.11)
RDW: 12.7 % (ref 11.5–15.5)
WBC: 7.8 10*3/uL (ref 4.0–10.5)
nRBC: 0 % (ref 0.0–0.2)

## 2020-06-23 LAB — TYPE AND SCREEN
ABO/RH(D): A POS
Antibody Screen: NEGATIVE

## 2020-06-23 LAB — GLUCOSE, CAPILLARY
Glucose-Capillary: 65 mg/dL — ABNORMAL LOW (ref 70–99)
Glucose-Capillary: 118 mg/dL — ABNORMAL HIGH (ref 70–99)

## 2020-06-23 LAB — SURGICAL PCR SCREEN
MRSA, PCR: NEGATIVE
Staphylococcus aureus: NEGATIVE

## 2020-06-23 NOTE — Progress Notes (Signed)
PCP - Dr. [facility]  Cardiologist - Denies  Chest x-ray - Denies  EKG - 10/2019- Req'd  Stress Test - Denies  ECHO - 10/2019- Req'd  Cardiac Cath - Denies  AICD-na PM-na LOOP-na  Sleep Study - Yes- Positive CPAP - Denies  LABS- 06/23/20: CBC w/D, BMP, PCR, COVID  ASA- LD- 6/10  ERAS- No  HA1C- 06/23/20 Fasting Blood Sugar - 70-222, today 65- 8 oz of Sprite and 3 packs of graham crackers given. Recheck 118 Checks Blood Sugar ___1__ times a day  Anesthesia- Yes- req'd records  Pt denies having chest pain, sob, or fever at this time. All instructions explained to the pt, with a verbal understanding of the material. Pt agrees to go over the instructions while at home for a better understanding. Pt advised to social distance and wear a mask if she has to leave her home after being tested for covid-19. The opportunity to ask questions was provided.    Coronavirus Screening  Have you experienced the following symptoms:  Cough yes/no: No Fever (>100.9F)  yes/no: No Runny nose yes/no: No Sore throat yes/no: No Difficulty breathing/shortness of breath  yes/no: No  Have you or a family member traveled in the last 14 days and where? yes/no: No   If the patient indicates "YES" to the above questions, their PAT will be rescheduled to limit the exposure to others and, the surgeon will be notified. THE PATIENT WILL NEED TO BE ASYMPTOMATIC FOR 14 DAYS.   If the patient is not experiencing any of these symptoms, the PAT nurse will instruct them to NOT bring anyone with them to their appointment since they may have these symptoms or traveled as well.   Please remind your patients and families that hospital visitation restrictions are in effect and the importance of the restrictions.

## 2020-06-24 LAB — HEMOGLOBIN A1C
Hgb A1c MFr Bld: 5.9 % — ABNORMAL HIGH (ref 4.8–5.6)
Mean Plasma Glucose: 123 mg/dL

## 2020-06-24 NOTE — Progress Notes (Signed)
Anesthesia Chart Review:  History of mild-moderate aortic stenosis followed with annual echo by PCP Dr. Emily Filbert at [place].  Last echo 11/12/2019 showed normal LVEF, moderate AAS with mean gradient 19.3 mmHg.  Dr. Sabra Heck commented on this result stating, "echocardiogram shows normal heart function.  The calcium buildup on the aortic valve has worsened slightly, judged as moderate at this point.  We will continue checking this annually.  There is no treatment unless he were to become severe.  Currently it is early moderate."  She also had a stress echo in 2018 that was normal (mean gradient at that time was 13 mmHg).  History of rheumatoid arthritis followed by rheumatology [facility] clinic, she is maintained on Remicade.  She is pleased on methotrexate this was stopped development of pulmonary fibrosis.  Her pulmonary status is reportedly stable per notes in Care Everywhere.  History of OSA, patient reports she does not use CPAP.  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  Preop labs reviewed, unremarkable.  TTE 11/12/19 (care everywhere): INTERPRETATION  NORMAL LEFT VENTRICULAR SYSTOLIC FUNCTION  NORMAL RIGHT VENTRICULAR SYSTOLIC FUNCTION  MILD VALVULAR REGURGITATION (See above)  MODERATE VALVULAR STENOSIS (See above)  Progression in AS from 2020 study   Stress echo 01/13/2016 (Care Everywhere): INTERPRETATION  Normal Stress Echocardiogram  NORMAL RIGHT VENTRICULAR SYSTOLIC FUNCTION  MILD VALVULAR REGURGITATION (See above)  MILD VALVULAR STENOSIS (See above)  Mild calcific AS, peak transaortic velocity 2.5 m/s    Courtney [name] [facility] Short Stay Center/Anesthesiology Phone [phone] 06/24/2020 3:29 PM

## 2020-06-24 NOTE — Anesthesia Preprocedure Evaluation (Addendum)
Anesthesia Evaluation  Patient identified by MRN, date of birth, ID band Patient awake    Reviewed: Allergy & Precautions, NPO status , Patient's Chart, lab work & pertinent test results  Airway Mallampati: II  TM Distance: >3 FB Neck ROM: Full    Dental  (+) Dental Advisory Given, Edentulous Upper   Pulmonary    breath sounds clear to auscultation       Cardiovascular hypertension,  Rhythm:Regular Rate:Normal     Neuro/Psych    GI/Hepatic   Endo/Other  diabetes  Renal/GU      Musculoskeletal   Abdominal   Peds  Hematology   Anesthesia Other Findings   Reproductive/Obstetrics                           Anesthesia Physical Anesthesia Plan  ASA: 3  Anesthesia Plan: General   Post-op Pain Management:    Induction: Intravenous  PONV Risk Score and Plan: Ondansetron and Dexamethasone  Airway Management Planned: Oral ETT  Additional Equipment:   Intra-op Plan:   Post-operative Plan: Extubation in OR  Informed Consent: I have reviewed the patients History and Physical, chart, labs and discussed the procedure including the risks, benefits and alternatives for the proposed anesthesia with the patient or authorized representative who has indicated his/her understanding and acceptance.     Dental advisory given  Plan Discussed with: CRNA and Anesthesiologist  Anesthesia Plan Comments: (PAT note by Karoline Caldwell, PA-C: History of mild-moderate aortic stenosis followed with annual echo by PCP Dr. Emily Filbert at [place].  Last echo 11/12/2019 showed normal LVEF, moderate AAS with mean gradient 19.3 mmHg.  Dr. Sabra Heck commented on this result stating, "echocardiogram shows normal heart function.  The calcium buildup on the aortic valve has worsened slightly, judged as moderate at this point.  We will continue checking this annually.  There is no treatment unless he were to become severe.   Currently it is early moderate."  She also had a stress echo in 2018 that was normal (mean gradient at that time was 13 mmHg).  History of rheumatoid arthritis followed by rheumatology [facility] clinic, she is maintained on Remicade.  She is pleased on methotrexate this was stopped development of pulmonary fibrosis.  Her pulmonary status is reportedly stable per notes in Care Everywhere.  History of OSA, patient reports she does not use CPAP.  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  Preop labs reviewed, unremarkable.  TTE 11/12/19 (care everywhere): INTERPRETATION  NORMAL LEFT VENTRICULAR SYSTOLIC FUNCTION  NORMAL RIGHT VENTRICULAR SYSTOLIC FUNCTION  MILD VALVULAR REGURGITATION (See above)  MODERATE VALVULAR STENOSIS (See above)  Progression in AS from 2020 study   Stress echo 01/13/2016 (Care Everywhere): INTERPRETATION  Normal Stress Echocardiogram  NORMAL RIGHT VENTRICULAR SYSTOLIC FUNCTION  MILD VALVULAR REGURGITATION (See above)  MILD VALVULAR STENOSIS (See above)  Mild calcific AS, peak transaortic velocity 2.5 m/s  )       Anesthesia Quick Evaluation

## 2020-06-25 ENCOUNTER — Encounter (HOSPITAL_COMMUNITY)
Admission: RE | Disposition: A | Discharge status: Home / Self Care | Payer: Self-pay | Source: Home / Self Care | Attending: Neurosurgery

## 2020-06-25 ENCOUNTER — Ambulatory Visit (HOSPITAL_COMMUNITY): Payer: Medicare Other

## 2020-06-25 ENCOUNTER — Inpatient Hospital Stay (HOSPITAL_COMMUNITY)
Admission: RE | Admit: 2020-06-25 | Discharge: 2020-06-28 | DRG: 454 | Disposition: A | Discharge status: Home / Self Care | Payer: Medicare Other | Attending: Neurosurgery | Admitting: Neurosurgery

## 2020-06-25 ENCOUNTER — Ambulatory Visit (HOSPITAL_COMMUNITY): Payer: Medicare Other | Admitting: Physician Assistant

## 2020-06-25 ENCOUNTER — Encounter (HOSPITAL_COMMUNITY): Payer: Self-pay | Admitting: Neurosurgery

## 2020-06-25 DIAGNOSIS — Z9851 Tubal ligation status: Secondary | ICD-10-CM

## 2020-06-25 DIAGNOSIS — Z825 Family history of asthma and other chronic lower respiratory diseases: Secondary | ICD-10-CM

## 2020-06-25 DIAGNOSIS — I10 Essential (primary) hypertension: Secondary | ICD-10-CM | POA: Diagnosis present

## 2020-06-25 DIAGNOSIS — Z8249 Family history of ischemic heart disease and other diseases of the circulatory system: Secondary | ICD-10-CM

## 2020-06-25 DIAGNOSIS — L409 Psoriasis, unspecified: Secondary | ICD-10-CM | POA: Diagnosis present

## 2020-06-25 DIAGNOSIS — Y838 Other surgical procedures as the cause of abnormal reaction of the patient, or of later complication, without mention of misadventure at the time of the procedure: Secondary | ICD-10-CM | POA: Diagnosis present

## 2020-06-25 DIAGNOSIS — Z79899 Other long term (current) drug therapy: Secondary | ICD-10-CM

## 2020-06-25 DIAGNOSIS — M960 Pseudarthrosis after fusion or arthrodesis: Secondary | ICD-10-CM | POA: Diagnosis present

## 2020-06-25 DIAGNOSIS — M4316 Spondylolisthesis, lumbar region: Principal | ICD-10-CM | POA: Diagnosis present

## 2020-06-25 DIAGNOSIS — R110 Nausea: Secondary | ICD-10-CM | POA: Diagnosis not present

## 2020-06-25 DIAGNOSIS — E119 Type 2 diabetes mellitus without complications: Secondary | ICD-10-CM | POA: Diagnosis present

## 2020-06-25 DIAGNOSIS — Z820 Family history of epilepsy and other diseases of the nervous system: Secondary | ICD-10-CM

## 2020-06-25 DIAGNOSIS — M4726 Other spondylosis with radiculopathy, lumbar region: Secondary | ICD-10-CM | POA: Diagnosis present

## 2020-06-25 DIAGNOSIS — M4310 Spondylolisthesis, site unspecified: Secondary | ICD-10-CM | POA: Diagnosis present

## 2020-06-25 DIAGNOSIS — Z7982 Long term (current) use of aspirin: Secondary | ICD-10-CM

## 2020-06-25 DIAGNOSIS — Z885 Allergy status to narcotic agent status: Secondary | ICD-10-CM

## 2020-06-25 DIAGNOSIS — Z803 Family history of malignant neoplasm of breast: Secondary | ICD-10-CM

## 2020-06-25 DIAGNOSIS — Z981 Arthrodesis status: Secondary | ICD-10-CM

## 2020-06-25 DIAGNOSIS — K219 Gastro-esophageal reflux disease without esophagitis: Secondary | ICD-10-CM | POA: Diagnosis present

## 2020-06-25 DIAGNOSIS — Z888 Allergy status to other drugs, medicaments and biological substances status: Secondary | ICD-10-CM

## 2020-06-25 DIAGNOSIS — Z91041 Radiographic dye allergy status: Secondary | ICD-10-CM

## 2020-06-25 DIAGNOSIS — M069 Rheumatoid arthritis, unspecified: Secondary | ICD-10-CM | POA: Diagnosis present

## 2020-06-25 DIAGNOSIS — M48061 Spinal stenosis, lumbar region without neurogenic claudication: Secondary | ICD-10-CM | POA: Diagnosis present

## 2020-06-25 DIAGNOSIS — Z419 Encounter for procedure for purposes other than remedying health state, unspecified: Secondary | ICD-10-CM

## 2020-06-25 DIAGNOSIS — Z20822 Contact with and (suspected) exposure to covid-19: Secondary | ICD-10-CM | POA: Diagnosis present

## 2020-06-25 DIAGNOSIS — Z794 Long term (current) use of insulin: Secondary | ICD-10-CM

## 2020-06-25 DIAGNOSIS — M1990 Unspecified osteoarthritis, unspecified site: Secondary | ICD-10-CM | POA: Diagnosis present

## 2020-06-25 DIAGNOSIS — L719 Rosacea, unspecified: Secondary | ICD-10-CM | POA: Diagnosis present

## 2020-06-25 LAB — GLUCOSE, CAPILLARY
Glucose-Capillary: 86 mg/dL (ref 70–99)
Glucose-Capillary: 56 mg/dL — ABNORMAL LOW (ref 70–99)
Glucose-Capillary: 196 mg/dL — ABNORMAL HIGH (ref 70–99)
Glucose-Capillary: 138 mg/dL — ABNORMAL HIGH (ref 70–99)
Glucose-Capillary: 136 mg/dL — ABNORMAL HIGH (ref 70–99)
Glucose-Capillary: 235 mg/dL — ABNORMAL HIGH (ref 70–99)

## 2020-06-25 SURGERY — POSTERIOR LUMBAR FUSION 1 LEVEL
Anesthesia: General | Site: Back

## 2020-06-25 MED ORDER — SODIUM CHLORIDE 0.9 % IV SOLN: 250.0000 mL | INTRAVENOUS | Status: DC

## 2020-06-25 MED ORDER — DIAZEPAM 5 MG PO TABS
5.0000 mg | ORAL_TABLET | Freq: Four times a day (QID) | ORAL | Status: DC | PRN
  Administered 2020-06-26 (×2): 5 mg via ORAL
  Filled 2020-06-25 (×2): qty 1

## 2020-06-25 MED ORDER — FENTANYL CITRATE (PF) 100 MCG/2ML IJ SOLN
INTRAMUSCULAR | Status: AC
  Filled 2020-06-25: qty 2

## 2020-06-25 MED ORDER — 0.9 % SODIUM CHLORIDE (POUR BTL) OPTIME
TOPICAL | Status: DC | PRN
  Administered 2020-06-25: 1000 mL

## 2020-06-25 MED ORDER — DEXTROSE 50 % IV SOLN
INTRAVENOUS | Status: DC | PRN
  Administered 2020-06-25: 25 g via INTRAVENOUS

## 2020-06-25 MED ORDER — ONDANSETRON HCL 4 MG/2ML IJ SOLN
INTRAMUSCULAR | Status: AC
  Filled 2020-06-25: qty 2

## 2020-06-25 MED ORDER — PANTOPRAZOLE SODIUM 40 MG PO TBEC
40.0000 mg | DELAYED_RELEASE_TABLET | Freq: Every day | ORAL | Status: DC
  Administered 2020-06-26 – 2020-06-28 (×3): 40 mg via ORAL
  Filled 2020-06-25 (×3): qty 1

## 2020-06-25 MED ORDER — FENTANYL CITRATE (PF) 250 MCG/5ML IJ SOLN
INTRAMUSCULAR | Status: DC | PRN
  Administered 2020-06-25 (×2): 50 ug via INTRAVENOUS
  Administered 2020-06-25: 25 ug via INTRAVENOUS
  Administered 2020-06-25: 75 ug via INTRAVENOUS
  Administered 2020-06-25: 50 ug via INTRAVENOUS
  Administered 2020-06-25 (×2): 25 ug via INTRAVENOUS

## 2020-06-25 MED ORDER — LIDOCAINE 2% (20 MG/ML) 5 ML SYRINGE
INTRAMUSCULAR | Status: DC | PRN
  Administered 2020-06-25: 40 mg via INTRAVENOUS

## 2020-06-25 MED ORDER — LEVOCETIRIZINE DIHYDROCHLORIDE 5 MG PO TABS: 5.0000 mg | ORAL_TABLET | Freq: Every day | ORAL | Status: DC

## 2020-06-25 MED ORDER — INSULIN NPH (HUMAN) (ISOPHANE) 100 UNIT/ML ~~LOC~~ SUSP
40.0000 [IU] | Freq: Every day | SUBCUTANEOUS | Status: DC
  Administered 2020-06-26: 40 [IU] via SUBCUTANEOUS
  Filled 2020-06-25: qty 10

## 2020-06-25 MED ORDER — FENTANYL CITRATE (PF) 250 MCG/5ML IJ SOLN
INTRAMUSCULAR | Status: AC
  Filled 2020-06-25: qty 5

## 2020-06-25 MED ORDER — LEFLUNOMIDE 20 MG PO TABS
10.0000 mg | ORAL_TABLET | Freq: Every day | ORAL | Status: DC
  Administered 2020-06-26 – 2020-06-28 (×3): 10 mg via ORAL
  Filled 2020-06-25 (×3): qty 0.5

## 2020-06-25 MED ORDER — HYDROMORPHONE HCL 1 MG/ML IJ SOLN
1.0000 mg | INTRAMUSCULAR | Status: DC | PRN
  Administered 2020-06-25 – 2020-06-26 (×2): 1 mg via INTRAVENOUS
  Filled 2020-06-25 (×2): qty 1

## 2020-06-25 MED ORDER — ALBUMIN HUMAN 5 % IV SOLN: INTRAVENOUS | Status: DC | PRN

## 2020-06-25 MED ORDER — BISACODYL 10 MG RE SUPP
10.0000 mg | Freq: Every day | RECTAL | Status: DC | PRN
  Administered 2020-06-28: 10 mg via RECTAL
  Filled 2020-06-25: qty 1

## 2020-06-25 MED ORDER — FENTANYL CITRATE (PF) 100 MCG/2ML IJ SOLN: INTRAMUSCULAR | Status: DC | PRN

## 2020-06-25 MED ORDER — THROMBIN 20000 UNITS EX SOLR
CUTANEOUS | Status: DC | PRN
  Administered 2020-06-25: 20 mL via TOPICAL

## 2020-06-25 MED ORDER — ASPIRIN EC 81 MG PO TBEC
81.0000 mg | DELAYED_RELEASE_TABLET | Freq: Every day | ORAL | Status: DC
  Administered 2020-06-26 – 2020-06-28 (×3): 81 mg via ORAL
  Filled 2020-06-25 (×3): qty 1

## 2020-06-25 MED ORDER — ACETAMINOPHEN 650 MG RE SUPP: 650.0000 mg | RECTAL | Status: DC | PRN

## 2020-06-25 MED ORDER — PROPOFOL 500 MG/50ML IV EMUL
INTRAVENOUS | Status: DC | PRN
  Administered 2020-06-25: 20 ug/kg/min via INTRAVENOUS

## 2020-06-25 MED ORDER — PHENOL 1.4 % MT LIQD
1.0000 | OROMUCOSAL | Status: DC | PRN
Start: 2020-06-25 — End: 2020-06-28

## 2020-06-25 MED ORDER — VANCOMYCIN HCL 1000 MG IV SOLR
INTRAVENOUS | Status: DC | PRN
  Administered 2020-06-25: 1000 mg

## 2020-06-25 MED ORDER — CEFAZOLIN SODIUM-DEXTROSE 2-3 GM-%(50ML) IV SOLR: INTRAVENOUS | Status: DC | PRN

## 2020-06-25 MED ORDER — INSULIN NPH (HUMAN) (ISOPHANE) 100 UNIT/ML ~~LOC~~ SUSP
30.0000 [IU] | Freq: Every day | SUBCUTANEOUS | Status: DC
  Administered 2020-06-25 – 2020-06-27 (×3): 30 [IU] via SUBCUTANEOUS
  Filled 2020-06-25: qty 10

## 2020-06-25 MED ORDER — BISOPROLOL FUMARATE 10 MG PO TABS
10.0000 mg | ORAL_TABLET | Freq: Every day | ORAL | Status: DC
  Administered 2020-06-26 – 2020-06-28 (×3): 10 mg via ORAL
  Filled 2020-06-25 (×3): qty 1

## 2020-06-25 MED ORDER — MONTELUKAST SODIUM 10 MG PO TABS
10.0000 mg | ORAL_TABLET | Freq: Every day | ORAL | Status: DC
  Administered 2020-06-26 – 2020-06-28 (×3): 10 mg via ORAL
  Filled 2020-06-25 (×3): qty 1

## 2020-06-25 MED ORDER — DEXTROSE 50 % IV SOLN
INTRAVENOUS | Status: AC
  Filled 2020-06-25: qty 50

## 2020-06-25 MED ORDER — FLEET ENEMA 7-19 GM/118ML RE ENEM: 1.0000 | ENEMA | Freq: Once | RECTAL | Status: DC | PRN

## 2020-06-25 MED ORDER — PROPOFOL 10 MG/ML IV BOLUS
INTRAVENOUS | Status: DC | PRN
  Administered 2020-06-25 (×2): 170 mg via INTRAVENOUS

## 2020-06-25 MED ORDER — DEXAMETHASONE SODIUM PHOSPHATE 10 MG/ML IJ SOLN
10.0000 mg | Freq: Once | INTRAMUSCULAR | Status: AC
  Administered 2020-06-25: 10 mg via INTRAVENOUS
  Filled 2020-06-25: qty 1

## 2020-06-25 MED ORDER — SIMVASTATIN 20 MG PO TABS
40.0000 mg | ORAL_TABLET | Freq: Every day | ORAL | Status: DC
  Administered 2020-06-25 – 2020-06-27 (×3): 40 mg via ORAL
  Filled 2020-06-25 (×3): qty 2

## 2020-06-25 MED ORDER — PHENYLEPHRINE 40 MCG/ML (10ML) SYRINGE FOR IV PUSH (FOR BLOOD PRESSURE SUPPORT)
PREFILLED_SYRINGE | INTRAVENOUS | Status: AC
  Filled 2020-06-25: qty 20

## 2020-06-25 MED ORDER — MENTHOL 3 MG MT LOZG: 1.0000 | LOZENGE | OROMUCOSAL | Status: DC | PRN

## 2020-06-25 MED ORDER — CHLORHEXIDINE GLUCONATE 0.12 % MT SOLN
15.0000 mL | Freq: Once | OROMUCOSAL | Status: AC
  Administered 2020-06-25: 15 mL via OROMUCOSAL
  Filled 2020-06-25: qty 15

## 2020-06-25 MED ORDER — SODIUM CHLORIDE 0.9% FLUSH: 3.0000 mL | INTRAVENOUS | Status: DC | PRN

## 2020-06-25 MED ORDER — ONDANSETRON HCL 4 MG PO TABS
4.0000 mg | ORAL_TABLET | Freq: Four times a day (QID) | ORAL | Status: DC | PRN
  Administered 2020-06-27 – 2020-06-28 (×4): 4 mg via ORAL
  Filled 2020-06-25 (×4): qty 1

## 2020-06-25 MED ORDER — POLYETHYLENE GLYCOL 3350 17 G PO PACK
17.0000 g | PACK | Freq: Every day | ORAL | Status: DC | PRN
  Administered 2020-06-28: 17 g via ORAL
  Filled 2020-06-25: qty 1

## 2020-06-25 MED ORDER — LACTATED RINGERS IV SOLN: INTRAVENOUS | Status: DC

## 2020-06-25 MED ORDER — BUPIVACAINE HCL (PF) 0.25 % IJ SOLN
INTRAMUSCULAR | Status: AC
  Filled 2020-06-25: qty 30

## 2020-06-25 MED ORDER — ROCURONIUM BROMIDE 10 MG/ML (PF) SYRINGE
PREFILLED_SYRINGE | INTRAVENOUS | Status: DC | PRN
  Administered 2020-06-25: 20 mg via INTRAVENOUS
  Administered 2020-06-25: 60 mg via INTRAVENOUS
  Administered 2020-06-25: 20 mg via INTRAVENOUS

## 2020-06-25 MED ORDER — HYDROCODONE-ACETAMINOPHEN 10-325 MG PO TABS
1.0000 | ORAL_TABLET | ORAL | Status: DC | PRN
  Administered 2020-06-26 – 2020-06-27 (×4): 1 via ORAL
  Filled 2020-06-25 (×4): qty 1

## 2020-06-25 MED ORDER — ROCURONIUM BROMIDE 10 MG/ML (PF) SYRINGE
PREFILLED_SYRINGE | INTRAVENOUS | Status: AC
  Filled 2020-06-25: qty 10

## 2020-06-25 MED ORDER — CHLORHEXIDINE GLUCONATE CLOTH 2 % EX PADS: 6.0000 | MEDICATED_PAD | Freq: Once | CUTANEOUS | Status: DC

## 2020-06-25 MED ORDER — ONDANSETRON HCL 4 MG/2ML IJ SOLN
INTRAMUSCULAR | Status: DC | PRN
  Administered 2020-06-25: 4 mg via INTRAVENOUS

## 2020-06-25 MED ORDER — FENTANYL CITRATE (PF) 100 MCG/2ML IJ SOLN: 25.0000 ug | INTRAMUSCULAR | Status: DC | PRN

## 2020-06-25 MED ORDER — CEFAZOLIN SODIUM-DEXTROSE 1-4 GM/50ML-% IV SOLN
1.0000 g | Freq: Three times a day (TID) | INTRAVENOUS | Status: AC
  Administered 2020-06-25 – 2020-06-26 (×2): 1 g via INTRAVENOUS
  Filled 2020-06-25 (×2): qty 50

## 2020-06-25 MED ORDER — LISINOPRIL 20 MG PO TABS
20.0000 mg | ORAL_TABLET | Freq: Every day | ORAL | Status: DC
  Administered 2020-06-26 – 2020-06-28 (×3): 20 mg via ORAL
  Filled 2020-06-25 (×3): qty 1

## 2020-06-25 MED ORDER — SODIUM CHLORIDE 0.9% FLUSH
3.0000 mL | Freq: Two times a day (BID) | INTRAVENOUS | Status: DC
  Administered 2020-06-25 – 2020-06-26 (×2): 3 mL via INTRAVENOUS

## 2020-06-25 MED ORDER — LIDOCAINE HCL (PF) 2 % IJ SOLN
INTRAMUSCULAR | Status: AC
  Filled 2020-06-25: qty 5

## 2020-06-25 MED ORDER — VANCOMYCIN HCL 1000 MG IV SOLR
INTRAVENOUS | Status: AC
  Filled 2020-06-25: qty 1000

## 2020-06-25 MED ORDER — ONDANSETRON HCL 4 MG/2ML IJ SOLN
4.0000 mg | Freq: Once | INTRAMUSCULAR | Status: AC | PRN
  Administered 2020-06-25: 4 mg via INTRAVENOUS

## 2020-06-25 MED ORDER — PHENYLEPHRINE 40 MCG/ML (10ML) SYRINGE FOR IV PUSH (FOR BLOOD PRESSURE SUPPORT)
PREFILLED_SYRINGE | INTRAVENOUS | Status: DC | PRN
  Administered 2020-06-25: 40 ug via INTRAVENOUS

## 2020-06-25 MED ORDER — DOXAZOSIN MESYLATE 8 MG PO TABS
4.0000 mg | ORAL_TABLET | Freq: Every day | ORAL | Status: DC
  Administered 2020-06-25 – 2020-06-27 (×3): 4 mg via ORAL
  Filled 2020-06-25 (×4): qty 1

## 2020-06-25 MED ORDER — SODIUM CHLORIDE 0.9 % IV SOLN: INTRAVENOUS | Status: DC | PRN

## 2020-06-25 MED ORDER — ACETAMINOPHEN 325 MG PO TABS
650.0000 mg | ORAL_TABLET | ORAL | Status: DC | PRN
  Administered 2020-06-26 – 2020-06-27 (×3): 650 mg via ORAL
  Filled 2020-06-25 (×3): qty 2

## 2020-06-25 MED ORDER — CEFAZOLIN SODIUM-DEXTROSE 2-4 GM/100ML-% IV SOLN
2.0000 g | INTRAVENOUS | Status: AC
  Administered 2020-06-25: 2 g via INTRAVENOUS
  Filled 2020-06-25: qty 100

## 2020-06-25 MED ORDER — SUGAMMADEX SODIUM 200 MG/2ML IV SOLN
INTRAVENOUS | Status: DC | PRN
  Administered 2020-06-25 (×2): 100 mg via INTRAVENOUS

## 2020-06-25 MED ORDER — INSULIN ASPART 100 UNIT/ML IJ SOLN
0.0000 [IU] | Freq: Three times a day (TID) | INTRAMUSCULAR | Status: DC
  Administered 2020-06-26: 3 [IU] via SUBCUTANEOUS
  Administered 2020-06-26: 8 [IU] via SUBCUTANEOUS
  Administered 2020-06-26 – 2020-06-27 (×2): 5 [IU] via SUBCUTANEOUS
  Administered 2020-06-27: 2 [IU] via SUBCUTANEOUS

## 2020-06-25 MED ORDER — MIDAZOLAM HCL 2 MG/2ML IJ SOLN
INTRAMUSCULAR | Status: AC
  Filled 2020-06-25: qty 2

## 2020-06-25 MED ORDER — DEXAMETHASONE SODIUM PHOSPHATE 10 MG/ML IJ SOLN
INTRAMUSCULAR | Status: AC
  Filled 2020-06-25: qty 1

## 2020-06-25 MED ORDER — ONDANSETRON HCL 4 MG/2ML IJ SOLN
4.0000 mg | Freq: Four times a day (QID) | INTRAMUSCULAR | Status: DC | PRN
  Administered 2020-06-26 (×2): 4 mg via INTRAVENOUS
  Filled 2020-06-25 (×2): qty 2

## 2020-06-25 MED ORDER — ESMOLOL HCL 100 MG/10ML IV SOLN
INTRAVENOUS | Status: AC
  Filled 2020-06-25: qty 10

## 2020-06-25 MED ORDER — BUPIVACAINE HCL (PF) 0.25 % IJ SOLN: INTRAMUSCULAR | Status: DC | PRN

## 2020-06-25 MED ORDER — ORAL CARE MOUTH RINSE: 15.0000 mL | Freq: Once | OROMUCOSAL | Status: AC

## 2020-06-25 MED ORDER — LORATADINE 10 MG PO TABS
10.0000 mg | ORAL_TABLET | Freq: Every day | ORAL | Status: DC
  Administered 2020-06-26 – 2020-06-28 (×3): 10 mg via ORAL
  Filled 2020-06-25 (×3): qty 1

## 2020-06-25 MED ORDER — PHENYLEPHRINE HCL-NACL 10-0.9 MG/250ML-% IV SOLN
INTRAVENOUS | Status: DC | PRN
  Administered 2020-06-25: 20 ug/min via INTRAVENOUS

## 2020-06-25 MED ORDER — OXYCODONE HCL 5 MG PO TABS
10.0000 mg | ORAL_TABLET | ORAL | Status: DC | PRN
  Administered 2020-06-26 – 2020-06-28 (×5): 10 mg via ORAL
  Filled 2020-06-25 (×5): qty 2

## 2020-06-25 MED ORDER — THROMBIN 20000 UNITS EX SOLR
CUTANEOUS | Status: AC
  Filled 2020-06-25: qty 20000

## 2020-06-25 SURGICAL SUPPLY — 65 items
APPLICATOR CHLORAPREP 3ML ORNG (MISCELLANEOUS) ×2 IMPLANT
BAG DECANTER FOR FLEXI CONT (MISCELLANEOUS) ×2 IMPLANT
BENZOIN TINCTURE PRP APPL 2/3 (GAUZE/BANDAGES/DRESSINGS) ×2 IMPLANT
BLADE CLIPPER SURG (BLADE) IMPLANT
BONE GRAFTON DBF INJECT 6CC (Bone Implant) ×2 IMPLANT
BUR CUTTER 7.0 ROUND (BURR) IMPLANT
BUR MATCHSTICK NEURO 3.0 LAGG (BURR) ×2 IMPLANT
CAGE EXP CATALYFT 9 (Plate) ×4 IMPLANT
CANISTER SUCT 3000ML PPV (MISCELLANEOUS) ×2 IMPLANT
CAP LCK SPNE (Orthopedic Implant) ×6 IMPLANT
CAP LOCK SPINE RADIUS (Orthopedic Implant) ×6 IMPLANT
CAP LOCKING (Orthopedic Implant) ×6 IMPLANT
CARTRIDGE OIL MAESTRO DRILL (MISCELLANEOUS) ×1 IMPLANT
CLSR STERI-STRIP ANTIMIC 1/2X4 (GAUZE/BANDAGES/DRESSINGS) ×2 IMPLANT
CNTNR URN SCR LID CUP LEK RST (MISCELLANEOUS) ×1 IMPLANT
CONT SPEC 4OZ STRL OR WHT (MISCELLANEOUS) ×1
COVER BACK TABLE 60X90IN (DRAPES) ×2 IMPLANT
COVER WAND RF STERILE (DRAPES) ×2 IMPLANT
DECANTER SPIKE VIAL GLASS SM (MISCELLANEOUS) ×2 IMPLANT
DERMABOND ADVANCED (GAUZE/BANDAGES/DRESSINGS) ×1
DERMABOND ADVANCED .7 DNX12 (GAUZE/BANDAGES/DRESSINGS) ×1 IMPLANT
DIFFUSER DRILL AIR PNEUMATIC (MISCELLANEOUS) ×2 IMPLANT
DRAPE C-ARM 42X72 X-RAY (DRAPES) ×4 IMPLANT
DRAPE HALF SHEET 40X57 (DRAPES) IMPLANT
DRAPE LAPAROTOMY 100X72X124 (DRAPES) ×2 IMPLANT
DRAPE SURG 17X23 STRL (DRAPES) ×8 IMPLANT
DRSG OPSITE POSTOP 4X6 (GAUZE/BANDAGES/DRESSINGS) ×2 IMPLANT
DURAPREP 26ML APPLICATOR (WOUND CARE) IMPLANT
ELECT REM PT RETURN 9FT ADLT (ELECTROSURGICAL) ×2
ELECTRODE REM PT RTRN 9FT ADLT (ELECTROSURGICAL) ×1 IMPLANT
EVACUATOR 1/8 PVC DRAIN (DRAIN) IMPLANT
GAUZE 4X4 16PLY RFD (DISPOSABLE) IMPLANT
GAUZE SPONGE 4X4 12PLY STRL (GAUZE/BANDAGES/DRESSINGS) IMPLANT
GLOVE BIO SURGEON STRL SZ 6.5 (GLOVE) ×2 IMPLANT
GLOVE ECLIPSE 9.0 STRL (GLOVE) ×4 IMPLANT
GLOVE EXAM NITRILE XL STR (GLOVE) IMPLANT
GLOVE SURG UNDER POLY LF SZ6.5 (GLOVE) ×2 IMPLANT
GLOVE SURG UNDER POLY LF SZ7 (GLOVE) ×6 IMPLANT
GOWN STRL REUS W/ TWL LRG LVL3 (GOWN DISPOSABLE) ×2 IMPLANT
GOWN STRL REUS W/ TWL XL LVL3 (GOWN DISPOSABLE) ×3 IMPLANT
GOWN STRL REUS W/TWL 2XL LVL3 (GOWN DISPOSABLE) IMPLANT
GOWN STRL REUS W/TWL LRG LVL3 (GOWN DISPOSABLE) ×2
GOWN STRL REUS W/TWL XL LVL3 (GOWN DISPOSABLE) ×3
KIT BASIN OR (CUSTOM PROCEDURE TRAY) ×2 IMPLANT
KIT TURNOVER KIT B (KITS) ×2 IMPLANT
MILL MEDIUM DISP (BLADE) ×2 IMPLANT
NEEDLE HYPO 22GX1.5 SAFETY (NEEDLE) ×2 IMPLANT
NS IRRIG 1000ML POUR BTL (IV SOLUTION) ×2 IMPLANT
OIL CARTRIDGE MAESTRO DRILL (MISCELLANEOUS) ×2
PACK LAMINECTOMY NEURO (CUSTOM PROCEDURE TRAY) ×2 IMPLANT
ROD 70MM (Rod) ×2 IMPLANT
ROD SPNL 70X5.5XNS TI RDS (Rod) ×2 IMPLANT
SCREW 6.75X40MM (Screw) ×2 IMPLANT
SCREW 6.75X45MM (Screw) ×10 IMPLANT
SPONGE LAP 4X18 RFD (DISPOSABLE) ×2 IMPLANT
SPONGE SURGIFOAM ABS GEL 100 (HEMOSTASIS) ×2 IMPLANT
STRIP CLOSURE SKIN 1/2X4 (GAUZE/BANDAGES/DRESSINGS) ×4 IMPLANT
SUT VIC AB 0 CT1 18XCR BRD8 (SUTURE) ×2 IMPLANT
SUT VIC AB 0 CT1 8-18 (SUTURE) ×2
SUT VIC AB 2-0 CT1 18 (SUTURE) ×2 IMPLANT
SUT VIC AB 3-0 SH 8-18 (SUTURE) ×4 IMPLANT
TOWEL GREEN STERILE (TOWEL DISPOSABLE) ×2 IMPLANT
TOWEL GREEN STERILE FF (TOWEL DISPOSABLE) ×2 IMPLANT
TRAY FOLEY MTR SLVR 16FR STAT (SET/KITS/TRAYS/PACK) ×2 IMPLANT
WATER STERILE IRR 1000ML POUR (IV SOLUTION) ×2 IMPLANT

## 2020-06-25 NOTE — Transfer of Care (Signed)
Immediate Anesthesia Transfer of Care Note  Patient: Courtney [name]  Procedure(s) Performed: Posterior Lumbar Interbody Fusion - Lumbar four-Lumbar five (Back)  Patient Location: PACU  Anesthesia Type:General  Level of Consciousness: drowsy and patient cooperative  Airway & Oxygen Therapy: Patient Spontanous Breathing and Patient connected to face mask oxygen  Post-op Assessment: Report given to RN, Post -op Vital signs reviewed and stable and Patient moving all extremities X 4  Post vital signs: Reviewed  Last Vitals:  Vitals Value Taken Time  BP 169/95 06/25/20 1759  Temp    Pulse 69 06/25/20 1804  Resp 6 06/25/20 1804  SpO2 100 % 06/25/20 1804  Vitals shown include unvalidated device data.  Last Pain:  Vitals:   06/25/20 1224  TempSrc:   PainSc: 3       Patients Stated Pain Goal: 3 (72/82/06 0156)  Complications: No notable events documented.

## 2020-06-25 NOTE — Anesthesia Postprocedure Evaluation (Signed)
Anesthesia Post Note  Patient: Courtney [name]  Procedure(s) Performed: Posterior Lumbar Interbody Fusion - Lumbar four-Lumbar five (Back)     Patient location during evaluation: PACU Anesthesia Type: General Level of consciousness: awake and alert Pain management: pain level controlled Vital Signs Assessment: post-procedure vital signs reviewed and stable Respiratory status: spontaneous breathing, nonlabored ventilation, respiratory function stable and patient connected to nasal cannula oxygen Cardiovascular status: blood pressure returned to baseline and stable Postop Assessment: no apparent nausea or vomiting Anesthetic complications: no   No notable events documented.  Last Vitals:  Vitals:   06/25/20 1800 06/25/20 1814  BP:  (!) 180/63  Pulse:  72  Resp:  12  Temp: (!) 36.4 C   SpO2:  96%    Last Pain:  Vitals:   06/25/20 1814  TempSrc:   PainSc: Asleep                 Tarissa Kerin COKER

## 2020-06-25 NOTE — Op Note (Signed)
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  L4-5 bilateral decompressive laminotomies and facetectomies with bilateral L4 and L5 foraminotomies, more than would be required for simple interbody fusion alone.  L4-5 posterior lumbar interbody fusion utilizing interbody expandable cages, morselized autograft, and demineralized bone matrix.  Revision L34 posterior lateral fusion with local autograft  L4-5 posterior letter arthrodesis with local autograft  L3-4-5 posterior lateral arthrodesis utilizing segmental pedicle screw fixation and local autograft  Surgeon:Dartagnan Beavers A.Adalind Weitz, M.D.  Asst. Surgeon: Venetia Constable, MD; Reinaldo Meeker, NP  Anesthesia: General  Indication: 81 year old female with history of prior L3-4 decompression and fusion by another physician presents with severe back and progressive lower extremity symptoms.  Work up demonstrates prior decompression and posterior lateral fusion L34 which appears solid on radiographic examination.  Patient has evidence of a progressive degenerative spondylolisthesis with severe neuroforaminal and lateral recess stenosis at L4-5.  Patient presents now for L4-5 decompression and fusion.    Operative note: After induction of anesthesia, patient position prone on the Wilson frame appropriate padded.  Lumbar region prepped and draped sterilely.  Incision made overlying L3-L5.  Dissection performed bilaterally.  Retractor placed.  Fluoroscopy used.  Levels confirmed.  Previous surgery was complicated by obvious use of large volume BMP.  There were islands of bone formation everywhere.  These were dissected free and resected.  The previously placed pedicle screws rotation at L3-4 was dissected  free, it was disassembled, and it was removed.  Inspection of the L3-4 fusion found that it was not solid.  Decision at that time was to revise the L3-4 fusion in addition to the L4-5 fusion.  Attention then placed to L4-5.  Patient once again had marked amount of heterotopic bone which was resected.  Previous laminotomy on the right at L4-5 was dissected free.  The laminectomy was performed by removing the inferior three quarters of the lamina of L4, the entire inferior facets of L4 bilaterally as well as the pars interarticularis, and the majority the superior facets of L5 bilaterally.  Ligament flavum and epidural scar were elevated and resected.  Disc spaces were dissected free.  Bilateral discectomies were then performed at L4-5.  The space was then entered and cleaned of soft tissue.  The space was distracted and reduced to normal alignment.  With a distractor placed the patient's right side to space was then prepared on the left side.  Soft tissue was removed in the interspace.  A 9 mm [employer] expandable cage was then impacted in place and expanded.  Distractor removed patient's right side.  Morselized autograft was then packed in the interspace.  A second cage was then packed into place and expanded.  Pedicles of L5 were then identified using surface landmarks and intraoperative fluoroscopy of superficial bone around the pedicle was then removed using high-speed drill each pedicle was then probed using pedicle all each pedicle all track was then probed and found to be solidly within the bone.  SPECT all track was then tapped with a screw tap.  Screw hole was probed and found to be solidly within the bone.  6.75 mm radius brand screws from [employer] were then placed bilaterally at L3-L4 and L5 with good purchase and good position on fluoroscopy.  Final images reveal good position the cages and the hardware at  the proper upper level with normal alignment of spine.  Short segment titanium rod placed  over the screw heads from L3-L5.  Locking caps placed over the screw and locking caps and engaged with construct and compression.  Transverse processes of L3-4 and 5 were decorticated.  Morselized autograft was packed posterior laterally.  Vancomycin powder was placed in the deep wound space.  Wounds and closed in layers with Vicryl sutures.  Steri-Strips and sterile dressing were applied.  No apparent complications.  Patient tolerated the procedure well and she returns to the recovery room postop.

## 2020-06-25 NOTE — Anesthesia Procedure Notes (Addendum)
Procedure Name: Intubation Date/Time: 06/25/2020 2:26 PM Performed by: Daiva Huge, RN Pre-anesthesia Checklist: Patient identified, Emergency Drugs available, Suction available and Patient being monitored Patient Re-evaluated:Patient Re-evaluated prior to induction Oxygen Delivery Method: Circle system utilized Preoxygenation: Pre-oxygenation with 100% oxygen Induction Type: IV induction Ventilation: Mask ventilation without difficulty Laryngoscope Size: Mac and 3 Grade View: Grade I Tube type: Oral Tube size: 7.0 mm Number of attempts: 1 Airway Equipment and Method: Stylet Placement Confirmation: ETT inserted through vocal cords under direct vision, positive ETCO2 and breath sounds checked- equal and bilateral Secured at: 21 cm Tube secured with: Tape Dental Injury: Teeth and Oropharynx as per pre-operative assessment  Comments: Intubation by [facility]

## 2020-06-25 NOTE — Progress Notes (Signed)
Orthopedic Tech Progress Note Patient Details:  Courtney [name] 1940/03/26 [id number] Patient has brace Patient ID: Courtney [name], female   DOB: 27-Mar-1940, 80 y.o.   MRN: [id number]  Courtney [name] 06/25/2020, 10:31 PM

## 2020-06-25 NOTE — H&P (Signed)
Courtney [name] is an 80 y.o. female.   Chief Complaint: Back pain HPI: 80 year old female followed remotely status post L3-4 decompression and fusion with good results presents with worsening back pain with radiation to her lower extremities failed conservative management.  Work-up demonstrates evidence of progressive disc degeneration with degenerative anterolisthesis of L4 and L5 with significant lateral recess and foraminal stenosis.  Patient is now for L4-5 decompression and fusion in hopes of improving her symptoms.  Past Medical History:  Diagnosis Date   Arthritis    rheumatoid arthritis   B12 deficiency    Cancer ([place])    skin cancer (squamous cell)   Collagen vascular disease (HCC)    RA   Dermatitis    Diabetes mellitus without complication ([place])    type 2   Eczema    Fibrosis, pulmonary, interstitial, diffuse (HCC)    GERD (gastroesophageal reflux disease)    Headache    migraines in the past (none since menopause)   History of hiatal hernia    Hives    "chronic"   Hypertension    Loose stools    Osteoarthritis    lumbar spine, cervical spine, plantar fascitis    Pneumonia    PONV (postoperative nausea and vomiting)    Psoriasis    Rosacea    Sleep apnea 2004   does not use cpap   Urethral stenosis    w/ bladder polyps. followed by Dr Madelin Headings    Urticaria    chronic    Varicose veins     Past Surgical History:  Procedure Laterality Date   ANTERIOR CERVICAL DECOMP/DISCECTOMY FUSION N/A 08/20/2017   Procedure: Anterior Cervical Decompression Fusion - Cervical Four - Cervical Five - Cervical Five - Cervical Six;  Surgeon: Earnie Larsson, MD;  Location: [facility];  Service: Neurosurgery;  Laterality: N/A;  Anterior Cervical Decompression Fusion - Cervical Four - Cervical Five - Cervical Five - Cervical Six   APPENDECTOMY     BILATERAL CARPAL TUNNEL RELEASE     BREAST CYST ASPIRATION Left    neg   CHOLECYSTECTOMY  1963   COLONOSCOPY WITH PROPOFOL N/A 12/31/2015    Procedure: COLONOSCOPY WITH PROPOFOL;  Surgeon: Lollie Sails, MD;  Location: [facility] ENDOSCOPY;  Service: Endoscopy;  Laterality: N/A;   ENDOMETRIAL ABLATION  1991   ESOPHAGOGASTRODUODENOSCOPY (EGD) WITH PROPOFOL N/A 12/31/2015   Procedure: ESOPHAGOGASTRODUODENOSCOPY (EGD) WITH PROPOFOL;  Surgeon: Lollie Sails, MD;  Location: [facility] ENDOSCOPY;  Service: Endoscopy;  Laterality: N/A;   ESOPHAGOGASTRODUODENOSCOPY (EGD) WITH PROPOFOL N/A 06/02/2016   Procedure: ESOPHAGOGASTRODUODENOSCOPY (EGD) WITH PROPOFOL;  Surgeon: Lollie Sails, MD;  Location[facility] ENDOSCOPY;  Service: Endoscopy;  Laterality: N/A;   ESOPHAGOGASTRODUODENOSCOPY (EGD) WITH PROPOFOL N/A 11/07/2016   Procedure: ESOPHAGOGASTRODUODENOSCOPY (EGD) WITH PROPOFOL;  Surgeon: Lollie Sails, MD;  Location: [facility] ENDOSCOPY;  Service: Endoscopy;  Laterality: N/A;   EYE SURGERY Bilateral 2006 and 2012   cataract surgery with lens implant   eyelid surgery Bilateral 2014   FOOT SURGERY Right    ligament and spurs   LUMBAR LAMINECTOMY/DECOMPRESSION MICRODISCECTOMY Right 12/24/2013   Procedure: RIGHT LUMBAR THREE TO FOUR, LUMBAR FOUR TO FIVE, LUMBAR FIVE TO SACRAL ONE LAMINECTOMY/FORAMINOTOMY;  Surgeon: Floyce Stakes, MD;  Location: [facility];  Service: Neurosurgery;  Laterality: Right;  RIGHT L3-4 L4-5 L5-S1 LAMINECTOMY/FORAMINOTOMY   NASAL SEPTUM SURGERY  2004   SHOULDER SURGERY Right    for a frozen shoulder   SKIN CANCER EXCISION Right    leg x 4  skin cancer removal     TONSILLECTOMY     TUBAL LIGATION  1968    Family History  Problem Relation Age of Onset   COPD Mother    Arthritis/Rheumatoid Mother    Alzheimer's disease Father    Heart attack Father    Breast cancer Paternal Aunt 27   Social History:  reports that she has never smoked. She has never used smokeless tobacco. She reports that she does not drink alcohol and does not use drugs.  Allergies:  Allergies  Allergen Reactions   Methotrexate  Derivatives Shortness Of Breath    Breathing difficulties    Acyclovir And Related Rash   Codeine Nausea Only   Contrast Media [Iodinated Diagnostic Agents] Itching and Rash   Inderide [Propranolol-Hctz] Rash   Iodine Rash   Lodine [Etodolac] Rash   Metrizamide Itching and Rash   Procardia [Nifedipine] Rash    Medications Prior to Admission  Medication Sig Dispense Refill   acetaminophen (TYLENOL) 500 MG tablet Take 500 mg by mouth every 6 (six) hours as needed (for pain.).      bisoprolol (ZEBETA) 10 MG tablet Take 1 tablet (10 mg total) by mouth daily. 30 tablet 1   cetirizine (ZYRTEC) 10 MG tablet Take 10 mg by mouth at bedtime.      doxazosin (CARDURA) 4 MG tablet Take 4 mg by mouth at bedtime.     HYDROcodone-acetaminophen (NORCO/VICODIN) 5-325 MG tablet Take 1 tablet by mouth 2 (two) times daily as needed (for pain.).     inFLIXimab (REMICADE) 100 MG injection Inject 500 mg into the vein every 6 (six) weeks.     insulin NPH Human (HUMULIN N,NOVOLIN N) 100 UNIT/ML injection Inject 0.4 mLs (40 Units total) into the skin daily before breakfast. (Patient taking differently: Inject 30-40 Units into the skin See admin instructions. 40 units in the morning & 30 units in the at night) 10 mL 11   leflunomide (ARAVA) 10 MG tablet Take 10 mg by mouth daily.     levocetirizine (XYZAL) 5 MG tablet Take 5 mg by mouth at bedtime.     lisinopril (PRINIVIL,ZESTRIL) 20 MG tablet Take 20 mg by mouth daily.     montelukast (SINGULAIR) 10 MG tablet Take 10 mg by mouth daily.     pantoprazole (PROTONIX) 40 MG tablet Take 40 mg by mouth daily.     simvastatin (ZOCOR) 40 MG tablet Take 40 mg by mouth at bedtime.     aspirin EC 81 MG tablet Take 81 mg by mouth daily.     spironolactone (ALDACTONE) 25 MG tablet Take 25 mg by mouth daily.      Results for orders placed or performed during the hospital encounter of 06/25/20 (from the past 48 hour(s))  Glucose, capillary     Status: None   Collection Time:  06/25/20 11:52 AM  Result Value Ref Range   Glucose-Capillary 86 70 - 99 mg/dL    Comment: Glucose reference range applies only to samples taken after fasting for at least 8 hours.   No results found.  Pertinent items noted in HPI and remainder of comprehensive ROS otherwise negative.  Blood pressure (!) 181/62, pulse 73, temperature 98.7 F (37.1 C), temperature source Oral, resp. rate 17, height 5\' 5"  (1.651 m), weight 85.4 kg, SpO2 96 %.  Patient is awake and alert.  She is oriented and appropriate.  Speech is fluent.  Judgment insight are intact.  Cranial nerve function normal bilateral motor examination reveals 5/5  strength bilateral.  Sensory examination nonfocal.  Gait antalgic.  Posture is mildly flexed peer examination head ears eyes and throat is unremarked.  Chest and abdomen benign.  Extremities are free from injury deformity. 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  Plan L3-4-5 decompression and fusion utilizing interbody cages, AUTOGRAFT, AND AUGMENTED WITH POSTERIOR LATERAL THESIS UTILIZING NONSEGMENTAL PEDICLE SCREW FIXATION.  RISKS AND BENEFITS BEEN EXPLAINED.  PATIENT WISHES TO PROCEED.  Mallie Mussel A Trevonn Hallum 06/25/2020, 1:40 PM

## 2020-06-25 NOTE — Brief Op Note (Signed)
06/25/2020  5:42 PM  PATIENT:  Courtney [name]  80 y.o. female  PRE-OPERATIVE DIAGNOSIS:  Spondylolisthesis  POST-OPERATIVE DIAGNOSIS:  Spondylolisthesis  PROCEDURE:  Procedure(s): Posterior Lumbar Interbody Fusion - Lumbar four-Lumbar five (N/A)  SURGEON:  Surgeon(s) and Role:    * Earnie Larsson, MD - Primary    * Ostergard, Joyice Faster, MD - Assisting  PHYSICIAN ASSISTANT:   ASSISTANTS: Bergman<NP   ANESTHESIA:   general  EBL:  300 mL   BLOOD ADMINISTERED:none  DRAINS: none   LOCAL MEDICATIONS USED:  MARCAINE     SPECIMEN:  No Specimen  DISPOSITION OF SPECIMEN:  N/A  COUNTS:  YES  TOURNIQUET:  * No tourniquets in log *  DICTATION: .Dragon Dictation  PLAN OF CARE: Admit for overnight observation  PATIENT DISPOSITION:  PACU - hemodynamically stable.   Delay start of Pharmacological VTE agent (>24hrs) due to surgical blood loss or risk of bleeding: yes

## 2020-06-26 ENCOUNTER — Other Ambulatory Visit: Payer: Self-pay

## 2020-06-26 DIAGNOSIS — M4316 Spondylolisthesis, lumbar region: Secondary | ICD-10-CM | POA: Diagnosis not present

## 2020-06-26 DIAGNOSIS — M48061 Spinal stenosis, lumbar region without neurogenic claudication: Secondary | ICD-10-CM | POA: Diagnosis not present

## 2020-06-26 DIAGNOSIS — Z803 Family history of malignant neoplasm of breast: Secondary | ICD-10-CM | POA: Diagnosis not present

## 2020-06-26 DIAGNOSIS — E119 Type 2 diabetes mellitus without complications: Secondary | ICD-10-CM | POA: Diagnosis not present

## 2020-06-26 DIAGNOSIS — Z885 Allergy status to narcotic agent status: Secondary | ICD-10-CM | POA: Diagnosis not present

## 2020-06-26 DIAGNOSIS — Z20822 Contact with and (suspected) exposure to covid-19: Secondary | ICD-10-CM | POA: Diagnosis not present

## 2020-06-26 DIAGNOSIS — M4726 Other spondylosis with radiculopathy, lumbar region: Secondary | ICD-10-CM | POA: Diagnosis not present

## 2020-06-26 DIAGNOSIS — M1990 Unspecified osteoarthritis, unspecified site: Secondary | ICD-10-CM | POA: Diagnosis present

## 2020-06-26 DIAGNOSIS — Z9851 Tubal ligation status: Secondary | ICD-10-CM | POA: Diagnosis not present

## 2020-06-26 DIAGNOSIS — K219 Gastro-esophageal reflux disease without esophagitis: Secondary | ICD-10-CM | POA: Diagnosis present

## 2020-06-26 DIAGNOSIS — Z91041 Radiographic dye allergy status: Secondary | ICD-10-CM | POA: Diagnosis not present

## 2020-06-26 DIAGNOSIS — R110 Nausea: Secondary | ICD-10-CM | POA: Diagnosis not present

## 2020-06-26 DIAGNOSIS — L409 Psoriasis, unspecified: Secondary | ICD-10-CM | POA: Diagnosis present

## 2020-06-26 DIAGNOSIS — Z825 Family history of asthma and other chronic lower respiratory diseases: Secondary | ICD-10-CM | POA: Diagnosis not present

## 2020-06-26 DIAGNOSIS — Y838 Other surgical procedures as the cause of abnormal reaction of the patient, or of later complication, without mention of misadventure at the time of the procedure: Secondary | ICD-10-CM | POA: Diagnosis present

## 2020-06-26 DIAGNOSIS — M960 Pseudarthrosis after fusion or arthrodesis: Secondary | ICD-10-CM | POA: Diagnosis not present

## 2020-06-26 DIAGNOSIS — Z794 Long term (current) use of insulin: Secondary | ICD-10-CM | POA: Diagnosis not present

## 2020-06-26 DIAGNOSIS — Z981 Arthrodesis status: Secondary | ICD-10-CM | POA: Diagnosis not present

## 2020-06-26 DIAGNOSIS — L719 Rosacea, unspecified: Secondary | ICD-10-CM | POA: Diagnosis present

## 2020-06-26 DIAGNOSIS — M069 Rheumatoid arthritis, unspecified: Secondary | ICD-10-CM | POA: Diagnosis not present

## 2020-06-26 DIAGNOSIS — Z8249 Family history of ischemic heart disease and other diseases of the circulatory system: Secondary | ICD-10-CM | POA: Diagnosis not present

## 2020-06-26 DIAGNOSIS — I10 Essential (primary) hypertension: Secondary | ICD-10-CM | POA: Diagnosis not present

## 2020-06-26 DIAGNOSIS — Z820 Family history of epilepsy and other diseases of the nervous system: Secondary | ICD-10-CM | POA: Diagnosis not present

## 2020-06-26 DIAGNOSIS — Z888 Allergy status to other drugs, medicaments and biological substances status: Secondary | ICD-10-CM | POA: Diagnosis not present

## 2020-06-26 DIAGNOSIS — Z7982 Long term (current) use of aspirin: Secondary | ICD-10-CM | POA: Diagnosis not present

## 2020-06-26 LAB — GLUCOSE, CAPILLARY
Glucose-Capillary: 253 mg/dL — ABNORMAL HIGH (ref 70–99)
Glucose-Capillary: 212 mg/dL — ABNORMAL HIGH (ref 70–99)
Glucose-Capillary: 159 mg/dL — ABNORMAL HIGH (ref 70–99)
Glucose-Capillary: 100 mg/dL — ABNORMAL HIGH (ref 70–99)

## 2020-06-26 MED ORDER — DEXAMETHASONE SODIUM PHOSPHATE 4 MG/ML IJ SOLN
4.0000 mg | Freq: Once | INTRAMUSCULAR | Status: AC
  Administered 2020-06-26: 4 mg via INTRAMUSCULAR
  Filled 2020-06-26: qty 1

## 2020-06-26 MED ORDER — KETOROLAC TROMETHAMINE 30 MG/ML IJ SOLN
30.0000 mg | Freq: Three times a day (TID) | INTRAMUSCULAR | Status: AC
  Administered 2020-06-26 (×2): 30 mg via INTRAVENOUS
  Filled 2020-06-26 (×2): qty 1

## 2020-06-26 MED ORDER — HYDROXYZINE HCL 50 MG/ML IM SOLN
50.0000 mg | Freq: Four times a day (QID) | INTRAMUSCULAR | Status: DC | PRN
  Administered 2020-06-26: 50 mg via INTRAMUSCULAR
  Filled 2020-06-26 (×2): qty 1

## 2020-06-26 NOTE — Evaluation (Signed)
Physical Therapy Evaluation Patient Details Name: Courtney [name] MRN: [id number] DOB: 12/22/1940 Today's Date: 06/26/2020   History of Present Illness  Pt is an 80 y/o female s/p PLIF L4-L5 with re-exploration of L3-L4 fusion and removal of hardware. PMH including but not limited to recent L3-L4 decompression and fusion (2020), HTN and DM.   Clinical Impression  Pt presented seated upright in recliner chair, awake and willing to participate in therapy session. Prior to admission, pt reported that she was able to ambulate independently within her home and utilized a cane to walk community distances. Pt lives with her spouse in a two level home (bonus room upstairs) with a couple of steps to enter. At the time of evaluation, pt moving very slowly and greatly limited secondary to significant nausea and episodes of emesis. RN is aware. Pt ambulated within her room, to and from the bathroom with min guard from PT for safety and use of RW. PT reviewed back precautions with pt as related to functional mobility throughout the session. PT will continue to follow-up with pt acutely and plan for stair training at next session if appropriate.     Follow Up Recommendations Supervision for mobility/OOB    Equipment Recommendations  None recommended by PT    Recommendations for Other Services       Precautions / Restrictions Precautions Precautions: Fall;Back Precaution Booklet Issued: Yes (comment) Precaution Comments: Patient able to recall 3/3 back preacutions from previous surgery. Required Braces or Orthoses: Spinal Brace Spinal Brace: Lumbar corset Restrictions Weight Bearing Restrictions: No      Mobility  Bed Mobility Overal bed mobility: Needs Assistance             General bed mobility comments: pt OOB in recliner chair upon PT arrival    Transfers Overall transfer level: Needs assistance Equipment used: Rolling walker (2 wheeled) Transfers: Sit to/from Stand Sit to Stand:  Supervision         General transfer comment: supervision for safety; pt able to stand from recliner chair x1 and from toilet x1 without difficulties and appropriate hand positioning  Ambulation/Gait Ambulation/Gait assistance: Min guard Gait Distance (Feet): 40 Feet (20' x2) Assistive device: Rolling walker (2 wheeled) Gait Pattern/deviations: Step-through pattern;Decreased step length - right;Decreased step length - left;Decreased stride length;Shuffle;Trunk flexed Gait velocity: decreased   General Gait Details: pt with very slow, guarded gait, utilizing very short step lengths bilaterally; greatly limited secondary to significant nausea  Stairs            Wheelchair Mobility    Modified Rankin (Stroke Patients Only)       Balance Overall balance assessment: Needs assistance Sitting-balance support: No upper extremity supported;Feet supported Sitting balance-Leahy Scale: Fair   Postural control: Posterior lean Standing balance support: No upper extremity supported;During functional activity Standing balance-Leahy Scale: Fair Standing balance comment: pt able to don/doff her pants and underwear to use the bathroom with supervision only                             Pertinent Vitals/Pain Pain Assessment: 0-10 Pain Score: 4  Pain Location: surgical site Pain Descriptors / Indicators: Grimacing;Guarding Pain Intervention(s): Monitored during session;Repositioned    Home Living Family/patient expects to be discharged to:: Private residence Living Arrangements: Spouse/significant other Available Help at Discharge: Family;Available 24 hours/day Type of Home: House Home Access: Stairs to enter Entrance Stairs-Rails: Right;Left;Can reach both Entrance Stairs-Number of Steps: 2 Home Layout: One  level Home Equipment: Walker - 4 wheels;Cane - quad;Cane - single point;Shower seat;Shower seat - built in      Prior Function Level of Independence: Needs  assistance   Gait / Transfers Assistance Needed: Independent with household mobility; reports use of SPC in community dwellings.  ADL's / Homemaking Assistance Needed: Independent with ADLs; manages medications independently; husband provides limited transportation        Hand Dominance   Dominant Hand: Right    Extremity/Trunk Assessment   Upper Extremity Assessment Upper Extremity Assessment: Defer to OT evaluation    Lower Extremity Assessment Lower Extremity Assessment: Generalized weakness    Cervical / Trunk Assessment Cervical / Trunk Assessment: Kyphotic;Other exceptions Cervical / Trunk Exceptions: s/p lumbar surgery  Communication   Communication: No difficulties  Cognition Arousal/Alertness: Awake/alert Behavior During Therapy: WFL for tasks assessed/performed Overall Cognitive Status: Within Functional Limits for tasks assessed                                 General Comments: Patient in tears during session. States she thought she'd perform better after surgery. Emotional support provided.      General Comments General comments (skin integrity, edema, etc.): BP 170/62, HR 119bpm, and SpO2 100% on RA. Session limited to bed to chair only secondary to HTN. RN aware.    Exercises     Assessment/Plan    PT Assessment Patient needs continued PT services  PT Problem List Decreased activity tolerance;Decreased balance;Decreased mobility;Decreased coordination;Decreased safety awareness;Decreased knowledge of precautions;Pain       PT Treatment Interventions DME instruction;Gait training;Stair training;Functional mobility training;Therapeutic activities;Therapeutic exercise;Balance training;Neuromuscular re-education;Patient/family education    PT Goals (Current goals can be found in the Care Plan section)  Acute Rehab PT Goals Patient Stated Goal: "home tomorrow" PT Goal Formulation: With patient/family Time For Goal Achievement:  07/10/20 Potential to Achieve Goals: Good    Frequency Min 5X/week   Barriers to discharge        Co-evaluation               AM-PAC PT "6 Clicks" Mobility  Outcome Measure Help needed turning from your back to your side while in a flat bed without using bedrails?: None Help needed moving from lying on your back to sitting on the side of a flat bed without using bedrails?: None Help needed moving to and from a bed to a chair (including a wheelchair)?: None Help needed standing up from a chair using your arms (e.g., wheelchair or bedside chair)?: None Help needed to walk in hospital room?: A Little Help needed climbing 3-5 steps with a railing? : A Little 6 Click Score: 22    End of Session Equipment Utilized During Treatment: Back brace Activity Tolerance: Other (comment) (nausea and vomiting) Patient left: in chair;with call bell/phone within reach;with family/visitor present Nurse Communication: Mobility status PT Visit Diagnosis: Other abnormalities of gait and mobility (R26.89)    Time: 4270-6237 PT Time Calculation (min) (ACUTE ONLY): 24 min   Charges:   PT Evaluation $PT Eval Low Complexity: 1 Low PT Treatments $Therapeutic Activity: 8-22 mins        Anastasio Champion, DPT  Acute Rehabilitation Services Pager [phone] Office [place] 06/26/2020, 9:36 AM

## 2020-06-26 NOTE — Progress Notes (Signed)
Neurosurgery Service Progress Note  Subjective: No acute events overnight, HTN post-op, complaining this morning of pain all over - bilateral arms and legs with diffuse tenderness in shoulders / ankles / knees / back, she thinks her back pain is improved from preop, but having nausea, difficulty with sleep last night   Objective: Vitals:   06/25/20 1959 06/25/20 2302 06/26/20 0309 06/26/20 0740  BP: (!) 186/70 135/88 (!) 171/60 (!) 170/62  Pulse: 82 84 89 (!) 110  Resp: 18 18 20 17   Temp: 97.6 F (36.4 C) (!) 97.5 F (36.4 C) 98.6 F (37 C) 97.9 F (36.6 C)  TempSrc: Oral Oral Oral Oral  SpO2: 100% 100% 97% 99%  Weight:      Height:        Physical Exam: Strength pain limited everywhere, but appears to be 4+/5 in BUE diffusely and 4+/5 in BLE diffusely except 4/5 in R PF, +baseline stocking distribution numbness, incision c/d/i  Assessment & Plan: 80 y.o. woman s/p 3-4/4-5 PLIF / revision.  -will keep inpatient given pain / difficulty ambulating / nausea -permissive HTN up to 180, states she has Bps running in the 120s at home on her baseline regimen, will treat pain and allow this to normalize -transfer to floor bed given 3C closing   Judith Part  06/26/20 7:52 AM

## 2020-06-26 NOTE — Progress Notes (Signed)
Patient is transferred from room 3C09 to unit 4NP07 at this time. Alert and in stable condition. Report given to receiving nurse Bella Kennedy, RN with all questions answered. Left unit via wheelchair with all belongings, son and spouse at side.

## 2020-06-26 NOTE — Evaluation (Signed)
Occupational Therapy Evaluation Patient Details Name: Courtney [name] MRN: [id number] DOB: [birth year] Today's Date: 06/26/2020    History of Present Illness 80 y.o. 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 PMHx significant for previous spinal surgery with most recent being L3-4 decompression and fusion 12/2018, HTN, Hx skin cancer, DMII, OA, and OSA.   Clinical Impression   PTA patient was living with her spouse and was independent with ADLs without AD. Patient reports hired help for heavy housekeeping every other week. Patient also reports use of SPC in community dwellings and states that her husband and daughter provide transportation. Patient currently presents below baseline level of function demonstrating LB ADLs, functional transfers and mobility with Min guard to Min A grossly with AE and RW. OT provided education on spinal precautions, home set-up to maximize safety and independence with self-care tasks, and acquisition/use of AE. Patient expressed verbal understanding but would benefit from further education. OT to continue to follow acutely in prep for safe d/c home.      Follow Up Recommendations  Home health OT;Supervision/Assistance - 24 hour    Equipment Recommendations  None recommended by OT (Patient has necessary DME.)    Recommendations for Other Services       Precautions / Restrictions Precautions Precautions: Fall;Back Precaution Booklet Issued: Yes (comment) Precaution Comments: Patient able to recall 3/3 back preacutions from previous surgery. Required Braces or Orthoses: Spinal Brace Spinal Brace: Lumbar corset Restrictions Weight Bearing Restrictions: No      Mobility Bed Mobility Overal bed mobility: Needs Assistance             General bed mobility comments: Patient seated EOB upon entry.    Transfers Overall  transfer level: Needs assistance Equipment used: Rolling walker (2 wheeled) Transfers: Sit to/from Stand Sit to Stand: Min assist         General transfer comment: Min A from EOB positioned in lowest setting. Cues for hand placement.    Balance Overall balance assessment: Needs assistance Sitting-balance support: No upper extremity supported;Feet supported Sitting balance-Leahy Scale: Fair   Postural control: Posterior lean Standing balance support: Bilateral upper extremity supported;During functional activity Standing balance-Leahy Scale: Fair Standing balance comment: Reliant on BUE on RW with dynamic activities.                           ADL either performed or assessed with clinical judgement   ADL Overall ADL's : Needs assistance/impaired     Grooming: Set up;Sitting           Upper Body Dressing : Set up;Sitting Upper Body Dressing Details (indicate cue type and reason): Donned UB clothing and lumbar brace seated EOB. Lower Body Dressing: Minimal assistance;Sit to/from stand Lower Body Dressing Details (indicate cue type and reason): Min A for steadying in standing. Use of AE to maximize independence. Patient able to cross LLE over R knee but unable to cross RLE over L knee. Toilet Transfer: Designer, television/film set Details (indicate cue type and reason): Simulated with transfer to recliner and Min guard.         Functional mobility during ADLs: Min guard;Rolling walker General ADL Comments: Patient limited by pain in low back, genearlized weakness adn anxiety this date.     Vision Patient Visual Report: No change from baseline       Perception  Praxis      Pertinent Vitals/Pain Pain Assessment: 0-10 Pain Score: 7  Pain Location: Low back and bilateral legs from hip to calf. Pain Descriptors / Indicators: Throbbing Pain Intervention(s): Limited activity within patient's tolerance;Monitored during session;Repositioned;RN gave pain  meds during session     Hand Dominance Right   Extremity/Trunk Assessment Upper Extremity Assessment Upper Extremity Assessment: Generalized weakness   Lower Extremity Assessment Lower Extremity Assessment: Defer to PT evaluation   Cervical / Trunk Assessment Cervical / Trunk Assessment: Kyphotic;Other exceptions Cervical / Trunk Exceptions: s/p lumbar surgery   Communication Communication Communication: No difficulties   Cognition Arousal/Alertness: Awake/alert Behavior During Therapy: Anxious Overall Cognitive Status: Within Functional Limits for tasks assessed                                 General Comments: Patient in tears during session. States she thought she'd perform better after surgery. Emotional support provided.   General Comments  BP 170/62, HR 119bpm, and SpO2 100% on RA. Session limited to bed to chair only secondary to HTN. RN aware.    Exercises     Shoulder Instructions      Home Living Family/patient expects to be discharged to:: Private residence Living Arrangements: Spouse/significant other Available Help at Discharge: Family;Available 24 hours/day Type of Home: House Home Access: Stairs to enter [employer] of Steps: 2 Entrance Stairs-Rails: Right;Left;Can reach both Home Layout: One level     Bathroom Shower/Tub: Occupational psychologist: Handicapped height     Home Equipment: Environmental consultant - 4 wheels;Cane - quad;Cane - single point;Shower seat;Shower seat - built in          Prior Functioning/Environment Level of Independence: Needs assistance  Gait / Transfers Assistance Needed: I with household mobility; reports use of SPC in community dwellings. ADL's / Homemaking Assistance Needed: I with ADLs; manages medications independently; husband provides limited transportation            OT Problem List: Decreased strength;Decreased activity tolerance;Impaired balance (sitting and/or standing);Pain       OT Treatment/Interventions: Self-care/ADL training;Therapeutic exercise;Energy conservation;DME and/or AE instruction;Patient/family education;Balance training;Therapeutic activities    OT Goals(Current goals can be found in the care plan section) Acute Rehab OT Goals Patient Stated Goal: To return home. OT Goal Formulation: With patient Time For Goal Achievement: 07/10/20 Potential to Achieve Goals: Good ADL Goals Additional ADL Goal #1: Patient will complete ADLs with Mod I, AD/AE PRN and good adherence to spinal precautions. Additional ADL Goal #2: Patient will verbalize 3 technique to reduce risk of falls in prep for safe return home. Additional ADL Goal #3: Patient will recall and demo 3 energy conservation techniques in prep for ADLs/IADLs.  OT Frequency: Min 2X/week   Barriers to D/C:            Co-evaluation              AM-PAC OT "6 Clicks" Daily Activity     Outcome Measure Help from another person eating meals?: None Help from another person taking care of personal grooming?: A Little Help from another person toileting, which includes using toliet, bedpan, or urinal?: A Little Help from another person bathing (including washing, rinsing, drying)?: A Lot Help from another person to put on and taking off regular upper body clothing?: A Little Help from another person to put on and taking off regular lower body clothing?: A Little 6 Click Score:  18   End of Session Equipment Utilized During Treatment: Gait belt;Rolling walker;Back brace Nurse Communication: Mobility status;Other (comment) (Response to treatment)  Activity Tolerance: Patient tolerated treatment well;Treatment limited secondary to medical complications (Comment) (Hypertension) Patient left: in chair;with call bell/phone within reach  OT Visit Diagnosis: Unsteadiness on feet (R26.81);Muscle weakness (generalized) (M62.81)                Time: 7373-6681 OT Time Calculation (min): 32 min Charges:  OT  General Charges $OT Visit: 1 Visit OT Evaluation $OT Eval Moderate Complexity: 1 Mod OT Treatments $Self Care/Home Management : 8-22 mins  Shital Crayton H. OTR/L Supplemental OT, Department of rehab services [phone]  Corinda Ammon R H. 06/26/2020, 8:22 AM

## 2020-06-26 NOTE — Progress Notes (Signed)
Patient was transferred from PACU to [facility] with a systolic blood pressure in the 180's throughout her stay in the area. During the report prior the transfer, receiving RN addressed about her elevated SBP if she was given medication to treat it and PACU RN stated she was not given any as per no documentation noted. Receiving RN asked again if MD is aware of her blood pressure and she stated that MD is aware. Pt was asymptomatic except for her complaints of muscle cramps on both legs, nausea and vomiting, and minor surgical pain on her back. All of these symptoms were all treated with medications as per documented on MAR. At 2300 vitals, her BP went down to 135/88; but around 3am, went up to 171/60. Requested pharmacy to send her blood pressure medications the soonest. Will continue to monitor patient and will endorse to incoming RN the progress of events.Marland Kitchen

## 2020-06-27 LAB — GLUCOSE, CAPILLARY
Glucose-Capillary: 115 mg/dL — ABNORMAL HIGH (ref 70–99)
Glucose-Capillary: 255 mg/dL — ABNORMAL HIGH (ref 70–99)
Glucose-Capillary: 148 mg/dL — ABNORMAL HIGH (ref 70–99)
Glucose-Capillary: 152 mg/dL — ABNORMAL HIGH (ref 70–99)

## 2020-06-27 NOTE — Progress Notes (Signed)
Patient's CBG at 115 with 40U of NPH to admin. Pt declined insulin at this time and voices that if her blood sugar is this low, she usually skip it until lunch time, pt only eats approx 40% of breakfast this AM. Will discuss with MD on round r/t her insulin.   Patient currently in chair, c/o discomfort 8/10 back pain. Meds given. Will cont to monitor.

## 2020-06-27 NOTE — Progress Notes (Signed)
Physical Therapy Treatment Patient Details Name: Courtney [name] MRN: [id number] DOB: 10-17-1940 Today's Date: 06/27/2020    History of Present Illness Pt is an 80 y/o female s/p PLIF L4-L5 with re-exploration of L3-L4 fusion and removal of hardware. PMH including but not limited to recent L3-L4 decompression and fusion (2020), HTN and DM.    PT Comments    Pt with resolution of nausea/vomiting and reports improved pain control, although she continues with radicular pain to BLE's. Pt progressing well towards her physical therapy goals. Ambulating x 160 feet with a walker at a supervision level. Will continue to follow acutely.     Follow Up Recommendations  Supervision for mobility/OOB;No PT follow up     Equipment Recommendations  None recommended by PT    Recommendations for Other Services       Precautions / Restrictions Precautions Precautions: Fall;Back Precaution Booklet Issued: Yes (comment) Required Braces or Orthoses: Spinal Brace Spinal Brace: Lumbar corset Restrictions Weight Bearing Restrictions: No    Mobility  Bed Mobility Overal bed mobility: Modified Independent             General bed mobility comments: HOB elevated 17 degrees, good log roll technique    Transfers Overall transfer level: Needs assistance Equipment used: Rolling walker (2 wheeled) Transfers: Sit to/from Stand Sit to Stand: Supervision         General transfer comment: Standing from edge of bed and toilet without difficulty  Ambulation/Gait Ambulation/Gait assistance: Supervision Gait Distance (Feet): 160 Feet Assistive device: Rolling walker (2 wheeled) Gait Pattern/deviations: Step-through pattern;Decreased stride length;Shuffle;Trunk flexed Gait velocity: decreased   General Gait Details: Cues for upright posture and upward gaze, kyphotic posture and increased LLE external rotation noted, no overt LOB. Supervision for safety   Stairs             Wheelchair  Mobility    Modified Rankin (Stroke Patients Only)       Balance Overall balance assessment: Needs assistance Sitting-balance support: No upper extremity supported;Feet supported Sitting balance-Leahy Scale: Good     Standing balance support: No upper extremity supported;During functional activity Standing balance-Leahy Scale: Fair                              Cognition Arousal/Alertness: Awake/alert Behavior During Therapy: WFL for tasks assessed/performed Overall Cognitive Status: Within Functional Limits for tasks assessed                                        Exercises      General Comments        Pertinent Vitals/Pain Pain Assessment: Faces Faces Pain Scale: Hurts a little bit Pain Location: surgical site, radicular pain to BLE's Pain Descriptors / Indicators: Grimacing;Guarding Pain Intervention(s): Monitored during session    Home Living                      Prior Function            PT Goals (current goals can now be found in the care plan section) Acute Rehab PT Goals PT Goal Formulation: With patient/family Time For Goal Achievement: 07/10/20 Potential to Achieve Goals: Good Progress towards PT goals: Progressing toward goals    Frequency    Min 5X/week      PT Plan Current plan remains appropriate  Co-evaluation              AM-PAC PT "6 Clicks" Mobility   Outcome Measure  Help needed turning from your back to your side while in a flat bed without using bedrails?: None Help needed moving from lying on your back to sitting on the side of a flat bed without using bedrails?: None Help needed moving to and from a bed to a chair (including a wheelchair)?: None Help needed standing up from a chair using your arms (e.g., wheelchair or bedside chair)?: A Little Help needed to walk in hospital room?: A Little Help needed climbing 3-5 steps with a railing? : A Little 6 Click Score: 21    End of  Session Equipment Utilized During Treatment: Back brace Activity Tolerance: Patient tolerated treatment well Patient left: in chair;with call bell/phone within reach Nurse Communication: Mobility status PT Visit Diagnosis: Other abnormalities of gait and mobility (R26.89)     Time: 0029-8473 PT Time Calculation (min) (ACUTE ONLY): 24 min  Charges:  $Gait Training: 8-22 mins $Therapeutic Activity: 8-22 mins                     Wyona Almas, PT, DPT Acute Rehabilitation Services Pager [phone] Office [phone]    Deno Etienne 06/27/2020, 9:03 AM

## 2020-06-27 NOTE — Progress Notes (Signed)
Neurosurgery Service Progress Note  Subjective: No acute events overnight, much better today, BP better, pain much better controlled  Objective: Vitals:   06/26/20 2100 06/27/20 0029 06/27/20 0510 06/27/20 0700  BP: 132/84 (!) 129/49 135/79 (!) 136/57  Pulse: 80 75  76  Resp: 16 18 20 18   Temp: 98 F (36.7 C) 97.8 F (36.6 C) 98.1 F (36.7 C) 97.8 F (36.6 C)  TempSrc: Oral Oral Oral Oral  SpO2: 99% 95% 100% 100%  Weight:      Height:        Physical Exam: Strength pain limited everywhere, but appears to be 4+/5 in BUE diffusely and 4+/5 in BLE diffusely except 4/5 in R PF, +baseline stocking distribution numbness, incision c/d/i  Assessment & Plan: 79 y.o. woman s/p 3-4/4-5 PLIF / revision.  -continue work w/ PT/OT, likely discharge in AM  [place]  06/27/20 11:23 AM

## 2020-06-28 LAB — GLUCOSE, CAPILLARY
Glucose-Capillary: 106 mg/dL — ABNORMAL HIGH (ref 70–99)
Glucose-Capillary: 160 mg/dL — ABNORMAL HIGH (ref 70–99)

## 2020-06-28 MED ORDER — HYDROCODONE-ACETAMINOPHEN 10-325 MG PO TABS: 1.0000 | ORAL_TABLET | ORAL | 0 refills | Status: DC | PRN

## 2020-06-28 MED ORDER — METHOCARBAMOL 500 MG PO TABS: 500.0000 mg | ORAL_TABLET | Freq: Four times a day (QID) | ORAL | 1 refills | Status: DC

## 2020-06-28 NOTE — Progress Notes (Signed)
Occupational Therapy Treatment Patient Details Name: Courtney [name] MRN: [id number] DOB: Jan 16, 1940 Today's Date: 06/28/2020    History of present illness Pt is an 80 y/o female s/p PLIF L4-L5 with re-exploration of L3-L4 fusion and removal of hardware. PMH including but not limited to recent L3-L4 decompression and fusion (2020), HTN and DM.   OT comments  Pt progressing toward OT goals. Currently, pt completing functional mobility and LB ADLs with min guard A for safety. Pt, daughter, and husband educated and demonstrating compensatory techniques for toilet transfers, toilet hygiene, shower transfers, brace application, oral care, and LB dressing. Pt able to recall 3/3 precautions. Pt presenting with decreased balance, strength, and functional mobility. Recommend discharge home with HHOT to optimize strength and independence in ADLs.   Follow Up Recommendations  Home health OT;Supervision/Assistance - 24 hour    Equipment Recommendations  None recommended by OT    Recommendations for Other Services      Precautions / Restrictions Precautions Precautions: Fall;Back Precaution Booklet Issued: Yes (comment) Precaution Comments: Pt able to recall 3/3 spinal precautions Required Braces or Orthoses: Spinal Brace Spinal Brace: Lumbar corset Restrictions Weight Bearing Restrictions: No       Mobility Bed Mobility Overal bed mobility: Modified Independent             General bed mobility comments: demonstrating good use of log roll technique.    Transfers Overall transfer level: Needs assistance Equipment used: Rolling walker (2 wheeled) Transfers: Sit to/from Stand Sit to Stand: Min guard         General transfer comment: standing from EOB and toilet with increased time. Min guard A for safety.    Balance Overall balance assessment: Needs assistance Sitting-balance support: No upper extremity supported;Feet supported Sitting balance-Leahy Scale: Good Sitting  balance - Comments: Pt lifting feet to don underwear with supervision for safety Postural control: Posterior lean Standing balance support: No upper extremity supported;During functional activity;Bilateral upper extremity supported Standing balance-Leahy Scale: Fair Standing balance comment: Pt able to don/doff brace standing and complete oral care with no UE support                           ADL either performed or assessed with clinical judgement   ADL Overall ADL's : Needs assistance/impaired                 Upper Body Dressing : Set up;Sitting Upper Body Dressing Details (indicate cue type and reason): donned UB dressing sitting EOB and lumbar corset with supervision. Lower Body Dressing: Sit to/from stand;Min guard Lower Body Dressing Details (indicate cue type and reason): Min guard for sit/stand when donning underwear. Pt reporting husband and daughter will don socks. Toilet Transfer: Designer, television/film set Details (indicate cue type and reason): transferred to toilet with min guard A for safety Toileting- Clothing Manipulation and Hygiene: Adhering to back precautions;Sitting/lateral lean;Supervision/safety Toileting - Clothing Manipulation Details (indicate cue type and reason): Pt completing anterior pericare with supervision sitting Tub/ Shower Transfer: Walk-in shower;Min guard;Rolling walker;Ambulation Tub/Shower Transfer Details (indicate cue type and reason): Pt educated and demonstrated compensatory techniques for walk in shower transfer. Functional mobility during ADLs: Rolling walker;Min guard General ADL Comments: Pt requiring min guard for safety during functional mobility and standing ADLs.     Vision   Vision Assessment?: No apparent visual deficits   Perception     Praxis      Cognition Arousal/Alertness: Awake/alert Behavior During Therapy: WFL for  tasks assessed/performed Overall Cognitive Status: Within Functional Limits for tasks  assessed                                 General Comments: Pt pleasant throughout.        Exercises     Shoulder Instructions       General Comments Pt, daughter, and husband educated and demonstrating compensatory techniques for oral care, LB dressing, toilet transfer, toilet hygiene, brace application, and shower transfer.    Pertinent Vitals/ Pain       Pain Assessment: Faces Faces Pain Scale: Hurts a little bit Pain Location: surgical site Pain Descriptors / Indicators: Guarding Pain Intervention(s): Monitored during session  Home Living                                          Prior Functioning/Environment              Frequency  Min 2X/week        Progress Toward Goals  OT Goals(current goals can now be found in the care plan section)  Progress towards OT goals: Progressing toward goals  Acute Rehab OT Goals Patient Stated Goal: home OT Goal Formulation: With patient Time For Goal Achievement: 07/10/20 Potential to Achieve Goals: Good ADL Goals Additional ADL Goal #1: Patient will complete ADLs with Mod I, AD/AE PRN and good adherence to spinal precautions. Additional ADL Goal #2: Patient will verbalize 3 technique to reduce risk of falls in prep for safe return home. Additional ADL Goal #3: Patient will recall and demo 3 energy conservation techniques in prep for ADLs/IADLs.  Plan Discharge plan remains appropriate    Co-evaluation                 AM-PAC OT "6 Clicks" Daily Activity     Outcome Measure   Help from another person eating meals?: None Help from another person taking care of personal grooming?: A Little Help from another person toileting, which includes using toliet, bedpan, or urinal?: A Little Help from another person bathing (including washing, rinsing, drying)?: A Little Help from another person to put on and taking off regular upper body clothing?: A Little Help from another person to put  on and taking off regular lower body clothing?: A Little 6 Click Score: 19    End of Session Equipment Utilized During Treatment: Gait belt;Rolling walker;Back brace  OT Visit Diagnosis: Unsteadiness on feet (R26.81);Muscle weakness (generalized) (M62.81)   Activity Tolerance Patient tolerated treatment well   Patient Left in chair;with call bell/phone within reach   Nurse Communication Mobility status        Time: 0947-0962 OT Time Calculation (min): 26 min  Charges: OT General Charges $OT Visit: 1 Visit OT Treatments $Self Care/Home Management : 23-37 mins  Shanda Howells, OTDS    Shanda Howells 06/28/2020, 12:58 PM

## 2020-06-28 NOTE — Discharge Summary (Signed)
Physician Discharge Summary  Patient ID: Courtney [name] MRN: [id number] DOB/AGE: [birth year] 80 y.o.  Admit date: 06/25/2020 Discharge date: 06/28/2020  Admission Diagnoses:  Discharge Diagnoses:  Active Problems:   Degenerative spondylolisthesis   Discharged Condition: good  Hospital Course: Patient admitted to the hospital where she underwent uncomplicated revision A2-6 and L4-5 decompression and fusion.  Postoperatively doing well.  Preoperative severe back and lower extremity pain much improved.  Standing ambulating and voiding without difficulty.  Patient has not had a bowel movement but does have flatus.  She has no abdominal distention.  She has some mild intermittent nausea.  She is having no wound issues.  She feels ready for discharge home.  Consults:   Significant Diagnostic Studies:   Treatments:   Discharge Exam: Blood pressure (!) 114/44, pulse 90, temperature 98.4 F (36.9 C), temperature source Oral, resp. rate 18, height 5\' 5"  (1.651 m), weight 85.4 kg, SpO2 95 %. Awake and alert.  Oriented and appropriate.  Motor and sensory function intact.  Wound clean and dry.  Chest and abdomen benign.  Disposition: Discharge disposition: 01-Home or Self Care        Allergies as of 06/28/2020       Reactions   Methotrexate Derivatives Shortness Of Breath   Breathing difficulties    Acyclovir And Related Rash   Codeine Nausea Only   Contrast Media [iodinated Diagnostic Agents] Itching, Rash   Inderide [propranolol-hctz] Rash   Iodine Rash   Lodine [etodolac] Rash   Metrizamide Itching, Rash   Procardia [nifedipine] Rash        Medication List     STOP taking these medications    HYDROcodone-acetaminophen 5-325 MG tablet Commonly known as: NORCO/VICODIN Replaced by: HYDROcodone-acetaminophen 10-325 MG tablet       TAKE these medications    acetaminophen 500 MG tablet Commonly known as: TYLENOL Take 500 mg by mouth every 6 (six) hours as needed  (for pain.).   aspirin EC 81 MG tablet Take 81 mg by mouth daily.   bisoprolol 10 MG tablet Commonly known as: ZEBETA Take 1 tablet (10 mg total) by mouth daily.   cetirizine 10 MG tablet Commonly known as: ZYRTEC Take 10 mg by mouth at bedtime.   doxazosin 4 MG tablet Commonly known as: CARDURA Take 4 mg by mouth at bedtime.   HYDROcodone-acetaminophen 10-325 MG tablet Commonly known as: NORCO Take 1 tablet by mouth every 4 (four) hours as needed for moderate pain ((score 4 to 6)). Replaces: HYDROcodone-acetaminophen 5-325 MG tablet   inFLIXimab 100 MG injection Commonly known as: REMICADE Inject 500 mg into the vein every 6 (six) weeks.   insulin NPH Human 100 UNIT/ML injection Commonly known as: NOVOLIN N Inject 0.4 mLs (40 Units total) into the skin daily before breakfast. What changed:  how much to take when to take this additional instructions   leflunomide 10 MG tablet Commonly known as: ARAVA Take 10 mg by mouth daily.   levocetirizine 5 MG tablet Commonly known as: XYZAL Take 5 mg by mouth at bedtime.   lisinopril 20 MG tablet Commonly known as: ZESTRIL Take 20 mg by mouth daily.   methocarbamol 500 MG tablet Commonly known as: Robaxin Take 1 tablet (500 mg total) by mouth 4 (four) times daily.   montelukast 10 MG tablet Commonly known as: SINGULAIR Take 10 mg by mouth daily.   pantoprazole 40 MG tablet Commonly known as: PROTONIX Take 40 mg by mouth daily.   simvastatin 40 MG tablet  Commonly known as: ZOCOR Take 40 mg by mouth at bedtime.   spironolactone 25 MG tablet Commonly known as: ALDACTONE Take 25 mg by mouth daily.               Durable Medical Equipment  (From admission, onward)           Start     Ordered   06/25/20 1948  DME Walker rolling  Once       Question:  Patient needs a walker to treat with the following condition  Answer:  Degenerative spondylolisthesis   06/25/20 1947   06/25/20 1948  DME 3 n 1  Once         06/25/20 1947             Signed: Mallie Mussel A Retta Pitcher 06/28/2020, 11:12 AM

## 2020-06-28 NOTE — Discharge Instructions (Signed)

## 2020-06-29 MED FILL — Sodium Chloride IV Soln 0.9%: INTRAVENOUS | Qty: 1000 | Status: AC

## 2020-06-29 MED FILL — Heparin Sodium (Porcine) Inj 1000 Unit/ML: INTRAMUSCULAR | Qty: 30 | Status: AC

## 2020-07-26 DIAGNOSIS — M519 Unspecified thoracic, thoracolumbar and lumbosacral intervertebral disc disorder: Secondary | ICD-10-CM | POA: Insufficient documentation

## 2021-03-31 ENCOUNTER — Other Ambulatory Visit (HOSPITAL_COMMUNITY): Payer: Self-pay | Admitting: Neurosurgery

## 2021-03-31 ENCOUNTER — Other Ambulatory Visit: Payer: Self-pay | Admitting: Neurosurgery

## 2021-03-31 DIAGNOSIS — M5416 Radiculopathy, lumbar region: Secondary | ICD-10-CM

## 2021-04-05 ENCOUNTER — Other Ambulatory Visit: Payer: Self-pay

## 2021-04-05 ENCOUNTER — Ambulatory Visit
Admission: RE | Admit: 2021-04-05 | Discharge: 2021-04-05 | Disposition: A | Discharge status: Home / Self Care | Payer: Medicare Other | Source: Ambulatory Visit | Attending: Neurosurgery | Admitting: Neurosurgery

## 2021-04-05 DIAGNOSIS — M5416 Radiculopathy, lumbar region: Secondary | ICD-10-CM | POA: Diagnosis not present

## 2021-05-12 ENCOUNTER — Other Ambulatory Visit: Payer: Self-pay | Admitting: Neurosurgery

## 2021-05-13 NOTE — Pre-Procedure Instructions (Signed)
Surgical Instructions ? ? ? Your procedure is scheduled on Tuesday 05/17/21. ? ? Report to [facility] Main Entrance "A" at 07:50 A.M., then check in with the Admitting office. ? Call this number if you have problems the morning of surgery: ? [phone] ? ? If you have any questions prior to your surgery date call [phone]: Open Monday-Friday 8am-4pm ? ? ? Remember: ? Do not eat or drink after midnight the night before your surgery ?  ? Take these medicines the morning of surgery with A SIP OF WATER:  ? bisoprolol (ZEBETA)  ? montelukast (SINGULAIR)  ? pantoprazole (PROTONIX)  ? ? Take these medicines if needed:  ? acetaminophen (TYLENOL)  ? tiZANidine (ZANAFLEX) ? ?Please follow your surgeon's instructions regarding leflunomide (ARAVA) and inFLIXimab (REMICADE). If you have not received instructions then please contact your surgeon's office for instructions.  ? ?As of today, STOP taking any Aspirin (unless otherwise instructed by your surgeon) Aleve, Naproxen, Ibuprofen, Motrin, Advil, Goody's, BC's, all herbal medications, fish oil, and all vitamins. ? ?WHAT DO I DO ABOUT MY DIABETES MEDICATION? ? ? ?Do not take oral diabetes medicines (pills) the morning of surgery. ? ?THE NIGHT BEFORE SURGERY, take 20 units of insulin NPH Human (HUMULIN N,NOVOLIN N). This is half of your normal dose.      ? ? ?THE MORNING OF SURGERY, take 20 units of insulin NPH Human (HUMULIN N,NOVOLIN N). This is half of your normal dose.  ? ?The day of surgery, do not take other diabetes injectables, including Byetta (exenatide), Bydureon (exenatide ER), Victoza (liraglutide), or Trulicity (dulaglutide). ? ?If your CBG is greater than 220 mg/dL, you may take ? of your sliding scale (correction) dose of insulin. ? ? ?HOW TO MANAGE YOUR DIABETES ?BEFORE AND AFTER SURGERY ? ?Why is it important to control my blood sugar before and after surgery? ?Improving blood sugar levels before and after surgery helps healing and can limit problems. ?A  way of improving blood sugar control is eating a healthy diet by: ? Eating less sugar and carbohydrates ? Increasing activity/exercise ? Talking with your doctor about reaching your blood sugar goals ?High blood sugars (greater than 180 mg/dL) can raise your risk of infections and slow your recovery, so you will need to focus on controlling your diabetes during the weeks before surgery. ?Make sure that the doctor who takes care of your diabetes knows about your planned surgery including the date and location. ? ?How do I manage my blood sugar before surgery? ?Check your blood sugar at least 4 times a day, starting 2 days before surgery, to make sure that the level is not too high or low. ? ?Check your blood sugar the morning of your surgery when you wake up and every 2 hours until you get to the Short Stay unit. ? ?If your blood sugar is less than 70 mg/dL, you will need to treat for low blood sugar: ?Do not take insulin. ?Treat a low blood sugar (less than 70 mg/dL) with ? cup of clear juice (cranberry or apple), 4 glucose tablets, OR glucose gel. ?Recheck blood sugar in 15 minutes after treatment (to make sure it is greater than 70 mg/dL). If your blood sugar is not greater than 70 mg/dL on recheck, call [phone] for further instructions. ?Report your blood sugar to the short stay nurse when you get to Short Stay. ? ?If you are admitted to the hospital after surgery: ?Your blood sugar will be checked by the staff and you  will probably be given insulin after surgery (instead of oral diabetes medicines) to make sure you have good blood sugar levels. ?The goal for blood sugar control after surgery is 80-180 mg/dL.  ?         ?Do not wear jewelry or makeup ?Do not wear lotions, powders, perfumes/colognes, or deodorant. ?Do not shave 48 hours prior to surgery.  Men may shave face and neck. ?Do not bring valuables to the hospital. ?Do not wear nail polish, gel polish, artificial nails, or any other type of covering  on natural nails (fingers and toes) ?If you have artificial nails or gel coating that need to be removed by a nail salon, please have this removed prior to surgery. Artificial nails or gel coating may interfere with anesthesia's ability to adequately monitor your vital signs. ? ?[facility] is not responsible for any belongings or valuables. .  ? ?Do NOT Smoke (Tobacco/Vaping)  24 hours prior to your procedure ? ?If you use a CPAP at night, you may bring your mask for your overnight stay. ?  ?Contacts, glasses, hearing aids, dentures or partials may not be worn into surgery, please bring cases for these belongings ?  ?For patients admitted to the hospital, discharge time will be determined by your treatment team. ?  ?Patients discharged the day of surgery will not be allowed to drive home, and someone needs to stay with them for 24 hours. ? ? ?SURGICAL WAITING ROOM VISITATION ?Patients having surgery or a procedure in a hospital may have two support people. ?Children under the age of 33 must have an adult with them who is not the patient. ?They may stay in the waiting area during the procedure and may switch out with other visitors. If the patient needs to stay at the hospital during part of their recovery, the visitor guidelines for inpatient rooms apply. ? ?Please refer to the [facility] website for the visitor guidelines for Inpatients (after your surgery is over and you are in a regular room).  ? ? ? ? ? ?Special instructions:   ? ?Oral Hygiene is also important to reduce your risk of infection.  Remember - BRUSH YOUR TEETH THE MORNING OF SURGERY WITH YOUR REGULAR TOOTHPASTE ? ? ?[facility]- Preparing For Surgery ? ?Before surgery, you can play an important role. Because skin is not sterile, your skin needs to be as free of germs as possible. You can reduce the number of germs on your skin by washing with CHG (chlorahexidine gluconate) Soap before surgery.  CHG is an antiseptic cleaner which kills germs and  bonds with the skin to continue killing germs even after washing.   ? ? ?Please do not use if you have an allergy to CHG or antibacterial soaps. If your skin becomes reddened/irritated stop using the CHG.  ?Do not shave (including legs and underarms) for at least 48 hours prior to first CHG shower. It is OK to shave your face. ? ?Please follow these instructions carefully. ?  ? ? Shower the NIGHT BEFORE SURGERY and the MORNING OF SURGERY with CHG Soap.  ? If you chose to wash your hair, wash your hair first as usual with your normal shampoo. After you shampoo, rinse your hair and body thoroughly to remove the shampoo.  Then [employer] and genitals (private parts) with your normal soap and rinse thoroughly to remove soap. ? ?After that Use CHG Soap as you would any other liquid soap. You can apply CHG directly to the skin  and wash gently with a scrungie or a clean washcloth.  ? ?Apply the CHG Soap to your body ONLY FROM THE NECK DOWN.  Do not use on open wounds or open sores. Avoid contact with your eyes, ears, mouth and genitals (private parts). Wash Face and genitals (private parts)  with your normal soap.  ? ?Wash thoroughly, paying special attention to the area where your surgery will be performed. ? ?Thoroughly rinse your body with warm water from the neck down. ? ?DO NOT shower/wash with your normal soap after using and rinsing off the CHG Soap. ? ?Pat yourself dry with a CLEAN TOWEL. ? ?Wear CLEAN PAJAMAS to bed the night before surgery ? ?Place CLEAN SHEETS on your bed the night before your surgery ? ?DO NOT SLEEP WITH PETS. ? ? ?Day of Surgery: ? ?Take a shower with CHG soap. ?Wear Clean/Comfortable clothing the morning of surgery ?Do not apply any deodorants/lotions.   ?Remember to brush your teeth WITH YOUR REGULAR TOOTHPASTE. ? ? ? ?If you received a COVID test during your pre-op visit, it is requested that you wear a mask when out in public, stay away from anyone that may not be feeling well, and notify  your surgeon if you develop symptoms. If you have been in contact with anyone that has tested positive in the last 10 days, please notify your surgeon. ? ?  ?Please read over the following fact sheets that

## 2021-05-16 ENCOUNTER — Other Ambulatory Visit: Payer: Self-pay

## 2021-05-16 ENCOUNTER — Encounter (HOSPITAL_COMMUNITY)
Admission: RE | Admit: 2021-05-16 | Discharge: 2021-05-16 | Disposition: A | Discharge status: Home / Self Care | Payer: Medicare Other | Source: Ambulatory Visit | Attending: Neurosurgery | Admitting: Neurosurgery

## 2021-05-16 ENCOUNTER — Encounter (HOSPITAL_COMMUNITY): Payer: Self-pay

## 2021-05-16 VITALS — BP 168/68 | HR 76 | Temp 97.8°F | Resp 18 | Ht 68.0 in | Wt 193.6 lb

## 2021-05-16 DIAGNOSIS — Z20822 Contact with and (suspected) exposure to covid-19: Secondary | ICD-10-CM | POA: Insufficient documentation

## 2021-05-16 DIAGNOSIS — Z01818 Encounter for other preprocedural examination: Secondary | ICD-10-CM

## 2021-05-16 DIAGNOSIS — Z01812 Encounter for preprocedural laboratory examination: Secondary | ICD-10-CM | POA: Insufficient documentation

## 2021-05-16 DIAGNOSIS — E119 Type 2 diabetes mellitus without complications: Secondary | ICD-10-CM | POA: Insufficient documentation

## 2021-05-16 LAB — BASIC METABOLIC PANEL
Anion gap: 6 (ref 5–15)
BUN: 20 mg/dL (ref 8–23)
CO2: 22 mmol/L (ref 22–32)
Calcium: 9.5 mg/dL (ref 8.9–10.3)
Chloride: 110 mmol/L (ref 98–111)
Creatinine, Ser: 0.8 mg/dL (ref 0.44–1.00)
GFR, Estimated: >60 mL/min (ref >60)
Glucose, Bld: 174 mg/dL — ABNORMAL HIGH (ref 70–99)
Potassium: 4.6 mmol/L (ref 3.5–5.1)
Sodium: 138 mmol/L (ref 135–145)

## 2021-05-16 LAB — SURGICAL PCR SCREEN
MRSA, PCR: NEGATIVE
Staphylococcus aureus: POSITIVE — AB

## 2021-05-16 LAB — TYPE AND SCREEN
ABO/RH(D): A POS
Antibody Screen: NEGATIVE

## 2021-05-16 LAB — GLUCOSE, CAPILLARY: Glucose-Capillary: 192 mg/dL — ABNORMAL HIGH (ref 70–99)

## 2021-05-16 NOTE — Progress Notes (Addendum)
PCP - Dr. Emily Filbert ?Cardiologist - denies ? ?PPM/ICD - n/a ?Device Orders - n/a ?Rep Notified - n/a ? ?Chest x-ray - n/a ?EKG - 06/25/20 ?Stress Test - denies ?ECHO - 06/24/20 ?Cardiac Cath - denies ? ?Sleep Study - +OSA. Does not wear CPA ? ?CBG today- 192 ?Checks Blood Sugar three times a day ? ?Blood Thinner Instructions: ?Aspirin Instructions: As of today stop taking Aspirin unless otherwise instructed by your surgeon. Patient states she took last dose of Aspirin 05/11/21. ? ?ERAS Protcol - No. NPO ? ? ?COVID TEST- n/a ? ? ?Anesthesia review: Yes. Message sent to anesthesia PA's.  ? ?Patient denies shortness of breath, fever, cough and chest pain at PAT appointment ? ? ?All instructions explained to the patient, with a verbal understanding of the material. Patient agrees to go over the instructions while at home for a better understanding. Patient also instructed to self quarantine after being tested for COVID-19. The opportunity to ask questions was provided. ? ? ?

## 2021-05-16 NOTE — Progress Notes (Signed)
Patient voiced understanding of new arrival time of 0600 tomorrow  ?

## 2021-05-16 NOTE — Progress Notes (Signed)
Anesthesia Chart Review: ? ?History of mild-moderate aortic stenosis followed with annual echo by PCP Dr. Emily Filbert at [place].  Last echo 11/16/20 showed normal LVEF, moderate AS with mean gradient 19.2 mmHg - unchanged from prior. She also had a stress echo in 2018 that was normal (mean gradient at that time was 13 mmHg). ?  ?History of rheumatoid arthritis followed by rheumatology [facility] clinic, she is maintained on Remicade.  She was previously on methotrexate, however this was stopped due to the development of pulmonary fibrosis.  Her pulmonary status is reportedly stable per notes in Care Everywhere. ?  ?History of OSA, patient reports she does not use CPAP. ?  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 ?  ?CBC in Care Everywhere 04/25/2021 reviewed, unremarkable. ? ?CMP 05/16/2021 reviewed, unremarkable. ? 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  Rate 72. Anterior infarct , age undetermined. No significant change since 08/13/2017 ?  ?TTE 11/16/2020 (care everywhere): ?INTERPRETATION  ?NORMAL LEFT VENTRICULAR SYSTOLIC FUNCTION  ?NORMAL RIGHT VENTRICULAR SYSTOLIC FUNCTION  ?MILD VALVULAR REGURGITATION (See above)  ?MODERATE VALVULAR STENOSIS (See above)  ?AS unchanged from 2021 study  ? ?Stress echo 01/13/2016 (Care Everywhere): ?INTERPRETATION  ?Normal Stress Echocardiogram  ?NORMAL RIGHT VENTRICULAR SYSTOLIC FUNCTION  ?MILD VALVULAR REGURGITATION (See above)  ?MILD VALVULAR STENOSIS (See above)  ?Mild calcific AS, peak transaortic velocity 2.5 m/s  ?  ? ? ?Karoline Caldwell, PA-C ?[facility] Short Stay Center/Anesthesiology ?Phone [phone] ?05/16/2021 4:08 PM ? ?

## 2021-05-16 NOTE — Anesthesia Preprocedure Evaluation (Addendum)
Anesthesia Evaluation  ?Patient identified by MRN, date of birth, ID band ?Patient awake ? ? ? ?Reviewed: ?Allergy & Precautions, NPO status , Patient's Chart, lab work & pertinent test results ? ?History of Anesthesia Complications ?(+) PONV and history of anesthetic complications ? ?Airway ?Mallampati: II ? ?TM Distance: >3 FB ? ? ? ? Dental ?  ?Pulmonary ?sleep apnea , pneumonia,  ?  ?breath sounds clear to auscultation ? ? ? ? ? ? Cardiovascular ?hypertension,  ?Rhythm:Regular Rate:Normal ? ? ?  ?Neuro/Psych ? Headaches,  Neuromuscular disease   ? GI/Hepatic ?Neg liver ROS, hiatal hernia, GERD  ,  ?Endo/Other  ?diabetes ? Renal/GU ?Renal disease  ? ?  ?Musculoskeletal ? ? Abdominal ?  ?Peds ? Hematology ?  ?Anesthesia Other Findings ? ? Reproductive/Obstetrics ? ?  ? ? ? ? ? ? ? ? ? ? ? ? ? ?  ?  ? ? ? ? ? ? ?Anesthesia Physical ?Anesthesia Plan ? ?ASA: 3 ? ?Anesthesia Plan: General  ? ?Post-op Pain Management:   ? ?Induction: Intravenous ? ?PONV Risk Score and Plan: 4 or greater and Dexamethasone, Ondansetron and Midazolam ? ?Airway Management Planned: Oral ETT ? ?Additional Equipment:  ? ?Intra-op Plan:  ? ?Post-operative Plan: Possible Post-op intubation/ventilation ? ?Informed Consent: I have reviewed the patients History and Physical, chart, labs and discussed the procedure including the risks, benefits and alternatives for the proposed anesthesia with the patient or authorized representative who has indicated his/her understanding and acceptance.  ? ? ? ?Dental advisory given ? ?Plan Discussed with: CRNA and Anesthesiologist ? ?Anesthesia Plan Comments: (PAT note by Karoline Caldwell, PA-C: ?History of mild-moderate aortic stenosis followed with annual echo by PCP Dr. Emily Filbert at [place]. ?Last echo 11/16/20 showed normal LVEF, moderate AS with mean gradient 19.2 mmHg - unchanged from prior.?She also had a stress echo in 2018 that was normal (mean gradient at that time was 13  mmHg). ?? ?History of rheumatoid arthritis followed by rheumatology [facility] clinic, she is maintained on Remicade. ?She was previously on methotrexate, however this was stopped due to the development of pulmonary fibrosis. ?Her pulmonary status is reportedly stable per notes in Care Everywhere. ?? ?History of OSA, patient reports she does not use CPAP. ?? 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 ?? ?CBC in Care Everywhere 04/25/2021 reviewed, unremarkable. ? ?CMP 05/16/2021 reviewed, unremarkable. ? 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  Rate 72. Anterior infarct , age undetermined. No significant change since 08/13/2017 ?? ?TTE 11/16/2020 (care everywhere): ?INTERPRETATION  ?NORMAL LEFT VENTRICULAR SYSTOLIC FUNCTION  ?NORMAL RIGHT VENTRICULAR SYSTOLIC FUNCTION  ?MILD VALVULAR REGURGITATION (See above)  ?MODERATE VALVULAR STENOSIS (See above)  ?AS unchanged from 2021 study  ? ?Stress echo 01/13/2016 (Care Everywhere): ?INTERPRETATION  ?Normal Stress Echocardiogram  ?NORMAL RIGHT VENTRICULAR SYSTOLIC FUNCTION  ?MILD VALVULAR REGURGITATION (See above)  ?MILD VALVULAR STENOSIS (See above)  ?Mild calcific AS, peak transaortic velocity 2.5 m/s  ?? ? ?)  ? ? ? ? ? ?Anesthesia Quick Evaluation ? ?

## 2021-05-17 ENCOUNTER — Other Ambulatory Visit: Payer: Self-pay

## 2021-05-17 ENCOUNTER — Ambulatory Visit (HOSPITAL_BASED_OUTPATIENT_CLINIC_OR_DEPARTMENT_OTHER): Payer: Medicare Other | Admitting: Physician Assistant

## 2021-05-17 ENCOUNTER — Ambulatory Visit (HOSPITAL_COMMUNITY): Payer: Medicare Other | Admitting: Physician Assistant

## 2021-05-17 ENCOUNTER — Encounter (HOSPITAL_COMMUNITY): Payer: Self-pay | Admitting: Neurosurgery

## 2021-05-17 ENCOUNTER — Ambulatory Visit (HOSPITAL_COMMUNITY): Payer: Medicare Other

## 2021-05-17 ENCOUNTER — Encounter (HOSPITAL_COMMUNITY)
Admission: RE | Disposition: A | Discharge status: Home / Self Care | Payer: Self-pay | Source: Home / Self Care | Attending: Neurosurgery

## 2021-05-17 ENCOUNTER — Observation Stay (HOSPITAL_COMMUNITY)
Admission: RE | Admit: 2021-05-17 | Discharge: 2021-05-18 | Disposition: A | Discharge status: Home / Self Care | Payer: Medicare Other | Attending: Neurosurgery | Admitting: Neurosurgery

## 2021-05-17 DIAGNOSIS — M5417 Radiculopathy, lumbosacral region: Secondary | ICD-10-CM

## 2021-05-17 DIAGNOSIS — Z79899 Other long term (current) drug therapy: Secondary | ICD-10-CM | POA: Insufficient documentation

## 2021-05-17 DIAGNOSIS — Z85828 Personal history of other malignant neoplasm of skin: Secondary | ICD-10-CM | POA: Insufficient documentation

## 2021-05-17 DIAGNOSIS — M4317 Spondylolisthesis, lumbosacral region: Secondary | ICD-10-CM

## 2021-05-17 DIAGNOSIS — Z7982 Long term (current) use of aspirin: Secondary | ICD-10-CM | POA: Diagnosis not present

## 2021-05-17 DIAGNOSIS — Z981 Arthrodesis status: Secondary | ICD-10-CM | POA: Diagnosis not present

## 2021-05-17 DIAGNOSIS — M9983 Other biomechanical lesions of lumbar region: Secondary | ICD-10-CM | POA: Diagnosis not present

## 2021-05-17 DIAGNOSIS — Z794 Long term (current) use of insulin: Secondary | ICD-10-CM | POA: Insufficient documentation

## 2021-05-17 DIAGNOSIS — E119 Type 2 diabetes mellitus without complications: Secondary | ICD-10-CM | POA: Diagnosis not present

## 2021-05-17 DIAGNOSIS — I10 Essential (primary) hypertension: Secondary | ICD-10-CM | POA: Insufficient documentation

## 2021-05-17 DIAGNOSIS — M4807 Spinal stenosis, lumbosacral region: Secondary | ICD-10-CM | POA: Diagnosis present

## 2021-05-17 DIAGNOSIS — M5416 Radiculopathy, lumbar region: Secondary | ICD-10-CM | POA: Diagnosis not present

## 2021-05-17 LAB — GLUCOSE, CAPILLARY
Glucose-Capillary: 138 mg/dL — ABNORMAL HIGH (ref 70–99)
Glucose-Capillary: 143 mg/dL — ABNORMAL HIGH (ref 70–99)
Glucose-Capillary: 153 mg/dL — ABNORMAL HIGH (ref 70–99)
Glucose-Capillary: 432 mg/dL — ABNORMAL HIGH (ref 70–99)
Glucose-Capillary: 328 mg/dL — ABNORMAL HIGH (ref 70–99)

## 2021-05-17 LAB — HEMOGLOBIN A1C
Hgb A1c MFr Bld: 6.2 % — ABNORMAL HIGH (ref 4.8–5.6)
Mean Plasma Glucose: 131.24 mg/dL

## 2021-05-17 SURGERY — POSTERIOR LUMBAR FUSION 1 LEVEL
Anesthesia: General | Site: Back

## 2021-05-17 MED ORDER — SODIUM CHLORIDE 0.9 % IV SOLN
250.0000 mL | INTRAVENOUS | Status: DC
  Administered 2021-05-17: 250 mL via INTRAVENOUS

## 2021-05-17 MED ORDER — CHLORHEXIDINE GLUCONATE CLOTH 2 % EX PADS: 6.0000 | MEDICATED_PAD | Freq: Once | CUTANEOUS | Status: DC

## 2021-05-17 MED ORDER — LIDOCAINE 2% (20 MG/ML) 5 ML SYRINGE
INTRAMUSCULAR | Status: AC
  Filled 2021-05-17: qty 5

## 2021-05-17 MED ORDER — CEFAZOLIN SODIUM-DEXTROSE 2-4 GM/100ML-% IV SOLN
2.0000 g | INTRAVENOUS | Status: AC
Start: 2021-05-17 — End: 2021-05-17
  Administered 2021-05-17: 2 g via INTRAVENOUS

## 2021-05-17 MED ORDER — CEFAZOLIN SODIUM-DEXTROSE 1-4 GM/50ML-% IV SOLN
1.0000 g | Freq: Three times a day (TID) | INTRAVENOUS | Status: AC
  Administered 2021-05-17 (×2): 1 g via INTRAVENOUS
  Filled 2021-05-17 (×2): qty 50

## 2021-05-17 MED ORDER — ONDANSETRON HCL 4 MG PO TABS
4.0000 mg | ORAL_TABLET | Freq: Four times a day (QID) | ORAL | Status: DC | PRN
  Administered 2021-05-17 – 2021-05-18 (×2): 4 mg via ORAL
  Filled 2021-05-17 (×2): qty 1

## 2021-05-17 MED ORDER — CEFAZOLIN SODIUM-DEXTROSE 2-4 GM/100ML-% IV SOLN
INTRAVENOUS | Status: AC
Start: 2021-05-17 — End: 2021-05-17
  Filled 2021-05-17: qty 100

## 2021-05-17 MED ORDER — SIMVASTATIN 20 MG PO TABS
40.0000 mg | ORAL_TABLET | Freq: Every day | ORAL | Status: DC
  Administered 2021-05-17: 40 mg via ORAL
  Filled 2021-05-17: qty 2

## 2021-05-17 MED ORDER — CHLORHEXIDINE GLUCONATE CLOTH 2 % EX PADS
6.0000 | MEDICATED_PAD | Freq: Once | CUTANEOUS | Status: DC
Start: 2021-05-17 — End: 2021-05-17

## 2021-05-17 MED ORDER — SPIRONOLACTONE 25 MG PO TABS
25.0000 mg | ORAL_TABLET | Freq: Every day | ORAL | Status: DC
  Administered 2021-05-17 – 2021-05-18 (×2): 25 mg via ORAL
  Filled 2021-05-17 (×2): qty 1

## 2021-05-17 MED ORDER — DEXAMETHASONE SODIUM PHOSPHATE 10 MG/ML IJ SOLN
INTRAMUSCULAR | Status: AC
  Filled 2021-05-17: qty 1

## 2021-05-17 MED ORDER — FENTANYL CITRATE (PF) 100 MCG/2ML IJ SOLN
INTRAMUSCULAR | Status: AC
  Filled 2021-05-17: qty 2

## 2021-05-17 MED ORDER — MENTHOL 3 MG MT LOZG: 1.0000 | LOZENGE | OROMUCOSAL | Status: DC | PRN

## 2021-05-17 MED ORDER — PHENOL 1.4 % MT LIQD: 1.0000 | OROMUCOSAL | Status: DC | PRN

## 2021-05-17 MED ORDER — FENTANYL CITRATE (PF) 100 MCG/2ML IJ SOLN: 25.0000 ug | INTRAMUSCULAR | Status: DC | PRN

## 2021-05-17 MED ORDER — ORAL CARE MOUTH RINSE: 15.0000 mL | Freq: Once | OROMUCOSAL | Status: AC

## 2021-05-17 MED ORDER — PHENYLEPHRINE 80 MCG/ML (10ML) SYRINGE FOR IV PUSH (FOR BLOOD PRESSURE SUPPORT)
PREFILLED_SYRINGE | INTRAVENOUS | Status: DC | PRN
  Administered 2021-05-17: 160 ug via INTRAVENOUS
  Administered 2021-05-17: 80 ug via INTRAVENOUS

## 2021-05-17 MED ORDER — DEXAMETHASONE SODIUM PHOSPHATE 10 MG/ML IJ SOLN
INTRAMUSCULAR | Status: DC | PRN
  Administered 2021-05-17: 5 mg via INTRAVENOUS

## 2021-05-17 MED ORDER — ESMOLOL HCL 100 MG/10ML IV SOLN
INTRAVENOUS | Status: DC | PRN
  Administered 2021-05-17: 30 mg via INTRAVENOUS
  Administered 2021-05-17: 20 mg via INTRAVENOUS
  Administered 2021-05-17: 30 mg via INTRAVENOUS
  Administered 2021-05-17: 20 mg via INTRAVENOUS

## 2021-05-17 MED ORDER — PANTOPRAZOLE SODIUM 40 MG PO TBEC
40.0000 mg | DELAYED_RELEASE_TABLET | Freq: Every day | ORAL | Status: DC
  Administered 2021-05-18: 40 mg via ORAL
  Filled 2021-05-17: qty 1

## 2021-05-17 MED ORDER — FENTANYL CITRATE (PF) 250 MCG/5ML IJ SOLN
INTRAMUSCULAR | Status: AC
  Filled 2021-05-17: qty 5

## 2021-05-17 MED ORDER — POLYETHYLENE GLYCOL 3350 17 G PO PACK: 17.0000 g | PACK | Freq: Every day | ORAL | Status: DC | PRN

## 2021-05-17 MED ORDER — 0.9 % SODIUM CHLORIDE (POUR BTL) OPTIME
TOPICAL | Status: DC | PRN
  Administered 2021-05-17: 1000 mL

## 2021-05-17 MED ORDER — ROCURONIUM BROMIDE 10 MG/ML (PF) SYRINGE
PREFILLED_SYRINGE | INTRAVENOUS | Status: DC | PRN
Start: 2021-05-17 — End: 2021-05-17
  Administered 2021-05-17: 20 mg via INTRAVENOUS
  Administered 2021-05-17: 60 mg via INTRAVENOUS
  Administered 2021-05-17: 10 mg via INTRAVENOUS

## 2021-05-17 MED ORDER — FENTANYL CITRATE (PF) 100 MCG/2ML IJ SOLN
25.0000 ug | INTRAMUSCULAR | Status: DC | PRN
  Administered 2021-05-17 (×2): 50 ug via INTRAVENOUS

## 2021-05-17 MED ORDER — LISINOPRIL 20 MG PO TABS
20.0000 mg | ORAL_TABLET | Freq: Every day | ORAL | Status: DC
  Administered 2021-05-17 – 2021-05-18 (×2): 20 mg via ORAL
  Filled 2021-05-17 (×2): qty 1

## 2021-05-17 MED ORDER — EPHEDRINE 5 MG/ML INJ
INTRAVENOUS | Status: AC
  Filled 2021-05-17: qty 5

## 2021-05-17 MED ORDER — CHLORHEXIDINE GLUCONATE 0.12 % MT SOLN
15.0000 mL | Freq: Once | OROMUCOSAL | Status: AC
  Administered 2021-05-17: 15 mL via OROMUCOSAL

## 2021-05-17 MED ORDER — PHENYLEPHRINE 80 MCG/ML (10ML) SYRINGE FOR IV PUSH (FOR BLOOD PRESSURE SUPPORT)
PREFILLED_SYRINGE | INTRAVENOUS | Status: AC
  Filled 2021-05-17: qty 20

## 2021-05-17 MED ORDER — EPHEDRINE SULFATE-NACL 50-0.9 MG/10ML-% IV SOSY
PREFILLED_SYRINGE | INTRAVENOUS | Status: DC | PRN
  Administered 2021-05-17: 5 mg via INTRAVENOUS

## 2021-05-17 MED ORDER — LORATADINE 10 MG PO TABS
10.0000 mg | ORAL_TABLET | Freq: Every day | ORAL | Status: DC
Start: 2021-05-18 — End: 2021-05-18
  Administered 2021-05-18: 10 mg via ORAL
  Filled 2021-05-17: qty 1

## 2021-05-17 MED ORDER — INSULIN NPH (HUMAN) (ISOPHANE) 100 UNIT/ML ~~LOC~~ SUSP
40.0000 [IU] | Freq: Two times a day (BID) | SUBCUTANEOUS | Status: DC
  Administered 2021-05-17 – 2021-05-18 (×2): 40 [IU] via SUBCUTANEOUS
  Filled 2021-05-17: qty 10

## 2021-05-17 MED ORDER — ASCORBIC ACID 500 MG PO TABS
1000.0000 mg | ORAL_TABLET | Freq: Every day | ORAL | Status: DC
  Administered 2021-05-18: 1000 mg via ORAL
  Filled 2021-05-17: qty 2

## 2021-05-17 MED ORDER — TIZANIDINE HCL 4 MG PO TABS
4.0000 mg | ORAL_TABLET | Freq: Three times a day (TID) | ORAL | Status: DC | PRN
  Administered 2021-05-17 (×2): 4 mg via ORAL
  Filled 2021-05-17 (×2): qty 1

## 2021-05-17 MED ORDER — CEFAZOLIN SODIUM-DEXTROSE 1-4 GM/50ML-% IV SOLN: 1.0000 g | Freq: Three times a day (TID) | INTRAVENOUS | Status: DC

## 2021-05-17 MED ORDER — HYDROMORPHONE HCL 1 MG/ML IJ SOLN: 1.0000 mg | INTRAMUSCULAR | Status: DC | PRN

## 2021-05-17 MED ORDER — AMISULPRIDE (ANTIEMETIC) 5 MG/2ML IV SOLN
10.0000 mg | Freq: Once | INTRAVENOUS | Status: AC
  Administered 2021-05-17: 10 mg via INTRAVENOUS

## 2021-05-17 MED ORDER — PHENYLEPHRINE HCL-NACL 20-0.9 MG/250ML-% IV SOLN
INTRAVENOUS | Status: DC | PRN
  Administered 2021-05-17: 40 ug/min via INTRAVENOUS

## 2021-05-17 MED ORDER — ONDANSETRON HCL 4 MG/2ML IJ SOLN
INTRAMUSCULAR | Status: DC | PRN
  Administered 2021-05-17 (×2): 4 mg via INTRAVENOUS

## 2021-05-17 MED ORDER — PROPOFOL 10 MG/ML IV BOLUS
INTRAVENOUS | Status: AC
  Filled 2021-05-17: qty 20

## 2021-05-17 MED ORDER — PROPOFOL 10 MG/ML IV BOLUS
INTRAVENOUS | Status: DC | PRN
Start: 2021-05-17 — End: 2021-05-17
  Administered 2021-05-17: 120 mg via INTRAVENOUS

## 2021-05-17 MED ORDER — ACETAMINOPHEN 650 MG RE SUPP: 650.0000 mg | RECTAL | Status: DC | PRN

## 2021-05-17 MED ORDER — ROCURONIUM BROMIDE 10 MG/ML (PF) SYRINGE
PREFILLED_SYRINGE | INTRAVENOUS | Status: AC
  Filled 2021-05-17: qty 20

## 2021-05-17 MED ORDER — THROMBIN 20000 UNITS EX SOLR
CUTANEOUS | Status: DC | PRN
  Administered 2021-05-17: 20 mL

## 2021-05-17 MED ORDER — BISACODYL 10 MG RE SUPP: 10.0000 mg | Freq: Every day | RECTAL | Status: DC | PRN

## 2021-05-17 MED ORDER — LIDOCAINE 2% (20 MG/ML) 5 ML SYRINGE
INTRAMUSCULAR | Status: DC | PRN
  Administered 2021-05-17: 40 mg via INTRAVENOUS

## 2021-05-17 MED ORDER — SODIUM CHLORIDE 0.9% FLUSH
3.0000 mL | Freq: Two times a day (BID) | INTRAVENOUS | Status: DC
  Administered 2021-05-17 – 2021-05-18 (×3): 3 mL via INTRAVENOUS

## 2021-05-17 MED ORDER — LACTATED RINGERS IV SOLN
INTRAVENOUS | Status: DC
Start: 2021-05-17 — End: 2021-05-17

## 2021-05-17 MED ORDER — BUPIVACAINE HCL (PF) 0.25 % IJ SOLN
INTRAMUSCULAR | Status: DC | PRN
Start: 2021-05-17 — End: 2021-05-17
  Administered 2021-05-17: 20 mL

## 2021-05-17 MED ORDER — INSULIN ASPART 100 UNIT/ML IJ SOLN: 0.0000 [IU] | INTRAMUSCULAR | Status: DC | PRN

## 2021-05-17 MED ORDER — VANCOMYCIN HCL 1000 MG IV SOLR
INTRAVENOUS | Status: DC | PRN
  Administered 2021-05-17: 1 g via TOPICAL

## 2021-05-17 MED ORDER — BUPIVACAINE HCL (PF) 0.25 % IJ SOLN
INTRAMUSCULAR | Status: AC
  Filled 2021-05-17: qty 30

## 2021-05-17 MED ORDER — FLEET ENEMA 7-19 GM/118ML RE ENEM: 1.0000 | ENEMA | Freq: Once | RECTAL | Status: DC | PRN

## 2021-05-17 MED ORDER — SUCCINYLCHOLINE CHLORIDE 200 MG/10ML IV SOSY
PREFILLED_SYRINGE | INTRAVENOUS | Status: AC
  Filled 2021-05-17: qty 10

## 2021-05-17 MED ORDER — ASPIRIN EC 81 MG PO TBEC
81.0000 mg | DELAYED_RELEASE_TABLET | Freq: Every day | ORAL | Status: DC
  Administered 2021-05-18: 81 mg via ORAL
  Filled 2021-05-17: qty 1

## 2021-05-17 MED ORDER — THROMBIN 20000 UNITS EX SOLR
CUTANEOUS | Status: AC
  Filled 2021-05-17: qty 20000

## 2021-05-17 MED ORDER — AMISULPRIDE (ANTIEMETIC) 5 MG/2ML IV SOLN
INTRAVENOUS | Status: AC
Start: 2021-05-17 — End: 2021-05-17
  Filled 2021-05-17: qty 4

## 2021-05-17 MED ORDER — ONDANSETRON HCL 4 MG/2ML IJ SOLN
INTRAMUSCULAR | Status: AC
  Filled 2021-05-17: qty 2

## 2021-05-17 MED ORDER — VANCOMYCIN HCL 1000 MG IV SOLR
INTRAVENOUS | Status: AC
  Filled 2021-05-17: qty 20

## 2021-05-17 MED ORDER — HYDROCODONE-ACETAMINOPHEN 10-325 MG PO TABS
1.0000 | ORAL_TABLET | ORAL | Status: DC | PRN
  Administered 2021-05-17 – 2021-05-18 (×4): 1 via ORAL
  Filled 2021-05-17 (×4): qty 1

## 2021-05-17 MED ORDER — INSULIN ASPART 100 UNIT/ML IJ SOLN
0.0000 [IU] | Freq: Three times a day (TID) | INTRAMUSCULAR | Status: DC
  Administered 2021-05-17: 15 [IU] via SUBCUTANEOUS
  Administered 2021-05-18: 8 [IU] via SUBCUTANEOUS

## 2021-05-17 MED ORDER — SUCCINYLCHOLINE CHLORIDE 200 MG/10ML IV SOSY
PREFILLED_SYRINGE | INTRAVENOUS | Status: DC | PRN
  Administered 2021-05-17: 120 mg via INTRAVENOUS

## 2021-05-17 MED ORDER — ONDANSETRON HCL 4 MG/2ML IJ SOLN: 4.0000 mg | Freq: Four times a day (QID) | INTRAMUSCULAR | Status: DC | PRN

## 2021-05-17 MED ORDER — LEVOCETIRIZINE DIHYDROCHLORIDE 5 MG PO TABS: 5.0000 mg | ORAL_TABLET | Freq: Every day | ORAL | Status: DC

## 2021-05-17 MED ORDER — ACETAMINOPHEN 325 MG PO TABS
650.0000 mg | ORAL_TABLET | ORAL | Status: DC | PRN
  Filled 2021-05-17: qty 2

## 2021-05-17 MED ORDER — ESMOLOL HCL 100 MG/10ML IV SOLN
INTRAVENOUS | Status: AC
  Filled 2021-05-17: qty 10

## 2021-05-17 MED ORDER — LEFLUNOMIDE 20 MG PO TABS
10.0000 mg | ORAL_TABLET | Freq: Every day | ORAL | Status: DC
  Administered 2021-05-17 – 2021-05-18 (×2): 10 mg via ORAL
  Filled 2021-05-17 (×2): qty 0.5

## 2021-05-17 MED ORDER — FENTANYL CITRATE (PF) 250 MCG/5ML IJ SOLN
INTRAMUSCULAR | Status: DC | PRN
  Administered 2021-05-17: 100 ug via INTRAVENOUS
  Administered 2021-05-17 (×5): 50 ug via INTRAVENOUS

## 2021-05-17 MED ORDER — SODIUM CHLORIDE 0.9% FLUSH: 3.0000 mL | INTRAVENOUS | Status: DC | PRN

## 2021-05-17 MED ORDER — MONTELUKAST SODIUM 10 MG PO TABS
10.0000 mg | ORAL_TABLET | Freq: Every day | ORAL | Status: DC
  Administered 2021-05-18: 10 mg via ORAL
  Filled 2021-05-17: qty 1

## 2021-05-17 MED ORDER — ROPINIROLE HCL 1 MG PO TABS
0.5000 mg | ORAL_TABLET | Freq: Every day | ORAL | Status: DC
Start: 2021-05-17 — End: 2021-05-18
  Administered 2021-05-17: 0.5 mg via ORAL
  Filled 2021-05-17: qty 1

## 2021-05-17 MED ORDER — OXYCODONE HCL 5 MG PO TABS
10.0000 mg | ORAL_TABLET | ORAL | Status: DC | PRN
  Filled 2021-05-17: qty 2

## 2021-05-17 MED ORDER — BISOPROLOL FUMARATE 10 MG PO TABS
10.0000 mg | ORAL_TABLET | Freq: Every day | ORAL | Status: DC
  Administered 2021-05-18: 10 mg via ORAL
  Filled 2021-05-17: qty 1

## 2021-05-17 MED ORDER — CHLORHEXIDINE GLUCONATE 0.12 % MT SOLN
OROMUCOSAL | Status: AC
  Filled 2021-05-17: qty 15

## 2021-05-17 SURGICAL SUPPLY — 69 items
BAG COUNTER SPONGE SURGICOUNT (BAG) ×3 IMPLANT
BAG DECANTER FOR FLEXI CONT (MISCELLANEOUS) ×1 IMPLANT
BENZOIN TINCTURE PRP APPL 2/3 (GAUZE/BANDAGES/DRESSINGS) ×2 IMPLANT
BLADE BONE MILL MEDIUM (MISCELLANEOUS) ×1 IMPLANT
BLADE CLIPPER SURG (BLADE) IMPLANT
BONE GRAFTON DBF INJECT 6CC (Bone Implant) ×1 IMPLANT
BUR CUTTER 7.0 ROUND (BURR) IMPLANT
BUR MATCHSTICK NEURO 3.0 LAGG (BURR) ×2 IMPLANT
CAGE INTERBODY PL LG 7X26.5X24 (Cage) ×2 IMPLANT
CANISTER SUCT 3000ML PPV (MISCELLANEOUS) ×2 IMPLANT
CAP LCK SPNE (Orthopedic Implant) ×8 IMPLANT
CAP LOCK SPINE RADIUS (Orthopedic Implant) IMPLANT
CAP LOCKING (Orthopedic Implant) ×8 IMPLANT
CARTRIDGE OIL MAESTRO DRILL (MISCELLANEOUS) ×1 IMPLANT
CNTNR URN SCR LID CUP LEK RST (MISCELLANEOUS) ×1 IMPLANT
CONT SPEC 4OZ STRL OR WHT (MISCELLANEOUS) ×1
COVER BACK TABLE 60X90IN (DRAPES) ×2 IMPLANT
DECANTER SPIKE VIAL GLASS SM (MISCELLANEOUS) ×1 IMPLANT
DERMABOND ADVANCED (GAUZE/BANDAGES/DRESSINGS) ×1
DERMABOND ADVANCED .7 DNX12 (GAUZE/BANDAGES/DRESSINGS) ×1 IMPLANT
DIFFUSER DRILL AIR PNEUMATIC (MISCELLANEOUS) ×2 IMPLANT
DRAPE C-ARM 42X72 X-RAY (DRAPES) ×4 IMPLANT
DRAPE HALF SHEET 40X57 (DRAPES) IMPLANT
DRAPE LAPAROTOMY 100X72X124 (DRAPES) ×2 IMPLANT
DRAPE SURG 17X23 STRL (DRAPES) ×6 IMPLANT
DRSG OPSITE 4X5.5 SM (GAUZE/BANDAGES/DRESSINGS) ×1 IMPLANT
DRSG OPSITE POSTOP 4X10 (GAUZE/BANDAGES/DRESSINGS) ×1 IMPLANT
DRSG OPSITE POSTOP 4X6 (GAUZE/BANDAGES/DRESSINGS) ×1 IMPLANT
DURAPREP 26ML APPLICATOR (WOUND CARE) ×1 IMPLANT
ELECT REM PT RETURN 9FT ADLT (ELECTROSURGICAL) ×2
ELECTRODE REM PT RTRN 9FT ADLT (ELECTROSURGICAL) ×1 IMPLANT
EVACUATOR 1/8 PVC DRAIN (DRAIN) IMPLANT
GAUZE 4X4 16PLY ~~LOC~~+RFID DBL (SPONGE) IMPLANT
GAUZE SPONGE 4X4 12PLY STRL (GAUZE/BANDAGES/DRESSINGS) IMPLANT
GLOVE BIO SURGEON STRL SZ 6.5 (GLOVE) ×7 IMPLANT
GLOVE BIOGEL PI IND STRL 6.5 (GLOVE) ×1 IMPLANT
GLOVE BIOGEL PI IND STRL 7.5 (GLOVE) IMPLANT
GLOVE BIOGEL PI INDICATOR 6.5 (GLOVE) ×3
GLOVE BIOGEL PI INDICATOR 7.5 (GLOVE) ×1
GLOVE ECLIPSE 7.0 STRL STRAW (GLOVE) ×1 IMPLANT
GLOVE ECLIPSE 9.0 STRL (GLOVE) ×4 IMPLANT
GLOVE EXAM NITRILE XL STR (GLOVE) IMPLANT
GOWN STRL REUS W/ TWL LRG LVL3 (GOWN DISPOSABLE) IMPLANT
GOWN STRL REUS W/ TWL XL LVL3 (GOWN DISPOSABLE) ×2 IMPLANT
GOWN STRL REUS W/TWL 2XL LVL3 (GOWN DISPOSABLE) IMPLANT
GOWN STRL REUS W/TWL LRG LVL3 (GOWN DISPOSABLE) ×4
GOWN STRL REUS W/TWL XL LVL3 (GOWN DISPOSABLE) ×2
KIT BASIN OR (CUSTOM PROCEDURE TRAY) ×2 IMPLANT
KIT TURNOVER KIT B (KITS) ×2 IMPLANT
MILL MEDIUM DISP (BLADE) ×2 IMPLANT
NEEDLE HYPO 22GX1.5 SAFETY (NEEDLE) ×2 IMPLANT
NS IRRIG 1000ML POUR BTL (IV SOLUTION) ×2 IMPLANT
OIL CARTRIDGE MAESTRO DRILL (MISCELLANEOUS) ×2
PACK LAMINECTOMY NEURO (CUSTOM PROCEDURE TRAY) ×2 IMPLANT
ROD 90MM RADIUS (Rod) ×2 IMPLANT
SCREW 6.75X35MM (Screw) ×1 IMPLANT
SCREW 6.75X40MM (Screw) ×1 IMPLANT
SPONGE SURGIFOAM ABS GEL 100 (HEMOSTASIS) ×2 IMPLANT
STRIP CLOSURE SKIN 1/2X4 (GAUZE/BANDAGES/DRESSINGS) ×4 IMPLANT
SUT VIC AB 0 CT1 18XCR BRD8 (SUTURE) ×2 IMPLANT
SUT VIC AB 0 CT1 8-18 (SUTURE) ×1
SUT VIC AB 2-0 CT1 18 (SUTURE) ×3 IMPLANT
SUT VIC AB 3-0 SH 8-18 (SUTURE) ×4 IMPLANT
SYS GRAFT DELIVERY OSTEOLINE (MISCELLANEOUS) ×2
SYSTEM GRAFT DELIVERY OSTEOLIN (MISCELLANEOUS) IMPLANT
TOWEL GREEN STERILE (TOWEL DISPOSABLE) ×2 IMPLANT
TOWEL GREEN STERILE FF (TOWEL DISPOSABLE) ×2 IMPLANT
TRAY FOLEY MTR SLVR 16FR STAT (SET/KITS/TRAYS/PACK) ×2 IMPLANT
WATER STERILE IRR 1000ML POUR (IV SOLUTION) ×2 IMPLANT

## 2021-05-17 NOTE — Anesthesia Procedure Notes (Signed)
Procedure Name: Intubation ?Date/Time: 05/17/2021 8:08 AM ?Performed by: Janace Litten, CRNA ?Pre-anesthesia Checklist: Patient identified, Emergency Drugs available, Suction available and Patient being monitored ?Patient Re-evaluated:Patient Re-evaluated prior to induction ?Oxygen Delivery Method: Circle System Utilized ?Preoxygenation: Pre-oxygenation with 100% oxygen ?Induction Type: IV induction and Rapid sequence ?Laryngoscope Size: Mac and 3 ?Grade View: Grade I ?Tube type: Oral ?Tube size: 7.0 mm ?Number of attempts: 1 ?Airway Equipment and Method: Stylet and Oral airway ?Placement Confirmation: ETT inserted through vocal cords under direct vision, positive ETCO2 and breath sounds checked- equal and bilateral ?Secured at: 20 cm ?Tube secured with: Tape ?Dental Injury: Teeth and Oropharynx as per pre-operative assessment  ? ? ? ? ?

## 2021-05-17 NOTE — Transfer of Care (Signed)
Immediate Anesthesia Transfer of Care Note ? ?Patient: Courtney [name] ? ?Procedure(s) Performed: PLIF - Posterior Lateral and Interbody fusion - Lumbar Five-Sacral One (Back) ? ?Patient Location: PACU ? ?Anesthesia Type:General ? ?Level of Consciousness: drowsy, patient cooperative and responds to stimulation ? ?Airway & Oxygen Therapy: Patient Spontanous Breathing ? ?Post-op Assessment: Report given to RN and Post -op Vital signs reviewed and stable ? ?Post vital signs: Reviewed and stable ? ?Last Vitals:  ?Vitals Value Taken Time  ?BP 178/90 05/17/21 1121  ?Temp    ?Pulse 88 05/17/21 1123  ?Resp 17 05/17/21 1123  ?SpO2 91 % 05/17/21 1123  ?Vitals shown include unvalidated device data. ? ?Last Pain:  ?Vitals:  ? 05/17/21 0639  ?TempSrc: Oral  ?PainSc:   ?   ? ?Patients Stated Pain Goal: 0 (05/17/21 6283) ? ?Complications: No notable events documented. ?

## 2021-05-17 NOTE — Anesthesia Postprocedure Evaluation (Signed)
Anesthesia Post Note ? ?Patient: Courtney [name] ? ?Procedure(s) Performed: PLIF - Posterior Lateral and Interbody fusion - Lumbar Five-Sacral One (Back) ? ?  ? ?Patient location during evaluation: PACU ?Anesthesia Type: General ?Level of consciousness: awake ?Pain management: pain level controlled ?Vital Signs Assessment: post-procedure vital signs reviewed and stable ?Respiratory status: spontaneous breathing ?Cardiovascular status: stable ?Postop Assessment: no apparent nausea or vomiting ?Anesthetic complications: no ? ? ?No notable events documented. ? ?Last Vitals:  ?Vitals:  ? 05/17/21 1240 05/17/21 1312  ?BP: (!) 153/60 105/86  ?Pulse: 72 99  ?Resp: 15 18  ?Temp: 36.7 ?C   ?SpO2: 99% 98%  ?  ?Last Pain:  ?Vitals:  ? 05/17/21 1240  ?TempSrc:   ?PainSc: 4   ? ? ?  ?  ?  ?  ?  ?  ? ?Demoni Parmar ? ? ? ? ?

## 2021-05-17 NOTE — H&P (Signed)
?Courtney [name] is an 81 y.o. female.   ?Chief Complaint: Right leg pain ?HPI: 81 year old female status post prior L3-4 and L4-5 decompression and fusion surgery presents with persistent right lower extremity radicular pain which is failed nonoperative conservative management work-up demonstrates evidence of prior operative change in the right-sided L5-S1 with advanced disc degeneration and severe foraminal stenosis causing right-sided L5 nerve root compression.  Patient presents now for L5-S1 decompression and fusion in hopes of improving her symptoms. ? ?Past Medical History:  ?Diagnosis Date  ? Arthritis   ? rheumatoid arthritis  ? B12 deficiency   ? Cancer [facility])   ? skin cancer (squamous cell)  ? Collagen vascular disease ([place])   ? RA  ? Dermatitis   ? Diabetes mellitus without complication ([place])   ? type 2  ? Eczema   ? 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 GERD (gastroesophageal reflux disease)   ? Headache   ? migraines in the past (none since menopause)  ? History of hiatal hernia   ? Hives   ? "chronic"  ? Hypertension   ? Loose stools   ? Osteoarthritis   ? lumbar spine, cervical spine, plantar fascitis   ? Pneumonia   ? PONV (postoperative nausea and vomiting)   ? Psoriasis   ? Rosacea   ? Sleep apnea 2004  ? does not use cpap  ? Urethral stenosis   ? w/ bladder polyps. followed by Dr Madelin Headings   ? 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 Varicose veins   ? ? ?Past Surgical History:  ?Procedure Laterality Date  ? ANTERIOR CERVICAL DECOMP/DISCECTOMY FUSION N/A 08/20/2017  ? Procedure: Anterior Cervical Decompression Fusion - Cervical Four - Cervical Five - Cervical Five - Cervical Six;  Surgeon: Earnie Larsson, MD;  Location: [place];  Service: Neurosurgery;  Laterality: N/A;  Anterior Cervical Decompression Fusion - Cervical Four - Cervical Five - Cervical Five - Cervical Six  ? APPENDECTOMY    ? BILATERAL CARPAL TUNNEL RELEASE    ? BREAST CYST ASPIRATION Left   ? neg  ? CATARACT EXTRACTION Bilateral   ?  CHOLECYSTECTOMY  1963  ? COLONOSCOPY WITH PROPOFOL N/A 12/31/2015  ? Procedure: COLONOSCOPY WITH PROPOFOL;  Surgeon: Lollie Sails, MD;  Location: [facility] ENDOSCOPY;  Service: Endoscopy;  Laterality: N/A;  ? ENDOMETRIAL ABLATION  1991  ? ESOPHAGOGASTRODUODENOSCOPY (EGD) WITH PROPOFOL N/A 12/31/2015  ? Procedure: ESOPHAGOGASTRODUODENOSCOPY (EGD) WITH PROPOFOL;  Surgeon: Lollie Sails, MD;  Location: [facility] ENDOSCOPY;  Service: Endoscopy;  Laterality: N/A;  ? ESOPHAGOGASTRODUODENOSCOPY (EGD) WITH PROPOFOL N/A 06/02/2016  ? Procedure: ESOPHAGOGASTRODUODENOSCOPY (EGD) WITH PROPOFOL;  Surgeon: Lollie Sails, MD;  Location: [facility] ENDOSCOPY;  Service: Endoscopy;  Laterality: N/A;  ? ESOPHAGOGASTRODUODENOSCOPY (EGD) WITH PROPOFOL N/A 11/07/2016  ? Procedure: ESOPHAGOGASTRODUODENOSCOPY (EGD) WITH PROPOFOL;  Surgeon: Lollie Sails, MD;  Location: [facility] ENDOSCOPY;  Service: Endoscopy;  Laterality: N/A;  ? EYE SURGERY Bilateral 2006 and 2012  ? cataract surgery with lens implant  ? eyelid surgery Bilateral 2014  ? FOOT SURGERY Right   ? ligament and spurs  ? LUMBAR LAMINECTOMY/DECOMPRESSION MICRODISCECTOMY Right 12/24/2013  ? Procedure: RIGHT LUMBAR THREE TO FOUR, LUMBAR FOUR TO FIVE, LUMBAR FIVE TO SACRAL ONE LAMINECTOMY/FORAMINOTOMY;  Surgeon: Floyce Stakes, MD;  Location: [facility] NEURO ORS;  Service: Neurosurgery;  Laterality: Right;  RIGHT L3-4 L4-5 L5-S1 LAMINECTOMY/FORAMINOTOMY  ? NASAL SEPTUM SURGERY  2004  ? SHOULDER SURGERY Right   ? for a frozen shoulder  ? SKIN  CANCER EXCISION Right   ? leg x 4  ? skin cancer removal    ? TONSILLECTOMY    ? TUBAL LIGATION  1968  ? ? ?Family History  ?Problem Relation Age of Onset  ? COPD Mother   ? Arthritis/Rheumatoid Mother   ? Alzheimer's disease Father   ? Heart attack Father   ? Breast cancer Paternal Aunt 48  ? ?Social History:  reports that she has never smoked. She has never used smokeless tobacco. She reports that she does not drink alcohol and does not use  drugs. ? ?Allergies:  ?Allergies  ?Allergen Reactions  ? Methotrexate Derivatives Shortness Of Breath  ?  Breathing difficulties   ? Acyclovir And Related Rash  ? Codeine Nausea Only  ? Contrast Media [Iodinated Contrast Media] Itching and Rash  ? Inderide [Propranolol-Hctz] Rash  ? Lodine [Etodolac] Rash  ? Metrizamide Itching and Rash  ? Procardia [Nifedipine] Rash  ? ? ?Medications Prior to Admission  ?Medication Sig Dispense Refill  ? acetaminophen (TYLENOL) 500 MG tablet Take 500 mg by mouth every 6 (six) hours as needed (for pain.).     ? Ascorbic Acid (VITAMIN C) 1000 MG tablet Take 1,000 mg by mouth daily.    ? aspirin EC 81 MG tablet Take 81 mg by mouth daily.    ? bisoprolol (ZEBETA) 10 MG tablet Take 1 tablet (10 mg total) by mouth daily. 30 tablet 1  ? cetirizine (ZYRTEC) 10 MG tablet Take 10 mg by mouth at bedtime.     ? inFLIXimab (REMICADE) 100 MG injection Inject 500 mg into the vein every 6 (six) weeks.    ? insulin NPH Human (HUMULIN N,NOVOLIN N) 100 UNIT/ML injection Inject 0.4 mLs (40 Units total) into the skin daily before breakfast. (Patient taking differently: Inject 40 Units into the skin in the morning and at bedtime.) 10 mL 11  ? leflunomide (ARAVA) 10 MG tablet Take 10 mg by mouth daily.    ? levocetirizine (XYZAL) 5 MG tablet Take 5 mg by mouth at bedtime.    ? lisinopril (PRINIVIL,ZESTRIL) 20 MG tablet Take 20 mg by mouth daily.    ? montelukast (SINGULAIR) 10 MG tablet Take 10 mg by mouth daily.    ? pantoprazole (PROTONIX) 40 MG tablet Take 40 mg by mouth daily.    ? rOPINIRole (REQUIP) 0.5 MG tablet Take 0.5 mg by mouth at bedtime.    ? simvastatin (ZOCOR) 40 MG tablet Take 40 mg by mouth at bedtime.    ? spironolactone (ALDACTONE) 25 MG tablet Take 25 mg by mouth daily.    ? tiZANidine (ZANAFLEX) 4 MG tablet Take 4 mg by mouth 3 (three) times daily as needed for muscle spasms.    ? ? ?Results for orders placed or performed during the hospital encounter of 05/17/21 (from the past 48  hour(s))  ?Glucose, capillary     Status: Abnormal  ? Collection Time: 05/17/21  6:42 AM  ?Result Value Ref Range  ? Glucose-Capillary 138 (H) 70 - 99 mg/dL  ?  Comment: Glucose reference range applies only to samples taken after fasting for at least 8 hours.  ? ?No results found. ? ?Pertinent items noted in HPI and remainder of comprehensive ROS otherwise negative. ? ?Blood pressure (!) 187/59, pulse 74, temperature (!) 97.4 ?F (36.3 ?C), temperature source Oral, resp. rate 18, height '5\' 6"'$  (1.676 m), weight 89.4 kg, SpO2 96 %. ? ?Patient is awake and alert.  She is oriented and appropriate.  Speech is fluent.  Judgment insight are intact.  Cranial nerve function normal bilaterally motor examination reveals intact motor strength bilateral sensory examination with some decrease sensation pinprick light touch in her right L5 dermatome.  Deep tendon reflexes are normal active except Achilles reflexes are absent bilaterally.  Gait is antalgic.  Posture is flexed peer examination head ears eyes nose and throat is unremarked.  Chest and abdomen are benign.  Extremities are free from injury or deformity. 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  Plan bilateral L5-S1 decompressive laminotomies and foraminotomies followed by posterior lumbar MRI fusion utilizing interbody cages, local harvested autograft, and augmented with posterior lateral arthrodesis utilizing nonsegmental pedicle screw fixation local autograft.  Risks and benefits of been explained.  Patient wishes to proceed. ? ?Leolia Vinzant A Krystyne Tewksbury ?05/17/2021, 7:52 AM ? ? ? ?

## 2021-05-17 NOTE — Plan of Care (Signed)
?  Problem: Safety: ?Goal: Ability to remain free from injury will improve ?Outcome: Completed/Met ?  ?Problem: Education: ?Goal: Ability to verbalize activity precautions or restrictions will improve ?Outcome: Completed/Met ?Goal: Knowledge of the prescribed therapeutic regimen will improve ?Outcome: Completed/Met ?Goal: Understanding of discharge needs will improve ?Outcome: Completed/Met ?  ?Problem: Activity: ?Goal: Ability to avoid complications of mobility impairment will improve ?Outcome: Completed/Met ?Goal: Ability to tolerate increased activity will improve ?Outcome: Completed/Met ?Goal: Will remain free from falls ?Outcome: Completed/Met ?  ?Problem: Bowel/Gastric: ?Goal: Gastrointestinal status for postoperative course will improve ?Outcome: Completed/Met ?  ?Problem: Clinical Measurements: ?Goal: Ability to maintain clinical measurements within normal limits will improve ?Outcome: Completed/Met ?Goal: Postoperative complications will be avoided or minimized ?Outcome: Completed/Met ?Goal: Diagnostic test results will improve ?Outcome: Completed/Met ?  ?Problem: Pain Management: ?Goal: Pain level will decrease ?Outcome: Completed/Met ?  ?Problem: Skin Integrity: ?Goal: Will show signs of wound healing ?Outcome: Completed/Met ?  ?Problem: Health Behavior/Discharge Planning: ?Goal: Identification of resources available to assist in meeting health care needs will improve ?Outcome: Completed/Met ?  ?Problem: Bladder/Genitourinary: ?Goal: Urinary functional status for postoperative course will improve ?Outcome: Completed/Met ?  ?Problem: Acute Rehab PT Goals(only PT should resolve) ?Goal: Patient Will Transfer Sit To/From Stand ?Outcome: Completed/Met ?Goal: Pt Will Ambulate ?Outcome: Completed/Met ?Goal: Pt Will Go Up/Down Stairs ?Outcome: Completed/Met ?Goal: Pt Will Verbalize and Adhere to Precautions While ?Description: PT Will Verbalize and Adhere to Precautions While Performing Mobility ?Outcome:  Completed/Met ?  ?

## 2021-05-17 NOTE — Evaluation (Signed)
Physical Therapy Evaluation ?Patient Details ?Name: Courtney [name] ?MRN: [id number] ?DOB: 23-Nov-1940 ?Today's Date: 05/17/2021 ? ?History of Present Illness ? 81 y.o. female presenting for L5-S1 PLIF.  PMHx significant for previous spinal surgeries: L3-4 decompression and fusion 12/2018, L4-5 PLIF 2022, HTN, Hx skin cancer, DMII, OA, and OSA, RA  ?Clinical Impression ? Pt admitted with above diagnosis. Pt is from home with her husband and has been through this process multiple times. She has the needed equipment and support for return home. Pt ambulated 80' with min A and RW. RLE weaker than L but she is not experiencing the radicular pain that she had before surgery, describes her current pain as surgical pain. Pt has had HHPT in the past and reports she prefers to so water exercise because of her RA. Recommend that she resume this when cleared by MD to be in the water.  Pt currently with functional limitations due to the deficits listed below (see PT Problem List). Pt will benefit from skilled PT to increase their independence and safety with mobility to allow discharge to the venue listed below.   ?   ?   ? ?Recommendations for follow up therapy are one component of a multi-disciplinary discharge planning process, led by the attending physician.  Recommendations may be updated based on patient status, additional functional criteria and insurance authorization. ? ?Follow Up Recommendations No PT follow up ? ?  ?Assistance Recommended at Discharge Intermittent Supervision/Assistance  ?Patient can return home with the following ? A little help with walking and/or transfers;Help with stairs or ramp for entrance;Assist for transportation;Assistance with cooking/housework ? ?  ?Equipment Recommendations None recommended by PT  ?Recommendations for Other Services ?    ?  ?Functional Status Assessment Patient has had a recent decline in their functional status and demonstrates the ability to make significant improvements in  function in a reasonable and predictable amount of time.  ? ?  ?Precautions / Restrictions Precautions ?Precautions: Back ?Precaution Booklet Issued: Yes (comment) ?Precaution Comments: pt able to remember no bending and twisting from past surgeries, vc's for no lifting. Reviewed proper posture and positioning ?Required Braces or Orthoses: Spinal Brace ?Spinal Brace: Lumbar corset;Applied in sitting position ?Restrictions ?Weight Bearing Restrictions: No  ? ?  ? ?Mobility ? Bed Mobility ?Overal bed mobility: Needs Assistance ?Bed Mobility: Rolling, Sidelying to Sit, Sit to Sidelying ?Rolling: Supervision ?Sidelying to sit: Supervision ?  ?  ?Sit to sidelying: Min assist ?General bed mobility comments: pt able to roll L and come to SL with increased time but no physical assist. Min A to LE's for return to SL ?  ? ?Transfers ?Overall transfer level: Needs assistance ?Equipment used: Rolling walker (2 wheels) ?Transfers: Sit to/from Stand ?Sit to Stand: Min guard ?  ?  ?  ?  ?  ?General transfer comment: min guard for safety and increased time needed. Close guarding for transfer of pt's hands from bed to RW ?  ? ?Ambulation/Gait ?Ambulation/Gait assistance: Min assist ?Gait Distance (Feet): 80 Feet ?Assistive device: Rolling walker (2 wheels) ?Gait Pattern/deviations: Step-through pattern, Trendelenburg ?Gait velocity: decreased ?Gait velocity interpretation: <1.8 ft/sec, indicate of risk for recurrent falls ?  ?General Gait Details: noted RLE weakness and pt with kyphosis and forward head, can correct minimally when she is in static standing but cannot maintain during gait. ? ?Stairs ?  ?  ?  ?  ?  ? ?Wheelchair Mobility ?  ? ?Modified Rankin (Stroke Patients Only) ?  ? ?  ? ?  Balance Overall balance assessment: Mild deficits observed, not formally tested ?  ?  ?  ?  ?  ?  ?  ?  ?  ?  ?  ?  ?  ?  ?  ?  ?  ?  ?   ? ? ? ?Pertinent Vitals/Pain Pain Assessment ?Pain Assessment: Faces ?Faces Pain Scale: Hurts little  more ?Pain Location: back ?Pain Descriptors / Indicators: Sore ?Pain Intervention(s): Limited activity within patient's tolerance, Monitored during session  ? ? ?Home Living Family/patient expects to be discharged to:: Private residence ?Living Arrangements: Spouse/significant other ?Available Help at Discharge: Family;Available 24 hours/day ?Type of Home: House ?Home Access: Stairs to enter ?Entrance Stairs-Rails:  (grab bar) ?Entrance Stairs-Number of Steps: 2 ?  ?Home Layout: One level ?Home Equipment: [name] - single point;Cane - quad;BSC/3in1;Shower seat - built in;Shower seat;Rollator (4 wheels) ?Additional Comments: pt's husband has been assisting her and is very capable per her report.  ?  ?Prior Function Prior Level of Function : Needs assist ?  ?  ?  ?  ?  ?  ?Mobility Comments: pt does not drive. Has been using RW to ambulate independently. ?  ?  ? ? ?Hand Dominance  ? Dominant Hand: Right ? ?  ?Extremity/Trunk Assessment  ? Upper Extremity Assessment ?Upper Extremity Assessment: RUE deficits/detail ?RUE Deficits / Details: pt has RA so has to be mindful of how much wt she puts through UE's on RW ?  ? ?Lower Extremity Assessment ?Lower Extremity Assessment: RLE deficits/detail ?RLE Deficits / Details: RLE weaker than L, hip flex 3-/5, mild trendelenburg with gait ?RLE Sensation: history of peripheral neuropathy ?RLE Coordination: WNL ?  ? ?Cervical / Trunk Assessment ?Cervical / Trunk Assessment: Kyphotic;Back Surgery  ?Communication  ? Communication: No difficulties  ?Cognition Arousal/Alertness: Awake/alert ?Behavior During Therapy: [facility] for tasks assessed/performed ?Overall Cognitive Status: Within Functional Limits for tasks assessed ?  ?  ?  ?  ?  ?  ?  ?  ?  ?  ?  ?  ?  ?  ?  ?  ?  ?  ?  ? ?  ?General Comments General comments (skin integrity, edema, etc.): pt with good understanding of modifications to activities at home to keep back precautions. Pt has done HHPT but prefers to get in the water once  cleared as this is better for her RA ? ?  ?Exercises    ? ?Assessment/Plan  ?  ?PT Assessment Patient needs continued PT services  ?PT Problem List Decreased activity tolerance;Decreased mobility;Pain ? ?   ?  ?PT Treatment Interventions DME instruction;Gait training;Stair training;Functional mobility training;Therapeutic activities;Therapeutic exercise;Balance training;Patient/family education   ? ?PT Goals (Current goals can be found in the Care Plan section)  ?Acute Rehab PT Goals ?Patient Stated Goal: return home and find somewhere to restart water exercise ?PT Goal Formulation: With patient ?Time For Goal Achievement: 05/24/21 ?Potential to Achieve Goals: Good ? ?  ?Frequency Min 5X/week ?  ? ? ?Co-evaluation   ?  ?  ?  ?  ? ? ?  ?AM-PAC PT "6 Clicks" Mobility  ?Outcome Measure Help needed turning from your back to your side while in a flat bed without using bedrails?: None ?Help needed moving from lying on your back to sitting on the side of a flat bed without using bedrails?: None ?Help needed moving to and from a bed to a chair (including a wheelchair)?: A Little ?Help needed standing up from a chair using your arms (  e.g., wheelchair or bedside chair)?: A Little ?Help needed to walk in hospital room?: A Little ?Help needed climbing 3-5 steps with a railing? : A Little ?6 Click Score: 20 ? ?  ?End of Session Equipment Utilized During Treatment: Gait belt;Back brace ?Activity Tolerance: Patient tolerated treatment well ?Patient left: in bed;with call bell/phone within reach ?Nurse Communication: Mobility status ?PT Visit Diagnosis: Pain;Difficulty in walking, not elsewhere classified (R26.2) ?Pain - Right/Left: Right ?Pain - part of body: Leg ?  ? ?Time: 6962-9528 ?PT Time Calculation (min) (ACUTE ONLY): 31 min ? ? ?Charges:   PT Evaluation ?$PT Eval Low Complexity: 1 Low ?PT Treatments ?$Gait Training: 8-22 mins ?  ?   ? ? ?Leighton Roach, PT  ?Acute Rehab Services ? Pager [phone] ?Office  [phone] ? ? ?[place] ?05/17/2021, 4:46 PM ? ?

## 2021-05-17 NOTE — Brief Op Note (Signed)
05/17/2021 ? ?11:09 AM ? ?PATIENT:  Courtney [name]  80 y.o. female ? ?PRE-OPERATIVE DIAGNOSIS:  Spondylolisthesis ? ?POST-OPERATIVE DIAGNOSIS:  Spondylolisthesis ? ?PROCEDURE:  Procedure(s): ?PLIF - Posterior Lateral and Interbody fusion - Lumbar Five-Sacral One (N/A) ? ?SURGEON:  Surgeon(s) and Role: ?   Earnie Larsson, MD - Primary ?   Consuella Lose, MD - Assisting ? ?PHYSICIAN ASSISTANT:  ? ?ASSISTANTS: Bergman,NP  ? ?ANESTHESIA:   general ? ?EBL:  200 mL  ? ?BLOOD ADMINISTERED:none ? ?DRAINS: (med) Hemovact drain(s) in the deep wound space with  Suction Open  ? ?LOCAL MEDICATIONS USED:  MARCAINE    ? ?SPECIMEN:  No Specimen ? ?DISPOSITION OF SPECIMEN:  N/A ? ?COUNTS:  YES ? ?TOURNIQUET:  * No tourniquets in log * ? ?DICTATION: .Dragon Dictation ? ?PLAN OF CARE: Admit to inpatient  ? ?PATIENT DISPOSITION:  PACU - hemodynamically stable. ?  ?Delay start of Pharmacological VTE agent (>24hrs) due to surgical blood loss or risk of bleeding: yes ? ?

## 2021-05-17 NOTE — Op Note (Signed)
Date of procedure: 05/17/2021 ? ?Date of dictation: Same tissue is ? ?Service: Neurosurgery ? ?Preoperative diagnosis: L5-S1 foraminal stenosis with radiculopathy ? ?Postoperative diagnosis: Same ? ?Procedure Name: Bilateral L5-S1 decompressive laminotomies and foraminotomies, redo, more than would be required for simple interbody fusion alone. ? ?L5-S1 posterior lumbar interbody fusion utilizing interbody cages and locally harvested autograft and allograft ? ?L5-S1 posterior lateral arthrodesis utilizing segmental pedicle screw fixation from L3-S1 and local autografting. ? ?Surgeon:Vinie Charity A.Devorah Givhan, M.D. ? ?Asst. Surgeon: Kathyrn Sheriff, MD; Reinaldo Meeker, NP ? ?Anesthesia: General ? ?Indication: 81 year old female status post prior L3-L5 decompression and fusion presents with worsening back and right lower extremity pain failing all conservative management.  Work-up demonstrates evidence of significant and worsening foraminal stenosis at L5-S1 with compression of her exiting L5 nerve roots bilaterally right greater than left.  Patient has failed conservative management presents now for decompression and fusion in hopes of improving her symptoms. ? ?Operative note: After induction of anesthesia, patient position prone onto Wilson frame and appropriate padded.  Lumbar region prepped and draped sterilely.  Incision made overlying L3-S1.  Dissection performed bilaterally.  Retractor placed.  Fluoroscopy used.  Levels confirmed.  Previous laminotomy on the right side at L5-S1 was dissected free.  Decompressive laminotomies and facetectomies then performed bilaterally using [place] rongeurs, care centers and high-speed drill to remove the inferior three quarters of the lamina of L5 and the inferior facet of L5 bilaterally.  The majority the superior facet of S1 was resected bilaterally.  Ligament flavum and epidural scar were elevated and resected.  Foraminotomies completed on the course exiting L5 and S1 nerve roots bilaterally.   Bilateral discectomies were then performed at L5-S1.  The space then prepared for interbody fusion.  With distractor placed patient's right side to space was further cleaned on the left side.  A 7 mm Medtronic expandable cage was then impacted in the place and expanded.  Distractor removed patient's right side.  Disc base prepared on the right side.  Morselized autograft packed in the interspace.  A second cage was then impacted into place and expanded.  Pedicles of S1 were then identified using surface landmarks and intraoperative fluoroscopy and superficial bone by the pedicle was then removed using high-speed drill each pedicle was then probed using a pedicle all each pedicle tract was then tapped with a screw tap.  Screw pedicle was probed and found to be solidly within bone.  6.75 mm radius brand screws from [employer] placed bilaterally at S1.  Final images reveal good position the cages and hardware proper upper level with normal alignment of the spine.  Previous construct from L3-L5 was disassembled.  Rods were removed.  Fusion was inspected and found to be solid.  New rod was then placed from L3-S1.  Locking caps were then placed.  Locking caps were then secured with a construct under mild compression.  Transverse processes and sacral ala were decorticated.  Morselized autograft was packed posterior laterally.  Medium Hemovac drain was left in the deep wound space.  Graft on allograft was packed within the cages bilaterally.  Gelfoam was placed over the laminotomy defects.  Wound was then closed in layers with Vicryl sutures.  Vancomycin powder was left in the deep wound space. ? ?

## 2021-05-17 NOTE — Progress Notes (Signed)
Orthopedic Tech Progress Note ?Patient Details:  ?[name] [birth year] ?[id number] ? ?Patient has back brace ? ?Patient ID: Courtney [name], female   DOB: 17-Jul-1940, 81 y.o.   MRN: [id number] ? ?Courtney [name] ?05/17/2021, 3:07 PM ? ?

## 2021-05-18 DIAGNOSIS — M4807 Spinal stenosis, lumbosacral region: Secondary | ICD-10-CM | POA: Diagnosis not present

## 2021-05-18 LAB — GLUCOSE, CAPILLARY: Glucose-Capillary: 271 mg/dL — ABNORMAL HIGH (ref 70–99)

## 2021-05-18 MED ORDER — HYDROCODONE-ACETAMINOPHEN 10-325 MG PO TABS: 1.0000 | ORAL_TABLET | ORAL | 0 refills | Status: DC | PRN

## 2021-05-18 MED FILL — Sodium Chloride IV Soln 0.9%: INTRAVENOUS | Qty: 1000 | Status: AC

## 2021-05-18 MED FILL — Heparin Sodium (Porcine) Inj 1000 Unit/ML: INTRAMUSCULAR | Qty: 30 | Status: AC

## 2021-05-18 NOTE — Progress Notes (Addendum)
Inpatient Diabetes Program Recommendations ? ?AACE/ADA: New Consensus Statement on Inpatient Glycemic Control (2015) ? ?Target Ranges:  Prepandial:   less than 140 mg/dL ?     Peak postprandial:   less than 180 mg/dL (1-2 hours) ?     Critically ill patients:  140 - 180 mg/dL  ? ?Lab Results  ?Component Value Date  ? GLUCAP 271 (H) 05/18/2021  ? HGBA1C 6.2 (H) 05/17/2021  ? ? ?Review of Glycemic Control ? Latest Reference Range & Units 05/17/21 11:23 05/17/21 16:34 05/17/21 21:08 05/18/21 06:46  ?Glucose-Capillary 70 - 99 mg/dL 153 (H) 432 (H) 328 (H) 271 (H)  ?(H): Data is abnormally high ?Diabetes history: Type 2 DM ?Outpatient Diabetes medications: NPH 40 units BID ?Current orders for Inpatient glycemic control: NPH 40 units BID, Novolog 0-15 units TID ?Decadron 5 mg x 1 ? ?Inpatient Diabetes Program Recommendations:   ? ?If to remain inpatient: Consider increasing correction to Novolog 0-20 units TID. Assuming elevated this AM due to steroids while intraop.  ? ?  ? ?Thanks, ?Bronson Curb, MSN, RNC-OB ?Diabetes Coordinator ?[phone] (8a-5p) ? ? ? ? ?

## 2021-05-18 NOTE — Progress Notes (Signed)
Physical Therapy Treatment ?Patient Details ?Name: Courtney [name] ?MRN: [id number] ?DOB: 10/21/1940 ?Today's Date: 05/18/2021 ? ? ?History of Present Illness 81 y.o. female presenting for L5-S1 PLIF.  PMHx significant for previous spinal surgeries: L3-4 decompression and fusion 12/2018, L4-5 PLIF 2022, HTN, Hx skin cancer, DMII, OA, and OSA, RA ? ?  ?PT Comments  ? ? Pt progressing well with post-op mobility. She was able to demonstrate transfers and ambulation with gross min guard assist to supervision for safety and stair training with min assist. Pt was educated on precautions, brace application/wearing schedule, appropriate activity progression, and car transfer. Will continue to follow.   ?   ?Recommendations for follow up therapy are one component of a multi-disciplinary discharge planning process, led by the attending physician.  Recommendations may be updated based on patient status, additional functional criteria and insurance authorization. ? ?Follow Up Recommendations ? No PT follow up ?  ?  ?Assistance Recommended at Discharge Intermittent Supervision/Assistance  ?Patient can return home with the following A little help with walking and/or transfers;Help with stairs or ramp for entrance;Assist for transportation;Assistance with cooking/housework ?  ?Equipment Recommendations ? None recommended by PT  ?  ?Recommendations for Other Services   ? ? ?  ?Precautions / Restrictions Precautions ?Precautions: Fall;Back ?Precaution Booklet Issued: Yes (comment) ?Precaution Comments: Reviewed handout and pt was cued for precautions during functional mobility. ?Required Braces or Orthoses: Spinal Brace ?Spinal Brace: Lumbar corset;Applied in sitting position ?Restrictions ?Weight Bearing Restrictions: No  ?  ? ?Mobility ? Bed Mobility ?  ?  ?  ?  ?  ?  ?  ?General bed mobility comments: Pt was received sitting up in the recliner. ?  ? ?Transfers ?Overall transfer level: Needs assistance ?Equipment used: Rolling walker  (2 wheels) ?Transfers: Sit to/from Stand ?Sit to Stand: Supervision, Min guard ?  ?  ?  ?  ?  ?General transfer comment: Pt demonstrated proper hand placement on seated surface for safety. No assist required. VC's for controlled lower to chair and hands on guarding for stand>sit. ?  ? ?Ambulation/Gait ?Ambulation/Gait assistance: Min guard ?Gait Distance (Feet): 200 Feet ?Assistive device: Rolling walker (2 wheels) ?Gait Pattern/deviations: Step-through pattern, Trendelenburg ?Gait velocity: decreased ?Gait velocity interpretation: 1.31 - 2.62 ft/sec, indicative of limited community ambulator ?  ?General Gait Details: noted RLE weakness and pt with kyphosis and forward head, can correct minimally when she is in static standing but cannot maintain during gait. ? ? ?Stairs ?Stairs: Yes ?Stairs assistance: Min assist ?Stair Management: One rail Right, Step to pattern, Forwards ?Number of Stairs: 2 ?General stair comments: VC's for sequencing and general safety. Min assist for power up to next step - LE's a little buckly but pt able to recover with min assist. ? ? ?Wheelchair Mobility ?  ? ?Modified Rankin (Stroke Patients Only) ?  ? ? ?  ?Balance Overall balance assessment: Mild deficits observed, not formally tested ?  ?  ?  ?  ?  ?  ?  ?  ?  ?  ?  ?  ?  ?  ?  ?  ?  ?  ?  ? ?  ?Cognition Arousal/Alertness: Awake/alert ?Behavior During Therapy: [facility] for tasks assessed/performed ?Overall Cognitive Status: Within Functional Limits for tasks assessed ?  ?  ?  ?  ?  ?  ?  ?  ?  ?  ?  ?  ?  ?  ?  ?  ?  ?  ?  ? ?  ?  Exercises   ? ?  ?General Comments   ?  ?  ? ?Pertinent Vitals/Pain Pain Assessment ?Pain Assessment: Faces ?Faces Pain Scale: Hurts little more ?Pain Location: back ?Pain Descriptors / Indicators: Operative site guarding, Sore ?Pain Intervention(s): Limited activity within patient's tolerance, Monitored during session, Repositioned  ? ? ?Home Living   ?  ?  ?  ?  ?  ?  ?  ?  ?  ?   ?  ?Prior Function    ?  ?  ?    ? ?PT Goals (current goals can now be found in the care plan section) Acute Rehab PT Goals ?Patient Stated Goal: return home and find somewhere to restart water exercise ?PT Goal Formulation: With patient ?Time For Goal Achievement: 05/24/21 ?Potential to Achieve Goals: Good ?Progress towards PT goals: Progressing toward goals ? ?  ?Frequency ? ? ? Min 5X/week ? ? ? ?  ?PT Plan Current plan remains appropriate  ? ? ?Co-evaluation   ?  ?  ?  ?  ? ?  ?AM-PAC PT "6 Clicks" Mobility   ?Outcome Measure ? Help needed turning from your back to your side while in a flat bed without using bedrails?: None ?Help needed moving from lying on your back to sitting on the side of a flat bed without using bedrails?: None ?Help needed moving to and from a bed to a chair (including a wheelchair)?: A Little ?Help needed standing up from a chair using your arms (e.g., wheelchair or bedside chair)?: A Little ?Help needed to walk in hospital room?: A Little ?Help needed climbing 3-5 steps with a railing? : A Little ?6 Click Score: 20 ? ?  ?End of Session Equipment Utilized During Treatment: Gait belt;Back brace ?Activity Tolerance: Patient tolerated treatment well ?Patient left: in bed;with call bell/phone within reach ?Nurse Communication: Mobility status ?PT Visit Diagnosis: Pain;Difficulty in walking, not elsewhere classified (R26.2) ?Pain - Right/Left: Right ?Pain - part of body: Leg ?  ? ? ?Time: 5732-2025 ?PT Time Calculation (min) (ACUTE ONLY): 15 min ? ?Charges:  $Gait Training: 8-22 mins          ?          ? ?Rolinda Roan, PT, DPT ?Acute Rehabilitation Services ?Secure Chat Preferred ?Office: [phone]  ? ? ?[name] ?05/18/2021, 9:43 AM ? ?

## 2021-05-18 NOTE — Discharge Summary (Signed)
Physician Discharge Summary  ?Patient ID: ?Courtney [name] ?MRN: [id number] ?DOB/AGE: Apr 26, 1940 81 y.o. ? ?Admit date: 05/17/2021 ?Discharge date: 05/18/2021 ? ?Admission Diagnoses: ? ?Discharge Diagnoses:  ?Principal Problem: ?  Lumbar radiculopathy ? ? ?Discharged Condition: good ? ?Hospital Course: Patient mid to the hospital where she underwent uncomplicated N4-O2 decompression and fusion surgery.  Postoperatively doing very well.  Back pain minimal.  Preoperative radicular pain resolved.  Standing ambulating and voiding without difficulty.  Ready for discharge home. ? ?Consults:  ? ?Significant Diagnostic Studies:  ? ?Treatments:  ? ?Discharge Exam: ?Blood pressure (!) 141/55, pulse 87, temperature 98.2 ?F (36.8 ?C), temperature source Oral, resp. rate 16, height '5\' 6"'$  (1.676 m), weight 89.4 kg, SpO2 99 %. ? ?Awake and alert.  Oriented and appropriate.  Speech is fluent.  Judgment insight intact.  Cranial nerve function normal bilateral.  Motor examination 5/5 bilaterally.  Gait normal.  Wound healing well.  Chest and abdomen benign. ?Disposition: Discharge disposition: 01-Home or Self Care ? ? ? ? ? ? ? ?Allergies as of 05/18/2021   ? ?   Reactions  ? Methotrexate Derivatives Shortness Of Breath  ? Breathing difficulties   ? Acyclovir And Related Rash  ? Codeine Nausea Only  ? Contrast Media [iodinated Contrast Media] Itching, Rash  ? Inderide [propranolol-hctz] Rash  ? Lodine [etodolac] Rash  ? Metrizamide Itching, Rash  ? Procardia [nifedipine] Rash  ? ?  ? ?  ?Medication List  ?  ? ?TAKE these medications   ? ?acetaminophen 500 MG tablet ?Commonly known as: TYLENOL ?Take 500 mg by mouth every 6 (six) hours as needed (for pain.). ?  ?aspirin EC 81 MG tablet ?Take 81 mg by mouth daily. ?  ?bisoprolol 10 MG tablet ?Commonly known as: ZEBETA ?Take 1 tablet (10 mg total) by mouth daily. ?  ?cetirizine 10 MG tablet ?Commonly known as: ZYRTEC ?Take 10 mg by mouth at bedtime. ?  ?HYDROcodone-acetaminophen 10-325 MG  tablet ?Commonly known as: NORCO ?Take 1 tablet by mouth every 4 (four) hours as needed for moderate pain ((score 4 to 6)). ?  ?inFLIXimab 100 MG injection ?Commonly known as: REMICADE ?Inject 500 mg into the vein every 6 (six) weeks. ?  ?insulin NPH Human 100 UNIT/ML injection ?Commonly known as: NOVOLIN N ?Inject 0.4 mLs (40 Units total) into the skin daily before breakfast. ?What changed: when to take this ?  ?leflunomide 10 MG tablet ?Commonly known as: ARAVA ?Take 10 mg by mouth daily. ?  ?levocetirizine 5 MG tablet ?Commonly known as: XYZAL ?Take 5 mg by mouth at bedtime. ?  ?lisinopril 20 MG tablet ?Commonly known as: ZESTRIL ?Take 20 mg by mouth daily. ?  ?montelukast 10 MG tablet ?Commonly known as: SINGULAIR ?Take 10 mg by mouth daily. ?  ?pantoprazole 40 MG tablet ?Commonly known as: PROTONIX ?Take 40 mg by mouth daily. ?  ?rOPINIRole 0.5 MG tablet ?Commonly known as: REQUIP ?Take 0.5 mg by mouth at bedtime. ?  ?simvastatin 40 MG tablet ?Commonly known as: ZOCOR ?Take 40 mg by mouth at bedtime. ?  ?spironolactone 25 MG tablet ?Commonly known as: ALDACTONE ?Take 25 mg by mouth daily. ?  ?tiZANidine 4 MG tablet ?Commonly known as: ZANAFLEX ?Take 4 mg by mouth 3 (three) times daily as needed for muscle spasms. ?  ?vitamin C 1000 MG tablet ?Take 1,000 mg by mouth daily. ?  ? ?  ? ?  ?  ? ? ?  ?Durable Medical Equipment  ?(From admission, onward)  ?  ? ? ?  ? ?  Start     Ordered  ? 05/17/21 1152  DME Walker rolling  Once       ?Question:  Patient needs a walker to treat with the following condition  Answer:  Lumbar radiculopathy  ? 05/17/21 1151  ? 05/17/21 1152  DME 3 n 1  Once       ? 05/17/21 1151  ? ?  ?  ? ?  ? ? ? ?Signed: ?[name] Render Ladanian Kelter ?05/18/2021, 8:05 AM ? ? ?

## 2021-05-18 NOTE — Progress Notes (Signed)
Patient alert and oriented, mae's well, voiding adequate amount of urine, swallowing without difficulty, no c/o pain at time of discharge. Patient discharged home with family. Script and discharged instructions given to patient. Patient and family stated understanding of instructions given. Patient has an appointment with Dr. Pool in 2 weeks 

## 2021-05-18 NOTE — Discharge Instructions (Signed)

## 2021-05-18 NOTE — Evaluation (Signed)
Occupational Therapy Evaluation ?Patient Details ?Name: Courtney [name] ?MRN: [id number] ?DOB: [birth year] ?Today's Date: 05/18/2021 ? ? ?History of Present Illness 81 y.o. female presenting for L5-S1 PLIF.  PMHx significant for previous spinal surgeries: L3-4 decompression and fusion 12/2018, L4-5 PLIF 2022, HTN, Hx skin cancer, DMII, OA, and OSA, RA  ? ?Clinical Impression ?  ?Patient is s/p L5-s1 PLIF surgery resulting in functional limitations due to the deficits listed below (see OT problem list). Pt demonstrates increased time for sit<>stand and direct use of bil UE to power up from surface. Pt reliant on RW for balance. Pt able to complete ADLS with AE and has AE at home Patient will benefit from skilled OT acutely to increase independence and safety with ADLS to allow discharge home. ?  ?   ? ?Recommendations for follow up therapy are one component of a multi-disciplinary discharge planning process, led by the attending physician.  Recommendations may be updated based on patient status, additional functional criteria and insurance authorization.  ? ?Follow Up Recommendations ? No OT follow up  ?  ?Assistance Recommended at Discharge None  ?Patient can return home with the following A little help with bathing/dressing/bathroom;Direct supervision/assist for medications management;Assist for transportation ? ?  ?Functional Status Assessment ? Patient has had a recent decline in their functional status and demonstrates the ability to make significant improvements in function in a reasonable and predictable amount of time.  ?Equipment Recommendations ? None recommended by OT  ?  ?Recommendations for Other Services   ? ? ?  ?Precautions / Restrictions Precautions ?Precautions: Fall;Back ?Precaution Booklet Issued: Yes (comment) ?Precaution Comments: reviewed handout with back precautions for ADLS ?Required Braces or Orthoses: Spinal Brace ?Spinal Brace: Lumbar corset;Applied in sitting position ?Restrictions ?Weight  Bearing Restrictions: No  ? ?  ? ?Mobility Bed Mobility ?Overal bed mobility: Modified Independent ?  ?  ?  ?  ?  ?  ?General bed mobility comments: exiting R side of the bed ?  ? ?Transfers ?Overall transfer level: Needs assistance ?Equipment used: Rolling walker (2 wheels) ?Transfers: Sit to/from Stand ?Sit to Stand: Supervision ?  ?  ?  ?  ?  ?  ?  ? ?  ?Balance Overall balance assessment: Mild deficits observed, not formally tested ?  ?  ?  ?  ?  ?  ?  ?  ?  ?  ?  ?  ?  ?  ?  ?  ?  ?  ?   ? ?ADL either performed or assessed with clinical judgement  ? ?ADL Overall ADL's : Needs assistance/impaired ?Eating/Feeding: Independent ?  ?Grooming: Wash/dry face;Oral care;Supervision/safety;Standing ?  ?  ?  ?  ?  ?Upper Body Dressing : Modified independent;Sitting ?Upper Body Dressing Details (indicate cue type and reason): cues for bra strap with back incision ?Lower Body Dressing: Min guard;Sit to/from stand;Cueing for back precautions;With adaptive equipment ?Lower Body Dressing Details (indicate cue type and reason): using reacher. pt attempting initially without reacher and states "my husband helps me at home" ?Toilet Transfer: Supervision/safety;BSC/3in1;Ambulation;Rolling walker (2 wheels) ?  ?  ?  ?  ?  ?Functional mobility during ADLs: Rolling walker (2 wheels);Supervision/safety ? Back handout provided and reviewed adls in detail. Pt educated on: clothing between brace, never sleep in brace, set an alarm at night for medication, avoid sitting for long periods of time, correct bed positioning for sleeping, correct sequence for bed mobility, avoiding lifting more than 5 pounds and never wash directly over incision.  All education is complete and patient indicates understanding. ?  ? ? ? ?Vision Baseline Vision/History: 0 No visual deficits ?   ?   ?Perception   ?  ?Praxis   ?  ? ?Pertinent Vitals/Pain Pain Assessment ?Pain Assessment: No/denies pain  ? ? ? ?Hand Dominance Right ?  ?Extremity/Trunk Assessment Upper  Extremity Assessment ?Upper Extremity Assessment: RUE deficits/detail ?RUE Deficits / Details: hx of RA and requires increased time to remove the plastic off the tooth paste from home ?  ?Lower Extremity Assessment ?Lower Extremity Assessment: Defer to PT evaluation ?  ?Cervical / Trunk Assessment ?Cervical / Trunk Assessment: Kyphotic;Back Surgery ?  ?Communication Communication ?Communication: No difficulties ?  ?Cognition Arousal/Alertness: Awake/alert ?Behavior During Therapy: [facility] for tasks assessed/performed ?Overall Cognitive Status: Within Functional Limits for tasks assessed ?  ?  ?  ?  ?  ?  ?  ?  ?  ?  ?  ?  ?  ?  ?  ?  ?  ?  ?  ?General Comments  incision dry and covered with new dressing with RN present on arrival ? ?  ?Exercises   ?  ?Shoulder Instructions    ? ? ?Home Living Family/patient expects to be discharged to:: Private residence ?Living Arrangements: Spouse/significant other ?Available Help at Discharge: Family;Available 24 hours/day ?Type of Home: House ?Home Access: Stairs to enter ?Entrance Stairs-Number of Steps: 2 ?  ?Home Layout: One level ?  ?  ?Bathroom Shower/Tub: Walk-in shower ?  ?Bathroom Toilet: Handicapped height ?Bathroom Accessibility: Yes ?How Accessible: Accessible via walker ?Home Equipment: [name] - single point;Cane - quad;BSC/3in1;Shower seat - built in;Shower seat;Rollator (4 wheels) ?  ?Additional Comments: pt's husband has been assisting her and is very capable per her report. ?  ? ?  ?Prior Functioning/Environment Prior Level of Function : Needs assist ?  ?  ?  ?  ?  ?  ?Mobility Comments: pt does not drive. Has been using RW to ambulate independently. ?  ?  ? ?  ?  ?OT Problem List: Impaired balance (sitting and/or standing);Decreased safety awareness;Decreased knowledge of use of DME or AE;Decreased knowledge of precautions ?  ?   ?OT Treatment/Interventions: Self-care/ADL training;DME and/or AE instruction;Therapeutic activities;Patient/family education;Balance  training  ?  ?OT Goals(Current goals can be found in the care plan section) Acute Rehab OT Goals ?Patient Stated Goal: to go home ?OT Goal Formulation: With patient ?Time For Goal Achievement: 06/01/21 ?Potential to Achieve Goals: Good  ?OT Frequency: Min 2X/week ?  ? ?Co-evaluation   ?  ?  ?  ?  ? ?  ?AM-PAC OT "6 Clicks" Daily Activity     ?Outcome Measure Help from another person eating meals?: None ?Help from another person taking care of personal grooming?: None ?Help from another person toileting, which includes using toliet, bedpan, or urinal?: None ?Help from another person bathing (including washing, rinsing, drying)?: None ?Help from another person to put on and taking off regular upper body clothing?: None ?Help from another person to put on and taking off regular lower body clothing?: None ?6 Click Score: 24 ?  ?End of Session Equipment Utilized During Treatment: Rolling walker (2 wheels);Back brace ?Nurse Communication: Mobility status;Precautions ? ?Activity Tolerance: Patient tolerated treatment well ?Patient left: in chair;with call bell/phone within reach ? ?OT Visit Diagnosis: Unsteadiness on feet (R26.81);Muscle weakness (generalized) (M62.81)  ?              ?Time: 5176-1607 ?OT Time Calculation (min): 23 min ?Charges:  OT General Charges ?$OT Visit: 1 Visit ?OT Evaluation ?$OT Eval Moderate Complexity: 1 Mod ? ? ?Brynn, OTR/L  ?Acute Rehabilitation Services ?Office: [phone] ?. ? ? ?Jeri Modena ?05/18/2021, 10:13 AM ?

## 2021-05-19 NOTE — Progress Notes (Signed)
Patient with diabetes ?

## 2021-09-02 ENCOUNTER — Emergency Department: Payer: Medicare Other

## 2021-09-02 ENCOUNTER — Other Ambulatory Visit: Payer: Self-pay

## 2021-09-02 DIAGNOSIS — Z85828 Personal history of other malignant neoplasm of skin: Secondary | ICD-10-CM | POA: Diagnosis not present

## 2021-09-02 DIAGNOSIS — Z794 Long term (current) use of insulin: Secondary | ICD-10-CM | POA: Insufficient documentation

## 2021-09-02 DIAGNOSIS — R079 Chest pain, unspecified: Secondary | ICD-10-CM | POA: Diagnosis present

## 2021-09-02 DIAGNOSIS — I10 Essential (primary) hypertension: Secondary | ICD-10-CM | POA: Diagnosis not present

## 2021-09-02 DIAGNOSIS — R791 Abnormal coagulation profile: Secondary | ICD-10-CM | POA: Diagnosis not present

## 2021-09-02 DIAGNOSIS — Z79899 Other long term (current) drug therapy: Secondary | ICD-10-CM | POA: Diagnosis not present

## 2021-09-02 DIAGNOSIS — E119 Type 2 diabetes mellitus without complications: Secondary | ICD-10-CM | POA: Diagnosis not present

## 2021-09-02 LAB — CBC
HCT: 39.7 % (ref 36.0–46.0)
Hemoglobin: 12.2 g/dL (ref 12.0–15.0)
MCH: 26 pg (ref 26.0–34.0)
MCHC: 30.7 g/dL (ref 30.0–36.0)
MCV: 84.5 fL (ref 80.0–100.0)
Platelets: 155 10*3/uL (ref 150–400)
RBC: 4.7 MIL/uL (ref 3.87–5.11)
RDW: 13.6 % (ref 11.5–15.5)
WBC: 10.4 10*3/uL (ref 4.0–10.5)
nRBC: 0 % (ref 0.0–0.2)

## 2021-09-02 LAB — BASIC METABOLIC PANEL
Anion gap: 8 (ref 5–15)
BUN: 23 mg/dL (ref 8–23)
CO2: 24 mmol/L (ref 22–32)
Calcium: 9.6 mg/dL (ref 8.9–10.3)
Chloride: 108 mmol/L (ref 98–111)
Creatinine, Ser: 0.81 mg/dL (ref 0.44–1.00)
GFR, Estimated: >60 mL/min (ref >60)
Glucose, Bld: 78 mg/dL (ref 70–99)
Potassium: 4.1 mmol/L (ref 3.5–5.1)
Sodium: 140 mmol/L (ref 135–145)

## 2021-09-02 LAB — TROPONIN I (HIGH SENSITIVITY)
Troponin I (High Sensitivity): 7 ng/L (ref <18)
Troponin I (High Sensitivity): 7 ng/L (ref <18)

## 2021-09-02 LAB — CBG MONITORING, ED
Glucose-Capillary: 64 mg/dL — ABNORMAL LOW (ref 70–99)
Glucose-Capillary: 122 mg/dL — ABNORMAL HIGH (ref 70–99)

## 2021-09-02 NOTE — ED Triage Notes (Signed)
Pt to ED with family member, POV, went to [facility] and told to come here for eval for R side CP since 1000 today which started after drinking coffee this morning. CP is R side to R shoulder blade, burning in quality. Pt states has had similar CP before and takes TUMS which helps usually. Pt states chest hurts with deep breath.  Pt also complains of hypertension, worse since May after back surgery. Pt has bilateral pedal edema, states is normal for her due to RA flares.

## 2021-09-02 NOTE — ED Notes (Signed)
CBG checked per pt request, is 64. Orange juice given.

## 2021-09-03 ENCOUNTER — Emergency Department
Admission: EM | Admit: 2021-09-03 | Discharge: 2021-09-03 | Disposition: A | Discharge status: Home / Self Care | Payer: Medicare Other | Attending: Emergency Medicine | Admitting: Emergency Medicine

## 2021-09-03 ENCOUNTER — Emergency Department: Payer: Medicare Other

## 2021-09-03 DIAGNOSIS — R079 Chest pain, unspecified: Secondary | ICD-10-CM

## 2021-09-03 LAB — HEPATIC FUNCTION PANEL
ALT: 16 U/L (ref 0–44)
AST: 25 U/L (ref 15–41)
Albumin: 3.3 g/dL — ABNORMAL LOW (ref 3.5–5.0)
Alkaline Phosphatase: 109 U/L (ref 38–126)
Bilirubin, Direct: <0.1 mg/dL (ref 0.0–0.2)
Total Bilirubin: 0.8 mg/dL (ref 0.3–1.2)
Total Protein: 6.8 g/dL (ref 6.5–8.1)

## 2021-09-03 LAB — LIPASE, BLOOD: Lipase: 27 U/L (ref 11–51)

## 2021-09-03 LAB — CBG MONITORING, ED: Glucose-Capillary: 89 mg/dL (ref 70–99)

## 2021-09-03 LAB — D-DIMER, QUANTITATIVE: D-Dimer, Quant: 1.58 ug/mL-FEU — ABNORMAL HIGH (ref 0.00–0.50)

## 2021-09-03 MED ORDER — ALUM & MAG HYDROXIDE-SIMETH 200-200-20 MG/5ML PO SUSP
30.0000 mL | Freq: Once | ORAL | Status: AC
  Administered 2021-09-03: 30 mL via ORAL
  Filled 2021-09-03: qty 30

## 2021-09-03 MED ORDER — LIDOCAINE VISCOUS HCL 2 % MT SOLN
15.0000 mL | Freq: Once | OROMUCOSAL | Status: AC
  Administered 2021-09-03: 15 mL via OROMUCOSAL
  Filled 2021-09-03: qty 15

## 2021-09-03 MED ORDER — IOHEXOL 350 MG/ML SOLN
100.0000 mL | Freq: Once | INTRAVENOUS | Status: AC | PRN
  Administered 2021-09-03: 100 mL via INTRAVENOUS

## 2021-09-03 MED ORDER — DIPHENHYDRAMINE HCL 25 MG PO CAPS
50.0000 mg | ORAL_CAPSULE | Freq: Once | ORAL | Status: AC
  Administered 2021-09-03: 50 mg via ORAL
  Filled 2021-09-03: qty 2

## 2021-09-03 MED ORDER — SODIUM CHLORIDE 0.9 % IV BOLUS
500.0000 mL | Freq: Once | INTRAVENOUS | Status: AC
  Administered 2021-09-03: 500 mL via INTRAVENOUS

## 2021-09-03 MED ORDER — DIPHENHYDRAMINE HCL 50 MG/ML IJ SOLN: 50.0000 mg | Freq: Once | INTRAMUSCULAR | Status: AC

## 2021-09-03 MED ORDER — METHYLPREDNISOLONE SODIUM SUCC 40 MG IJ SOLR
40.0000 mg | Freq: Once | INTRAMUSCULAR | Status: AC
  Administered 2021-09-03: 40 mg via INTRAVENOUS
  Filled 2021-09-03: qty 1

## 2021-09-03 NOTE — ED Notes (Signed)
Report received, this RN now assuming care.  

## 2021-09-03 NOTE — ED Notes (Signed)
Pt had a concern for blood sugar being low and requested to check her blood sugar.

## 2021-09-03 NOTE — Discharge Instructions (Addendum)
Take your medicines daily as directed by your doctor.  Return to the ER for worsening symptoms, persistent vomiting, difficulty breathing or other concerns. 

## 2021-09-03 NOTE — ED Provider Notes (Addendum)
[facility] Provider Note    Event Date/Time   First MD Initiated Contact with Patient 09/03/21 0225     (approximate)   History   Chest Pain   HPI  Courtney [name] is a 81 y.o. female referred to the ED from urgent care for further evaluation of chest pain.  Patient with a past medical history of diabetes, hypertension, rheumatoid arthritis who reports right-sided sharp and burning chest pain since 10 AM after eating toast, eggs and drinking 2 cups of coffee.  Reports right-sided pain radiating to her shoulder blade, constant and burning in quality.  Similar symptoms previously usually relieved by Tums but this time did not.  Exacerbated with deep inspiration.  Also notes elevated blood pressure and bilateral leg swelling since May of this year.  States her doctor cut her dosage of furosemide to half due to RA flare.  Denies associated diaphoresis, shortness of breath, palpitations, nausea/vomiting or dizziness.     Past Medical History   Past Medical History:  Diagnosis Date  . Arthritis    rheumatoid arthritis  . B12 deficiency   . Cancer ([place])    skin cancer (squamous cell)  . Collagen vascular disease (HCC)    RA  . Dermatitis   . Diabetes mellitus without complication ([place])    type 2  . Eczema   . 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 GERD (gastroesophageal reflux disease)   . Headache    migraines in the past (none since menopause)  . History of hiatal hernia   . Hives    "chronic"  . Hypertension   . Loose stools   . Osteoarthritis    lumbar spine, cervical spine, plantar fascitis   . Pneumonia   . PONV (postoperative nausea and vomiting)   . Psoriasis   . Rosacea   . Sleep apnea 2004   does not use cpap  . Urethral stenosis    w/ bladder polyps. followed by Dr Madelin Headings   . Urticaria    chronic   . Varicose veins      Active Problem List   Patient Active Problem List   Diagnosis Date Noted  . Lumbar  radiculopathy 05/17/2021  . Degenerative spondylolisthesis 06/25/2020  . Cervical radiculopathy 08/20/2017  . AKI (acute kidney injury) ([place])   . Surgery, elective   . History of lumbar surgery   . Post-operative pain   . Thrombocytopenia ([place])   . Type 2 diabetes mellitus without complication, without long-term current use of insulin ([place])   . Lumbar spondylosis 02/02/2015  . 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 Status post lumbar surgery 09/29/2014  . DDD (degenerative disc disease), lumbar 07/02/2014  . Facet syndrome, lumbar 07/02/2014  . Sacroiliac joint dysfunction 07/02/2014  . Lumbar degenerative disc disease 12/24/2013     Past Surgical History   Past Surgical History:  Procedure Laterality Date  . ANTERIOR CERVICAL DECOMP/DISCECTOMY FUSION N/A 08/20/2017   Procedure: Anterior Cervical Decompression Fusion - Cervical Four - Cervical Five - Cervical Five - Cervical Six;  Surgeon: Earnie Larsson, MD;  Location: [place];  Service: Neurosurgery;  Laterality: N/A;  Anterior Cervical Decompression Fusion - Cervical Four - Cervical Five - Cervical Five - Cervical Six  . APPENDECTOMY    . BILATERAL CARPAL TUNNEL RELEASE    . BREAST CYST ASPIRATION Left    neg  . CATARACT EXTRACTION Bilateral   . CHOLECYSTECTOMY  1963  . COLONOSCOPY WITH PROPOFOL  N/A 12/31/2015   Procedure: COLONOSCOPY WITH PROPOFOL;  Surgeon: Lollie Sails, MD;  Location: [facility] ENDOSCOPY;  Service: Endoscopy;  Laterality: N/A;  . ENDOMETRIAL ABLATION  1991  . ESOPHAGOGASTRODUODENOSCOPY (EGD) WITH PROPOFOL N/A 12/31/2015   Procedure: ESOPHAGOGASTRODUODENOSCOPY (EGD) WITH PROPOFOL;  Surgeon: Lollie Sails, MD;  Location: [facility] ENDOSCOPY;  Service: Endoscopy;  Laterality: N/A;  . ESOPHAGOGASTRODUODENOSCOPY (EGD) WITH PROPOFOL N/A 06/02/2016   Procedure: ESOPHAGOGASTRODUODENOSCOPY (EGD) WITH PROPOFOL;  Surgeon: Lollie Sails, MD;  Location: [facility] ENDOSCOPY;  Service:  Endoscopy;  Laterality: N/A;  . ESOPHAGOGASTRODUODENOSCOPY (EGD) WITH PROPOFOL N/A 11/07/2016   Procedure: ESOPHAGOGASTRODUODENOSCOPY (EGD) WITH PROPOFOL;  Surgeon: Lollie Sails, MD;  Location: [facility] ENDOSCOPY;  Service: Endoscopy;  Laterality: N/A;  . EYE SURGERY Bilateral 2006 and 2012   cataract surgery with lens implant  . eyelid surgery Bilateral 2014  . FOOT SURGERY Right    ligament and spurs  . LUMBAR LAMINECTOMY/DECOMPRESSION MICRODISCECTOMY Right 12/24/2013   Procedure: RIGHT LUMBAR THREE TO FOUR, LUMBAR FOUR TO FIVE, LUMBAR FIVE TO SACRAL ONE LAMINECTOMY/FORAMINOTOMY;  Surgeon: Floyce Stakes, MD;  Location: [facility] NEURO ORS;  Service: Neurosurgery;  Laterality: Right;  RIGHT L3-4 L4-5 L5-S1 LAMINECTOMY/FORAMINOTOMY  . NASAL SEPTUM SURGERY  2004  . SHOULDER SURGERY Right    for a frozen shoulder  . SKIN CANCER EXCISION Right    leg x 4  . skin cancer removal    . TONSILLECTOMY    . TUBAL LIGATION  1968     Home Medications   Prior to Admission medications   Medication Sig Start Date End Date Taking? Authorizing Provider  acetaminophen (TYLENOL) 500 MG tablet Take 500 mg by mouth every 6 (six) hours as needed (for pain.).     [provider]  Ascorbic Acid (VITAMIN C) 1000 MG tablet Take 1,000 mg by mouth daily.    [provider]  aspirin EC 81 MG tablet Take 81 mg by mouth daily.    [provider]  bisoprolol (ZEBETA) 10 MG tablet Take 1 tablet (10 mg total) by mouth daily. 02/07/15   Ditty, Kevan Ny, MD  cetirizine (ZYRTEC) 10 MG tablet Take 10 mg by mouth at bedtime.     [provider]  HYDROcodone-acetaminophen (NORCO) 10-325 MG tablet Take 1 tablet by mouth every 4 (four) hours as needed for moderate pain ((score 4 to 6)). 05/18/21   Earnie Larsson, MD  inFLIXimab (REMICADE) 100 MG injection Inject 500 mg into the vein every 6 (six) weeks.    [provider]  insulin NPH Human (HUMULIN N,NOVOLIN N) 100 UNIT/ML  injection Inject 0.4 mLs (40 Units total) into the skin daily before breakfast. Patient taking differently: Inject 40 Units into the skin in the morning and at bedtime. 02/07/15   Ditty, Kevan Ny, MD  leflunomide (ARAVA) 10 MG tablet Take 10 mg by mouth daily.    [provider]  levocetirizine (XYZAL) 5 MG tablet Take 5 mg by mouth at bedtime.    [provider]  lisinopril (PRINIVIL,ZESTRIL) 20 MG tablet Take 20 mg by mouth daily.    [provider]  montelukast (SINGULAIR) 10 MG tablet Take 10 mg by mouth daily.    [provider]  pantoprazole (PROTONIX) 40 MG tablet Take 40 mg by mouth daily.    [provider]  rOPINIRole (REQUIP) 0.5 MG tablet Take 0.5 mg by mouth at bedtime. 02/21/21   [provider]  simvastatin (ZOCOR) 40 MG tablet Take 40 mg by mouth  at bedtime.    [provider]  spironolactone (ALDACTONE) 25 MG tablet Take 25 mg by mouth daily.    [provider]  tiZANidine (ZANAFLEX) 4 MG tablet Take 4 mg by mouth 3 (three) times daily as needed for muscle spasms. 01/07/21   [provider]     Allergies  Methotrexate derivatives, Acyclovir and related, Codeine, Contrast media [iodinated contrast media], Inderide [propranolol-hctz], Lodine [etodolac], Metrizamide, and Procardia [nifedipine]   Family History   Family History  Problem Relation Age of Onset  . COPD Mother   . Arthritis/Rheumatoid Mother   . Alzheimer's disease Father   . Heart attack Father   . Breast cancer Paternal Aunt 39     Physical Exam  Triage Vital Signs: ED Triage Vitals  Enc Vitals Group     BP 09/02/21 1849 (!) 188/97     Pulse Rate 09/02/21 1849 89     Resp 09/02/21 1849 18     Temp 09/02/21 1849 98.6 F (37 C)     Temp Source 09/02/21 1849 Oral     SpO2 09/02/21 1849 98 %     Weight 09/02/21 1854 195 lb (88.5 kg)     Height 09/02/21 1854 '5\' 8"'$  (1.727 m)     Head Circumference --      Peak Flow --       Pain Score 09/02/21 1852 6     Pain Loc --      Pain Edu? --      Excl. in [place]? --     Updated Vital Signs: BP 137/62   Pulse 71   Temp 98.1 F (36.7 C) (Oral)   Resp (!) 22   Ht '5\' 8"'$  (1.727 m)   Wt 88.5 kg   SpO2 96%   BMI 29.65 kg/m    General: Awake, no distress.  CV:  RRR.  Good peripheral perfusion.  Resp:  Normal effort.  CTA B. Abd:  Mild tenderness to palpation epigastrium without rebound or guarding.  No distention.  Other:     ED Results / Procedures / Treatments  Labs (all labs ordered are listed, but only abnormal results are displayed) Labs Reviewed  HEPATIC FUNCTION PANEL - Abnormal; Notable for the following components:      Result Value   Albumin 3.3 (*)    All other components within normal limits  D-DIMER, QUANTITATIVE - Abnormal; Notable for the following components:   D-Dimer, Quant 1.58 (*)    All other components within normal limits  CBG MONITORING, ED - Abnormal; Notable for the following components:   Glucose-Capillary 64 (*)    All other components within normal limits  CBG MONITORING, ED - Abnormal; Notable for the following components:   Glucose-Capillary 122 (*)    All other components within normal limits  BASIC METABOLIC PANEL  CBC  LIPASE, BLOOD  CBG MONITORING, ED  TROPONIN I (HIGH SENSITIVITY)  TROPONIN I (HIGH SENSITIVITY)     EKG  ED ECG REPORT I, Offie Pickron J, the attending physician, personally viewed and interpreted this ECG.   Date: 09/03/2021  EKG Time: 1851  Rate: 91  Rhythm: normal sinus rhythm  Axis: Normal  Intervals:first-degree A-V block   ST&T Change: Nonspecific    RADIOLOGY I have independently visualized and interpreted patient's chest x-ray as well as noted the radiology interpretation:  Chest x-ray: No acute cardiopulmonary process  CTA chest: Pending  Official radiology report(s): DG Chest 2 View  Result Date: 09/02/2021 CLINICAL DATA:  Chest pain. EXAM: CHEST - 2 VIEW COMPARISON:   CT of the chest 01/12/2016 FINDINGS: The heart size and mediastinal contours are within normal limits. Both lungs are clear. The visualized skeletal structures are unremarkable. IMPRESSION: No active cardiopulmonary disease. Electronically Signed   By: Ronney Asters M.D.   On: 09/02/2021 19:14     PROCEDURES:  Critical Care performed: No  .1-3 Lead EKG Interpretation  Performed by: Paulette Blanch, MD Authorized by: Paulette Blanch, MD     Interpretation: normal     ECG rate:  90   ECG rate assessment: normal     Rhythm: sinus rhythm     Ectopy: none     Conduction: normal   Comments:     Patient placed on cardiac monitor to evaluate for arrhythmias    MEDICATIONS ORDERED IN ED: Medications  alum & mag hydroxide-simeth (MAALOX/MYLANTA) 200-200-20 MG/5ML suspension 30 mL (30 mLs Oral Given 09/03/21 0248)  lidocaine (XYLOCAINE) 2 % viscous mouth solution 15 mL (15 mLs Mouth/Throat Given 09/03/21 0248)  sodium chloride 0.9 % bolus 500 mL (0 mLs Intravenous Stopped 09/03/21 0434)  methylPREDNISolone sodium succinate (SOLU-MEDROL) 40 mg/mL injection 40 mg (40 mg Intravenous Given 09/03/21 0351)  diphenhydrAMINE (BENADRYL) capsule 50 mg (50 mg Oral Given 09/03/21 0659)    Or  diphenhydrAMINE (BENADRYL) injection 50 mg ( Intravenous See Alternative 09/03/21 0659)     IMPRESSION / MDM / [place] / ED COURSE  I reviewed the triage vital signs and the nursing notes.                             81 year old female presenting with right-sided chest pain. Differential diagnosis includes, but is not limited to, ACS, aortic dissection, pulmonary embolism, cardiac tamponade, pneumothorax, pneumonia, pericarditis, myocarditis, GI-related causes including esophagitis/gastritis, and musculoskeletal chest wall pain.   I have personally reviewed patient's records and note her [facility] clinic office visit from yesterday.  Patient's presentation is most consistent with acute presentation with  potential threat to life or bodily function.  The patient is on the cardiac monitor to evaluate for evidence of arrhythmia and/or significant heart rate changes.  Laboratory results demonstrate normal WBC 10.4, normal electrolytes including LFTs/lipase, 2 sets of negative troponins.  Chest x-ray is clear.  Will add D-dimer, administer GI cocktail and reassess.  Clinical Course as of 09/03/21 5035  Sat Sep 03, 2021  0341 D-dimer is elevated; will proceed with CTA chest to evaluate for PE.  Patient has contrast dye allergy; have ordered premedication protocol.  Updated patient and daughter who understand and are agreeable with plan of care. [JS]  [id number] Patient asleep.  Care transferred to the oncoming provider Dr. Cherylann Banas at change of shift.  If CTA is negative for PE, anticipate patient may be discharged home with close cardiology follow-up. [JS]    Clinical Course User Index [JS] Paulette Blanch, MD     FINAL CLINICAL IMPRESSION(S) / ED DIAGNOSES   Final diagnoses:  Chest pain, unspecified type     Rx / DC Orders   ED Discharge Orders     None        Note:  This document was prepared using Dragon voice recognition software and may include unintentional dictation errors.   Paulette Blanch, MD 09/03/21 4656    Paulette Blanch, MD 09/03/21 [phone]

## 2021-09-03 NOTE — ED Notes (Signed)
Patient up to restroom.

## 2021-09-03 NOTE — ED Notes (Signed)
Pt presents to ED with c/o chest pain. Pt states she had centralized chest pain that started around 10 am. Pt went to urgent care and was sent here for further eval. Pt states her chest pain feels like ingestion. Pt c/o pain in chest when taking a deep breath. Pt denies any significant cardiac hx. Pt talking in complete sentences at this time.

## 2021-09-03 NOTE — ED Provider Notes (Signed)
-----------------------------------------   8:34 AM on 09/03/2021 -----------------------------------------   I took over care on this patient from Dr. Beather Arbour.  CTA is negative for PE.  There is varied attenuation concerning for air trapping or edema, however the patient denies respiratory symptoms, is not hypoxic, and has no other findings concerning for acute pulmonary process.  She is stable for discharge home.  On reassessment she appears well and states she is comfortable.  I counseled her on the results of the workup.  Return precautions provided and the patient expresses understanding.    Arta Silence, MD 09/03/21 [phone]

## 2021-10-24 DIAGNOSIS — M7552 Bursitis of left shoulder: Secondary | ICD-10-CM | POA: Insufficient documentation

## 2021-11-14 DIAGNOSIS — E785 Hyperlipidemia, unspecified: Secondary | ICD-10-CM | POA: Insufficient documentation

## 2021-11-14 DIAGNOSIS — I10 Essential (primary) hypertension: Secondary | ICD-10-CM | POA: Insufficient documentation

## 2021-11-14 DIAGNOSIS — I739 Peripheral vascular disease, unspecified: Secondary | ICD-10-CM | POA: Insufficient documentation

## 2021-11-14 NOTE — Progress Notes (Deleted)
MRN : [id number]  Courtney [name] is a 81 y.o. (01-Dec-1940) female who presents with chief complaint of check circulation.  History of Present Illness:    The patient is seen for evaluation of painful lower extremities and diminished pulses. Patient notes the pain is always associated with activity and is very consistent day today. Typically, the pain occurs at less than one block, progress is as activity continues to the point that the patient must stop walking. Resting including standing still for several minutes allows the patient to walk a similar distance before being forced to stop again. Uneven terrain and inclines shorten the distance. The pain has been progressive over the past several years. The patient denies any abrupt changes in claudication symptoms.  The patient states the inability to walk is causing problems with daily activities.  The patient denies rest pain or dangling of an extremity off the side of the bed during the night for relief. No open wounds or sores at this time. No prior interventions or surgeries.  No history of back problems or DJD of the lumbar sacral spine.   The patient's blood pressure has been stable and relatively well controlled. The patient denies amaurosis fugax or recent TIA symptoms. There are no recent neurological changes noted. The patient denies history of DVT, PE or superficial thrombophlebitis. The patient denies recent episodes of angina or shortness of breath.   No outpatient medications have been marked as taking for the 11/17/21 encounter (Appointment) with Delana Meyer, Dolores Lory, MD.    Past Medical History:  Diagnosis Date   Arthritis    rheumatoid arthritis   B12 deficiency    Cancer ([place])    skin cancer (squamous cell)   Collagen vascular disease ([place])    RA   Dermatitis    Diabetes mellitus without complication ([place])    type 2   Eczema    Fibrosis, pulmonary, interstitial, diffuse (HCC)    GERD  (gastroesophageal reflux disease)    Headache    migraines in the past (none since menopause)   History of hiatal hernia    Hives    "chronic"   Hypertension    Loose stools    Osteoarthritis    lumbar spine, cervical spine, plantar fascitis    Pneumonia    PONV (postoperative nausea and vomiting)    Psoriasis    Rosacea    Sleep apnea 2004   does not use cpap   Urethral stenosis    w/ bladder polyps. followed by Dr Madelin Headings    Urticaria    chronic    Varicose veins     Past Surgical History:  Procedure Laterality Date   ANTERIOR CERVICAL DECOMP/DISCECTOMY FUSION N/A 08/20/2017   Procedure: Anterior Cervical Decompression Fusion - Cervical Four - Cervical Five - Cervical Five - Cervical Six;  Surgeon: Earnie Larsson, MD;  Location: [place];  Service: Neurosurgery;  Laterality: N/A;  Anterior Cervical Decompression Fusion - Cervical Four - Cervical Five - Cervical Five - Cervical Six   APPENDECTOMY     BILATERAL CARPAL TUNNEL RELEASE     BREAST CYST ASPIRATION Left    neg   CATARACT EXTRACTION Bilateral    CHOLECYSTECTOMY  1963   COLONOSCOPY WITH PROPOFOL N/A 12/31/2015   Procedure: COLONOSCOPY WITH PROPOFOL;  Surgeon: Lollie Sails, MD;  Location: [facility] ENDOSCOPY;  Service: Endoscopy;  Laterality: N/A;   [place]  ESOPHAGOGASTRODUODENOSCOPY (EGD) WITH PROPOFOL N/A 12/31/2015   Procedure: ESOPHAGOGASTRODUODENOSCOPY (EGD) WITH PROPOFOL;  Surgeon: Lollie Sails, MD;  Location: [facility] ENDOSCOPY;  Service: Endoscopy;  Laterality: N/A;   ESOPHAGOGASTRODUODENOSCOPY (EGD) WITH PROPOFOL N/A 06/02/2016   Procedure: ESOPHAGOGASTRODUODENOSCOPY (EGD) WITH PROPOFOL;  Surgeon: Lollie Sails, MD;  Location: [facility] ENDOSCOPY;  Service: Endoscopy;  Laterality: N/A;   ESOPHAGOGASTRODUODENOSCOPY (EGD) WITH PROPOFOL N/A 11/07/2016   Procedure: ESOPHAGOGASTRODUODENOSCOPY (EGD) WITH PROPOFOL;  Surgeon: Lollie Sails, MD;  Location: [facility] ENDOSCOPY;  Service: Endoscopy;   Laterality: N/A;   EYE SURGERY Bilateral 2006 and 2012   cataract surgery with lens implant   eyelid surgery Bilateral 2014   FOOT SURGERY Right    ligament and spurs   LUMBAR LAMINECTOMY/DECOMPRESSION MICRODISCECTOMY Right 12/24/2013   Procedure: RIGHT LUMBAR THREE TO FOUR, LUMBAR FOUR TO FIVE, LUMBAR FIVE TO SACRAL ONE LAMINECTOMY/FORAMINOTOMY;  Surgeon: Floyce Stakes, MD;  Location: [place] NEURO ORS;  Service: Neurosurgery;  Laterality: Right;  RIGHT L3-4 L4-5 L5-S1 LAMINECTOMY/FORAMINOTOMY   NASAL SEPTUM SURGERY  2004   SHOULDER SURGERY Right    for a frozen shoulder   SKIN CANCER EXCISION Right    leg x 4   skin cancer removal     TONSILLECTOMY     TUBAL LIGATION  1968    Social History Social History   Tobacco Use   Smoking status: Never   Smokeless tobacco: Never  Vaping Use   Vaping Use: Never used  Substance Use Topics   Alcohol use: No   Drug use: No    Family History Family History  Problem Relation Age of Onset   COPD Mother    Arthritis/Rheumatoid Mother    Alzheimer's disease Father    Heart attack Father    Breast cancer Paternal Aunt 54    Allergies  Allergen Reactions   Methotrexate Derivatives Shortness Of Breath    Breathing difficulties    Acyclovir And Related Rash   Codeine Nausea Only   Contrast Media [Iodinated Contrast Media] Itching and Rash   Inderide [Propranolol-Hctz] Rash   Lodine [Etodolac] Rash   Metrizamide Itching and Rash   Procardia [Nifedipine] Rash     REVIEW OF SYSTEMS (Negative unless checked)  Constitutional: '[]'$ Weight loss  '[]'$ Fever  '[]'$ Chills Cardiac: '[]'$ Chest pain   '[]'$ Chest pressure   '[]'$ Palpitations   '[]'$ Shortness of breath when laying flat   '[]'$ Shortness of breath with exertion. Vascular:  '[x]'$ Pain in legs with walking   '[]'$ Pain in legs at rest  '[]'$ History of DVT   '[]'$ Phlebitis   '[]'$ Swelling in legs   '[]'$ Varicose veins   '[]'$ Non-healing ulcers Pulmonary:   '[]'$ Uses home oxygen   '[]'$ Productive cough   '[]'$ Hemoptysis   '[]'$ Wheeze   '[]'$ COPD   '[]'$ Asthma Neurologic:  '[]'$ Dizziness   '[]'$ Seizures   '[]'$ History of stroke   '[]'$ History of TIA  '[]'$ Aphasia   '[]'$ Vissual changes   '[]'$ Weakness or numbness in arm   '[]'$ Weakness or numbness in leg Musculoskeletal:   '[]'$ Joint swelling   '[]'$ Joint pain   '[]'$ Low back pain Hematologic:  '[]'$ Easy bruising  '[]'$ Easy bleeding   '[]'$ Hypercoagulable state   '[]'$ Anemic Gastrointestinal:  '[]'$ Diarrhea   '[]'$ Vomiting  '[]'$ Gastroesophageal reflux/heartburn   '[]'$ Difficulty swallowing. Genitourinary:  '[]'$ Chronic kidney disease   '[]'$ Difficult urination  '[]'$ Frequent urination   '[]'$ Blood in urine Skin:  '[]'$ Rashes   '[]'$ Ulcers  Psychological:  '[]'$ History of anxiety   '[]'$  History of major depression.  Physical Examination  There were no vitals filed for this visit. There is no height or weight on  file to calculate BMI. Gen: WD/WN, NAD Head: [place]/AT, No temporalis wasting.  Ear/Nose/Throat: Hearing grossly intact, nares w/o erythema or drainage Eyes: PER, EOMI, sclera nonicteric.  Neck: Supple, no masses.  No bruit or JVD.  Pulmonary:  Good air movement, no audible wheezing, no use of accessory muscles.  Cardiac: RRR, normal S1, S2, no Murmurs. Vascular:  mild trophic changes, no open wounds Vessel Right Left  Radial Palpable Palpable  PT Not Palpable Not Palpable  DP Not Palpable Not Palpable  Gastrointestinal: soft, non-distended. No guarding/no peritoneal signs.  Musculoskeletal: M/S 5/5 throughout.  No visible deformity.  Neurologic: CN 2-12 intact. Pain and light touch intact in extremities.  Symmetrical.  Speech is fluent. Motor exam as listed above. Psychiatric: Judgment intact, Mood & affect appropriate for pt's clinical situation. Dermatologic: No rashes or ulcers noted.  No changes consistent with cellulitis.   CBC Lab Results  Component Value Date   WBC 10.4 09/02/2021   HGB 12.2 09/02/2021   HCT 39.7 09/02/2021   MCV 84.5 09/02/2021   PLT 155 09/02/2021    BMET    Component Value Date/Time   NA 140 09/02/2021 1857    K 4.1 09/02/2021 1857   CL 108 09/02/2021 1857   CO2 24 09/02/2021 1857   GLUCOSE 78 09/02/2021 1857   BUN 23 09/02/2021 1857   CREATININE 0.81 09/02/2021 1857   CALCIUM 9.6 09/02/2021 1857   GFRNONAA >60 09/02/2021 1857   GFRAA >60 08/13/2017 1430   CrCl cannot be calculated (Patient's most recent lab result is older than the maximum 21 days allowed.).  COAG Lab Results  Component Value Date   INR 1.22 12/25/2014    Radiology No results found.   Assessment/Plan There are no diagnoses linked to this encounter.   Hortencia Pilar, MD  11/14/2021 1:20 PM

## 2021-11-17 ENCOUNTER — Encounter (INDEPENDENT_AMBULATORY_CARE_PROVIDER_SITE_OTHER): Payer: Self-pay

## 2021-11-17 ENCOUNTER — Encounter (INDEPENDENT_AMBULATORY_CARE_PROVIDER_SITE_OTHER): Payer: Self-pay | Admitting: Vascular Surgery

## 2021-11-17 DIAGNOSIS — I739 Peripheral vascular disease, unspecified: Secondary | ICD-10-CM

## 2021-11-17 DIAGNOSIS — E119 Type 2 diabetes mellitus without complications: Secondary | ICD-10-CM

## 2021-11-17 DIAGNOSIS — M5136 Other intervertebral disc degeneration, lumbar region: Secondary | ICD-10-CM

## 2021-11-17 DIAGNOSIS — I10 Essential (primary) hypertension: Secondary | ICD-10-CM

## 2021-11-17 DIAGNOSIS — E782 Mixed hyperlipidemia: Secondary | ICD-10-CM

## 2021-12-08 ENCOUNTER — Other Ambulatory Visit (INDEPENDENT_AMBULATORY_CARE_PROVIDER_SITE_OTHER): Payer: Self-pay | Admitting: Nurse Practitioner

## 2021-12-08 DIAGNOSIS — I739 Peripheral vascular disease, unspecified: Secondary | ICD-10-CM

## 2021-12-09 ENCOUNTER — Other Ambulatory Visit (INDEPENDENT_AMBULATORY_CARE_PROVIDER_SITE_OTHER): Payer: Self-pay | Admitting: Nurse Practitioner

## 2021-12-09 ENCOUNTER — Ambulatory Visit (INDEPENDENT_AMBULATORY_CARE_PROVIDER_SITE_OTHER): Payer: Medicare Other | Admitting: Nurse Practitioner

## 2021-12-09 ENCOUNTER — Ambulatory Visit (INDEPENDENT_AMBULATORY_CARE_PROVIDER_SITE_OTHER): Payer: Medicare Other

## 2021-12-09 VITALS — BP 164/80 | HR 80 | Resp 16 | Wt 204.0 lb

## 2021-12-09 DIAGNOSIS — I739 Peripheral vascular disease, unspecified: Secondary | ICD-10-CM | POA: Diagnosis not present

## 2021-12-09 DIAGNOSIS — I10 Essential (primary) hypertension: Secondary | ICD-10-CM | POA: Diagnosis not present

## 2021-12-09 DIAGNOSIS — E119 Type 2 diabetes mellitus without complications: Secondary | ICD-10-CM | POA: Diagnosis not present

## 2021-12-09 MED ORDER — SULFAMETHOXAZOLE-TRIMETHOPRIM 800-160 MG PO TABS: 1.0000 | ORAL_TABLET | Freq: Two times a day (BID) | ORAL | 0 refills | Status: DC

## 2021-12-14 LAB — AEROBIC CULTURE

## 2021-12-14 LAB — SPECIMEN STATUS REPORT

## 2021-12-15 ENCOUNTER — Other Ambulatory Visit (INDEPENDENT_AMBULATORY_CARE_PROVIDER_SITE_OTHER): Payer: Self-pay | Admitting: Nurse Practitioner

## 2021-12-15 MED ORDER — CIPROFLOXACIN HCL 250 MG PO TABS: 750.0000 mg | ORAL_TABLET | Freq: Two times a day (BID) | ORAL | 0 refills | Status: AC

## 2021-12-15 NOTE — Progress Notes (Signed)
Can you call and let her know that I sent in CIPRO based on her cultures.  She should stop the Bactrim.  Also, she should avoid taking her Zanaflex (the muscle relaxer) while on cipro

## 2021-12-15 NOTE — Progress Notes (Signed)
Patient made aware with medical recommendations and verbalized understanding 

## 2021-12-19 ENCOUNTER — Encounter (INDEPENDENT_AMBULATORY_CARE_PROVIDER_SITE_OTHER): Payer: Self-pay | Admitting: Nurse Practitioner

## 2021-12-19 NOTE — Progress Notes (Signed)
Subjective:    Patient ID: Courtney [name], female    DOB: [birth year], 81 y.o.   MRN: [id number] Chief Complaint  Patient presents with   New Patient (Initial Visit)    Ref Luana Shu consult PVD    Courtney [name] is an 81 year old female who presents today as a referral from Dr. Luana Shu regarding having seen vascular calcifications on a foot x-ray.  She was initially referred to Dr. Luana Shu due to bilateral foot pain.  The patient also has a wound on left shin area that was caused by traumatic incident.  The wound has healed drastically however the last few months the wound has been delayed healing.  Despite this she also does not have any claudication or rest pain like symptoms.  The patient has noncompressible ABIs bilaterally.  There is a TBI 0.63 on the right and 0.54 on the left.  There are monophasic tibial artery waveforms on the right with monophasic on the left.    Review of Systems  Skin:  Positive for wound.  All other systems reviewed and are negative.      Objective:   Physical Exam Vitals reviewed.  HENT:     Head: Normocephalic.  Cardiovascular:     Rate and Rhythm: Normal rate.     Pulses:          Dorsalis pedis pulses are detected w/ Doppler on the right side and detected w/ Doppler on the left side.       Posterior tibial pulses are detected w/ Doppler on the right side and detected w/ Doppler on the left side.  Pulmonary:     Effort: Pulmonary effort is normal.  Skin:    General: Skin is warm and dry.       Neurological:     Mental Status: She is alert and oriented to person, place, and time.  Psychiatric:        Mood and Affect: Mood normal.        Behavior: Behavior normal.        Thought Content: Thought content normal.        Judgment: Judgment normal.     BP (!) 164/80 (BP Location: Right Arm)   Pulse 80   Resp 16   Wt 204 lb (92.5 kg)   BMI 31.02 kg/m   Past Medical History:  Diagnosis Date   Arthritis    rheumatoid arthritis   B12  deficiency    Cancer (HCC)    skin cancer (squamous cell)   Collagen vascular disease (HCC)    RA   Dermatitis    Diabetes mellitus without complication ([place])    type 2   Eczema    Fibrosis, pulmonary, interstitial, diffuse (HCC)    GERD (gastroesophageal reflux disease)    Headache    migraines in the past (none since menopause)   History of hiatal hernia    Hives    "chronic"   Hypertension    Loose stools    Osteoarthritis    lumbar spine, cervical spine, plantar fascitis    Pneumonia    PONV (postoperative nausea and vomiting)    Psoriasis    Rosacea    Sleep apnea 2004   does not use cpap   Urethral stenosis    w/ bladder polyps. followed by Dr Madelin Headings    Urticaria    chronic    Varicose veins     Social History   Socioeconomic History   Marital status: Married  Spouse name: Not on file   Number of children: Not on file   Years of education: Not on file   Highest education level: Not on file  Occupational History   Not on file  Tobacco Use   Smoking status: Never   Smokeless tobacco: Never  Vaping Use   Vaping Use: Never used  Substance and Sexual Activity   Alcohol use: No   Drug use: No   Sexual activity: Not on file  Other Topics Concern   Not on file  Social History Narrative   Not on file   Social Determinants of Health   Financial Resource Strain: Not on file  Food Insecurity: Not on file  Transportation Needs: Not on file  Physical Activity: Not on file  Stress: Not on file  Social Connections: Not on file  Intimate Partner Violence: Not on file    Past Surgical History:  Procedure Laterality Date   ANTERIOR CERVICAL DECOMP/DISCECTOMY FUSION N/A 08/20/2017   Procedure: Anterior Cervical Decompression Fusion - Cervical Four - Cervical Five - Cervical Five - Cervical Six;  Surgeon: Earnie Larsson, MD;  Location: [facility];  Service: Neurosurgery;  Laterality: N/A;  Anterior Cervical Decompression Fusion - Cervical Four - Cervical Five -  Cervical Five - Cervical Six   APPENDECTOMY     BILATERAL CARPAL TUNNEL RELEASE     BREAST CYST ASPIRATION Left    neg   CATARACT EXTRACTION Bilateral    CHOLECYSTECTOMY  1963   COLONOSCOPY WITH PROPOFOL N/A 12/31/2015   Procedure: COLONOSCOPY WITH PROPOFOL;  Surgeon: Lollie Sails, MD;  Location: [facility] ENDOSCOPY;  Service: Endoscopy;  Laterality: N/A;   ENDOMETRIAL ABLATION  1991   ESOPHAGOGASTRODUODENOSCOPY (EGD) WITH PROPOFOL N/A 12/31/2015   Procedure: ESOPHAGOGASTRODUODENOSCOPY (EGD) WITH PROPOFOL;  Surgeon: Lollie Sails, MD;  Location: [facility] ENDOSCOPY;  Service: Endoscopy;  Laterality: N/A;   ESOPHAGOGASTRODUODENOSCOPY (EGD) WITH PROPOFOL N/A 06/02/2016   Procedure: ESOPHAGOGASTRODUODENOSCOPY (EGD) WITH PROPOFOL;  Surgeon: Lollie Sails, MD;  Location: [facility] ENDOSCOPY;  Service: Endoscopy;  Laterality: N/A;   ESOPHAGOGASTRODUODENOSCOPY (EGD) WITH PROPOFOL N/A 11/07/2016   Procedure: ESOPHAGOGASTRODUODENOSCOPY (EGD) WITH PROPOFOL;  Surgeon: Lollie Sails, MD;  Location: [facility] ENDOSCOPY;  Service: Endoscopy;  Laterality: N/A;   EYE SURGERY Bilateral 2006 and 2012   cataract surgery with lens implant   eyelid surgery Bilateral 2014   FOOT SURGERY Right    ligament and spurs   LUMBAR LAMINECTOMY/DECOMPRESSION MICRODISCECTOMY Right 12/24/2013   Procedure: RIGHT LUMBAR THREE TO FOUR, LUMBAR FOUR TO FIVE, LUMBAR FIVE TO SACRAL ONE LAMINECTOMY/FORAMINOTOMY;  Surgeon: Floyce Stakes, MD;  Location: [facility]URO ORS;  Service: Neurosurgery;  Laterality: Right;  RIGHT L3-4 L4-5 L5-S1 LAMINECTOMY/FORAMINOTOMY   NASAL SEPTUM SURGERY  2004   SHOULDER SURGERY Right    for a frozen shoulder   SKIN CANCER EXCISION Right    leg x 4   skin cancer removal     TONSILLECTOMY     TUBAL LIGATION  1968    Family History  Problem Relation Age of Onset   COPD Mother    Arthritis/Rheumatoid Mother    Alzheimer's disease Father    Heart attack Father    Breast cancer Paternal Aunt 52     Allergies  Allergen Reactions   Methotrexate Derivatives Shortness Of Breath    Breathing difficulties    Acyclovir And Related Rash   Codeine Nausea Only   Contrast Media [Iodinated Contrast Media] Itching and Rash   Inderide [Propranolol-Hctz] Rash   Lodine [  Etodolac] Rash   Metrizamide Itching and Rash   Procardia [Nifedipine] Rash       Latest Ref Rng & Units 09/02/2021    6:57 PM 06/23/2020   11:17 AM 08/13/2017    2:30 PM  CBC  WBC 4.0 - 10.5 K/uL 10.4  7.8  6.7   Hemoglobin 12.0 - 15.0 g/dL 12.2  13.9  12.6   Hematocrit 36.0 - 46.0 % 39.7  43.7  40.6   Platelets 150 - 400 K/uL 155  109  127       CMP     Component Value Date/Time   NA 140 09/02/2021 1857   K 4.1 09/02/2021 1857   CL 108 09/02/2021 1857   CO2 24 09/02/2021 1857   GLUCOSE 78 09/02/2021 1857   BUN 23 09/02/2021 1857   CREATININE 0.81 09/02/2021 1857   CALCIUM 9.6 09/02/2021 1857   PROT 6.8 09/02/2021 2257   ALBUMIN 3.3 (L) 09/02/2021 2257   AST 25 09/02/2021 2257   ALT 16 09/02/2021 2257   ALKPHOS 109 09/02/2021 2257   BILITOT 0.8 09/02/2021 2257   GFRNONAA >60 09/02/2021 1857   GFRAA >60 08/13/2017 1430     No results found.     Assessment & Plan:   1. PVD (peripheral vascular disease) ([place]) The patient does have evidence of arterial disease and based on her studies she should have enough perfusion for wound healing.  We had a discussion regarding treatment options including a more aggressive option which would be an angiogram versus conservative therapy and observation.  The patient has already made remarkable and drastic healing since the initial occurrence.  However for the last several months she has not had advanced wound care and it believe this may be a factor in the delayed wound healing.  She does have upcoming evaluation with wound care center.  We will also culture the wound and send in antibiotics as it does appear there is an underlying infection.  We will have her return in  8 weeks for reevaluation of ABIs as well as to see if the wound has progressed under wound care. - VAS [place] ABI WITH/WO TBI  2. Essential hypertension Continue antihypertensive medications as already ordered, these medications have been reviewed and there are no changes at this time.  3. Type 2 diabetes mellitus without complication, without long-term current use of insulin (HCC) Continue hypoglycemic medications as already ordered, these medications have been reviewed and there are no changes at this time.  Hgb A1C to be monitored as already arranged by primary service   Current Outpatient Medications on File Prior to Visit  Medication Sig Dispense Refill   acetaminophen (TYLENOL) 500 MG tablet Take 500 mg by mouth every 6 (six) hours as needed (for pain.).      Ascorbic Acid (VITAMIN C) 1000 MG tablet Take 1,000 mg by mouth daily.     aspirin EC 81 MG tablet Take 81 mg by mouth daily.     bisoprolol (ZEBETA) 10 MG tablet Take 1 tablet (10 mg total) by mouth daily. 30 tablet 1   cetirizine (ZYRTEC) 10 MG tablet Take 10 mg by mouth at bedtime.      furosemide (LASIX) 20 MG tablet Take 20 mg by mouth daily. 1 1/2 tablet     gabapentin (NEURONTIN) 100 MG capsule Take 200 mg by mouth at bedtime.     HYDROcodone-acetaminophen (NORCO) 10-325 MG tablet Take 1 tablet by mouth every 4 (four) hours as needed for moderate  pain ((score 4 to 6)). 30 tablet 0   inFLIXimab (REMICADE) 100 MG injection Inject 500 mg into the vein every 6 (six) weeks.     insulin NPH Human (HUMULIN N,NOVOLIN N) 100 UNIT/ML injection Inject 0.4 mLs (40 Units total) into the skin daily before breakfast. (Patient taking differently: Inject 40 Units into the skin in the morning and at bedtime.) 10 mL 11   leflunomide (ARAVA) 10 MG tablet Take 10 mg by mouth daily.     lisinopril (PRINIVIL,ZESTRIL) 20 MG tablet Take 20 mg by mouth daily.     montelukast (SINGULAIR) 10 MG tablet Take 10 mg by mouth daily.     pantoprazole  (PROTONIX) 40 MG tablet Take 40 mg by mouth daily.     rOPINIRole (REQUIP) 0.5 MG tablet Take 0.5 mg by mouth at bedtime.     simvastatin (ZOCOR) 40 MG tablet Take 40 mg by mouth at bedtime.     spironolactone (ALDACTONE) 25 MG tablet Take 25 mg by mouth daily.     tiZANidine (ZANAFLEX) 4 MG tablet Take 4 mg by mouth 3 (three) times daily as needed for muscle spasms.     levocetirizine (XYZAL) 5 MG tablet Take 5 mg by mouth at bedtime. (Patient not taking: Reported on 12/09/2021)     No current facility-administered medications on file prior to visit.    There are no Patient Instructions on file for this visit. No follow-ups on file.   Kris Hartmann, NP

## 2021-12-22 ENCOUNTER — Encounter: Payer: Medicare Other | Attending: Physician Assistant | Admitting: Physician Assistant

## 2021-12-22 DIAGNOSIS — I10 Essential (primary) hypertension: Secondary | ICD-10-CM | POA: Insufficient documentation

## 2021-12-22 DIAGNOSIS — E11622 Type 2 diabetes mellitus with other skin ulcer: Secondary | ICD-10-CM | POA: Insufficient documentation

## 2021-12-22 DIAGNOSIS — E11621 Type 2 diabetes mellitus with foot ulcer: Secondary | ICD-10-CM | POA: Diagnosis present

## 2021-12-22 DIAGNOSIS — E1151 Type 2 diabetes mellitus with diabetic peripheral angiopathy without gangrene: Secondary | ICD-10-CM | POA: Diagnosis not present

## 2021-12-22 DIAGNOSIS — I872 Venous insufficiency (chronic) (peripheral): Secondary | ICD-10-CM | POA: Diagnosis not present

## 2021-12-22 DIAGNOSIS — I87332 Chronic venous hypertension (idiopathic) with ulcer and inflammation of left lower extremity: Secondary | ICD-10-CM | POA: Insufficient documentation

## 2021-12-22 DIAGNOSIS — L97822 Non-pressure chronic ulcer of other part of left lower leg with fat layer exposed: Secondary | ICD-10-CM | POA: Insufficient documentation

## 2021-12-24 NOTE — Progress Notes (Signed)
Courtney, BERRIAN [name] ([id number]) [phone] [id number] Page 1 of 5 Visit Report for 12/22/2021 Abuse Risk Screen Details Patient Name: Date of Service: Courtney, [name] 12/22/2021 8:45 [name] Medical Record Number: [id number] Patient Account Number: [id number] Date of Birth/Sex: Treating RN: 1941/01/06 (81 y.o. Female) Carlene Coria Primary Care Kaitlynn Tramontana: Emily Filbert Other Clinician: Referring Jaidan Stachnik: Treating Anniebelle Devore/Extender: Jeri Cos DEFO Julio Alm, Lyman Bishop M Weeks in Treatment: 0 Abuse Risk Screen Items Answer ABUSE RISK SCREEN: Has anyone close to you tried to hurt or harm you recentlyo No Do you feel uncomfortable with anyone in your familyo No Has anyone forced you do things that you didnt want to doo No Electronic Signature(s) Signed: 12/24/2021 3:52:05 PM By: Carlene Coria RN Entered By: Carlene Coria on 12/22/2021 09:16:54 -------------------------------------------------------------------------------- Activities of Daily Living Details Patient Name: Date of Service: Courtney, MAUCERI 12/22/2021 8:45 [name] Medical Record Number: [id number] Patient Account Number: [id number] Date of Birth/Sex: Treating RN: 08/02/1940 (81 y.o. Female) Carlene Coria Primary Care Timesha Cervantez: Emily Filbert Other Clinician: Referring Demetrius Mahler: Treating Shavone Nevers/Extender: Jeri Cos DEFO Julio Alm, Lyman Bishop M Weeks in Treatment: 0 Activities of Daily Living Items Answer Activities of Daily Living (Please select one for each item) Drive Automobile Completely Able T Medications ake Completely Able Use T elephone Completely Able Care for Appearance Completely Able Use T oilet Completely Able Bath / Shower Completely Able Dress Self Completely Able Feed Self Completely Able Walk Completely Able Get In / Out Bed Completely Able Housework Completely Able Courtney, WEIGAND [name] ([id number]) [id number] Page 2 of 5 Prepare Meals Completely Able Handle Money  Completely Able Shop for Self Completely Able Electronic Signature(s) Signed: 12/24/2021 3:52:05 PM By: Carlene Coria RN Entered By: Carlene Coria on 12/22/2021 09:17:35 -------------------------------------------------------------------------------- Education Screening Details Patient Name: Date of Service: Courtney [name]. 12/22/2021 8:45 [name] Medical Record Number: [id number] Patient Account Number: [id number] Date of Birth/Sex: Treating RN: July 11, 1940 (81 y.o. Female) Carlene Coria Primary Care [place]ie Mennen: Emily Filbert Other Clinician: Referring Zykerria Tanton: Treating Iram Astorino/Extender: Jeri Cos DEFO Julio Alm, Lyman Bishop Max Fickle in Treatment: 0 Primary Learner Assessed: Patient Learning Preferences/Education Level/Primary Language Learning Preference: Explanation Highest Education Level: High School Preferred Language: English Cognitive Barrier Language Barrier: No Translator Needed: No Memory Deficit: No Emotional Barrier: No Cultural/Religious Beliefs Affecting Medical Care: No Physical Barrier Impaired Vision: No Impaired Hearing: No Decreased Hand dexterity: No Knowledge/Comprehension Knowledge Level: Medium Comprehension Level: High Ability to understand written instructions: High Ability to understand verbal instructions: High Motivation Anxiety Level: Anxious Cooperation: Cooperative Education Importance: Acknowledges Need Interest in Health Problems: Asks Questions Perception: Coherent Willingness to Engage in Self-Management High Activities: Readiness to Engage in Self-Management High Activities: Electronic Signature(s) Signed: 12/24/2021 3:52:05 PM By: Carlene Coria RN Entered By: Carlene Coria on 12/22/2021 09:18:00 Courtney [name] ([id number]) [id number] Page 3 of 5 -------------------------------------------------------------------------------- Fall Risk Assessment Details Patient Name: Date of Service: Courtney, [name]  12/22/2021 8:45 [name] Medical Record Number: [id number] Patient Account Number: [id number] Date of Birth/Sex: Treating RN: [birth year] (81 y.o. Female) Carlene Coria Primary Care Andre Gallego: Emily Filbert Other Clinician: Referring Bernell Haynie: Treating Axie Hayne/Extender: Jeri Cos DEFO Julio Alm, Lyman Bishop M Weeks in Treatment: 0 Fall Risk Assessment Items Have you had 2 or more falls in the last 12 monthso 0 No Have you had any fall that resulted in injury in the last 12 monthso 0 No FALLS RISK SCREEN History of falling - immediate or within 3 months 0 No Secondary diagnosis (Do you have  2 or more medical diagnoseso) 0 No Ambulatory aid None/bed rest/wheelchair/nurse 0 No Crutches/cane/walker 0 No Furniture 0 No Intravenous therapy Access/Saline/Heparin Lock 0 No Gait/Transferring Normal/ bed rest/ wheelchair 0 No Weak (short steps with or without shuffle, stooped but able to lift head while walking, may seek 0 No support from furniture) Impaired (short steps with shuffle, may have difficulty arising from chair, head down, impaired 0 No balance) Mental Status Oriented to own ability 0 No Electronic Signature(s) Signed: 12/24/2021 3:52:05 PM By: Carlene Coria RN Entered By: Carlene Coria on 12/22/2021 09:18:09 -------------------------------------------------------------------------------- Foot Assessment Details Patient Name: Date of Service: Courtney [name]. 12/22/2021 8:45 [name] Medical Record Number: [id number] Patient Account Number: [id number] Date of Birth/Sex: Treating RN: [birth year] (81 y.o. Female) Carlene Coria Primary Care [place] Steidle: Emily Filbert Other Clinician: Referring Ryatt Corsino: Treating Courtney [name]/Extender: Jeri Cos DEFO Julio Alm, Lyman Bishop M Weeks in Treatment: 0 Foot Assessment Items Site Locations [place] [name] ([id number]) [phone] [id number] Page 4 of 5 + = Sensation present, - = Sensation absent, C = Callus, U = Ulcer R = Redness, W = Warmth, M =  Maceration, PU = Pre-ulcerative lesion F = Fissure, S = Swelling, D = Dryness Assessment Right: Left: Other Deformity: No No Prior Foot Ulcer: No No Prior Amputation: No No Charcot Joint: No No Ambulatory Status: Ambulatory Without Help Gait: Steady Electronic Signature(s) Signed: 12/24/2021 3:52:05 PM By: Carlene Coria RN Entered By: Carlene Coria on 12/22/2021 09:22:49 -------------------------------------------------------------------------------- Nutrition Risk Screening Details Patient Name: Date of Service: Courtney, [name] 12/22/2021 8:45 [name] Medical Record Number: [id number] Patient Account Number: [id number] Date of Birth/Sex: Treating RN: 05-12-1940 (81 y.o. Female) Carlene Coria Primary Care Tytus Strahle: Emily Filbert Other Clinician: Referring Cleon Signorelli: Treating Courtney [name]/Extender: Jeri Cos DEFO Julio Alm, Lyman Bishop M Weeks in Treatment: 0 Height (in): 67 Weight (lbs): 206 Body Mass Index (BMI): 32.3 Nutrition Risk Screening Items Score Screening NUTRITION RISK SCREEN: I have an illness or condition that made me change the kind and/or amount of food I eat 2 Yes I eat fewer than two meals per day 0 No I eat few fruits and vegetables, or milk products 0 No I have three or more drinks of beer, liquor or wine almost every day 0 No I have tooth or mouth problems that make it hard for me to eat 0 No I don't always have enough money to buy the food I need 0 No Baltz, Naiyah [name] ([id number]) [id number] Page 5 of 5 I eat alone most of the time 0 No I take three or more different prescribed or over-the-counter drugs [name] day 1 Yes Without wanting to, I have lost or gained 10 pounds in the last six months 0 No I am not always physically able to shop, cook and/or feed myself 0 No Nutrition Protocols Good Risk Protocol Moderate Risk Protocol 0 Provide education on nutrition High Risk Proctocol Risk Level: Moderate Risk Score: 3 Electronic  Signature(s) Signed: 12/24/2021 3:52:05 PM By: Carlene Coria RN Entered By: Carlene Coria on 12/22/2021 09:18:25

## 2021-12-24 NOTE — Progress Notes (Signed)
PRERNA, HAROLD [name] ([id number]) [id number] Page 1 of 9 Visit Report for 12/22/2021 Chief Complaint Document Details Patient Name: Date of Service: Courtney [name], Courtney [name] 12/22/2021 8:45 [name] Medical Record Number: [id number] Patient Account Number: [id number] Date of Birth/Sex: Treating RN: 11-Jan-1940 (81 y.o. Female) Carlene Coria Primary Care Provider: Emily Filbert Other Clinician: Referring Provider: Treating Provider/Extender: Jeri Cos DEFO Julio Alm, Lyman Bishop M Weeks in Treatment: 0 Information Obtained from: Patient Chief Complaint Left LE Ulcer Electronic Signature(s) Signed: 12/22/2021 9:40:44 AM By: Worthy Keeler PA-C Entered By: Worthy Keeler on 12/22/2021 09:40:43 -------------------------------------------------------------------------------- Debridement Details Patient Name: Date of Service: Courtney Leigh [name]. 12/22/2021 8:45 [name] Medical Record Number: [id number] Patient Account Number: [id number] Date of Birth/Sex: Treating RN: 04/07/1940 (81 y.o. Female) Carlene Coria Primary Care Provider: Emily Filbert Other Clinician: Referring Provider: Treating Provider/Extender: Jeri Cos DEFO Julio Alm, Lyman Bishop M Weeks in Treatment: 0 Debridement Performed for Assessment: Wound #1 Left,Lateral Lower Leg Performed By: Physician Tommie Sams., PA-C Debridement Type: Debridement Severity of Tissue Pre Debridement: Fat layer exposed Level of Consciousness (Pre-procedure): Awake and Alert Pre-procedure Verification/Time Out Yes - 09:47 Taken: Start Time: 09:47 T Area Debrided (L x W): otal 3.5 (cm) x 1.5 (cm) = 5.25 (cm) Tissue and other material debrided: Viable, Non-Viable, Slough, Subcutaneous, Skin: Dermis , Skin: Epidermis, Slough Level: Skin/Subcutaneous Tissue Debridement Description: Excisional Instrument: Curette Bleeding: Moderate Hemostasis Achieved: Pressure End Time: 09:49 Procedural Pain: 0 Post Procedural Pain: 0 Response to Treatment:  Procedure was tolerated well Courtney [name], Courtney [name] ([id number]) [id number] Page 2 of 9 Level of Consciousness (Post- Awake and Alert procedure): Post Debridement Measurements of Total Wound Length: (cm) 3.5 Width: (cm) 1.5 Depth: (cm) 0.4 Volume: (cm) 1.649 Character of Wound/Ulcer Post Debridement: Improved Severity of Tissue Post Debridement: Fat layer exposed Post Procedure Diagnosis Same as Pre-procedure Electronic Signature(s) Signed: 12/22/2021 5:02:52 PM By: Worthy Keeler PA-C Signed: 12/24/2021 3:52:05 PM By: Carlene Coria RN Entered By: Carlene Coria on 12/22/2021 09:49:52 -------------------------------------------------------------------------------- HPI Details Patient Name: Date of Service: Courtney Leigh [name]. 12/22/2021 8:45 [name] Medical Record Number: [id number] Patient Account Number: [id number] Date of Birth/Sex: Treating RN: June 10, 1940 (81 y.o. Female) Carlene Coria Primary Care Provider: Emily Filbert Other Clinician: Referring Provider: Treating Provider/Extender: Jeri Cos DEFO Julio Alm, Lyman Bishop M Weeks in Treatment: 0 History of Present Illness HPI Description: 12-22-2021 upon evaluation today patient appears to be doing well currently in regard to her wound all things considered especially in light of the picture that they showed me from August when she initially had [name] skin tear. Fortunately there does not appear to be any signs of active infection locally nor systemically at this time which is great news and overall I am extremely pleased with the fact that she is making some progress here she has been placed on Cipro due to [name] culture result that was done by rheumatologist which showed that she had Pseudomonas and Staphylococcus aureus noted as the organisms in question. Fortunately I do not think that there is anything that seems to be still present or significant here I think she is actually doing much better which is great news. With that  being said the patient tells me that she has been using Medihoney at this point which actually is really good does help to clear up [name] lot of this I do believe. In the beginning she was also placed on or rather in an Unna boot wrap based on what we are being told that  was for [name] couple of weeks and then she did not have any additional follow-up with primary eventually rheumatology who were doing injections for her realize that she had [name] wound they stopped the injections which obviously would lower her immune response and healing chances. That is when she started on the antibiotics and subsequently also was referred to vascular. The good news is from [name] vascular standpoint she seems to be probably sufficient to heal with [name] right ABI of 1.02 and [name] left ABI of 1.02 as well. Her TBI on the right was 0.63 and on the left 0.54. She does have [name] history of diabetes mellitus type 2 and she has an A1c of 6.5 on 10-17-2021. Other than this she also does have chronic venous insufficiency her leg is swollen today I definitely think [name] compression wraps probably can help her she also has hypertension Electronic Signature(s) Signed: 12/22/2021 10:05:41 AM By: Worthy Keeler PA-C Entered By: Worthy Keeler on 12/22/2021 10:05:40 [name] ([id number]) [id number] Page 3 of 9 -------------------------------------------------------------------------------- Physical Exam Details Patient Name: Date of Service: Courtney [name], Courtney [name] 12/22/2021 8:45 [name] Medical Record Number: [id number] Patient Account Number: [id number] Date of Birth/Sex: Treating RN: [birth year] (81 y.o. Female) Carlene Coria Primary Care Provider: Emily Filbert Other Clinician: Referring Provider: Treating Provider/Extender: Jeri Cos DEFO Julio Alm, Lyman Bishop M Weeks in Treatment: 0 Constitutional sitting or standing blood pressure is within target range for patient.. pulse regular and within target range for patient.Marland Kitchen  respirations regular, non-labored and within target range for patient.Marland Kitchen temperature within target range for patient.. Well-nourished and well-hydrated in no acute distress. Eyes conjunctiva clear no eyelid edema noted. pupils equal round and reactive to light and accommodation. Ears, Nose, Mouth, and Throat no gross abnormality of ear auricles or external auditory canals. normal hearing noted during conversation. mucus membranes moist. Respiratory normal breathing without difficulty. Cardiovascular 2+ dorsalis pedis/posterior tibialis pulses. no clubbing, cyanosis, significant edema, <3 sec cap refill. Musculoskeletal normal gait and posture. no significant deformity or arthritic changes, no loss or range of motion, no clubbing. Psychiatric this patient is able to make decisions and demonstrates good insight into disease process. Alert and Oriented x 3. pleasant and cooperative. Notes Upon inspection patient's wound bed actually showed signs of good granulation epithelization at this point. Fortunately I do not see any signs of active infection locally nor systemically which is great news. No fevers, chills, nausea, vomiting, or diarrhea. I did actually perform sharp debridement clearway some of the necrotic debris patient tolerated that today without complication and postdebridement the wound bed actually seems to be doing much better. Electronic Signature(s) Signed: 12/22/2021 10:26:37 AM By: Worthy Keeler PA-C Entered By: Worthy Keeler on 12/22/2021 10:26:36 -------------------------------------------------------------------------------- Physician Orders Details Patient Name: Date of Service: Courtney Leigh [name]. 12/22/2021 8:45 [name] Medical Record Number: [id number] Patient Account Number: [id number] Date of Birth/Sex: Treating RN: September 04, 1940 (81 y.o. Female) Carlene Coria Primary Care Provider: Emily Filbert Other Clinician: Referring Provider: Treating Provider/Extender: Jeri Cos DEFO Julio Alm, Lyman Bishop Max Fickle in Treatment: 0 Verbal / Phone Orders: No Courtney [name], Courtney [name] ([id number]) [id number] Page 4 of 9 Diagnosis Coding ICD-10 Coding Code Description E11.622 Type 2 diabetes mellitus with other skin ulcer I87.332 Chronic venous hypertension (idiopathic) with ulcer and inflammation of left lower extremity I73.89 Other specified peripheral vascular diseases L97.822 Non-pressure chronic ulcer of other part of left lower leg with fat layer exposed I10 Essential (primary) hypertension Follow-up Appointments Return Appointment in  1 week. Bathing/ Shower/ Hygiene May shower; gently cleanse wound with antibacterial soap, rinse and pat dry prior to dressing wounds Anesthetic (Use 'Patient Medications' Section for Anesthetic Order Entry) Lidocaine applied to wound bed Edema Control - Lymphedema / Segmental Compressive Device / Other Optional: One layer of unna paste to top of compression wrap (to act as an anchor). Elevate, Exercise Daily and [name] void Standing for Long Periods of Time. Elevate legs to the level of the heart and pump ankles as often as possible Elevate leg(s) parallel to the floor when sitting. Wound Treatment Wound #1 - Lower Leg Wound Laterality: Left, Lateral Prim Dressing: Prisma 4.34 (in) Every Other Day/30 Days ary Discharge Instructions: Moisten w/normal saline or sterile water; Cover wound as directed. Do not remove from wound bed. Secondary Dressing: ABD Pad 5x9 (in/in) Every Other Day/30 Days Discharge Instructions: Cover with ABD pad Compression Wrap: 3-LAYER WRAP - Profore Lite LF 3 Multilayer Compression Bandaging System Every Other Day/30 Days Discharge Instructions: Apply 3 multi-layer wrap as prescribed. Electronic Signature(s) Signed: 12/22/2021 5:02:52 PM By: Worthy Keeler PA-C Signed: 12/24/2021 3:52:05 PM By: Carlene Coria RN Entered By: Carlene Coria on 12/22/2021  09:50:12 -------------------------------------------------------------------------------- Problem List Details Patient Name: Date of Service: Courtney Leigh [name]. 12/22/2021 8:45 [name] Medical Record Number: [id number] Patient Account Number: [id number] Date of Birth/Sex: Treating RN: July 13, 1940 (81 y.o. Female) Carlene Coria Primary Care Provider: Emily Filbert Other Clinician: Referring Provider: Treating Provider/Extender: Jeri Cos DEFO Julio Alm, Lyman Bishop M Weeks in Treatment: 0 Active Problems ICD-10 Encounter Code Description Active Date MDM Diagnosis E11.622 Type 2 diabetes mellitus with other skin ulcer 12/22/2021 No Yes Courtney [name], Courtney [name] ([id number]) [id number] Page 5 of 9 [id number] Chronic venous hypertension (idiopathic) with ulcer and inflammation of left 12/22/2021 No Yes lower extremity I73.89 Other specified peripheral vascular diseases 12/22/2021 No Yes L97.822 Non-pressure chronic ulcer of other part of left lower leg with fat layer 12/22/2021 No Yes exposed Courtney [name] (primary) hypertension 12/22/2021 No Yes Inactive Problems Resolved Problems Electronic Signature(s) Signed: 12/22/2021 9:40:30 AM By: Worthy Keeler PA-C Previous Signature: 12/22/2021 9:39:35 AM Version By: Worthy Keeler PA-C Entered By: Worthy Keeler on 12/22/2021 09:40:30 -------------------------------------------------------------------------------- Progress Note Details Patient Name: Date of Service: Courtney Leigh [name]. 12/22/2021 8:45 [name] Medical Record Number: [id number] Patient Account Number: [id number] Date of Birth/Sex: Treating RN: [birth year] (81 y.o. Female) Carlene Coria Primary Care Provider: Emily Filbert Other Clinician: Referring Provider: Treating Provider/Extender: Jeri Cos DEFO Julio Alm, Lyman Bishop M Weeks in Treatment: 0 Subjective Chief Complaint Information obtained from Patient Left LE Ulcer History of Present Illness (HPI) 12-22-2021 upon evaluation  today patient appears to be doing well currently in regard to her wound all things considered especially in light of the picture that they showed me from August when she initially had [name] skin tear. Fortunately there does not appear to be any signs of active infection locally nor systemically at this time which is great news and overall I am extremely pleased with the fact that she is making some progress here she has been placed on Cipro due to [name] culture result that was done by rheumatologist which showed that she had Pseudomonas and Staphylococcus aureus noted as the organisms in question. Fortunately I do not think that there is anything that seems to be still present or significant here I think she is actually doing much better which is great news. With that being said the patient tells me that she has been using  Medihoney at this point which actually is really good does help to clear up [name] lot of this I do believe. In the beginning she was also placed on or rather in an Unna boot wrap based on what we are being told that was for [name] couple of weeks and then she did not have any additional follow-up with primary eventually rheumatology who were doing injections for her realize that she had [name] wound they stopped the injections which obviously would lower her immune response and healing chances. That is when she started on the antibiotics and subsequently also was referred to vascular. The good news is from [name] vascular standpoint she seems to be probably sufficient to heal with [name] right ABI of 1.02 and [name] left ABI of 1.02 as well. Her TBI on the right was 0.63 and on the left 0.54. She does have [name] history of diabetes mellitus type 2 and she has an A1c of 6.5 on 10-17-2021. Other than this she also does have chronic venous insufficiency her leg is swollen today I definitely think [name] compression wraps probably can help her she also has hypertension Patient History Allergies acyclovir, codeine, Procardia,  Iodinated Contrast Media, Inderide, metrizamide Courtney [name], Courtney [name] ([id number]) [id number] Page 6 of 9 Social History Never smoker, Marital Status - Married, Alcohol Use - Never, Drug Use - No History, Caffeine Use - Daily. Medical History Cardiovascular Patient has history of Hypertension Endocrine Patient has history of Type II Diabetes Patient is treated with Insulin. Review of Systems (ROS) Integumentary (Skin) Complains or has symptoms of Wounds, Swelling. Objective Constitutional sitting or standing blood pressure is within target range for patient.. pulse regular and within target range for patient.Marland Kitchen respirations regular, non-labored and within target range for patient.Marland Kitchen temperature within target range for patient.. Well-nourished and well-hydrated in no acute distress. Vitals Time Taken: 9:11 AM, Height: 67 in, Source: Stated, Weight: 206 lbs, Source: Stated, BMI: 32.3, Temperature: 97.9 F, Pulse: 72 bpm, Respiratory Rate: 16 breaths/min, Blood Pressure: 142/78 mmHg. Eyes conjunctiva clear no eyelid edema noted. pupils equal round and reactive to light and accommodation. Ears, Nose, Mouth, and Throat no gross abnormality of ear auricles or external auditory canals. normal hearing noted during conversation. mucus membranes moist. Respiratory normal breathing without difficulty. Cardiovascular 2+ dorsalis pedis/posterior tibialis pulses. no clubbing, cyanosis, significant edema, Musculoskeletal normal gait and posture. no significant deformity or arthritic changes, no loss or range of motion, no clubbing. Psychiatric this patient is able to make decisions and demonstrates good insight into disease process. Alert and Oriented x 3. pleasant and cooperative. General Notes: Upon inspection patient's wound bed actually showed signs of good granulation epithelization at this point. Fortunately I do not see any signs of active infection locally nor  systemically which is great news. No fevers, chills, nausea, vomiting, or diarrhea. I did actually perform sharp debridement clearway some of the necrotic debris patient tolerated that today without complication and postdebridement the wound bed actually seems to be doing much better. Integumentary (Hair, Skin) Wound #1 status is Open. Original cause of wound was Trauma. The date acquired was: 08/12/2021. The wound is located on the Left,Lateral Lower Leg. The wound measures 3.5cm length x 1.5cm width x 0.4cm depth; 4.123cm^2 area and 1.649cm^3 volume. There is Fat Layer (Subcutaneous Tissue) exposed. There is no tunneling or undermining noted. There is [name] medium amount of serosanguineous drainage noted. There is small (1-33%) red, pink granulation within the wound bed. There is [name] large (67-100%) amount of necrotic tissue  within the wound bed including Adherent Slough. Assessment Active Problems ICD-10 Type 2 diabetes mellitus with other skin ulcer Chronic venous hypertension (idiopathic) with ulcer and inflammation of left lower extremity Other specified peripheral vascular diseases Non-pressure chronic ulcer of other part of left lower leg with fat layer exposed Essential (primary) hypertension Procedures Wound #1 Pre-procedure diagnosis of Wound #1 is [name] Diabetic Wound/Ulcer of the Lower Extremity located on the Left,Lateral Lower Leg .Severity of Tissue Pre Debridement is: Fat layer exposed. There was [name] Excisional Skin/Subcutaneous Tissue Debridement with [name] total area of 5.25 sq cm performed by Tommie Sams., PA-C. With the following instrument(s): Curette to remove Viable and Non-Viable tissue/material. Material removed includes Subcutaneous Tissue, Abagayle, Klutts, [place] [name] ([id number]) [id number] Page 7 of 9 Skin: Dermis, and Skin: Epidermis. No specimens were taken. [name] time out was conducted at 09:47, prior to the start of the procedure. [name] Moderate amount  of bleeding was controlled with Pressure. The procedure was tolerated well with [name] pain level of 0 throughout and [name] pain level of 0 following the procedure. Post Debridement Measurements: 3.5cm length x 1.5cm width x 0.4cm depth; 1.649cm^3 volume. Character of Wound/Ulcer Post Debridement is improved. Severity of Tissue Post Debridement is: Fat layer exposed. Post procedure Diagnosis Wound #1: Same as Pre-Procedure Plan Follow-up Appointments: Return Appointment in 1 week. Bathing/ Shower/ Hygiene: May shower; gently cleanse wound with antibacterial soap, rinse and pat dry prior to dressing wounds Anesthetic (Use 'Patient Medications' Section for Anesthetic Order Entry): Lidocaine applied to wound bed Edema Control - Lymphedema / Segmental Compressive Device / Other: Optional: One layer of unna paste to top of compression wrap (to act as an anchor). Elevate, Exercise Daily and Avoid Standing for Long Periods of Time. Elevate legs to the level of the heart and pump ankles as often as possible Elevate leg(s) parallel to the floor when sitting. WOUND #1: - Lower Leg Wound Laterality: Left, Lateral Prim Dressing: Prisma 4.34 (in) Every Other Day/30 Days ary Discharge Instructions: Moisten w/normal saline or sterile water; Cover wound as directed. Do not remove from wound bed. Secondary Dressing: ABD Pad 5x9 (in/in) Every Other Day/30 Days Discharge Instructions: Cover with ABD pad Com pression Wrap: 3-LAYER WRAP - Profore Lite LF 3 Multilayer Compression Bandaging System Every Other Day/30 Days Discharge Instructions: Apply 3 multi-layer wrap as prescribed. 1. Based on what I am seeing I would recommend that we go ahead and initiate treatment with [name] silver collagen dressing which I think is probably to be the best way to go. 2. I am also can recommend ABD pad to cover 3 layer compression wrap following. 3. I would also recommend that the patient should continue her Cipro to completion.  Hopefully that will do well for her. 4. I am also going to suggest that we have the patient continue with the recommendation for elevation obviously I think that her edema control is crucial to keep things moving in the right direction. We will see patient back for reevaluation in 1 week here in the clinic. If anything worsens or changes patient will contact our office for additional recommendations. Electronic Signature(s) Signed: 12/22/2021 11:03:27 AM By: Worthy Keeler PA-C Entered By: Worthy Keeler on 12/22/2021 11:03:27 -------------------------------------------------------------------------------- ROS/PFSH Details Patient Name: Date of Service: Courtney Leigh [name]. 12/22/2021 8:45 [name] Medical Record Number: [id number] Patient Account Number: [id number] Date of Birth/Sex: Treating RN: 1940/12/19 (81 y.o. Female) Carlene Coria Primary Care Provider: Emily Filbert Other Clinician: Referring Provider: Treating Provider/Extender:  Stone, Vanita Cannell DEFO Julio Alm, Lyman Bishop M Weeks in Treatment: 0 Integumentary (Skin) Complaints and Symptoms: Positive for: Wounds; Swelling Cardiovascular Medical History: Positive for: Hypertension Courtney [name], Courtney [name] ([id number]) [id number] Page 8 of 9 Endocrine Medical History: Positive for: Type II Diabetes Time with diabetes: 36 Treated with: Insulin Immunizations Pneumococcal Vaccine: Received Pneumococcal Vaccination: Yes Received Pneumococcal Vaccination On or After 60th Birthday: Yes Implantable Devices None Family and Social History Never smoker; Marital Status - Married; Alcohol Use: Never; Drug Use: No History; Caffeine Use: Daily Electronic Signature(s) Signed: 12/22/2021 5:02:52 PM By: Worthy Keeler PA-C Signed: 12/24/2021 3:52:05 PM By: Carlene Coria RN Entered By: Carlene Coria on 12/22/2021 09:16:48 -------------------------------------------------------------------------------- SuperBill Details Patient Name:  Date of Service: Courtney Leigh [name]. 12/22/2021 Medical Record Number: [id number] Patient Account Number: [id number] Date of Birth/Sex: Treating RN: September 07, 1940 (81 y.o. Female) Carlene Coria Primary Care Provider: Emily Filbert Other Clinician: Referring Provider: Treating Provider/Extender: Jeri Cos DEFO Julio Alm, Lyman Bishop M Weeks in Treatment: 0 Diagnosis Coding ICD-10 Codes Code Description E11.622 Type 2 diabetes mellitus with other skin ulcer I87.332 Chronic venous hypertension (idiopathic) with ulcer and inflammation of left lower extremity I73.89 Other specified peripheral vascular diseases L97.822 Non-pressure chronic ulcer of other part of left lower leg with fat layer exposed I10 Essential (primary) hypertension Facility Procedures : CPT4 Code: 17001749 Description: 99213 - WOUND CARE VISIT-LEV 3 EST PT Modifier: Quantity: 1 : CPT4 Code: [id number] Description: 63846 - DEB SUBQ TISSUE 20 SQ CM/< ICD-10 Diagnosis Description L97.822 Non-pressure chronic ulcer of other part of left lower leg with fat layer expos Modifier: ed Quantity: 1 Physician Procedures : CPT4 Code Description Modifier [id number] WC PHYS LEVEL 3 NEW PT 25 ICD-10 Diagnosis Description E11.622 Type 2 diabetes mellitus with other skin ulcer Courtney [name], Courtney [name] ([id number]) [id number] I87.332 Chronic venous hypertension  (idiopathic) with ulcer and inflammation of left lower extremity I73.89 Other specified peripheral vascular diseases L97.822 Non-pressure chronic ulcer of other part of left lower leg with fat layer exposed Quantity: 1 [id number] Page 9 of 9 : [id number] 11042 - WC PHYS SUBQ TISS 20 SQ CM ICD-10 Diagnosis Description L97.822 Non-pressure chronic ulcer of other part of left lower leg with fat layer exposed Quantity: 1 Electronic Signature(s) Signed: 12/22/2021 11:03:50 AM By: Worthy Keeler PA-C Entered By: Worthy Keeler on 12/22/2021 11:03:49

## 2021-12-24 NOTE — Progress Notes (Signed)
HAJRA, PORT [name] ([id number]) [id number] Page 1 of 9 Visit Report for 12/22/2021 Allergy List Details Patient Name: Date of Service: Courtney [name], Courtney [name] 12/22/2021 8:45 [name] Medical Record Number: [id number] Patient Account Number: [id number] Date of Birth/Sex: Treating RN: [birth year] (81 y.o. Female) Carlene Coria Primary Care Lilu Mcglown: Emily Filbert Other Clinician: Referring Graceland Wachter: Treating Tytianna Greenley/Extender: Jeri Cos DEFO Julio Alm, Lyman Bishop M Weeks in Treatment: 0 Allergies Active Allergies acyclovir codeine Procardia Iodinated Contrast Media Inderide metrizamide Allergy Notes Electronic Signature(s) Signed: 12/24/2021 3:52:05 PM By: Carlene Coria RN Entered By: Carlene Coria on 12/22/2021 09:33:59 -------------------------------------------------------------------------------- Arrival Information Details Patient Name: Date of Service: Courtney Leigh [name]. 12/22/2021 8:45 [name] Medical Record Number: [id number] Patient Account Number: [id number] Date of Birth/Sex: Treating RN: November 19, 1940 (81 y.o. Female) Carlene Coria Primary Care Kellyann Ordway: Emily Filbert Other Clinician: Referring Rayshad Riviello: Treating Javon Hupfer/Extender: Jeri Cos DEFO Julio Alm, Lyman Bishop Max Fickle in Treatment: 0 Visit Information Patient Arrived: [place] Arrival Time: 09:08 Accompanied By: husband Transfer Assistance: None Courtney [name], Courtney [name] ([id number]) [id number] Page 2 of 9 Patient Identification Verified: Yes Secondary Verification Process Completed: Yes Patient Requires Transmission-Based Precautions: No Patient Has Alerts: Yes Patient Alerts: ABI R1.02 TBI .63 12/09/21 ABI L 1.02TBI .54 12/09/21 Electronic Signature(s) Signed: 12/24/2021 3:52:05 PM By: Carlene Coria RN Entered By: Carlene Coria on 12/22/2021 09:10:01 -------------------------------------------------------------------------------- Clinic Level of Care Assessment Details Patient Name: Date of  Service: Courtney [name], Courtney [name] 12/22/2021 8:45 [name] Medical Record Number: [id number] Patient Account Number: [id number] Date of Birth/Sex: Treating RN: 04-06-1940 (81 y.o. Female) Carlene Coria Primary Care Ashlynne Shetterly: Emily Filbert Other Clinician: Referring Elli Groesbeck: Treating Naquan Garman/Extender: Jeri Cos DEFO Julio Alm, Lyman Bishop M Weeks in Treatment: 0 Clinic Level of Care Assessment Items TOOL 1 Quantity Score X- 1 0 Use when EandM and Procedure is performed on INITIAL visit ASSESSMENTS - Nursing Assessment / Reassessment X- 1 20 General Physical Exam (combine w/ comprehensive assessment (listed just below) when performed on new pt. evals) X- 1 25 Comprehensive Assessment (HX, ROS, Risk Assessments, Wounds Hx, etc.) ASSESSMENTS - Wound and Skin Assessment / Reassessment '[]'$  - 0 Dermatologic / Skin Assessment (not related to wound area) ASSESSMENTS - Ostomy and/or Continence Assessment and Care '[]'$  - 0 Incontinence Assessment and Management '[]'$  - 0 Ostomy Care Assessment and Management (repouching, etc.) PROCESS - Coordination of Care X - Simple Patient / Family Education for ongoing care 1 15 '[]'$  - 0 Complex (extensive) Patient / Family Education for ongoing care X- 1 10 Staff obtains Programmer, systems, Records, T Results / Process Orders est '[]'$  - 0 Staff telephones HHA, Nursing Homes / Clarify orders / etc '[]'$  - 0 Routine Transfer to another Facility (non-emergent condition) '[]'$  - 0 Routine Hospital Admission (non-emergent condition) X- 1 15 New Admissions / Biomedical engineer / Ordering NPWT Apligraf, etc. , '[]'$  - 0 Emergency Hospital Admission (emergent condition) PROCESS - Special Needs '[]'$  - 0 Pediatric / Minor Patient Management '[]'$  - 0 Isolation Patient Management '[]'$  - 0 Hearing / Language / Visual special needs '[]'$  - 0 Assessment of Community assistance (transportation, D/C planning, etc.) '[]'$  - 0 Additional assistance / Altered mentation Morrone, Hawley [name] ([id number])  [id number] Page 3 of 9 '[]'$  - 0 Support Surface(s) Assessment (bed, cushion, seat, etc.) INTERVENTIONS - Miscellaneous '[]'$  - 0 External ear exam '[]'$  - 0 Patient Transfer (multiple staff / Civil Service fast streamer / Similar devices) '[]'$  - 0 Simple Staple / Suture removal (25 or less) '[]'$  - 0 Complex Staple / Suture removal (26 or  more) '[]'$  - 0 Hypo/Hyperglycemic Management (do not check if billed separately) '[]'$  - 0 Ankle / Brachial Index (ABI) - do not check if billed separately Has the patient been seen at the hospital within the last three years: Yes Total Score: 85 Level Of Care: New/Established - Level 3 Electronic Signature(s) Signed: 12/24/2021 3:52:05 PM By: Carlene Coria RN Entered By: Carlene Coria on 12/22/2021 09:50:28 -------------------------------------------------------------------------------- Encounter Discharge Information Details Patient Name: Date of Service: Courtney Leigh [name]. 12/22/2021 8:45 [name] Medical Record Number: [id number] Patient Account Number: [id number] Date of Birth/Sex: Treating RN: [birth year] (81 y.o. Female) Carlene Coria Primary Care Dimond Crotty: Emily Filbert Other Clinician: Referring Maicie Vanderloop: Treating Izic Stfort/Extender: Jeri Cos DEFO Julio Alm, Lyman Bishop M Weeks in Treatment: 0 Encounter Discharge Information Items Post Procedure Vitals Discharge Condition: Stable Temperature (F): 97.9 Ambulatory Status: Cane Pulse (bpm): 72 Discharge Destination: Home Respiratory Rate (breaths/min): 16 Transportation: Private Auto Blood Pressure (mmHg): 142/78 Accompanied By: husband Schedule Follow-up Appointment: Yes Clinical Summary of Care: Electronic Signature(s) Signed: 12/24/2021 3:52:05 PM By: Carlene Coria RN Entered By: Carlene Coria on 12/22/2021 09:53:09 -------------------------------------------------------------------------------- Lower Extremity Assessment Details Patient Name: Date of Service: Courtney [name], Courtney [name] 12/22/2021 8:45 [name]  Barney Drain, Blanche [name] ([id number]) [id number] Page 4 of 9 Medical Record Number: [id number] Patient Account Number: [id number] Date of Birth/Sex: Treating RN: 08-15-1940 (81 y.o. Female) Carlene Coria Primary Care Jennifier Smitherman: Emily Filbert Other Clinician: Referring Arhan Mcmanamon: Treating Ayleen Mckinstry/Extender: Jeri Cos DEFO Julio Alm, Lyman Bishop M Weeks in Treatment: 0 Edema Assessment Assessed: [Left: No] [Right: No] Edema: [Left: Ye] [Right: s] Calf Left: Right: Point of Measurement: 33 cm From Medial Instep 40 cm Ankle Left: Right: Point of Measurement: 10 cm From Medial Instep 26 cm Knee To Floor Left: Right: From Medial Instep 43 cm Vascular Assessment Pulses: Dorsalis Pedis Palpable: [Right:Yes] Electronic Signature(s) Signed: 12/24/2021 3:52:05 PM By: Carlene Coria RN Entered By: Carlene Coria on 12/22/2021 09:33:47 -------------------------------------------------------------------------------- Multi Wound Chart Details Patient Name: Date of Service: Courtney Leigh [name]. 12/22/2021 8:45 [name] Medical Record Number: [id number] Patient Account Number: [id number] Date of Birth/Sex: Treating RN: [birth year] (81 y.o. Female) Carlene Coria Primary Care Nessie Nong: Emily Filbert Other Clinician: Referring Lasheba Stevens: Treating Ferol Laiche/Extender: Jeri Cos DEFO Julio Alm, Lyman Bishop M Weeks in Treatment: 0 Vital Signs Height(in): 67 Pulse(bpm): 72 Weight(lbs): 206 Blood Pressure(mmHg): 142/78 Body Mass Index(BMI): 32.3 Temperature(F): 97.9 Respiratory Rate(breaths/min): 16 [1:Photos:] [N/[name]:N/[name]] Left, Lateral Lower Leg N/[name] N/[name] Wound Location: Trauma N/[name] N/[name] Wounding Event: Diabetic Wound/Ulcer of the Lower N/[name] N/[name] Primary Etiology: Extremity Hypertension, Type II Diabetes N/[name] N/[name] Comorbid History: 08/12/2021 N/[name] N/[name] Date Acquired: 0 N/[name] N/[name] Weeks of Treatment: Open N/[name] N/[name] Wound Status: No N/[name] N/[name] Wound Recurrence: 3.5x1.5x0.4 N/[name] N/[name] Measurements L x W x D (cm) 4.123  N/[name] N/[name] [name] (cm) : rea 1.649 N/[name] N/[name] Volume (cm) : Grade 1 N/[name] N/[name] Classification: Medium N/[name] N/[name] Exudate [name] mount: Serosanguineous N/[name] N/[name] Exudate Type: red, brown N/[name] N/[name] Exudate Color: Small (1-33%) N/[name] N/[name] Granulation [name] mount: Red, Pink N/[name] N/[name] Granulation Quality: Large (67-100%) N/[name] N/[name] Necrotic [name] mount: Fat Layer (Subcutaneous Tissue): Yes N/[name] N/[name] Exposed Structures: Fascia: No Tendon: No Muscle: No Joint: No Bone: No Treatment Notes Electronic Signature(s) Signed: 12/24/2021 3:52:05 PM By: Carlene Coria RN Entered By: Carlene Coria on 12/22/2021 09:34:44 -------------------------------------------------------------------------------- Multi-Disciplinary Care Plan Details Patient Name: Date of Service: Courtney Leigh [name]. 12/22/2021 8:45 [name] Medical Record Number: [id number] Patient Account Number: [id number] Date of Birth/Sex: Treating RN: 1940/07/31 (81 y.o.  Female) Carlene Coria Primary Care Akosua Constantine: Emily Filbert Other Clinician: Referring Rose Hegner: Treating Alyviah Crandle/Extender: Jeri Cos DEFO Julio Alm, Lyman Bishop M Weeks in Treatment: 0 Active Inactive Necrotic Tissue Nursing Diagnoses: Knowledge deficit related to management of necrotic/devitalized tissue Goals: Patient/caregiver will verbalize understanding of reason and process for debridement of necrotic tissue Date Initiated: 12/22/2021 Target Resolution Date: 01/22/2022 Goal Status: Active Interventions: Assess patient pain level pre-, during and post procedure and prior to discharge Provide education on necrotic tissue and debridement process Notes: Wound/Skin Impairment Courtney [name], Courtney [name] ([id number]) [id number] Page 6 of 9 Nursing Diagnoses: Knowledge deficit related to ulceration/compromised skin integrity Goals: Patient/caregiver will verbalize understanding of skin care regimen Date Initiated: 12/22/2021 Target Resolution Date: 01/22/2022 Goal Status: Active Ulcer/skin  breakdown will have [name] volume reduction of 30% by week 4 Date Initiated: 12/22/2021 Target Resolution Date: 01/22/2022 Goal Status: Active Ulcer/skin breakdown will have [name] volume reduction of 50% by week 8 Date Initiated: 12/22/2021 Target Resolution Date: 02/22/2022 Goal Status: Active Ulcer/skin breakdown will have [name] volume reduction of 80% by week 12 Date Initiated: 12/22/2021 Target Resolution Date: 03/23/2022 Goal Status: Active Ulcer/skin breakdown will heal within 14 weeks Date Initiated: 12/22/2021 Target Resolution Date: 04/23/2022 Goal Status: Active Interventions: Assess patient/caregiver ability to obtain necessary supplies Assess patient/caregiver ability to perform ulcer/skin care regimen upon admission and as needed Assess ulceration(s) every visit Notes: Electronic Signature(s) Signed: 12/24/2021 3:52:05 PM By: Carlene Coria RN Entered By: Carlene Coria on 12/22/2021 09:52:14 -------------------------------------------------------------------------------- Pain Assessment Details Patient Name: Date of Service: Courtney [name], Courtney [name] 12/22/2021 8:45 [name] Medical Record Number: [id number] Patient Account Number: [id number] Date of Birth/Sex: Treating RN: Jan 11, 1940 (81 y.o. Female) Carlene Coria Primary Care Pamelyn Bancroft: Emily Filbert Other Clinician: Referring Sarinah Doetsch: Treating Rafal Archuleta/Extender: Jeri Cos DEFO Julio Alm, Lyman Bishop M Weeks in Treatment: 0 Active Problems Location of Pain Severity and Description of Pain Patient Has Paino No Site Locations [place] [name] ([id number]) [id number] Page 7 of 9 Pain Management and Medication Current Pain Management: Electronic Signature(s) Signed: 12/24/2021 3:52:05 PM By: Carlene Coria RN Entered By: Carlene Coria on 12/22/2021 09:10:55 -------------------------------------------------------------------------------- Patient/Caregiver Education Details Patient Name: Date of Service: Courtney [name]  12/14/2023andnbsp8:45 [name] Medical Record Number: [id number] Patient Account Number: [id number] Date of Birth/Gender: Treating RN: 07-Oct-1940 (81 y.o. Female) Carlene Coria Primary Care Physician: Emily Filbert Other Clinician: Referring Physician: Treating Physician/Extender: Jeri Cos DEFO Julio Alm, Lyman Bishop Max Fickle in Treatment: 0 Education Assessment Education Provided To: Patient Education Topics Provided Wound/Skin Impairment: Methods: Explain/Verbal Responses: State content correctly Electronic Signature(s) Signed: 12/24/2021 3:52:05 PM By: Carlene Coria RN Entered By: Carlene Coria on 12/22/2021 09:50:45 Courtney [name] ([id number]) [id number] Page 8 of 9 -------------------------------------------------------------------------------- Wound Assessment Details Patient Name: Date of Service: Courtney [name], Courtney [name] 12/22/2021 8:45 [name] Medical Record Number: [id number] Patient Account Number: [id number] Date of Birth/Sex: Treating RN: 05-25-1940 (81 y.o. Female) Carlene Coria Primary Care Dakai Braithwaite: Emily Filbert Other Clinician: Referring Sala Tague: Treating Paolo Okane/Extender: Jeri Cos DEFO Julio Alm, Lyman Bishop M Weeks in Treatment: 0 Wound Status Wound Number: 1 Primary Etiology: Diabetic Wound/Ulcer of the Lower Extremity Wound Location: Left, Lateral Lower Leg Wound Status: Open Wounding Event: Trauma Comorbid History: Hypertension, Type II Diabetes Date Acquired: 08/12/2021 Weeks Of Treatment: 0 Clustered Wound: No Photos Wound Measurements Length: (cm) 3.5 Width: (cm) 1.5 Depth: (cm) 0.4 Area: (cm) 4.123 Volume: (cm) 1.649 % Reduction in Area: % Reduction in Volume: Tunneling: No Undermining: No Wound Description Classification: Grade 1 Exudate Amount: Medium Exudate Type: Serosanguineous  Exudate Color: red, brown Foul Odor After Cleansing: No Slough/Fibrino Yes Wound Bed Granulation Amount: Small (1-33%) Exposed Structure Granulation Quality:  Red, Pink Fascia Exposed: No Necrotic Amount: Large (67-100%) Fat Layer (Subcutaneous Tissue) Exposed: Yes Necrotic Quality: Adherent Slough Tendon Exposed: No Muscle Exposed: No Joint Exposed: No Bone Exposed: No Treatment Notes Wound #1 (Lower Leg) Wound Laterality: Left, Lateral Cleanser Peri-Wound Care Topical Courtney [name], Courtney [name] ([id number]) [id number] Page 9 of 9 Primary Dressing Prisma 4.34 (in) Discharge Instruction: Moisten w/normal saline or sterile water; Cover wound as directed. Do not remove from wound bed. Secondary Dressing ABD Pad 5x9 (in/in) Discharge Instruction: Cover with ABD pad Secured With Compression Wrap 3-LAYER WRAP - Profore Lite LF 3 Multilayer Compression Bandaging System Discharge Instruction: Apply 3 multi-layer wrap as prescribed. Compression Stockings Add-Ons Electronic Signature(s) Signed: 12/24/2021 3:52:05 PM By: Carlene Coria RN Entered By: Carlene Coria on 12/22/2021 09:33:15 -------------------------------------------------------------------------------- Vitals Details Patient Name: Date of Service: Courtney Leigh [name]. 12/22/2021 8:45 [name] Medical Record Number: [id number] Patient Account Number: [id number] Date of Birth/Sex: Treating RN: Sep 07, 1940 (81 y.o. Female) Carlene Coria Primary Care Santiel Topper: Emily Filbert Other Clinician: Referring Jeff Mccallum: Treating Yosgart Pavey/Extender: Jeri Cos DEFO Julio Alm, Lyman Bishop M Weeks in Treatment: 0 Vital Signs Time Taken: 09:11 Temperature (F): 97.9 Height (in): 67 Pulse (bpm): 72 Source: Stated Respiratory Rate (breaths/min): 16 Weight (lbs): 206 Blood Pressure (mmHg): 142/78 Source: Stated Reference Range: 80 - 120 mg / dl Body Mass Index (BMI): 32.3 Electronic Signature(s) Signed: 12/24/2021 3:52:05 PM By: Carlene Coria RN Entered By: Carlene Coria on 12/22/2021 09:11:56

## 2021-12-29 ENCOUNTER — Encounter: Payer: Medicare Other | Admitting: Physician Assistant

## 2021-12-29 DIAGNOSIS — I87332 Chronic venous hypertension (idiopathic) with ulcer and inflammation of left lower extremity: Secondary | ICD-10-CM | POA: Diagnosis not present

## 2021-12-29 NOTE — Progress Notes (Addendum)
CALEY, CIARAMITARO [name] ([id number]) [id number] Page 1 of 7 Visit Report for 12/29/2021 Chief Complaint Document Details Patient Name: Date of Service: Courtney [name], Courtney [name] 12/29/2021 1:30 PM Medical Record Number: [id number] Patient Account Number: [id number] Date of Birth/Sex: Treating RN: December 03, 1940 (81 y.o. Marlowe Shores Primary Care Provider: Emily Filbert Other Clinician: Massie Kluver Referring Provider: Treating Provider/Extender: Leatha Gilding in Treatment: 1 Information Obtained from: Patient Chief Complaint Left LE Ulcer Electronic Signature(s) Signed: 12/29/2021 1:51:14 PM By: Worthy Keeler PA-C Entered By: Worthy Keeler on 12/29/2021 13:51:14 -------------------------------------------------------------------------------- Debridement Details Patient Name: Date of Service: Courtney Leigh [name]. 12/29/2021 1:30 PM Medical Record Number: [id number] Patient Account Number: [id number] Date of Birth/Sex: Treating RN: 28-Mar-1940 (81 y.o. [place]e Shores Primary Care Provider: Emily Filbert Other Clinician: Massie Kluver Referring Provider: Treating Provider/Extender: Leatha Gilding in Treatment: 1 Debridement Performed for Assessment: Wound #1 Left,Lateral Lower Leg Performed By: Physician Tommie Sams., PA-C Debridement Type: Debridement Severity of Tissue Pre Debridement: Fat layer exposed Level of Consciousness (Pre-procedure): Awake and Alert Pre-procedure Verification/Time Out Yes - 14:04 Taken: Start Time: 14:04 T Area Debrided (L x W): otal 3.5 (cm) x 1.2 (cm) = 4.2 (cm) Tissue and other material debrided: Viable, Non-Viable, Slough, Subcutaneous, Slough Level: Skin/Subcutaneous Tissue Debridement Description: Excisional Instrument: Curette Bleeding: Minimum Hemostasis Achieved: Pressure Response to Treatment: Procedure was tolerated well Level of Consciousness (Post- Awake and Alert procedure): Courtney [name], Courtney [name] ([id number]) [id number] Page 2 of 7 Post Debridement Measurements of Total Wound Length: (cm) 3.5 Width: (cm) 1.2 Depth: (cm) 0.2 Volume: (cm) 0.66 Character of Wound/Ulcer Post Debridement: Stable Severity of Tissue Post Debridement: Fat layer exposed Post Procedure Diagnosis Same as Pre-procedure Electronic Signature(s) Signed: 12/29/2021 3:46:10 PM By: Gretta Cool, BSN, RN, CWS, [name] RN, BSN Signed: 12/29/2021 4:23:52 PM By: Worthy Keeler PA-C Signed: 12/30/2021 9:48:27 AM By: Massie Kluver Entered By: Massie Kluver on 12/29/2021 14:05:55 -------------------------------------------------------------------------------- HPI Details Patient Name: Date of Service: Courtney Leigh [name]. 12/29/2021 1:30 PM Medical Record Number: [id number] Patient Account Number: [id number] Date of Birth/Sex: Treating RN: [birth year] (81 y.o. [place]e Shores Primary Care Provider: Emily Filbert Other Clinician: Massie Kluver Referring Provider: Treating Provider/Extender: Leatha Gilding in Treatment: 1 History of Present Illness HPI Description: 12-22-2021 upon evaluation today patient appears to be doing well currently in regard to her wound all things considered especially in light of the picture that they showed me from August when she initially had [name] skin tear. Fortunately there does not appear to be any signs of active infection locally nor systemically at this time which is great news and overall I am extremely pleased with the fact that she is making some progress here she has been placed on Cipro due to [name] culture result that was done by rheumatologist which showed that she had Pseudomonas and Staphylococcus aureus noted as the organisms in question. Fortunately I do not think that there is anything that seems to be still present or significant here I think she is actually doing much better which is great news. With that being said the patient tells me  that she has been using Medihoney at this point which actually is really good does help to clear up [name] lot of this I do believe. In the beginning she was also placed on or rather in an Unna boot wrap based on what we are being told that was for [name] couple of weeks and then she did  not have any additional follow-up with primary eventually rheumatology who were doing injections for her realize that she had [name] wound they stopped the injections which obviously would lower her immune response and healing chances. That is when she started on the antibiotics and subsequently also was referred to vascular. The good news is from [name] vascular standpoint she seems to be probably sufficient to heal with [name] right ABI of 1.02 and [name] left ABI of 1.02 as well. Her TBI on the right was 0.63 and on the left 0.54. She does have [name] history of diabetes mellitus type 2 and she has an A1c of 6.5 on 10-17-2021. Other than this she also does have chronic venous insufficiency her leg is swollen today I definitely think [name] compression wraps probably can help her she also has hypertension 12-29-2021 upon evaluation patient's wound is actually showing signs of excellent improvement. Fortunately I do not see any signs of infection I think she is making good progress here and overall I do believe that she is really looking quite nice as far as the overall appearance of the wound bed is concerned. There is [name] little bit of necrotic tissue noted on the surface of the wound that is going require debridement today. Electronic Signature(s) Signed: 12/29/2021 3:29:52 PM By: Worthy Keeler PA-C Entered By: Worthy Keeler on 12/29/2021 15:29:52 Courtney [name], Courtney [name] ([id number]) [id number] Page 3 of 7 -------------------------------------------------------------------------------- Physical Exam Details Patient Name: Date of Service: Courtney [name], Courtney [name] 12/29/2021 1:30 PM Medical Record Number: [id number] Patient Account  Number: [id number] Date of Birth/Sex: Treating RN: 06/09/1940 (81 y.o. [place] Primary Care Provider: Emily Filbert Other Clinician: Massie Kluver Referring Provider: Treating Provider/Extender: Leatha Gilding in Treatment: 1 Constitutional Well-nourished and well-hydrated in no acute distress. Respiratory normal breathing without difficulty. Psychiatric this patient is able to make decisions and demonstrates good insight into disease process. Alert and Oriented x 3. pleasant and cooperative. Notes Patient's wound bed did require sharp debridement clearway some of the necrotic debris she tolerated that today without complication and postdebridement the wound bed appears to be doing significantly better which is great news overall I am extremely pleased with where we stand. Electronic Signature(s) Signed: 12/29/2021 3:30:05 PM By: Worthy Keeler PA-C Entered By: Worthy Keeler on 12/29/2021 15:30:05 -------------------------------------------------------------------------------- Physician Orders Details Patient Name: Date of Service: Courtney Leigh [name]. 12/29/2021 1:30 PM Medical Record Number: [id number] Patient Account Number: [id number] Date of Birth/Sex: Treating RN: 11/13/1940 (81 y.o. [name], [name] Primary Care Provider: Emily Filbert Other Clinician: Massie Kluver Referring Provider: Treating Provider/Extender: Leatha Gilding in Treatment: 1 Verbal / Phone Orders: No Diagnosis Coding ICD-10 Coding Code Description E11.622 Type 2 diabetes mellitus with other skin ulcer I87.332 Chronic venous hypertension (idiopathic) with ulcer and inflammation of left lower extremity I73.89 Other specified peripheral vascular diseases L97.822 Non-pressure chronic ulcer of other part of left lower leg with fat layer exposed I10 Essential (primary) hypertension Follow-up Appointments Courtney [name], Courtney [name] ([id number]) [id number]  Page 4 of 7 Return Appointment in 1 week. Bathing/ Shower/ Hygiene May shower; gently cleanse wound with antibacterial soap, rinse and pat dry prior to dressing wounds Anesthetic (Use 'Patient Medications' Section for Anesthetic Order Entry) Lidocaine applied to wound bed Edema Control - Lymphedema / Segmental Compressive Device / Other Optional: One layer of unna paste to top of compression wrap (to act as an anchor). Elevate, Exercise Daily and [name] void Standing for Long Periods of Time.  Elevate legs to the level of the heart and pump ankles as often as possible Elevate leg(s) parallel to the floor when sitting. Wound Treatment Wound #1 - Lower Leg Wound Laterality: Left, Lateral Prim Dressing: Prisma 4.34 (in) Every Other Day/30 Days ary Discharge Instructions: Moisten w/normal saline or sterile water; Cover wound as directed. Do not remove from wound bed. Secondary Dressing: ABD Pad 5x9 (in/in) Every Other Day/30 Days Discharge Instructions: Cover with ABD pad Compression Wrap: 3-LAYER WRAP - Profore Lite LF 3 Multilayer Compression Bandaging System Every Other Day/30 Days Discharge Instructions: Apply 3 multi-layer wrap as prescribed. Electronic Signature(s) Signed: 12/29/2021 4:23:52 PM By: Worthy Keeler PA-C Signed: 12/30/2021 9:48:27 AM By: Massie Kluver Entered By: Massie Kluver on 12/29/2021 14:06:32 -------------------------------------------------------------------------------- Problem List Details Patient Name: Date of Service: Courtney Leigh [name]. 12/29/2021 1:30 PM Medical Record Number: [id number] Patient Account Number: [id number] Date of Birth/Sex: Treating RN: 18-Mar-1940 (81 y.o. [place] Primary Care Provider: Emily Filbert Other Clinician: Massie Kluver Referring Provider: Treating Provider/Extender: Leatha Gilding in Treatment: 1 Active Problems ICD-10 Encounter Code Description Active Date MDM Diagnosis E11.622 Type 2 diabetes  mellitus with other skin ulcer 12/22/2021 No Yes I87.332 Chronic venous hypertension (idiopathic) with ulcer and inflammation of left 12/22/2021 No Yes lower extremity I73.89 Other specified peripheral vascular diseases 12/22/2021 No Yes L97.822 Non-pressure chronic ulcer of other part of left lower leg with fat layer 12/22/2021 No Yes exposed Courtney [name], Courtney [name] ([id number]) [id number] Page 5 of 7 I10 Essential (primary) hypertension 12/22/2021 No Yes Inactive Problems Resolved Problems Electronic Signature(s) Signed: 12/29/2021 1:51:09 PM By: Worthy Keeler PA-C Entered By: Worthy Keeler on 12/29/2021 13:51:09 -------------------------------------------------------------------------------- Progress Note Details Patient Name: Date of Service: Courtney Adan Sis [name]. 12/29/2021 1:30 PM Medical Record Number: [id number] Patient Account Number: [id number] Date of Birth/Sex: Treating RN: Dec 28, 1940 (81 y.o. [place] Primary Care Provider: Emily Filbert Other Clinician: Massie Kluver Referring Provider: Treating Provider/Extender: Leatha Gilding in Treatment: 1 Subjective Chief Complaint Information obtained from Patient Left LE Ulcer History of Present Illness (HPI) 12-22-2021 upon evaluation today patient appears to be doing well currently in regard to her wound all things considered especially in light of the picture that they showed me from August when she initially had [name] skin tear. Fortunately there does not appear to be any signs of active infection locally nor systemically at this time which is great news and overall I am extremely pleased with the fact that she is making some progress here she has been placed on Cipro due to [name] culture result that was done by rheumatologist which showed that she had Pseudomonas and Staphylococcus aureus noted as the organisms in question. Fortunately I do not think that there is anything that seems to be  still present or significant here I think she is actually doing much better which is great news. With that being said the patient tells me that she has been using Medihoney at this point which actually is really good does help to clear up [name] lot of this I do believe. In the beginning she was also placed on or rather in an Unna boot wrap based on what we are being told that was for [name] couple of weeks and then she did not have any additional follow-up with primary eventually rheumatology who were doing injections for her realize that she had [name] wound they stopped the injections which obviously would lower her immune response and healing chances.  That is when she started on the antibiotics and subsequently also was referred to vascular. The good news is from [name] vascular standpoint she seems to be probably sufficient to heal with [name] right ABI of 1.02 and [name] left ABI of 1.02 as well. Her TBI on the right was 0.63 and on the left 0.54. She does have [name] history of diabetes mellitus type 2 and she has an A1c of 6.5 on 10-17-2021. Other than this she also does have chronic venous insufficiency her leg is swollen today I definitely think [name] compression wraps probably can help her she also has hypertension 12-29-2021 upon evaluation patient's wound is actually showing signs of excellent improvement. Fortunately I do not see any signs of infection I think she is making good progress here and overall I do believe that she is really looking quite nice as far as the overall appearance of the wound bed is concerned. There is [name] little bit of necrotic tissue noted on the surface of the wound that is going require debridement today. Objective Constitutional Well-nourished and well-hydrated in no acute distress. Vitals Time Taken: 1:45 PM, Weight: 206 lbs, Temperature: 98.0 F, Pulse: 71 bpm, Respiratory Rate: 18 breaths/min, Blood Pressure: 177/73 mmHg. Respiratory Courtney [name], Courtney [name] ([id number])  [id number] Page 6 of 7 normal breathing without difficulty. Psychiatric this patient is able to make decisions and demonstrates good insight into disease process. Alert and Oriented x 3. pleasant and cooperative. General Notes: Patient's wound bed did require sharp debridement clearway some of the necrotic debris she tolerated that today without complication and postdebridement the wound bed appears to be doing significantly better which is great news overall I am extremely pleased with where we stand. Integumentary (Hair, Skin) Wound #1 status is Open. Original cause of wound was Trauma. The date acquired was: 08/12/2021. The wound has been in treatment 1 weeks. The wound is located on the Left,Lateral Lower Leg. The wound measures 3.5cm length x 1.2cm width x 0.2cm depth; 3.299cm^2 area and 0.66cm^3 volume. There is Fat Layer (Subcutaneous Tissue) exposed. There is no tunneling or undermining noted. There is [name] medium amount of serosanguineous drainage noted. There is small (1-33%) red, pink granulation within the wound bed. There is [name] large (67-100%) amount of necrotic tissue within the wound bed including Adherent Slough. Assessment Active Problems ICD-10 Type 2 diabetes mellitus with other skin ulcer Chronic venous hypertension (idiopathic) with ulcer and inflammation of left lower extremity Other specified peripheral vascular diseases Non-pressure chronic ulcer of other part of left lower leg with fat layer exposed Essential (primary) hypertension Procedures Wound #1 Pre-procedure diagnosis of Wound #1 is [name] Diabetic Wound/Ulcer of the Lower Extremity located on the Left,Lateral Lower Leg .Severity of Tissue Pre Debridement is: Fat layer exposed. There was [name] Excisional Skin/Subcutaneous Tissue Debridement with [name] total area of 4.2 sq cm performed by Tommie Sams., PA-C. With the following instrument(s): Curette to remove Viable and Non-Viable tissue/material.  Material removed includes Subcutaneous Tissue and Slough and. [name] time out was conducted at 14:04, prior to the start of the procedure. [name] Minimum amount of bleeding was controlled with Pressure. The procedure was tolerated well. Post Debridement Measurements: 3.5cm length x 1.2cm width x 0.2cm depth; 0.66cm^3 volume. Character of Wound/Ulcer Post Debridement is stable. Severity of Tissue Post Debridement is: Fat layer exposed. Post procedure Diagnosis Wound #1: Same as Pre-Procedure Pre-procedure diagnosis of Wound #1 is [name] Diabetic Wound/Ulcer of the Lower Extremity located on the Left,Lateral Lower Leg . There was  [name] Three Layer Compression Therapy Procedure with [name] pre-treatment ABI of 1 by Massie Kluver. Post procedure Diagnosis Wound #1: Same as Pre-Procedure Plan Follow-up Appointments: Return Appointment in 1 week. Bathing/ Shower/ Hygiene: May shower; gently cleanse wound with antibacterial soap, rinse and pat dry prior to dressing wounds Anesthetic (Use 'Patient Medications' Section for Anesthetic Order Entry): Lidocaine applied to wound bed Edema Control - Lymphedema / Segmental Compressive Device / Other: Optional: One layer of unna paste to top of compression wrap (to act as an anchor). Elevate, Exercise Daily and Avoid Standing for Long Periods of Time. Elevate legs to the level of the heart and pump ankles as often as possible Elevate leg(s) parallel to the floor when sitting. WOUND #1: - Lower Leg Wound Laterality: Left, Lateral Prim Dressing: Prisma 4.34 (in) Every Other Day/30 Days ary Discharge Instructions: Moisten w/normal saline or sterile water; Cover wound as directed. Do not remove from wound bed. Secondary Dressing: ABD Pad 5x9 (in/in) Every Other Day/30 Days Discharge Instructions: Cover with ABD pad Com pression Wrap: 3-LAYER WRAP - Profore Lite LF 3 Multilayer Compression Bandaging System Every Other Day/30 Days Discharge Instructions: Apply 3 multi-layer wrap as  prescribed. 1. I am going to suggest that the patient should continue to monitor for any signs of infection or worsening. Obviously if anything worsens in general she should let me know this would include increased pain as she is in [name] wrap but obviously right now she seems to really be doing quite well. 2. I am also can recommend that we have her continue along with the silver collagen and ABD pad [name] 3 layer compression wrap which is doing well to control her edema. We will see patient back for reevaluation in 1 week here in the clinic. If anything worsens or changes patient will contact our office for additional recommendations. BERNISHA, Courtney [name] ([id number]) [id number] Page 7 of 7 Electronic Signature(s) Signed: 12/29/2021 3:30:43 PM By: Worthy Keeler PA-C Entered By: Worthy Keeler on 12/29/2021 15:30:42 -------------------------------------------------------------------------------- SuperBill Details Patient Name: Date of Service: Lesia Sago 12/29/2021 Medical Record Number: [id number] Patient Account Number: [id number] Date of Birth/Sex: Treating RN: 1940/09/19 (81 y.o. [name], [name] Primary Care Provider: Emily Filbert Other Clinician: Massie Kluver Referring Provider: Treating Provider/Extender: Leatha Gilding in Treatment: 1 Diagnosis Coding ICD-10 Codes Code Description [phone] Type 2 diabetes mellitus with other skin ulcer I87.332 Chronic venous hypertension (idiopathic) with ulcer and inflammation of left lower extremity I73.89 Other specified peripheral vascular diseases L97.822 Non-pressure chronic ulcer of other part of left lower leg with fat layer exposed I10 Essential (primary) hypertension Facility Procedures : CPT4 Code: [id number] Description: 19417 - DEB SUBQ TISSUE 20 SQ CM/< ICD-10 Diagnosis Description L97.822 Non-pressure chronic ulcer of other part of left lower leg with fat layer expo Modifier:  sed Quantity: 1 Physician Procedures : CPT4 Code Description Modifier [id number] 11042 - WC PHYS SUBQ TISS 20 SQ CM ICD-10 Diagnosis Description L97.822 Non-pressure chronic ulcer of other part of left lower leg with fat layer exposed Quantity: 1 Electronic Signature(s) Signed: 12/29/2021 3:30:58 PM By: Worthy Keeler PA-C Entered By: Worthy Keeler on 12/29/2021 15:30:57

## 2021-12-30 NOTE — Progress Notes (Signed)
Courtney, [name] [name] ([id number]) [id number] Page 1 of 9 Visit Report for 12/29/2021 Arrival Information Details Patient Name: Date of Service: Courtney [name], Courtney [name] 12/29/2021 1:30 PM Medical Record Number: [id number] Patient Account Number: [id number] Date of Birth/Sex: Treating RN: 06-09-1940 (81 y.o. Courtney [name] Primary Care Courtney [name]: Courtney [name] Other Clinician: Massie [name] Referring Guliana Weyandt: Treating Courtney [name]/Extender: Courtney [name] in Treatment: 1 Visit Information History Since Last Visit All ordered tests and consults were completed: No Patient Arrived: Courtney [name] Added or deleted any medications: No Arrival Time: 13:44 Any new allergies or adverse reactions: No Transfer Assistance: None Had [name] fall or experienced change in No Patient Identification Verified: Yes activities of daily living that may affect Secondary Verification Process Completed: Yes risk of falls: Patient Requires Transmission-Based Precautions: No Signs or symptoms of abuse/neglect since last visito No Patient Has Alerts: Yes Hospitalized since last visit: No Patient Alerts: ABI R1.02 TBI .63 12/09/21 Implantable device outside of the clinic excluding No ABI L 1.02TBI .54 12/09/21 cellular tissue based products placed in the center since last visit: Has Dressing in Place as Prescribed: Yes Has Compression in Place as Prescribed: Yes Pain Present Now: No Electronic Signature(s) Signed: 12/30/2021 9:48:27 AM By: Courtney [name] Entered By: Courtney [name] on 12/29/2021 13:45:09 -------------------------------------------------------------------------------- Clinic Level of Care Assessment Details Patient Name: Date of Service: Courtney [name], Courtney [name] 12/29/2021 1:30 PM Medical Record Number: [id number] Patient Account Number: [id number] Date of Birth/Sex: Treating RN: 01-07-1941 (81 y.o. Courtney [name] Primary Care Aften Lipsey: Courtney [name] Other Clinician: Massie [name] Referring Reham Slabaugh: Treating Courtney [name]/Extender: Courtney [name] in Treatment: 1 Clinic Level of Care Assessment Items TOOL 1 Quantity Score '[]'$  - 0 Use when EandM and Procedure is performed on INITIAL visit ASSESSMENTS - Nursing Assessment / Reassessment '[]'$  - 0 General Physical Exam (combine w/ comprehensive assessment (listed just below) when performed on new pt. evals) Courtney, [name] [name] ([id number]) [id number] Page 2 of 9 '[]'$  - 0 Comprehensive Assessment (HX, ROS, Risk Assessments, Wounds Hx, etc.) ASSESSMENTS - Wound and Skin Assessment / Reassessment '[]'$  - 0 Dermatologic / Skin Assessment (not related to wound area) ASSESSMENTS - Ostomy and/or Continence Assessment and Care '[]'$  - 0 Incontinence Assessment and Management '[]'$  - 0 Ostomy Care Assessment and Management (repouching, etc.) PROCESS - Coordination of Care '[]'$  - 0 Simple Patient / Family Education for ongoing care '[]'$  - 0 Complex (extensive) Patient / Family Education for ongoing care '[]'$  - 0 Staff obtains Programmer, systems, Records, T Results / Process Orders est '[]'$  - 0 Staff telephones HHA, Nursing Homes / Clarify orders / etc '[]'$  - 0 Routine Transfer to another Facility (non-emergent condition) '[]'$  - 0 Routine Hospital Admission (non-emergent condition) '[]'$  - 0 New Admissions / Biomedical engineer / Ordering NPWT Apligraf, etc. , '[]'$  - 0 Emergency Hospital Admission (emergent condition) PROCESS - Special Needs '[]'$  - 0 Pediatric / Minor Patient Management '[]'$  - 0 Isolation Patient Management '[]'$  - 0 Hearing / Language / Visual special needs '[]'$  - 0 Assessment of Community assistance (transportation, D/C planning, etc.) '[]'$  - 0 Additional assistance / Altered mentation '[]'$  - 0 Support Surface(s) Assessment (bed, cushion, seat, etc.) INTERVENTIONS - Miscellaneous '[]'$  - 0 External ear exam '[]'$  - 0 Patient Transfer (multiple staff / Civil Service fast streamer / Similar devices) '[]'$  -  0 Simple Staple / Suture removal (25 or less) '[]'$  - 0 Complex Staple / Suture removal (26 or more) '[]'$  - 0 Hypo/Hyperglycemic Management (do not check if billed separately) '[]'$  -  0 Ankle / Brachial Index (ABI) - do not check if billed separately Has the patient been seen at the hospital within the last three years: Yes Total Score: 0 Level Of Care: ____ Electronic Signature(s) Signed: 12/30/2021 9:48:27 AM By: Courtney [name] Entered By: Courtney [name] on 12/29/2021 14:06:38 -------------------------------------------------------------------------------- Compression Therapy Details Patient Name: Date of Service: Courtney [name]. 12/29/2021 1:30 PM Medical Record Number: [id number] Patient Account Number: [id number] Date of Birth/Sex: Treating RN: 1940-05-01 (81 y.o. Courtney [name] Primary Care Courtney [name]: Courtney [name] Other Clinician: Samanthajo, Payano [name] ([id number]) [id number] Page 3 of 9 Referring Maleena Eddleman: Treating Courtney [name]/Extender: Courtney [name] in Treatment: 1 Compression Therapy Performed for Wound Assessment: Wound #1 Left,Lateral Lower Leg Performed By: Courtney [name], Compression Type: Three Layer Pre Treatment ABI: 1 Post Procedure Diagnosis Same as Pre-procedure Electronic Signature(s) Signed: 12/30/2021 9:48:27 AM By: Courtney [name] Entered By: Courtney [name] on 12/29/2021 14:06:21 -------------------------------------------------------------------------------- Encounter Discharge Information Details Patient Name: Date of Service: Courtney [name]. 12/29/2021 1:30 PM Medical Record Number: [id number] Patient Account Number: [id number] Date of Birth/Sex: Treating RN: 1940-02-20 (80 y.o. Courtney [name] Primary Care Courtney [name]: Courtney [name] Other Clinician: Massie [name] Referring Saivon Prowse: Treating Courtney [name]/Extender: Courtney [name] in Treatment: 1 Encounter Discharge Information Items  Post Procedure Vitals Discharge Condition: Stable Temperature (F): 98.0 Ambulatory Status: Cane Pulse (bpm): 71 Discharge Destination: Home Respiratory Rate (breaths/min): 18 Transportation: Private Auto Blood Pressure (mmHg): 177/73 Accompanied By: husband Schedule Follow-up Appointment: Yes Clinical Summary of Care: Electronic Signature(s) Signed: 12/30/2021 9:48:27 AM By: Courtney [name] Entered By: Courtney [name] on 12/29/2021 14:42:53 -------------------------------------------------------------------------------- Lower Extremity Assessment Details Patient Name: Date of Service: Courtney [name], Courtney [name] 12/29/2021 1:30 PM Medical Record Number: [id number] Patient Account Number: [id number] Date of Birth/Sex: Treating RN: Sep 26, 1940 (81 y.o. Courtney [name] Primary Care Irie Fiorello: Courtney [name] Other Clinician: Massie [name] Referring Ronell Boldin: Treating Eveleigh Crumpler/Extender: Courtney [name] in Treatment: 1 JULLIE, ARPS [name] ([id number]) [id number] Page 4 of 9 Edema Assessment Assessed: [Left: Yes] [Right: No] Edema: [Left: Ye] [Right: s] Calf Left: Right: Point of Measurement: 33 cm From Medial Instep 37 cm Ankle Left: Right: Point of Measurement: 10 cm From Medial Instep 25.5 cm Vascular Assessment Pulses: Dorsalis Pedis Palpable: [Left:Yes] Electronic Signature(s) Signed: 12/29/2021 3:46:10 PM By: Gretta Cool, BSN, RN, CWS, [name] RN, BSN Signed: 12/30/2021 9:48:27 AM By: Courtney [name] Entered By: Courtney [name] on 12/29/2021 13:59:13 -------------------------------------------------------------------------------- Multi Wound Chart Details Patient Name: Date of Service: Courtney [name]. 12/29/2021 1:30 PM Medical Record Number: [id number] Patient Account Number: [id number] Date of Birth/Sex: Treating RN: [birth year] (81 y.o. Courtney [name] Primary Care Hollie Wojahn: Courtney [name] Other Clinician: Massie [name] Referring Rockland Kotarski: Treating  Emelee Rodocker/Extender: Courtney [name] in Treatment: 1 Vital Signs Height(in): Pulse(bpm): 71 Weight(lbs): 206 Blood Pressure(mmHg): 177/73 Body Mass Index(BMI): Temperature(F): 98.0 Respiratory Rate(breaths/min): 18 [1:Photos:] [N/[name]:N/[name]] Left, Lateral Lower Leg N/[name] N/[name] Wound Location: Trauma N/[name] N/[name] Wounding Event: Diabetic Wound/Ulcer of the Lower N/[name] N/[name] Primary Etiology: Extremity Hypertension, Type II Diabetes N/[name] N/[name] Comorbid History: 08/12/2021 N/[name] N/[name] Date Acquired: 1 N/[name] N/[name] Weeks of Treatment: Open N/[name] N/[name] Wound Status: Courtney [name], Courtney [name] ([id number]) [id number] Page 5 of 9 No N/[name] N/[name] Wound Recurrence: 3.5x1.2x0.2 N/[name] N/[name] Measurements L x W x D (cm) 3.299 N/[name] N/[name] [name] (cm) : rea 0.66 N/[name] N/[name] Volume (cm) : 20.00% N/[name] N/[name] % Reduction in [name] rea: 60.00% N/[name] N/[name] % Reduction in Volume: Grade 1 N/[name] N/[name] Classification:  Medium N/[name] N/[name] Exudate [name] mount: Serosanguineous N/[name] N/[name] Exudate Type: red, brown N/[name] N/[name] Exudate Color: Small (1-33%) N/[name] N/[name] Granulation [name] mount: Red, Pink N/[name] N/[name] Granulation Quality: Large (67-100%) N/[name] N/[name] Necrotic [name] mount: Fat Layer (Subcutaneous Tissue): Yes N/[name] N/[name] Exposed Structures: Fascia: No Tendon: No Muscle: No Joint: No Bone: No Treatment Notes Electronic Signature(s) Signed: 12/30/2021 9:48:27 AM By: Courtney [name] Entered By: Courtney [name] on 12/29/2021 13:59:20 -------------------------------------------------------------------------------- Multi-Disciplinary Care Plan Details Patient Name: Date of Service: Courtney [name]. 12/29/2021 1:30 PM Medical Record Number: [id number] Patient Account Number: [id number] Date of Birth/Sex: Treating RN: 1940/07/30 (81 y.o. [name], [name] Primary Care Grizel Vesely: Courtney [name] Other Clinician: Massie [name] Referring Neola Worrall: Treating Rawlins Stuard/Extender: Courtney [name] in Treatment: 1 Active Inactive Necrotic  Tissue Nursing Diagnoses: Knowledge deficit related to management of necrotic/devitalized tissue Goals: Patient/caregiver will verbalize understanding of reason and process for debridement of necrotic tissue Date Initiated: 12/22/2021 Target Resolution Date: 01/22/2022 Goal Status: Active Interventions: Assess patient pain level pre-, during and post procedure and prior to discharge Provide education on necrotic tissue and debridement process Notes: Wound/Skin Impairment Nursing Diagnoses: Knowledge deficit related to ulceration/compromised skin integrity Goals: Patient/caregiver will verbalize understanding of skin care regimen Date Initiated: 12/22/2021 Target Resolution Date: 01/22/2022 Goal Status: Active Courtney [name], Courtney [name] ([id number]) [id number] Page 6 of 9 Ulcer/skin breakdown will have [name] volume reduction of 30% by week 4 Date Initiated: 12/22/2021 Target Resolution Date: 01/22/2022 Goal Status: Active Ulcer/skin breakdown will have [name] volume reduction of 50% by week 8 Date Initiated: 12/22/2021 Target Resolution Date: 02/22/2022 Goal Status: Active Ulcer/skin breakdown will have [name] volume reduction of 80% by week 12 Date Initiated: 12/22/2021 Target Resolution Date: 03/23/2022 Goal Status: Active Ulcer/skin breakdown will heal within 14 weeks Date Initiated: 12/22/2021 Target Resolution Date: 04/23/2022 Goal Status: Active Interventions: Assess patient/caregiver ability to obtain necessary supplies Assess patient/caregiver ability to perform ulcer/skin care regimen upon admission and as needed Assess ulceration(s) every visit Notes: Electronic Signature(s) Signed: 12/29/2021 3:46:10 PM By: Gretta Cool, BSN, RN, CWS, [name] RN, BSN Signed: 12/30/2021 9:48:27 AM By: Courtney [name] Entered By: Courtney [name] on 12/29/2021 14:22:53 -------------------------------------------------------------------------------- Pain Assessment Details Patient Name: Date of  Service: Courtney [name]. 12/29/2021 1:30 PM Medical Record Number: [id number] Patient Account Number: [id number] Date of Birth/Sex: Treating RN: Apr 13, 1940 (81 y.o. Courtney [name] Primary Care Kewanda [place]: Courtney [name] Other Clinician: Massie [name] Referring Christinea Brizuela: Treating Lizanne Erker/Extender: Courtney [name] in Treatment: 1 Active Problems Location of Pain Severity and Description of Pain Patient Has Paino No Site Locations Pain Management and Medication Current Pain Management: Courtney [name], Courtney [name] ([id number]) [id number] Page 7 of 9 Electronic Signature(s) Signed: 12/29/2021 3:46:10 PM By: Gretta Cool, BSN, RN, CWS, [name] RN, BSN Signed: 12/30/2021 9:48:27 AM By: Courtney [name] Entered By: Courtney [name] on 12/29/2021 13:49:43 -------------------------------------------------------------------------------- Patient/Caregiver Education Details Patient Name: Date of Service: Courtney [name] 12/21/2023andnbsp1:30 PM Medical Record Number: [id number] Patient Account Number: [id number] Date of Birth/Gender: Treating RN: 09-Dec-1940 (81 y.o. Courtney [name] Primary Care Physician: Courtney [name] Other Clinician: Massie [name] Referring Physician: Treating Physician/Extender: Courtney [name] in Treatment: 1 Education Assessment Education Provided To: Patient Education Topics Provided Wound/Skin Impairment: Handouts: Other: continue wound care as directed Methods: Explain/Verbal Responses: State content correctly Electronic Signature(s) Signed: 12/30/2021 9:48:27 AM By: Courtney [name] Entered By: Courtney [name] on 12/29/2021 14:07:00 -------------------------------------------------------------------------------- Wound Assessment Details Patient Name: Date of Service: Courtney [name]. 12/29/2021 1:30 PM Medical Record Number:  [id number] Patient Account Number: [id number] Date of Birth/Sex: Treating RN: [birth year] (81 y.o. Courtney [name] Primary Care Amilliana Hayworth: Courtney [name] Other Clinician: Massie [name] Referring Rilynn Habel: Treating Cordell Coke/Extender: Courtney [name] in Treatment: 1 Wound Status Wound Number: 1 Primary Etiology: Diabetic Wound/Ulcer of the Lower Extremity Wound Location: Left, Lateral Lower Leg Wound Status: Open Wounding Event: Trauma Comorbid History: Hypertension, Type II Diabetes Date Acquired: 08/12/2021 Weeks Of Treatment: 1 Courtney [name], Courtney [name] ([id number]) [id number] Page 8 of 9 Clustered Wound: No Photos Wound Measurements Length: (cm) 3.5 Width: (cm) 1.2 Depth: (cm) 0.2 Area: (cm) 3.299 Volume: (cm) 0.66 % Reduction in Area: 20% % Reduction in Volume: 60% Tunneling: No Undermining: No Wound Description Classification: Grade 1 Exudate Amount: Medium Exudate Type: Serosanguineous Exudate Color: red, brown Foul Odor After Cleansing: No Slough/Fibrino Yes Wound Bed Granulation Amount: Small (1-33%) Exposed Structure Granulation Quality: Red, Pink Fascia Exposed: No Necrotic Amount: Large (67-100%) Fat Layer (Subcutaneous Tissue) Exposed: Yes Necrotic Quality: Adherent Slough Tendon Exposed: No Muscle Exposed: No Joint Exposed: No Bone Exposed: No Treatment Notes Wound #1 (Lower Leg) Wound Laterality: Left, Lateral Cleanser Peri-Wound Care Topical Primary Dressing Prisma 4.34 (in) Discharge Instruction: Moisten w/normal saline or sterile water; Cover wound as directed. Do not remove from wound bed. Secondary Dressing ABD Pad 5x9 (in/in) Discharge Instruction: Cover with ABD pad Secured With Compression Wrap 3-LAYER WRAP - Profore Lite LF 3 Multilayer Compression Bandaging System Discharge Instruction: Apply 3 multi-layer wrap as prescribed. Compression Stockings Add-Ons Electronic Signature(s) Signed: 12/29/2021 3:46:10 PM By: Gretta Cool, BSN, RN, CWS, [name] RN, BSN Signed: 12/30/2021 9:48:27 AM By: Courtney [name] Entered  By: Courtney [name] on 12/29/2021 13:58:04 Wiehe, Tamalyn [name] ([id number]) [id number] Page 9 of 9 -------------------------------------------------------------------------------- Vitals Details Patient Name: Date of Service: Courtney [name], Courtney [name] 12/29/2021 1:30 PM Medical Record Number: [id number] Patient Account Number: [id number] Date of Birth/Sex: Treating RN: 11-Sep-1940 (81 y.o. Courtney [name] Primary Care Jonaven Hilgers: Courtney [name] Other Clinician: Massie [name] Referring Adilene Areola: Treating Victoriano Campion/Extender: Courtney [name] in Treatment: 1 Vital Signs Time Taken: 13:45 Temperature (F): 98.0 Weight (lbs): 206 Pulse (bpm): 71 Respiratory Rate (breaths/min): 18 Blood Pressure (mmHg): 177/73 Reference Range: 80 - 120 mg / dl Electronic Signature(s) Signed: 12/30/2021 9:48:27 AM By: Courtney [name] Entered By: Courtney [name] on 12/29/2021 13:49:29

## 2022-01-05 ENCOUNTER — Encounter: Payer: Medicare Other | Admitting: Internal Medicine

## 2022-01-05 DIAGNOSIS — I87332 Chronic venous hypertension (idiopathic) with ulcer and inflammation of left lower extremity: Secondary | ICD-10-CM | POA: Diagnosis not present

## 2022-01-06 NOTE — Progress Notes (Signed)
RUTHEL, MARTINE [name] ([id number]) [id number] Page 1 of 5 Visit Report for 01/05/2022 HPI Details Patient Name: Date of Service: Courtney [name], Courtney [name] 01/05/2022 12:30 PM Medical Record Number: [id number] Patient Account Number: [id number] Date of Birth/Sex: Treating RN: 1940/09/09 (81 y.o. Orvan Falconer Primary Care Provider: Emily Filbert Other Clinician: Referring Provider: Treating Provider/Extender: Eldridge Dace, MICHA EL Jolyn Nap in Treatment: 2 History of Present Illness HPI Description: 12-22-2021 upon evaluation today patient appears to be doing well currently in regard to her wound all things considered especially in light of the picture that they showed me from August when she initially had [name] skin tear. Fortunately there does not appear to be any signs of active infection locally nor systemically at this time which is great news and overall I am extremely pleased with the fact that she is making some progress here she has been placed on Cipro due to [name] culture result that was done by rheumatologist which showed that she had Pseudomonas and Staphylococcus aureus noted as the organisms in question. Fortunately I do not think that there is anything that seems to be still present or significant here I think she is actually doing much better which is great news. With that being said the patient tells me that she has been using Medihoney at this point which actually is really good does help to clear up [name] lot of this I do believe. In the beginning she was also placed on or rather in an Unna boot wrap based on what we are being told that was for [name] couple of weeks and then she did not have any additional follow-up with primary eventually rheumatology who were doing injections for her realize that she had [name] wound they stopped the injections which obviously would lower her immune response and healing chances. That is when she started on the antibiotics and  subsequently also was referred to vascular. The good news is from [name] vascular standpoint she seems to be probably sufficient to heal with [name] right ABI of 1.02 and [name] left ABI of 1.02 as well. Her TBI on the right was 0.63 and on the left 0.54. She does have [name] history of diabetes mellitus type 2 and she has an A1c of 6.5 on 10-17-2021. Other than this she also does have chronic venous insufficiency her leg is swollen today I definitely think [name] compression wraps probably can help her she also has hypertension 12-29-2021 upon evaluation patient's wound is actually showing signs of excellent improvement. Fortunately I do not see any signs of infection I think she is making good progress here and overall I do believe that she is really looking quite nice as far as the overall appearance of the wound bed is concerned. There is [name] little bit of necrotic tissue noted on the surface of the wound that is going require debridement today. 12/28; continued improvement in the area on the left lateral leg which was initially [name] skin tear in the setting of chronic venous insufficiency. She has been using Prisma with 3 layer compression. The length of the wound is down 0.5 cm Electronic Signature(s) Signed: 01/05/2022 4:17:44 PM By: Linton Ham MD Entered By: Linton Ham on 01/05/2022 13:22:13 -------------------------------------------------------------------------------- Physical Exam Details Patient Name: Date of Service: Courtney [name]. 01/05/2022 12:30 PM Medical Record Number: [id number] Patient Account Number: [id number] Date of Birth/Sex: Treating RN: 05-09-1940 (81 y.o. Orvan Falconer Primary Care Provider: Emily Filbert Other Clinician: Referring Provider: Treating Provider/Extender: Carolyn Stare  BSO N, Clarendon Miller, Mark Weeks in Treatment: 2 Constitutional TAKESHIA, WENK [name] ([id number]) [id number] Page 2 of 5 Patient is hypertensive.. Pulse regular and within target  range for patient.Marland Kitchen Respirations regular, non-labored and within target range.. Temperature is normal and within the target range for the patient.Marland Kitchen appears in no distress. Notes Wound exam; under illumination the wound still has some depth. No visible debris on the surface and I did not do any sharp debridement. No evidence of surrounding infection. Her edema control was quite adequate. Pedal pulses palpable Electronic Signature(s) Signed: 01/05/2022 4:17:44 PM By: Linton Ham MD Entered By: Linton Ham on 01/05/2022 13:23:38 -------------------------------------------------------------------------------- Physician Orders Details Patient Name: Date of Service: Courtney [name]. 01/05/2022 12:30 PM Medical Record Number: [id number] Patient Account Number: [id number] Date of Birth/Sex: Treating RN: 10-11-1940 (81 y.o. Orvan Falconer Primary Care Provider: Emily Filbert Other Clinician: Referring Provider: Treating Provider/Extender: RO BSO Delane Ginger, MICHA EL Jolyn Nap in Treatment: 2 Verbal / Phone Orders: No Diagnosis Coding Follow-up Appointments Return Appointment in 1 week. Bathing/ Shower/ Hygiene May shower; gently cleanse wound with antibacterial soap, rinse and pat dry prior to dressing wounds Anesthetic (Use 'Patient Medications' Section for Anesthetic Order Entry) Lidocaine applied to wound bed Edema Control - Lymphedema / Segmental Compressive Device / Other Optional: One layer of unna paste to top of compression wrap (to act as an anchor). Elevate, Exercise Daily and [name] void Standing for Long Periods of Time. Elevate legs to the level of the heart and pump ankles as often as possible Elevate leg(s) parallel to the floor when sitting. Wound Treatment Wound #1 - Lower Leg Wound Laterality: Left, Lateral Prim Dressing: Prisma 4.34 (in) Every Other Day/30 Days ary Discharge Instructions: Moisten w/normal saline or sterile water; Cover wound as directed. Do not  remove from wound bed. Secondary Dressing: ABD Pad 5x9 (in/in) Every Other Day/30 Days Discharge Instructions: Cover with ABD pad Compression Wrap: 3-LAYER WRAP - Profore Lite LF 3 Multilayer Compression Bandaging System Every Other Day/30 Days Discharge Instructions: Apply 3 multi-layer wrap as prescribed. Electronic Signature(s) Signed: 01/05/2022 4:17:44 PM By: Linton Ham MD Signed: 01/06/2022 11:04:23 AM By: Carlene Coria RN Entered By: Carlene Coria on 01/05/2022 13:05:15 [facility], ILLINGWORTH [name] ([id number]) [id number] Page 3 of 5 -------------------------------------------------------------------------------- Problem List Details Patient Name: Date of Service: CARYLON, TAMBURRO 01/05/2022 12:30 PM Medical Record Number: [id number] Patient Account Number: [id number] Date of Birth/Sex: Treating RN: July 10, 1940 (81 y.o. Orvan Falconer Primary Care Provider: Emily Filbert Other Clinician: Referring Provider: Treating Provider/Extender: Eldridge Dace, MICHA EL Jolyn Nap in Treatment: 2 Active Problems ICD-10 Encounter Code Description Active Date MDM Diagnosis E11.622 Type 2 diabetes mellitus with other skin ulcer 12/22/2021 No Yes I87.332 Chronic venous hypertension (idiopathic) with ulcer and inflammation of left 12/22/2021 No Yes lower extremity I73.89 Other specified peripheral vascular diseases 12/22/2021 No Yes L97.822 Non-pressure chronic ulcer of other part of left lower leg with fat layer 12/22/2021 No Yes exposed [place] (primary) hypertension 12/22/2021 No Yes Inactive Problems Resolved Problems Electronic Signature(s) Signed: 01/05/2022 4:17:44 PM By: Linton Ham MD Entered By: Linton Ham on 01/05/2022 13:21:28 -------------------------------------------------------------------------------- Progress Note Details Patient Name: Date of Service: Courtney [name]. 01/05/2022 12:30 PM Medical Record Number:  [id number] Patient Account Number: [id number] AVALYN, MOLINO [name] ([id number]) [id number] Page 4 of 5 Date of Birth/Sex: Treating RN: 1940/07/11 (81 y.o. Orvan Falconer Primary Care Provider: Other Clinician: Emily Filbert Referring Provider: Treating Provider/Extender: Carolyn Stare  BSO N, MICHA EL G Miller, Mark Weeks in Treatment: 2 Subjective History of Present Illness (HPI) 12-22-2021 upon evaluation today patient appears to be doing well currently in regard to her wound all things considered especially in light of the picture that they showed me from August when she initially had [name] skin tear. Fortunately there does not appear to be any signs of active infection locally nor systemically at this time which is great news and overall I am extremely pleased with the fact that she is making some progress here she has been placed on Cipro due to [name] culture result that was done by rheumatologist which showed that she had Pseudomonas and Staphylococcus aureus noted as the organisms in question. Fortunately I do not think that there is anything that seems to be still present or significant here I think she is actually doing much better which is great news. With that being said the patient tells me that she has been using Medihoney at this point which actually is really good does help to clear up [name] lot of this I do believe. In the beginning she was also placed on or rather in an Unna boot wrap based on what we are being told that was for [name] couple of weeks and then she did not have any additional follow-up with primary eventually rheumatology who were doing injections for her realize that she had [name] wound they stopped the injections which obviously would lower her immune response and healing chances. That is when she started on the antibiotics and subsequently also was referred to vascular. The good news is from [name] vascular standpoint she seems to be probably sufficient to heal with [name] right  ABI of 1.02 and [name] left ABI of 1.02 as well. Her TBI on the right was 0.63 and on the left 0.54. She does have [name] history of diabetes mellitus type 2 and she has an A1c of 6.5 on 10-17-2021. Other than this she also does have chronic venous insufficiency her leg is swollen today I definitely think [name] compression wraps probably can help her she also has hypertension 12-29-2021 upon evaluation patient's wound is actually showing signs of excellent improvement. Fortunately I do not see any signs of infection I think she is making good progress here and overall I do believe that she is really looking quite nice as far as the overall appearance of the wound bed is concerned. There is [name] little bit of necrotic tissue noted on the surface of the wound that is going require debridement today. 12/28; continued improvement in the area on the left lateral leg which was initially [name] skin tear in the setting of chronic venous insufficiency. She has been using Prisma with 3 layer compression. The length of the wound is down 0.5 cm Objective Constitutional Patient is hypertensive.. Pulse regular and within target range for patient.Marland Kitchen Respirations regular, non-labored and within target range.. Temperature is normal and within the target range for the patient.Marland Kitchen appears in no distress. Vitals Time Taken: 12:45 PM, Weight: 206 lbs, Temperature: 98.2 F, Pulse: 82 bpm, Respiratory Rate: 18 breaths/min, Blood Pressure: 188/78 mmHg. General Notes: Wound exam; under illumination the wound still has some depth. No visible debris on the surface and I did not do any sharp debridement. No evidence of surrounding infection. Her edema control was quite adequate. Pedal pulses palpable Integumentary (Hair, Skin) Wound #1 status is Open. Original cause of wound was Trauma. The date acquired was: 08/12/2021. The wound has been in treatment  2 weeks. The wound is located on the Left,Lateral Lower Leg. The wound measures 3cm length x 1cm  width x 0.2cm depth; 2.356cm^2 area and 0.471cm^3 volume. There is Fat Layer (Subcutaneous Tissue) exposed. There is no tunneling or undermining noted. There is [name] medium amount of serosanguineous drainage noted. There is small (1- 33%) red, pink granulation within the wound bed. There is [name] large (67-100%) amount of necrotic tissue within the wound bed including Adherent Slough. Assessment Active Problems ICD-10 Type 2 diabetes mellitus with other skin ulcer Chronic venous hypertension (idiopathic) with ulcer and inflammation of left lower extremity Other specified peripheral vascular diseases Non-pressure chronic ulcer of other part of left lower leg with fat layer exposed Essential (primary) hypertension Plan Follow-up Appointments: Return Appointment in 1 week. Bathing/ Shower/ Hygiene: May shower; gently cleanse wound with antibacterial soap, rinse and pat dry prior to dressing wounds Anesthetic (Use 'Patient Medications' Section for Anesthetic Order Entry): Lidocaine applied to wound bed Edema Control - Lymphedema / Segmental Compressive Device / Other: Optional: One layer of unna paste to top of compression wrap (to act as an anchor). Courtney [name], Courtney [name] [name] ([id number]) [id number] Page 5 of 5 Elevate, Exercise Daily and Avoid Standing for Long Periods of Time. Elevate legs to the level of the heart and pump ankles as often as possible Elevate leg(s) parallel to the floor when sitting. WOUND #1: - Lower Leg Wound Laterality: Left, Lateral Prim Dressing: Prisma 4.34 (in) Every Other Day/30 Days ary Discharge Instructions: Moisten w/normal saline or sterile water; Cover wound as directed. Do not remove from wound bed. Secondary Dressing: ABD Pad 5x9 (in/in) Every Other Day/30 Days Discharge Instructions: Cover with ABD pad Com pression Wrap: 3-LAYER WRAP - Profore Lite LF 3 Multilayer Compression Bandaging System Every Other Day/30 Days Discharge Instructions:  Apply 3 multi-layer wrap as prescribed. 1. No new reason to change the primary dressing which is Prisma/ABDs under 3 layer compression. She is tolerating this well and she has good edema control 2. We are looking into compression stockings in the eventuality that this wound closes Electronic Signature(s) Signed: 01/05/2022 4:17:44 PM By: Linton Ham MD Entered By: Linton Ham on 01/05/2022 13:24:27 -------------------------------------------------------------------------------- SuperBill Details Patient Name: Date of Service: Lesia Sago 01/05/2022 Medical Record Number: [id number] Patient Account Number: [id number] Date of Birth/Sex: Treating RN: 20-Nov-1940 (81 y.o. Orvan Falconer Primary Care Provider: Emily Filbert Other Clinician: Referring Provider: Treating Provider/Extender: Eldridge Dace, MICHA EL Jolyn Nap in Treatment: 2 Diagnosis Coding ICD-10 Codes Code Description [phone] Type 2 diabetes mellitus with other skin ulcer I87.332 Chronic venous hypertension (idiopathic) with ulcer and inflammation of left lower extremity I73.89 Other specified peripheral vascular diseases L97.822 Non-pressure chronic ulcer of other part of left lower leg with fat layer exposed I10 Essential (primary) hypertension Facility Procedures : CPT4 Code: [id number] Description: 56433 - WOUND CARE VISIT-LEV 2 EST PT Modifier: Quantity: 1 Physician Procedures : CPT4 Code Description Modifier [id number] - WC PHYS LEVEL 3 - EST PT ICD-10 Diagnosis Description I87.332 Chronic venous hypertension (idiopathic) with ulcer and inflammation of left lower extremity L97.822 Non-pressure chronic ulcer of other part  of left lower leg with fat layer exposed E11.622 Type 2 diabetes mellitus with other skin ulcer Quantity: 1 Electronic Signature(s) Signed: 01/05/2022 4:17:44 PM By: Linton Ham MD Entered By: Linton Ham on 01/05/2022 13:24:50

## 2022-01-06 NOTE — Progress Notes (Signed)
GENELLA, BAS [name] ([id number]) [id number] Page 1 of 9 Visit Report for 01/05/2022 Arrival Information Details Patient Name: Date of Service: Courtney [name], Courtney [name] 01/05/2022 12:30 PM Medical Record Number: [id number] Patient Account Number: [id number] Date of Birth/Sex: Treating RN: 12-Dec-1940 (81 y.o. Courtney [name] Primary Care Courtney [name]: Emily Filbert Other Clinician: Referring Courtney [name]: Treating Allyse Fregeau/Extender: Eldridge Dace, MICHA EL Doristine Johns, Courtney [name] in Treatment: 2 Visit Information History Since Last Visit Added or deleted any medications: No Patient Arrived: Ambulatory Any new allergies or adverse reactions: No Arrival Time: 12:44 Had [name] fall or experienced change in No Accompanied By: daughter activities of daily living that may affect Transfer Assistance: None risk of falls: Patient Identification Verified: Yes Signs or symptoms of abuse/neglect since last visito No Secondary Verification Process Completed: Yes Hospitalized since last visit: No Patient Requires Transmission-Based Precautions: No Implantable device outside of the clinic excluding No Patient Has Alerts: Yes cellular tissue based products placed in the center Patient Alerts: ABI R1.02 TBI .63 12/09/21 since last visit: ABI L 1.02TBI .54 12/09/21 Has Dressing in Place as Prescribed: Yes Pain Present Now: No Electronic Signature(s) Signed: 01/06/2022 11:04:23 AM By: Carlene Coria RN Entered By: Carlene Coria on 01/05/2022 12:45:18 -------------------------------------------------------------------------------- Clinic Level of Care Assessment Details Patient Name: Date of Service: Courtney [name] 01/05/2022 12:30 PM Medical Record Number: [id number] Patient Account Number: [id number] Date of Birth/Sex: Treating RN: [birth year] (81 y.o. Courtney [name] Primary Care Courtney [name]: Emily Filbert Other Clinician: Referring Riyan Haile: Treating Courtney [name]/Extender: Eldridge Dace, MICHA EL Jolyn Nap in Treatment: 2 Clinic Level of Care Assessment Items TOOL 4 Quantity Score X- 1 0 Use when only an EandM is performed on FOLLOW-UP visit ASSESSMENTS - Nursing Assessment / Reassessment '[]'$  - 0 Reassessment of Co-morbidities (includes updates in patient status) '[]'$  - 0 Reassessment of Adherence to Treatment Plan Courtney, [name] [name] ([id number]) [id number] Page 2 of 9 ASSESSMENTS - Wound and Skin [name] ssessment / Reassessment X - Simple Wound Assessment / Reassessment - one wound 1 5 X- 1 5 Complex Wound Assessment / Reassessment - multiple wounds X- 1 10 Dermatologic / Skin Assessment (not related to wound area) ASSESSMENTS - Focused Assessment '[]'$  - 0 Circumferential Edema Measurements - multi extremities '[]'$  - 0 Nutritional Assessment / Counseling / Intervention '[]'$  - 0 Lower Extremity Assessment (monofilament, tuning fork, pulses) '[]'$  - 0 Peripheral Arterial Disease Assessment (using hand held doppler) ASSESSMENTS - Ostomy and/or Continence Assessment and Care '[]'$  - 0 Incontinence Assessment and Management '[]'$  - 0 Ostomy Care Assessment and Management (repouching, etc.) PROCESS - Coordination of Care X - Simple Patient / Family Education for ongoing care 1 15 '[]'$  - 0 Complex (extensive) Patient / Family Education for ongoing care '[]'$  - 0 Staff obtains Programmer, systems, Records, T Results / Process Orders est '[]'$  - 0 Staff telephones HHA, Nursing Homes / Clarify orders / etc '[]'$  - 0 Routine Transfer to another Facility (non-emergent condition) '[]'$  - 0 Routine Hospital Admission (non-emergent condition) '[]'$  - 0 New Admissions / Biomedical engineer / Ordering NPWT Apligraf, etc. , '[]'$  - 0 Emergency Hospital Admission (emergent condition) X- 1 10 Simple Discharge Coordination '[]'$  - 0 Complex (extensive) Discharge Coordination PROCESS - Special Needs '[]'$  - 0 Pediatric / Minor Patient Management '[]'$  - 0 Isolation Patient Management '[]'$  - 0 Hearing /  Language / Visual special needs '[]'$  - 0 Assessment of Community assistance (transportation, D/C planning, etc.) '[]'$  - 0 Additional assistance / Altered mentation '[]'$  -  0 Support Surface(s) Assessment (bed, cushion, seat, etc.) INTERVENTIONS - Wound Cleansing / Measurement X - Simple Wound Cleansing - one wound 1 5 '[]'$  - 0 Complex Wound Cleansing - multiple wounds X- 1 5 Wound Imaging (photographs - any number of wounds) '[]'$  - 0 Wound Tracing (instead of photographs) X- 1 5 Simple Wound Measurement - one wound '[]'$  - 0 Complex Wound Measurement - multiple wounds INTERVENTIONS - Wound Dressings X - Small Wound Dressing one or multiple wounds 1 10 '[]'$  - 0 Medium Wound Dressing one or multiple wounds '[]'$  - 0 Large Wound Dressing one or multiple wounds '[]'$  - 0 Application of Medications - topical '[]'$  - 0 Application of Medications - injection INTERVENTIONS - Miscellaneous '[]'$  - 0 External ear exam Courtney [name], Courtney [name] ([id number]) [id number] Page 3 of 9 '[]'$  - 0 Specimen Collection (cultures, biopsies, blood, body fluids, etc.) '[]'$  - 0 Specimen(s) / Culture(s) sent or taken to Lab for analysis '[]'$  - 0 Patient Transfer (multiple staff / Harrel Lemon Lift / Similar devices) '[]'$  - 0 Simple Staple / Suture removal (25 or less) '[]'$  - 0 Complex Staple / Suture removal (26 or more) '[]'$  - 0 Hypo / Hyperglycemic Management (close monitor of Blood Glucose) '[]'$  - 0 Ankle / Brachial Index (ABI) - do not check if billed separately X- 1 5 Vital Signs Has the patient been seen at the hospital within the last three years: Yes Total Score: 75 Level Of Care: New/Established - Level 2 Electronic Signature(s) Signed: 01/06/2022 11:04:23 AM By: Carlene Coria RN Entered By: Carlene Coria on 01/05/2022 13:07:09 -------------------------------------------------------------------------------- Encounter Discharge Information Details Patient Name: Date of Service: Courtney [name]. 01/05/2022  12:30 PM Medical Record Number: [id number] Patient Account Number: [id number] Date of Birth/Sex: Treating RN: Jul 11, 1940 (81 y.o. Courtney [name] Primary Care Quinterrius Errington: Emily Filbert Other Clinician: Referring Jeniah Kishi: Treating Dayton Kenley/Extender: RO BSO Delane Ginger, MICHA EL Jolyn Nap in Treatment: 2 Encounter Discharge Information Items Discharge Condition: Stable Ambulatory Status: Ambulatory Discharge Destination: Home Transportation: Private Auto Accompanied By: daughter Schedule Follow-up Appointment: Yes Clinical Summary of Care: Electronic Signature(s) Signed: 01/06/2022 11:04:23 AM By: Carlene Coria RN Entered By: Carlene Coria on 01/05/2022 13:09:05 -------------------------------------------------------------------------------- Lower Extremity Assessment Details Patient Name: Date of Service: Courtney [name], Courtney [name] 01/05/2022 12:30 PM Courtney [name] ([id number]) [id number] Page 4 of 9 Medical Record Number: [id number] Patient Account Number: [id number] Date of Birth/Sex: Treating RN: 1940/12/24 (81 y.o. Courtney [name] Primary Care Katharine Rochefort: Emily Filbert Other Clinician: Referring Sage Kopera: Treating Lianah Peed/Extender: RO BSO N, MICHA EL Doristine Johns, Courtney [name] in Treatment: 2 Edema Assessment Assessed: [Left: No] [Right: No] Edema: [Left: Ye] [Right: s] Calf Left: Right: Point of Measurement: 33 cm From Medial Instep 36 cm Ankle Left: Right: Point of Measurement: 10 cm From Medial Instep 25 cm Knee To Floor Left: Right: From Medial Instep 46 cm Electronic Signature(s) Signed: 01/06/2022 11:04:23 AM By: Carlene Coria RN Entered By: Carlene Coria on 01/05/2022 13:01:35 -------------------------------------------------------------------------------- Multi Wound Chart Details Patient Name: Date of Service: Courtney [name]. 01/05/2022 12:30 PM Medical Record Number: [id number] Patient Account Number: [id number] Date of Birth/Sex: Treating  RN: 1940/09/07 (81 y.o. Courtney [name] Primary Care Janelly Switalski: Emily Filbert Other Clinician: Referring Lamarcus Spira: Treating Reyanna Baley/Extender: RO BSO N, MICHA EL Doristine Johns, Courtney [name] in Treatment: 2 Vital Signs Height(in): Pulse(bpm): 82 Weight(lbs): 206 Blood Pressure(mmHg): 188/78 Body Mass Index(BMI): Temperature(F): 98.2 Respiratory Rate(breaths/min): 18 [1:Photos:] [N/[name]:N/[name]] Left, Lateral Lower Leg N/[name] N/[name] Wound Location: Trauma N/[name] N/[name] Wounding  Event: Diabetic Wound/Ulcer of the Lower N/[name] N/[name] Primary Etiology: Extremity Hypertension, Type II Diabetes N/[name] N/[name] Comorbid History: Courtney [name], Courtney [name] ([id number]) [id number] Page 5 of 9 08/12/2021 N/[name] N/[name] Date Acquired: 2 N/[name] N/[name] [name] of Treatment: Open N/[name] N/[name] Wound Status: No N/[name] N/[name] Wound Recurrence: 3x1x0.2 N/[name] N/[name] Measurements L x W x D (cm) 2.356 N/[name] N/[name] [name] (cm) : rea 0.471 N/[name] N/[name] Volume (cm) : 42.90% N/[name] N/[name] % Reduction in [name] rea: 71.40% N/[name] N/[name] % Reduction in Volume: Grade 1 N/[name] N/[name] Classification: Medium N/[name] N/[name] Exudate [name] mount: Serosanguineous N/[name] N/[name] Exudate Type: red, brown N/[name] N/[name] Exudate Color: Small (1-33%) N/[name] N/[name] Granulation [name] mount: Red, Pink N/[name] N/[name] Granulation Quality: Large (67-100%) N/[name] N/[name] Necrotic [name] mount: Fat Layer (Subcutaneous Tissue): Yes N/[name] N/[name] Exposed Structures: Fascia: No Tendon: No Muscle: No Joint: No Bone: No Treatment Notes Electronic Signature(s) Signed: 01/06/2022 11:04:23 AM By: Carlene Coria RN Entered By: Carlene Coria on 01/05/2022 13:01:40 -------------------------------------------------------------------------------- Multi-Disciplinary Care Plan Details Patient Name: Date of Service: Courtney [name]. 01/05/2022 12:30 PM Medical Record Number: [id number] Patient Account Number: [id number] Date of Birth/Sex: Treating RN: [birth year] (81 y.o. Courtney [name] Primary Care Jereld Presti: Emily Filbert Other Clinician: Referring  Reyansh Kushnir: Treating Deneene Tarver/Extender: Eldridge Dace, MICHA EL Doristine Johns, Courtney [name] in Treatment: 2 Active Inactive Necrotic Tissue Nursing Diagnoses: Knowledge deficit related to management of necrotic/devitalized tissue Goals: Patient/caregiver will verbalize understanding of reason and process for debridement of necrotic tissue Date Initiated: 12/22/2021 Target Resolution Date: 01/22/2022 Goal Status: Active Interventions: Assess patient pain level pre-, during and post procedure and prior to discharge Provide education on necrotic tissue and debridement process Notes: Wound/Skin Impairment Nursing Diagnoses: Knowledge deficit related to ulceration/compromised skin integrity GoalsSAMANVI, CUCCIA [name] ([id number]) [id number] Page 6 of 9 Patient/caregiver will verbalize understanding of skin care regimen Date Initiated: 12/22/2021 Target Resolution Date: 01/22/2022 Goal Status: Active Ulcer/skin breakdown will have [name] volume reduction of 30% by week 4 Date Initiated: 12/22/2021 Target Resolution Date: 01/22/2022 Goal Status: Active Ulcer/skin breakdown will have [name] volume reduction of 50% by week 8 Date Initiated: 12/22/2021 Target Resolution Date: 02/22/2022 Goal Status: Active Ulcer/skin breakdown will have [name] volume reduction of 80% by week 12 Date Initiated: 12/22/2021 Target Resolution Date: 03/23/2022 Goal Status: Active Ulcer/skin breakdown will heal within 14 [name] Date Initiated: 12/22/2021 Target Resolution Date: 04/23/2022 Goal Status: Active Interventions: Assess patient/caregiver ability to obtain necessary supplies Assess patient/caregiver ability to perform ulcer/skin care regimen upon admission and as needed Assess ulceration(s) every visit Notes: Electronic Signature(s) Signed: 01/06/2022 11:04:23 AM By: Carlene Coria RN Entered By: Carlene Coria on 01/05/2022  13:07:36 -------------------------------------------------------------------------------- Pain Assessment Details Patient Name: Date of Service: Courtney [name], Courtney [name] 01/05/2022 12:30 PM Medical Record Number: [id number] Patient Account Number: [id number] Date of Birth/Sex: Treating RN: [birth year] (81 y.o. Courtney [name] Primary Care Damere Brandenburg: Emily Filbert Other Clinician: Referring Brason Berthelot: Treating Guadalupe Kerekes/Extender: RO BSO Delane Ginger, MICHA EL Doristine Johns, Courtney [name] in Treatment: 2 Active Problems Location of Pain Severity and Description of Pain Patient Has Paino No Site Locations Pain Management and Medication Current Pain Management: Courtney [name], Courtney [name] ([id number]) [id number] Page 7 of 9 Electronic Signature(s) Signed: 01/06/2022 11:04:23 AM By: Carlene Coria RN Entered By: Carlene Coria on 01/05/2022 12:45:52 -------------------------------------------------------------------------------- Patient/Caregiver Education Details Patient Name: Date of Service: Courtney [name] 12/28/2023andnbsp12:30 PM Medical Record Number: [id number] Patient Account Number: [id number] Date of Birth/Gender: Treating RN: 1940/08/09 (81 y.o. Courtney [name] Primary Care Physician: Emily Filbert  Other Clinician: Referring Physician: Treating Physician/Extender: RO BSO N, MICHA EL Jolyn Nap in Treatment: 2 Education Assessment Education Provided To: Patient Education Topics Provided Wound/Skin Impairment: Methods: Explain/Verbal Responses: State content correctly Electronic Signature(s) Signed: 01/06/2022 11:04:23 AM By: Carlene Coria RN Entered By: Carlene Coria on 01/05/2022 13:07:28 -------------------------------------------------------------------------------- Wound Assessment Details Patient Name: Date of Service: Courtney [name] 01/05/2022 12:30 PM Medical Record Number: [id number] Patient Account Number: [id number] Date of Birth/Sex: Treating RN: Dec 26, 1940  (81 y.o. Courtney [name] Primary Care Hetvi Shawhan: Emily Filbert Other Clinician: Referring Adella Manolis: Treating Alizon Schmeling/Extender: RO BSO N, MICHA EL Doristine Johns, Courtney [name] in Treatment: 2 Wound Status Wound Number: 1 Primary Etiology: Diabetic Wound/Ulcer of the Lower Extremity Wound Location: Left, Lateral Lower Leg Wound Status: Open Wounding Event: Trauma Comorbid History: Hypertension, Type II Diabetes Date Acquired: 08/12/2021 [name] Of Treatment: 2 Courtney [name], Courtney [name] ([id number]) [id number] Page 8 of 9 Clustered Wound: No Photos Wound Measurements Length: (cm) 3 Width: (cm) 1 Depth: (cm) 0.2 Area: (cm) 2.356 Volume: (cm) 0.471 % Reduction in Area: 42.9% % Reduction in Volume: 71.4% Tunneling: No Undermining: No Wound Description Classification: Grade 1 Exudate Amount: Medium Exudate Type: Serosanguineous Exudate Color: red, brown Foul Odor After Cleansing: No Slough/Fibrino Yes Wound Bed Granulation Amount: Small (1-33%) Exposed Structure Granulation Quality: Red, Pink Fascia Exposed: No Necrotic Amount: Large (67-100%) Fat Layer (Subcutaneous Tissue) Exposed: Yes Necrotic Quality: Adherent Slough Tendon Exposed: No Muscle Exposed: No Joint Exposed: No Bone Exposed: No Treatment Notes Wound #1 (Lower Leg) Wound Laterality: Left, Lateral Cleanser Peri-Wound Care Topical Primary Dressing Prisma 4.34 (in) Discharge Instruction: Moisten w/normal saline or sterile water; Cover wound as directed. Do not remove from wound bed. Secondary Dressing ABD Pad 5x9 (in/in) Discharge Instruction: Cover with ABD pad Secured With Compression Wrap 3-LAYER WRAP - Profore Lite LF 3 Multilayer Compression Bandaging System Discharge Instruction: Apply 3 multi-layer wrap as prescribed. Compression Stockings Add-Ons Electronic Signature(s) Signed: 01/06/2022 11:04:23 AM By: Carlene Coria RN Entered By: Carlene Coria on 01/05/2022 12:56:36 Courtney [name]  ([id number]) [id number] Page 9 of 9 -------------------------------------------------------------------------------- Vitals Details Patient Name: Date of Service: Courtney [name], Courtney [name] 01/05/2022 12:30 PM Medical Record Number: [id number] Patient Account Number: [id number] Date of Birth/Sex: Treating RN: 11/15/1940 (81 y.o. Courtney [name] Primary Care Bertel Venard: Emily Filbert Other Clinician: Referring Ermine Spofford: Treating Aydyn Testerman/Extender: RO BSO N, MICHA EL Doristine Johns, Courtney [name] in Treatment: 2 Vital Signs Time Taken: 12:45 Temperature (F): 98.2 Weight (lbs): 206 Pulse (bpm): 82 Respiratory Rate (breaths/min): 18 Blood Pressure (mmHg): 188/78 Reference Range: 80 - 120 mg / dl Electronic Signature(s) Signed: 01/06/2022 11:04:23 AM By: Carlene Coria RN Entered By: Carlene Coria on 01/05/2022 12:45:46

## 2022-01-12 ENCOUNTER — Encounter: Payer: Medicare Other | Attending: Physician Assistant | Admitting: Physician Assistant

## 2022-01-12 DIAGNOSIS — E1151 Type 2 diabetes mellitus with diabetic peripheral angiopathy without gangrene: Secondary | ICD-10-CM | POA: Insufficient documentation

## 2022-01-12 DIAGNOSIS — I87332 Chronic venous hypertension (idiopathic) with ulcer and inflammation of left lower extremity: Secondary | ICD-10-CM | POA: Diagnosis not present

## 2022-01-12 DIAGNOSIS — L97822 Non-pressure chronic ulcer of other part of left lower leg with fat layer exposed: Secondary | ICD-10-CM | POA: Insufficient documentation

## 2022-01-12 DIAGNOSIS — E11622 Type 2 diabetes mellitus with other skin ulcer: Secondary | ICD-10-CM | POA: Insufficient documentation

## 2022-01-12 DIAGNOSIS — I10 Essential (primary) hypertension: Secondary | ICD-10-CM | POA: Insufficient documentation

## 2022-01-12 DIAGNOSIS — Z09 Encounter for follow-up examination after completed treatment for conditions other than malignant neoplasm: Secondary | ICD-10-CM | POA: Diagnosis not present

## 2022-01-13 NOTE — Progress Notes (Signed)
Courtney [name], Courtney [name] ([id number]) [id number] Page 1 of 9 Visit Report for 01/12/2022 Arrival Information Details Patient Name: Date of Service: Courtney [name], Courtney [name] 01/12/2022 12:45 PM Medical Record Number: [id number] Patient Account Number: [id number] Date of Birth/Sex: Treating RN: [birth year] (82 y.o. Courtney [name] Primary Care Courtney [name]: Courtney [name] Other Clinician: Referring Courtney [name]: Treating Courtney [name]/Extender: Courtney [name] in Treatment: 3 Visit Information History Since Last Visit Added or deleted any medications: No Patient Arrived: Courtney [name] Any new allergies or adverse reactions: No Arrival Time: 12:38 Had [name] fall or experienced change in No Accompanied By: husband activities of daily living that may affect Transfer Assistance: None risk of falls: Patient Identification Verified: Yes Signs or symptoms of abuse/neglect since last visito No Secondary Verification Process Completed: Yes Hospitalized since last visit: No Patient Requires Transmission-Based Precautions: No Implantable device outside of the clinic excluding No Patient Has Alerts: Yes cellular tissue based products placed in the center Patient Alerts: ABI R1.02 TBI .63 12/09/21 since last visit: ABI L 1.02TBI .54 12/09/21 Has Dressing in Place as Prescribed: Yes Has Compression in Place as Prescribed: Yes Pain Present Now: No Electronic Signature(s) Signed: 01/12/2022 5:05:00 PM By: Courtney Coria RN Entered By: Courtney [name] on 01/12/2022 12:43:28 -------------------------------------------------------------------------------- Clinic Level of Care Assessment Details Patient Name: Date of Service: Courtney [name], Courtney [name] 01/12/2022 12:45 PM Medical Record Number: [id number] Patient Account Number: [id number] Date of Birth/Sex: Treating RN: 1940/07/31 (82 y.o. Courtney [name] Primary Care Courtney [name]: Courtney [name] Other Clinician: Referring Courtney [name]: Treating Courtney [name]/Extender: Courtney [name] in Treatment: 3 Clinic Level of Care Assessment Items TOOL 1 Quantity Score '[]'$  - 0 Use when EandM and Procedure is performed on INITIAL visit ASSESSMENTS - Nursing Assessment / Reassessment '[]'$  - 0 General Physical Exam (combine w/ comprehensive assessment (listed just below) when performed on new pt. evals) '[]'$  - 0 Comprehensive Assessment (HX, ROS, Risk Assessments, Wounds Hx, etc.) Rabelo, Courtney [name] ([id number]) [id number] Page 2 of 9 ASSESSMENTS - Wound and Skin Assessment / Reassessment '[]'$  - 0 Dermatologic / Skin Assessment (not related to wound area) ASSESSMENTS - Ostomy and/or Continence Assessment and Care '[]'$  - 0 Incontinence Assessment and Management '[]'$  - 0 Ostomy Care Assessment and Management (repouching, etc.) PROCESS - Coordination of Care '[]'$  - 0 Simple Patient / Family Education for ongoing care '[]'$  - 0 Complex (extensive) Patient / Family Education for ongoing care '[]'$  - 0 Staff obtains Programmer, systems, Records, T Results / Process Orders est '[]'$  - 0 Staff telephones HHA, Nursing Homes / Clarify orders / etc '[]'$  - 0 Routine Transfer to another Facility (non-emergent condition) '[]'$  - 0 Routine Hospital Admission (non-emergent condition) '[]'$  - 0 New Admissions / Biomedical engineer / Ordering NPWT Apligraf, etc. , '[]'$  - 0 Emergency Hospital Admission (emergent condition) PROCESS - Special Needs '[]'$  - 0 Pediatric / Minor Patient Management '[]'$  - 0 Isolation Patient Management '[]'$  - 0 Hearing / Language / Visual special needs '[]'$  - 0 Assessment of Community assistance (transportation, D/C planning, etc.) '[]'$  - 0 Additional assistance / Altered mentation '[]'$  - 0 Support Surface(s) Assessment (bed, cushion, seat, etc.) INTERVENTIONS - Miscellaneous '[]'$  - 0 External ear exam '[]'$  - 0 Patient Transfer (multiple staff / Civil Service fast streamer / Similar devices) '[]'$  - 0 Simple Staple / Suture removal (25 or less) '[]'$  - 0 Complex Staple  / Suture removal (26 or more) '[]'$  - 0 Hypo/Hyperglycemic Management (do not check if billed separately) '[]'$  - 0 Ankle / Brachial Index (ABI) -  do not check if billed separately Has the patient been seen at the hospital within the last three years: Yes Total Score: 0 Level Of Care: ____ Electronic Signature(s) Signed: 01/12/2022 5:05:00 PM By: Courtney Coria RN Entered By: Courtney [name] on 01/12/2022 13:06:38 -------------------------------------------------------------------------------- Encounter Discharge Information Details Patient Name: Date of Service: Courtney [name]. 01/12/2022 12:45 PM Medical Record Number: [id number] Patient Account Number: [id number] Date of Birth/Sex: Treating RN: 03-21-1940 (82 y.o. Courtney [name] Primary Care Kimothy Kishimoto: Courtney [name] Other Clinician: Referring [name]: Treating Shaelin Lalley/Extender: Bonne Dolores Tappan, Darrol Poke ([id number]) [id number] Page 3 of 9 Weeks in Treatment: 3 Encounter Discharge Information Items Post Procedure Vitals Discharge Condition: Stable Temperature (F): 97.7 Ambulatory Status: Ambulatory Pulse (bpm): 78 Discharge Destination: Home Respiratory Rate (breaths/min): 16 Transportation: Private Auto Blood Pressure (mmHg): 168/63 Accompanied By: self Schedule Follow-up Appointment: Yes Clinical Summary of Care: Electronic Signature(s) Signed: 01/12/2022 5:05:00 PM By: Courtney Coria RN Entered By: Courtney [name] on 01/12/2022 13:07:26 -------------------------------------------------------------------------------- Lower Extremity Assessment Details Patient Name: Date of Service: Courtney [name], Courtney [name] 01/12/2022 12:45 PM Medical Record Number: [id number] Patient Account Number: [id number] Date of Birth/Sex: Treating RN: 1941-01-04 (82 y.o. Courtney [name] Primary Care [name]: Courtney [name] Other Clinician: Referring [name]: Treating [name]/Extender: Courtney [name] in  Treatment: 3 Edema Assessment Assessed: [Left: No] [Right: No] Edema: [Left: Ye] [Right: s] Calf Left: Right: Point of Measurement: 32 cm From Medial Instep 36 cm Ankle Left: Right: Point of Measurement: 10 cm From Medial Instep 25 cm Vascular Assessment Pulses: Dorsalis Pedis Palpable: [Left:Yes] Electronic Signature(s) Signed: 01/12/2022 5:05:00 PM By: Courtney Coria RN Entered By: Courtney [name] on 01/12/2022 12:51:30 Courtney [name] ([id number]) [id number] Page 4 of 9 -------------------------------------------------------------------------------- Multi Wound Chart Details Patient Name: Date of Service: Courtney [name], Courtney [name] 01/12/2022 12:45 PM Medical Record Number: [id number] Patient Account Number: [id number] Date of Birth/Sex: Treating RN: 1940-06-20 (82 y.o. Courtney [name] Primary Care Dorthea Maina: Courtney [name] Other Clinician: Referring Pepe Mineau: Treating Shadow Stiggers/Extender: Courtney [name] in Treatment: 3 Vital Signs Height(in): Pulse(bpm): 78 Weight(lbs): 206 Blood Pressure(mmHg): 168/63 Body Mass Index(BMI): Temperature(F): 97.7 Respiratory Rate(breaths/min): 16 [1:Photos:] [N/[name]:N/[name]] Left, Lateral Lower Leg N/[name] N/[name] Wound Location: Trauma N/[name] N/[name] Wounding Event: Diabetic Wound/Ulcer of the Lower N/[name] N/[name] Primary Etiology: Extremity Hypertension, Type II Diabetes N/[name] N/[name] Comorbid History: 08/12/2021 N/[name] N/[name] Date Acquired: 3 N/[name] N/[name] Weeks of Treatment: Open N/[name] N/[name] Wound Status: No N/[name] N/[name] Wound Recurrence: 1.5x0.8x0.2 N/[name] N/[name] Measurements L x W x D (cm) 0.942 N/[name] N/[name] [name] (cm) : rea 0.188 N/[name] N/[name] Volume (cm) : 77.20% N/[name] N/[name] % Reduction in [name] rea: 88.60% N/[name] N/[name] % Reduction in Volume: Grade 1 N/[name] N/[name] Classification: Medium N/[name] N/[name] Exudate [name] mount: Serosanguineous N/[name] N/[name] Exudate Type: red, brown N/[name] N/[name] Exudate Color: Large (67-100%) N/[name] N/[name] Granulation [name] mount: Red, Pink N/[name] N/[name] Granulation  Quality: Small (1-33%) N/[name] N/[name] Necrotic [name] mount: Fat Layer (Subcutaneous Tissue): Yes N/[name] N/[name] Exposed Structures: Fascia: No Tendon: No Muscle: No Joint: No Bone: No Treatment Notes Electronic Signature(s) Signed: 01/12/2022 5:05:00 PM By: Courtney Coria RN Entered By: Courtney [name] on 01/12/2022 12:51:34 Celia, Anala [name] ([id number]) [id number] Page 5 of 9 -------------------------------------------------------------------------------- Multi-Disciplinary Care Plan Details Patient Name: Date of Service: Courtney [name], Courtney [name] 01/12/2022 12:45 PM Medical Record Number: [id number] Patient Account Number: [id number] Date of Birth/Sex: Treating RN: 06/04/1940 (82 y.o. Courtney [name] Primary Care Ajdin Macke: Courtney [name] Other Clinician: Referring Taytum Wheller: Treating Amadeo Coke/Extender: Jeri Cos  Courtney [name] Weeks in Treatment: 3 Active Inactive Necrotic Tissue Nursing Diagnoses: Knowledge deficit related to management of necrotic/devitalized tissue Goals: Patient/caregiver will verbalize understanding of reason and process for debridement of necrotic tissue Date Initiated: 12/22/2021 Target Resolution Date: 01/22/2022 Goal Status: Active Interventions: Assess patient pain level pre-, during and post procedure and prior to discharge Provide education on necrotic tissue and debridement process Notes: Wound/Skin Impairment Nursing Diagnoses: Knowledge deficit related to ulceration/compromised skin integrity Goals: Patient/caregiver will verbalize understanding of skin care regimen Date Initiated: 12/22/2021 Target Resolution Date: 01/22/2022 Goal Status: Active Ulcer/skin breakdown will have [name] volume reduction of 30% by week 4 Date Initiated: 12/22/2021 Target Resolution Date: 01/22/2022 Goal Status: Active Ulcer/skin breakdown will have [name] volume reduction of 50% by week 8 Date Initiated: 12/22/2021 Target Resolution Date: 02/22/2022 Goal Status:  Active Ulcer/skin breakdown will have [name] volume reduction of 80% by week 12 Date Initiated: 12/22/2021 Target Resolution Date: 03/23/2022 Goal Status: Active Ulcer/skin breakdown will heal within 14 weeks Date Initiated: 12/22/2021 Target Resolution Date: 04/23/2022 Goal Status: Active Interventions: Assess patient/caregiver ability to obtain necessary supplies Assess patient/caregiver ability to perform ulcer/skin care regimen upon admission and as needed Assess ulceration(s) every visit Notes: Electronic Signature(s) Signed: 01/12/2022 12:56:39 PM By: Courtney Coria RN Entered By: Courtney [name] on 01/12/2022 12:56:38 Courtney [name] ([id number]) [id number] Page 6 of 9 -------------------------------------------------------------------------------- Pain Assessment Details Patient Name: Date of Service: Courtney [name], Courtney [name] 01/12/2022 12:45 PM Medical Record Number: [id number] Patient Account Number: [id number] Date of Birth/Sex: Treating RN: 02-09-1940 (82 y.o. Courtney [name] Primary Care Gianelle Mccaul: Courtney [name] Other Clinician: Referring Mirza Fessel: Treating Caidon Foti/Extender: Courtney [name] in Treatment: 3 Active Problems Location of Pain Severity and Description of Pain Patient Has Paino No Site Locations Pain Management and Medication Current Pain Management: Electronic Signature(s) Signed: 01/12/2022 5:05:00 PM By: Courtney Coria RN Entered By: Courtney [name] on 01/12/2022 12:44:50 -------------------------------------------------------------------------------- Patient/Caregiver Education Details Patient Name: Date of Service: Courtney [name] 1/4/2024andnbsp12:45 PM Medical Record Number: [id number] Patient Account Number: [id number] Date of Birth/Gender: Treating RN: 1940-07-24 (82 y.o. Courtney [name] Primary Care Physician: Courtney [name] Other Clinician: Referring Physician: Treating Physician/Extender: Courtney [name] in  Treatment: 3 Courtney [name], Courtney [name] ([id number]) [id number] Page 7 of 9 Education Assessment Education Provided To: Patient Education Topics Provided Wound/Skin Impairment: Methods: Explain/Verbal Responses: State content correctly Electronic Signature(s) Signed: 01/12/2022 5:05:00 PM By: Courtney Coria RN Entered By: Courtney [name] on 01/12/2022 12:56:28 -------------------------------------------------------------------------------- Wound Assessment Details Patient Name: Date of Service: Courtney [name], Courtney [name] 01/12/2022 12:45 PM Medical Record Number: [id number] Patient Account Number: [id number] Date of Birth/Sex: Treating RN: 08-05-1940 (82 y.o. Courtney [name] Primary Care Brooklynne Pereida: Courtney [name] Other Clinician: Referring Terrius Gentile: Treating Neithan Day/Extender: Courtney [name] in Treatment: 3 Wound Status Wound Number: 1 Primary Etiology: Diabetic Wound/Ulcer of the Lower Extremity Wound Location: Left, Lateral Lower Leg Wound Status: Open Wounding Event: Trauma Comorbid History: Hypertension, Type II Diabetes Date Acquired: 08/12/2021 Weeks Of Treatment: 3 Clustered Wound: No Photos Wound Measurements Length: (cm) 1.5 Width: (cm) 0.8 Depth: (cm) 0.2 Area: (cm) 0.942 Volume: (cm) 0.188 % Reduction in Area: 77.2% % Reduction in Volume: 88.6% Tunneling: No Undermining: No Wound Description Classification: Grade 1 Exudate Amount: Medium Exudate Type: Serosanguineous Exudate Color: red, brown Vidales, Chanell [name] ([id number]) Foul Odor After Cleansing: No Slough/Fibrino Yes [id number] Page 8 of 9 Wound Bed Granulation Amount: Large (67-100%) Exposed Structure Granulation Quality: Red, Pink Fascia Exposed: No Necrotic Amount: Small (  1-33%) Fat Layer (Subcutaneous Tissue) Exposed: Yes Necrotic Quality: Adherent Slough Tendon Exposed: No Muscle Exposed: No Joint Exposed: No Bone Exposed: No Treatment Notes Wound  #1 (Lower Leg) Wound Laterality: Left, Lateral Cleanser Peri-Wound Care AandD Ointment Discharge Instruction: Apply AandD Ointment as directed Topical Primary Dressing Prisma 4.34 (in) Discharge Instruction: Moisten w/normal saline or sterile water; Cover wound as directed. Do not remove from wound bed. Secondary Dressing ABD Pad 5x9 (in/in) Discharge Instruction: Cover with ABD pad Secured With Compression Wrap 3-LAYER WRAP - Profore Lite LF 3 Multilayer Compression Bandaging System Discharge Instruction: Apply 3 multi-layer wrap as prescribed. Compression Stockings Add-Ons Electronic Signature(s) Signed: 01/12/2022 5:05:00 PM By: Courtney Coria RN Entered By: Courtney [name] on 01/12/2022 12:50:48 -------------------------------------------------------------------------------- Vitals Details Patient Name: Date of Service: Courtney [name]. 01/12/2022 12:45 PM Medical Record Number: [id number] Patient Account Number: [id number] Date of Birth/Sex: Treating RN: [birth year] (82 y.o. Courtney [name] Primary Care Kaylon Hitz: Courtney [name] Other Clinician: Referring Cire Deyarmin: Treating Micala Saltsman/Extender: Courtney [name] in Treatment: 3 Vital Signs Time Taken: 12:44 Temperature (F): 97.7 Weight (lbs): 206 Pulse (bpm): 78 Respiratory Rate (breaths/min): 16 Blood Pressure (mmHg): 168/63 Reference Range: 80 - 120 mg / dl Cudworth, Kalila [name] ([id number]) [id number] Page 9 of 9 Electronic Signature(s) Signed: 01/12/2022 5:05:00 PM By: Courtney Coria RN Entered By: Courtney [name] on 01/12/2022 12:44:35

## 2022-01-13 NOTE — Progress Notes (Signed)
Courtney [name], Courtney [name] ([id number]) [id number] Page 1 of 7 Visit Report for 01/12/2022 Chief Complaint Document Details Patient Name: Date of Service: Courtney [name] 01/12/2022 12:45 PM Medical Record Number: [id number] Patient Account Number: [id number] Date of Birth/Sex: Treating RN: 11/21/1940 (82 y.o. Courtney [name] Primary Care Provider: Emily Filbert Other Clinician: Referring Provider: Treating Provider/Extender: Leatha Gilding in Treatment: 3 Information Obtained from: Patient Chief Complaint Left LE Ulcer Electronic Signature(s) Signed: 01/12/2022 1:02:09 PM By: Worthy Keeler PA-C Entered By: Worthy Keeler on 01/12/2022 13:02:09 -------------------------------------------------------------------------------- Debridement Details Patient Name: Date of Service: Courtney [name]. 01/12/2022 12:45 PM Medical Record Number: [id number] Patient Account Number: [id number] Date of Birth/Sex: Treating RN: 1940/06/23 (82 y.o. Courtney [name] Primary Care Provider: Emily Filbert Other Clinician: Referring Provider: Treating Provider/Extender: Leatha Gilding in Treatment: 3 Debridement Performed for Assessment: Wound #1 Left,Lateral Lower Leg Performed By: Physician Tommie Sams., PA-C Debridement Type: Debridement Severity of Tissue Pre Debridement: Fat layer exposed Level of Consciousness (Pre-procedure): Awake and Alert Pre-procedure Verification/Time Out Yes - 13:00 Taken: Start Time: 13:00 Pain Control: Lidocaine 4% T opical Solution T Area Debrided (L x W): otal 1.5 (cm) x 0.8 (cm) = 1.2 (cm) Tissue and other material debrided: Subcutaneous Level: Skin/Subcutaneous Tissue Debridement Description: Excisional Instrument: Curette Bleeding: Minimum Hemostasis Achieved: Pressure End Time: 13:06 Procedural Pain: 0 Post Procedural Pain: 0 Ratterree, Courtney [name] ([id number]) [id number] Page 2 of  7 Response to Treatment: Procedure was tolerated well Level of Consciousness (Post- Awake and Alert procedure): Post Debridement Measurements of Total Wound Length: (cm) 1.5 Width: (cm) 0.8 Depth: (cm) 0.2 Volume: (cm) 0.188 Character of Wound/Ulcer Post Debridement: Improved Severity of Tissue Post Debridement: Fat layer exposed Post Procedure Diagnosis Same as Pre-procedure Electronic Signature(s) Signed: 01/12/2022 5:05:00 PM By: Courtney Coria RN Signed: 01/13/2022 1:38:11 PM By: Worthy Keeler PA-C Entered By: Courtney [name] on 01/12/2022 13:06:32 -------------------------------------------------------------------------------- HPI Details Patient Name: Date of Service: Courtney [name]. 01/12/2022 12:45 PM Medical Record Number: [id number] Patient Account Number: [id number] Date of Birth/Sex: Treating RN: [birth year] (82 y.o. Courtney [name] Primary Care Provider: Emily Filbert Other Clinician: Referring Provider: Treating Provider/Extender: Leatha Gilding in Treatment: 3 History of Present Illness HPI Description: 12-22-2021 upon evaluation today patient appears to be doing well currently in regard to her wound all things considered especially in light of the picture that they showed me from August when she initially had [name] skin tear. Fortunately there does not appear to be any signs of active infection locally nor systemically at this time which is great news and overall I am extremely pleased with the fact that she is making some progress here she has been placed on Cipro due to [name] culture result that was done by rheumatologist which showed that she had Pseudomonas and Staphylococcus aureus noted as the organisms in question. Fortunately I do not think that there is anything that seems to be still present or significant here I think she is actually doing much better which is great news. With that being said the patient tells me that she has been using Medihoney at this  point which actually is really good does help to clear up [name] lot of this I do believe. In the beginning she was also placed on or rather in an Unna boot wrap based on what we are being told that was for [name] couple of weeks and then she did not have any additional  follow-up with primary eventually rheumatology who were doing injections for her realize that she had [name] wound they stopped the injections which obviously would lower her immune response and healing chances. That is when she started on the antibiotics and subsequently also was referred to vascular. The good news is from [name] vascular standpoint she seems to be probably sufficient to heal with [name] right ABI of 1.02 and [name] left ABI of 1.02 as well. Her TBI on the right was 0.63 and on the left 0.54. She does have [name] history of diabetes mellitus type 2 and she has an A1c of 6.5 on 10-17-2021. Other than this she also does have chronic venous insufficiency her leg is swollen today I definitely think [name] compression wraps probably can help her she also has hypertension 12-29-2021 upon evaluation patient's wound is actually showing signs of excellent improvement. Fortunately I do not see any signs of infection I think she is making good progress here and overall I do believe that she is really looking quite nice as far as the overall appearance of the wound bed is concerned. There is [name] little bit of necrotic tissue noted on the surface of the wound that is going require debridement today. 12/28; continued improvement in the area on the left lateral leg which was initially [name] skin tear in the setting of chronic venous insufficiency. She has been using Prisma with 3 layer compression. The length of the wound is down 0.5 cm 01-12-2022 upon evaluation today patient appears to be doing well currently in regard to her leg ulcer. She has been tolerating dressing changes without complication. Fortunately I do not see any signs of active infection locally nor systemically  which is great news. No fevers, chills, nausea, vomiting, or diarrhea. Electronic Signature(s) Signed: 01/13/2022 8:03:50 AM By: Worthy Keeler PA-C Entered By: Worthy Keeler on 01/13/2022 08:03:49 [name] ([id number]) [id number] Page 3 of 7 -------------------------------------------------------------------------------- Physical Exam Details Patient Name: Date of Service: Courtney [name], Courtney [name] 01/12/2022 12:45 PM Medical Record Number: [id number] Patient Account Number: [id number] Date of Birth/Sex: Treating RN: April 18, 1940 (82 y.o. Courtney [name] Primary Care Provider: Emily Filbert Other Clinician: Referring Provider: Treating Provider/Extender: Leatha Gilding in Treatment: 3 Constitutional Well-nourished and well-hydrated in no acute distress. Respiratory normal breathing without difficulty. Psychiatric this patient is able to make decisions and demonstrates good insight into disease process. Alert and Oriented x 3. pleasant and cooperative. Notes Upon inspection patient's wound showed signs again of improvement which is great news and overall I am extremely pleased in that regard working to see how things continue to progress. Electronic Signature(s) Signed: 01/13/2022 8:05:18 AM By: Worthy Keeler PA-C Entered By: Worthy Keeler on 01/13/2022 08:05:18 -------------------------------------------------------------------------------- Physician Orders Details Patient Name: Date of Service: Courtney Adan Sis [name]. 01/12/2022 12:45 PM Medical Record Number: [id number] Patient Account Number: [id number] Date of Birth/Sex: Treating RN: Apr 13, 1940 (82 y.o. Courtney [name] Primary Care Provider: Emily Filbert Other Clinician: Referring Provider: Treating Provider/Extender: Leatha Gilding in Treatment: 3 Verbal / Phone Orders: No Diagnosis Coding Follow-up Appointments Return Appointment in 1 week. Bathing/ Shower/  Hygiene May shower; gently cleanse wound with antibacterial soap, rinse and pat dry prior to dressing wounds Anesthetic (Use 'Patient Medications' Section for Anesthetic Order Entry) Lidocaine applied to wound bed Edema Control - Lymphedema / Segmental Compressive Device / Other Optional: One layer of unna paste to top of compression wrap (to act as an anchor). Courtney [name], Courtney [name] ([id number]) [id number] Page 4 of 7 Elevate, Exercise Daily and [name] void Standing for Long Periods of Time. Elevate legs to the level of the heart and pump ankles as often as possible Elevate leg(s) parallel to the floor when sitting. Wound Treatment Wound #1 - Lower Leg Wound Laterality: Left, Lateral Peri-Wound Care: AandD Ointment Every Other Day/30 Days Discharge Instructions: Apply AandD Ointment as directed Prim Dressing: Prisma 4.34 (in) Every Other Day/30 Days ary Discharge Instructions: Moisten w/normal saline or sterile water; Cover wound as directed. Do not remove from wound bed. Secondary Dressing: ABD Pad 5x9 (in/in) Every Other Day/30 Days Discharge Instructions: Cover with ABD pad Compression Wrap: 3-LAYER WRAP - Profore Lite LF 3 Multilayer Compression Bandaging System Every Other Day/30 Days Discharge Instructions: Apply 3 multi-layer wrap as prescribed. Electronic Signature(s) Signed: 01/12/2022 5:05:00 PM By: Courtney Coria RN Signed: 01/13/2022 1:38:11 PM By: Worthy Keeler PA-C Previous Signature: 01/12/2022 12:56:16 PM Version By: Courtney Coria RN Entered By: Courtney [name] on 01/12/2022 13:05:30 -------------------------------------------------------------------------------- Problem List Details Patient Name: Date of Service: Courtney [name]. 01/12/2022 12:45 PM Medical Record Number: [id number] Patient Account Number: [id number] Date of Birth/Sex: Treating RN: 1940/07/28 (82 y.o. Courtney [name] Primary Care Provider: Emily Filbert Other Clinician: Referring  Provider: Treating Provider/Extender: Leatha Gilding in Treatment: 3 Active Problems ICD-10 Encounter Code Description Active Date MDM Diagnosis E11.622 Type 2 diabetes mellitus with other skin ulcer 12/22/2021 No Yes I87.332 Chronic venous hypertension (idiopathic) with ulcer and inflammation of left 12/22/2021 No Yes lower extremity I73.89 Other specified peripheral vascular diseases 12/22/2021 No Yes L97.822 Non-pressure chronic ulcer of other part of left lower leg with fat layer 12/22/2021 No Yes exposed [place] (primary) hypertension 12/22/2021 No Yes Courtney [name], Courtney [name] ([id number]) [id number] Page 5 of 7 Inactive Problems Resolved Problems Electronic Signature(s) Signed: 01/12/2022 1:02:03 PM By: Worthy Keeler PA-C Entered By: Worthy Keeler on 01/12/2022 13:02:02 -------------------------------------------------------------------------------- Progress Note Details Patient Name: Date of Service: Courtney Adan Sis [name]. 01/12/2022 12:45 PM Medical Record Number: [id number] Patient Account Number: [id number] Date of Birth/Sex: Treating RN: 12-Oct-1940 (82 y.o. Courtney [name] Primary Care Provider: Emily Filbert Other Clinician: Referring Provider: Treating Provider/Extender: Leatha Gilding in Treatment: 3 Subjective Chief Complaint Information obtained from Patient Left LE Ulcer History of Present Illness (HPI) 12-22-2021 upon evaluation today patient appears to be doing well currently in regard to her wound all things considered especially in light of the picture that they showed me from August when she initially had [name] skin tear. Fortunately there does not appear to be any signs of active infection locally nor systemically at this time which is great news and overall I am extremely pleased with the fact that she is making some progress here she has been placed on Cipro due to [name] culture result that was done by  rheumatologist which showed that she had Pseudomonas and Staphylococcus aureus noted as the organisms in question. Fortunately I do not think that there is anything that seems to be still present or significant here I think she is actually doing much better which is great news. With that being said the patient tells me that she has been using Medihoney at this point which actually is really good does help to clear up [name] lot of this I do believe. In the beginning she was also placed on or rather in an Unna boot wrap based on what we are being told that was for [name] couple  of weeks and then she did not have any additional follow-up with primary eventually rheumatology who were doing injections for her realize that she had [name] wound they stopped the injections which obviously would lower her immune response and healing chances. That is when she started on the antibiotics and subsequently also was referred to vascular. The good news is from [name] vascular standpoint she seems to be probably sufficient to heal with [name] right ABI of 1.02 and [name] left ABI of 1.02 as well. Her TBI on the right was 0.63 and on the left 0.54. She does have [name] history of diabetes mellitus type 2 and she has an A1c of 6.5 on 10-17-2021. Other than this she also does have chronic venous insufficiency her leg is swollen today I definitely think [name] compression wraps probably can help her she also has hypertension 12-29-2021 upon evaluation patient's wound is actually showing signs of excellent improvement. Fortunately I do not see any signs of infection I think she is making good progress here and overall I do believe that she is really looking quite nice as far as the overall appearance of the wound bed is concerned. There is [name] little bit of necrotic tissue noted on the surface of the wound that is going require debridement today. 12/28; continued improvement in the area on the left lateral leg which was initially [name] skin tear in the setting of  chronic venous insufficiency. She has been using Prisma with 3 layer compression. The length of the wound is down 0.5 cm 01-12-2022 upon evaluation today patient appears to be doing well currently in regard to her leg ulcer. She has been tolerating dressing changes without complication. Fortunately I do not see any signs of active infection locally nor systemically which is great news. No fevers, chills, nausea, vomiting, or diarrhea. Objective Constitutional Well-nourished and well-hydrated in no acute distress. Vitals Time Taken: 12:44 PM, Weight: 206 lbs, Temperature: 97.7 F, Pulse: 78 bpm, Respiratory Rate: 16 breaths/min, Blood Pressure: 168/63 mmHg. Respiratory Courtney [name], Courtney [name] ([id number]) [id number] Page 6 of 7 normal breathing without difficulty. Psychiatric this patient is able to make decisions and demonstrates good insight into disease process. Alert and Oriented x 3. pleasant and cooperative. General Notes: Upon inspection patient's wound showed signs again of improvement which is great news and overall I am extremely pleased in that regard working to see how things continue to progress. Integumentary (Hair, Skin) Wound #1 status is Open. Original cause of wound was Trauma. The date acquired was: 08/12/2021. The wound has been in treatment 3 weeks. The wound is located on the Left,Lateral Lower Leg. The wound measures 1.5cm length x 0.8cm width x 0.2cm depth; 0.942cm^2 area and 0.188cm^3 volume. There is Fat Layer (Subcutaneous Tissue) exposed. There is no tunneling or undermining noted. There is [name] medium amount of serosanguineous drainage noted. There is large (67-100%) red, pink granulation within the wound bed. There is [name] small (1-33%) amount of necrotic tissue within the wound bed including Adherent Slough. Assessment Active Problems ICD-10 Type 2 diabetes mellitus with other skin ulcer Chronic venous hypertension (idiopathic) with ulcer and  inflammation of left lower extremity Other specified peripheral vascular diseases Non-pressure chronic ulcer of other part of left lower leg with fat layer exposed Essential (primary) hypertension Procedures Wound #1 Pre-procedure diagnosis of Wound #1 is [name] Diabetic Wound/Ulcer of the Lower Extremity located on the Left,Lateral Lower Leg .Severity of Tissue Pre Debridement is: Fat layer exposed. There was [name] Excisional Skin/Subcutaneous Tissue Debridement with  [name] total area of 1.2 sq cm performed by Tommie Sams., PA-C. With the following instrument(s): Curette Material removed includes Subcutaneous Tissue after achieving pain control using Lidocaine 4% T opical Solution. No specimens were taken. [name] time out was conducted at 13:00, prior to the start of the procedure. [name] Minimum amount of bleeding was controlled with Pressure. The procedure was tolerated well with [name] pain level of 0 throughout and [name] pain level of 0 following the procedure. Post Debridement Measurements: 1.5cm length x 0.8cm width x 0.2cm depth; 0.188cm^3 volume. Character of Wound/Ulcer Post Debridement is improved. Severity of Tissue Post Debridement is: Fat layer exposed. Post procedure Diagnosis Wound #1: Same as Pre-Procedure Plan Follow-up Appointments: Return Appointment in 1 week. Bathing/ Shower/ Hygiene: May shower; gently cleanse wound with antibacterial soap, rinse and pat dry prior to dressing wounds Anesthetic (Use 'Patient Medications' Section for Anesthetic Order Entry): Lidocaine applied to wound bed Edema Control - Lymphedema / Segmental Compressive Device / Other: Optional: One layer of unna paste to top of compression wrap (to act as an anchor). Elevate, Exercise Daily and Avoid Standing for Long Periods of Time. Elevate legs to the level of the heart and pump ankles as often as possible Elevate leg(s) parallel to the floor when sitting. WOUND #1: - Lower Leg Wound Laterality: Left, Lateral Peri-Wound Care:  AandD Ointment Every Other Day/30 Days Discharge Instructions: Apply AandD Ointment as directed Prim Dressing: Prisma 4.34 (in) Every Other Day/30 Days ary Discharge Instructions: Moisten w/normal saline or sterile water; Cover wound as directed. Do not remove from wound bed. Secondary Dressing: ABD Pad 5x9 (in/in) Every Other Day/30 Days Discharge Instructions: Cover with ABD pad Com pression Wrap: 3-LAYER WRAP - Profore Lite LF 3 Multilayer Compression Bandaging System Every Other Day/30 Days Discharge Instructions: Apply 3 multi-layer wrap as prescribed. 1. I would recommend that we continue currently with the wound care measures as before specifically in regard to the 3 layer compression wrapping which I think is doing quite well. 2. I am also going to recommend based on what we are seeing that we continue with the silver collagen followed by the ABD pad which is doing excellent. We will see patient back for reevaluation in 1 week here in the clinic. If anything worsens or changes patient will contact our office for additional recommendations. Electronic Signature(s) Signed: 01/13/2022 8:05:48 AM By: Worthy Keeler PA-C Courtney [name],Courtney [name] ([id number]) AM By: Worthy Keeler PA-C [place] 01/13/2022 8:05:48 [id number] Page 7 of 7 Entered By: Worthy Keeler on 01/13/2022 08:05:48 -------------------------------------------------------------------------------- SuperBill Details Patient Name: Date of Service: Courtney [name], Courtney [name] 01/12/2022 Medical Record Number: [id number] Patient Account Number: [id number] Date of Birth/Sex: Treating RN: [birth year] (81 y.o. Courtney [name] Primary Care Provider: Emily Filbert Other Clinician: Referring Provider: Treating Provider/Extender: Leatha Gilding in Treatment: 3 Diagnosis Coding ICD-10 Codes Code Description [phone] Type 2 diabetes mellitus with other skin ulcer I87.332 Chronic venous hypertension  (idiopathic) with ulcer and inflammation of left lower extremity I73.89 Other specified peripheral vascular diseases L97.822 Non-pressure chronic ulcer of other part of left lower leg with fat layer exposed I10 Essential (primary) hypertension Facility Procedures : CPT4 Code: [id number] Description: 85631 - DEB SUBQ TISSUE 20 SQ CM/< ICD-10 Diagnosis Description L97.822 Non-pressure chronic ulcer of other part of left lower leg with fat layer expo Modifier: sed Quantity: 1 Physician Procedures : CPT4 Code Description Modifier [id number] 11042 - WC PHYS SUBQ TISS 20 SQ CM ICD-10 Diagnosis Description L97.822  Non-pressure chronic ulcer of other part of left lower leg with fat layer exposed Quantity: 1 Electronic Signature(s) Signed: 01/13/2022 8:06:05 AM By: Worthy Keeler PA-C Entered By: Worthy Keeler on 01/13/2022 08:06:04

## 2022-01-20 ENCOUNTER — Encounter: Payer: Medicare Other | Admitting: Physician Assistant

## 2022-01-20 ENCOUNTER — Ambulatory Visit: Payer: Medicare Other

## 2022-01-20 DIAGNOSIS — Z09 Encounter for follow-up examination after completed treatment for conditions other than malignant neoplasm: Secondary | ICD-10-CM | POA: Diagnosis not present

## 2022-01-20 NOTE — Progress Notes (Addendum)
Courtney Courtney, Courtney Courtney ([id number]) [id number] Page 1 of 9 Visit Report for 01/20/2022 Arrival Information Details Patient Name: Date of Service: Courtney Courtney, Courtney Courtney 01/20/2022 11:00 Courtney Courtney Medical Record Number: [id number] Patient Account Number: [id number] Date of Birth/Sex: Treating RN: 1940-09-10 (82 y.o. Courtney Courtney Primary Care Courtney Courtney: Courtney Courtney Other Clinician: Referring Courtney Courtney: Treating Courtney Courtney/Extender: Courtney Courtney in Treatment: 4 Visit Information History Since Last Visit Added or deleted any medications: No Patient Arrived: Courtney Courtney Any new allergies or adverse reactions: No Arrival Time: 11:03 Had Courtney fall or experienced change in No Accompanied By: husband activities of daily living that may affect Transfer Assistance: None risk of falls: Patient Identification Verified: Yes Signs or symptoms of abuse/neglect since last visito No Secondary Verification Process Completed: Yes Hospitalized since last visit: No Patient Requires Transmission-Based Precautions: No Has Dressing in Place as Prescribed: Yes Patient Has Alerts: Yes Has Compression in Place as Prescribed: Yes Patient Alerts: ABI R1.02 TBI .63 12/09/21 Pain Present Now: No ABI L 1.02TBI .54 12/09/21 Electronic Signature(s) Signed: 01/20/2022 11:44:58 AM By: Rosalio Loud MSN RN CNS WTA Entered By: Rosalio Loud on 01/20/2022 11:44:58 -------------------------------------------------------------------------------- Clinic Level of Care Assessment Details Patient Name: Date of Service: Courtney Courtney 01/20/2022 11:00 Courtney Courtney Medical Record Number: [id number] Patient Account Number: [id number] Date of Birth/Sex: Treating RN: Jun 12, 1940 (82 y.o. Courtney Courtney Primary Care Deira Shimer: Courtney Courtney Other Clinician: Referring Durante Violett: Treating Shana Zavaleta/Extender: Courtney Courtney in Treatment: 4 Clinic Level of Care Assessment Items TOOL 1 Quantity Score '[]'$  -  0 Use when EandM and Procedure is performed on INITIAL visit ASSESSMENTS - Nursing Assessment / Reassessment '[]'$  - 0 General Physical Exam (combine w/ comprehensive assessment (listed just below) when performed on new pt. evals) '[]'$  - 0 Comprehensive Assessment (HX, ROS, Risk Assessments, Wounds Hx, etc.) ASSESSMENTS - Wound and Skin Assessment / Reassessment '[]'$  - 0 Dermatologic / Skin Assessment (not related to wound area) Courtney Courtney ([id number]) [id number] Page 2 of 9 ASSESSMENTS - Ostomy and/or Continence Assessment and Care '[]'$  - 0 Incontinence Assessment and Management '[]'$  - 0 Ostomy Care Assessment and Management (repouching, etc.) PROCESS - Coordination of Care '[]'$  - 0 Simple Patient / Family Education for ongoing care '[]'$  - 0 Complex (extensive) Patient / Family Education for ongoing care '[]'$  - 0 Staff obtains Programmer, systems, Records, T Results / Process Orders est '[]'$  - 0 Staff telephones HHA, Nursing Homes / Clarify orders / etc '[]'$  - 0 Routine Transfer to another Facility (non-emergent condition) '[]'$  - 0 Routine Hospital Admission (non-emergent condition) '[]'$  - 0 New Admissions / Biomedical engineer / Ordering NPWT Apligraf, etc. , '[]'$  - 0 Emergency Hospital Admission (emergent condition) PROCESS - Special Needs '[]'$  - 0 Pediatric / Minor Patient Management '[]'$  - 0 Isolation Patient Management '[]'$  - 0 Hearing / Language / Visual special needs '[]'$  - 0 Assessment of Community assistance (transportation, D/C planning, etc.) '[]'$  - 0 Additional assistance / Altered mentation '[]'$  - 0 Support Surface(s) Assessment (bed, cushion, seat, etc.) INTERVENTIONS - Miscellaneous '[]'$  - 0 External ear exam '[]'$  - 0 Patient Transfer (multiple staff / Civil Service fast streamer / Similar devices) '[]'$  - 0 Simple Staple / Suture removal (25 or less) '[]'$  - 0 Complex Staple / Suture removal (26 or more) '[]'$  - 0 Hypo/Hyperglycemic Management (do not check if billed separately) '[]'$  -  0 Ankle / Brachial Index (ABI) - do not check if billed separately Has the patient been seen at the  hospital within the last three years: Yes Total Score: 0 Level Of Care: ____ Electronic Signature(s) Signed: 02/02/2022 4:58:20 PM By: Rosalio Loud MSN RN CNS WTA Entered By: Rosalio Loud on 01/20/2022 11:49:46 -------------------------------------------------------------------------------- Compression Therapy Details Patient Name: Date of Service: Courtney Leigh Courtney. 01/20/2022 11:00 Courtney Courtney Medical Record Number: [id number] Patient Account Number: [id number] Date of Birth/Sex: Treating RN: March 09, 1940 (82 y.o. Courtney Courtney Primary Care Netty Sullivant: Courtney Courtney Other Clinician: Referring Reo Portela: Treating Omeka Holben/Extender: Courtney Courtney in Treatment: 4 Compression Therapy Performed for Wound Assessment: Wound #1 Left,Lateral Lower Leg Performed By: Leeanne Deed, RN [name], DOVEL Courtney ([id number]) [id number] Page 3 of 9 Compression Type: Three Layer Post Procedure Diagnosis Same as Pre-procedure Electronic Signature(s) Signed: 01/20/2022 11:48:44 AM By: Rosalio Loud MSN RN CNS WTA Entered By: Rosalio Loud on 01/20/2022 11:48:44 -------------------------------------------------------------------------------- Encounter Discharge Information Details Patient Name: Date of Service: Courtney Leigh Courtney. 01/20/2022 11:00 Courtney Courtney Medical Record Number: [id number] Patient Account Number: [id number] Date of Birth/Sex: Treating RN: Oct 10, 1940 (82 y.o. Courtney Courtney Primary Care Angus Amini: Courtney Courtney Other Clinician: Referring Daxtyn Rottenberg: Treating Daquawn Seelman/Extender: Courtney Courtney in Treatment: 4 Encounter Discharge Information Items Post Procedure Vitals Discharge Condition: Stable Temperature (F): 98.1 Ambulatory Status: Ambulatory Pulse (bpm): 72 Discharge Destination: Home Respiratory Rate (breaths/min): 16 Transportation:  Private Auto Blood Pressure (mmHg): 172/59 Accompanied By: husband Schedule Follow-up Appointment: Yes Clinical Summary of Care: Electronic Signature(s) Signed: 01/20/2022 11:57:17 AM By: Rosalio Loud MSN RN CNS WTA Previous Signature: 01/20/2022 11:50:29 AM Version By: Rosalio Loud MSN RN CNS WTA Entered By: Rosalio Loud on 01/20/2022 11:57:17 -------------------------------------------------------------------------------- Lower Extremity Assessment Details Patient Name: Date of Service: Courtney Leigh Courtney. 01/20/2022 11:00 Courtney Courtney Medical Record Number: [id number] Patient Account Number: [id number] Date of Birth/Sex: Treating RN: 1940/12/25 (82 y.o. Courtney Courtney Primary Care Arham Symmonds: Courtney Courtney Other Clinician: Referring Letizia Hook: Treating Zemirah Krasinski/Extender: Courtney Courtney in Treatment: 4 Edema Assessment Assessed: Courtney Courtney: Yes] Patrice Paradise: No] [Left: Edema] [Right: :] S[LeftRiley Lam Courtney ([id number])] [Right: [id number] Page 4 of 9] Calf Left: Right: Point of Measurement: 32 cm From Medial Instep 35 cm Ankle Left: Right: Point of Measurement: 10 cm From Medial Instep 24 cm Vascular Assessment Pulses: Dorsalis Pedis Palpable: [Left:Yes] Electronic Signature(s) Signed: 01/20/2022 11:46:01 AM By: Rosalio Loud MSN RN CNS WTA Entered By: Rosalio Loud on 01/20/2022 11:46:00 -------------------------------------------------------------------------------- Multi Wound Chart Details Patient Name: Date of Service: Courtney Leigh Courtney. 01/20/2022 11:00 Courtney Courtney Medical Record Number: [id number] Patient Account Number: [id number] Date of Birth/Sex: Treating RN: 1940/06/25 (82 y.o. 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/Courtney N/Courtney Primary Etiology: Extremity Hypertension, Type II Diabetes N/Courtney N/Courtney Comorbid History: 08/12/2021 N/Courtney N/Courtney Date Acquired: 4 N/Courtney N/Courtney Weeks of Treatment: Open N/Courtney N/Courtney Wound Status: No N/Courtney N/Courtney Wound Recurrence: 2x0.4x0.1 N/Courtney N/Courtney Measurements L x W x D (cm) 0.628 N/Courtney N/Courtney Courtney (cm) : rea 0.063 N/Courtney N/Courtney Volume (cm) : 84.80% N/Courtney N/Courtney % Reduction in Area: 96.20% N/Courtney N/Courtney % Reduction in Volume: Grade 1 N/Courtney N/Courtney Classification: Courtney Courtney, Courtney Courtney ([id number]) [id number] Page 5 of 9 Medium N/Courtney N/Courtney Exudate Courtney mount: Serosanguineous N/Courtney N/Courtney Exudate  Type: red, brown N/Courtney N/Courtney Exudate Color: Large (67-100%) N/Courtney N/Courtney Granulation Amount: Red, Pink N/Courtney N/Courtney Granulation Quality: Small (1-33%) N/Courtney N/Courtney Necrotic Amount: Fat Layer (Subcutaneous Tissue): Yes N/Courtney N/Courtney Exposed Structures: Fascia: No Tendon: No Muscle: No Joint: No Bone: No Debridement - Excisional N/Courtney N/Courtney Debridement: Subcutaneous, Slough N/Courtney N/Courtney Tissue Debrided: Skin/Subcutaneous Tissue N/Courtney N/Courtney Level: 0.8 N/Courtney N/Courtney Debridement Courtney (sq cm): rea Curette N/Courtney N/Courtney Instrument: Minimum N/Courtney N/Courtney Bleeding: Pressure N/Courtney N/Courtney Hemostasis Courtney chieved: Debridement Treatment Response: Procedure was tolerated well N/Courtney N/Courtney Post Debridement Measurements L x 2x0.4x0.02 N/Courtney N/Courtney W x D (cm) 0.013 N/Courtney N/Courtney Post Debridement Volume: (cm) Debridement N/Courtney N/Courtney Procedures Performed: Treatment Notes Wound #1 (Lower Leg) Wound Laterality: Left, Lateral Cleanser Peri-Wound Care AandD Ointment Discharge Instruction: Apply AandD Ointment as directed Topical Primary Dressing Prisma 4.34 (in) Discharge Instruction: Moisten w/normal saline or sterile water; Cover wound as directed. Do not remove from wound  bed. Secondary Dressing ABD Pad 5x9 (in/in) Discharge Instruction: Cover with ABD pad Secured With Compression Wrap 3-LAYER WRAP - Profore Lite LF 3 Multilayer Compression Bandaging System Discharge Instruction: Apply 3 multi-layer wrap as prescribed. Compression Stockings Add-Ons Electronic Signature(s) Signed: 01/20/2022 11:46:22 AM By: Rosalio Loud MSN RN CNS WTA Entered By: Rosalio Loud on 01/20/2022 11:46:22 -------------------------------------------------------------------------------- Multi-Disciplinary Care Plan Details Patient Name: Date of Service: Courtney Leigh Courtney. 01/20/2022 11:00 Courtney Courtney Medical Record Number: [id number] Patient Account Number: [id number] Date of Birth/Sex: Treating RN: 08-21-1940 (82 y.o. Meliyah, Simon, [place] Courtney ([id number]) [id number] Page 6 of 9 Primary Care Triana Coover: Courtney Courtney Other Clinician: Referring Mamoudou Mulvehill: Treating Odell Fasching/Extender: Courtney Courtney in Treatment: 4 Active Inactive Necrotic Tissue Nursing Diagnoses: Knowledge deficit related to management of necrotic/devitalized tissue Goals: Patient/caregiver will verbalize understanding of reason and process for debridement of necrotic tissue Date Initiated: 12/22/2021 Target Resolution Date: 01/22/2022 Goal Status: Active Interventions: Assess patient pain level pre-, during and post procedure and prior to discharge Provide education on necrotic tissue and debridement process Notes: Wound/Skin Impairment Nursing Diagnoses: Knowledge deficit related to ulceration/compromised skin integrity Goals: Patient/caregiver will verbalize understanding of skin care regimen Date Initiated: 12/22/2021 Target Resolution Date: 01/22/2022 Goal Status: Active Ulcer/skin breakdown will have Courtney volume reduction of 30% by week 4 Date Initiated: 12/22/2021 Target Resolution Date: 01/22/2022 Goal Status: Active Ulcer/skin breakdown will have Courtney volume  reduction of 50% by week 8 Date Initiated: 12/22/2021 Target Resolution Date: 02/22/2022 Goal Status: Active Ulcer/skin breakdown will have Courtney volume reduction of 80% by week 12 Date Initiated: 12/22/2021 Target Resolution Date: 03/23/2022 Goal Status: Active Ulcer/skin breakdown will heal within 14 weeks Date Initiated: 12/22/2021 Target Resolution Date: 04/23/2022 Goal Status: Active Interventions: Assess patient/caregiver ability to obtain necessary supplies Assess patient/caregiver ability to perform ulcer/skin care regimen upon admission and as needed Assess ulceration(s) every visit Notes: Electronic Signature(s) Signed: 02/02/2022 4:58:20 PM By: Rosalio Loud MSN RN CNS WTA Entered By: Rosalio Loud on 01/20/2022 11:25:14 -------------------------------------------------------------------------------- Pain Assessment Details Patient Name: Date of Service: Courtney Leigh Courtney. 01/20/2022 11:00 Courtney Courtney Medical Record Number: [id number] Patient Account Number: [id number] Date of Birth/Sex: Treating RN: 1940/07/22 (82 y.o. Courtney Courtney, Courtney Courtney, Courtney Courtney ([id number]) [id number] Page 7 of 9 Primary Care Valma Rotenberg: Courtney Courtney Other Clinician: Referring Desi Carby: Treating Raylin Diguglielmo/Extender: Courtney Courtney in Treatment: 4 Active Problems Location of Pain Severity and Description of Pain Patient Has Paino No Site Locations Pain Management and Medication Current Pain Management: Electronic Signature(s) Signed: 01/20/2022 11:45:41 AM  By: Rosalio Loud MSN RN CNS WTA Entered By: Rosalio Loud on 01/20/2022 11:45:41 -------------------------------------------------------------------------------- Patient/Caregiver Education Details Patient Name: Date of Service: Courtney Courtney 1/12/2024andnbsp11:00 Courtney Courtney Medical Record Number: [id number] Patient Account Number: [id number] Date of Birth/Gender: Treating RN: [birth year] (82 y.o. Courtney Courtney Primary  Care Physician: Courtney Courtney Other Clinician: Referring Physician: Treating Physician/Extender: Courtney Courtney in Treatment: 4 Education Assessment Education Provided To: Patient Education Topics Provided Wound Debridement: Handouts: Wound Debridement Methods: Explain/Verbal Responses: State content correctly Electronic Signature(s) Signed: 02/02/2022 4:58:20 PM By: Rosalio Loud MSN RN CNS [address] Zameria Courtney ([id number]) [id number] Page 8 of 9 Entered By: Rosalio Loud on 01/20/2022 11:25:10 -------------------------------------------------------------------------------- Wound Assessment Details Patient Name: Date of Service: Courtney Courtney, Courtney Courtney 01/20/2022 11:00 Courtney Courtney Medical Record Number: [id number] Patient Account Number: [id number] Date of Birth/Sex: Treating RN: 07/23/1940 (82 y.o. Courtney Courtney Primary Care Tevita Gomer: Courtney Courtney Other Clinician: Referring Cyrstal Leitz: Treating Cassie Shedlock/Extender: Courtney Courtney in Treatment: 4 Wound Status Wound Number: 1 Primary Etiology: Diabetic Wound/Ulcer of the Lower Extremity Wound Location: Left, Lateral Lower Leg Wound Status: Open Wounding Event: Trauma Comorbid History: Hypertension, Type II Diabetes Date Acquired: 08/12/2021 Weeks Of Treatment: 4 Clustered Wound: No Photos Wound Measurements Length: (cm) 2 Width: (cm) 0.4 Depth: (cm) 0.1 Area: (cm) 0.628 Volume: (cm) 0.063 % Reduction in Area: 84.8% % Reduction in Volume: 96.2% Wound Description Classification: Grade 1 Exudate Amount: Medium Exudate Type: Serosanguineous Exudate Color: red, brown Foul Odor After Cleansing: No Slough/Fibrino Yes Wound Bed Granulation Amount: Large (67-100%) Exposed Structure Granulation Quality: Red, Pink Fascia Exposed: No Necrotic Amount: Small (1-33%) Fat Layer (Subcutaneous Tissue) Exposed: Yes Necrotic Quality: Adherent Slough Tendon Exposed: No Muscle Exposed:  No Joint Exposed: No Bone Exposed: No Electronic Signature(s) Signed: 02/02/2022 4:58:20 PM By: Rosalio Loud MSN RN CNS WTA Entered By: Rosalio Loud on 01/20/2022 11:16:56 Courtney Courtney, Courtney Courtney ([id number]) [id number] Page 9 of 9 -------------------------------------------------------------------------------- Vitals Details Patient Name: Date of Service: Courtney Courtney, Courtney Courtney 01/20/2022 11:00 Courtney Courtney Medical Record Number: [id number] Patient Account Number: [id number] Date of Birth/Sex: Treating RN: 12-Jun-1940 (82 y.o. Courtney Courtney Primary Care Jeray Shugart: Courtney Courtney Other Clinician: Referring Ewelina Naves: Treating Kayliegh Boyers/Extender: Courtney Courtney in Treatment: 4 Vital Signs Time Taken: 11:06 Temperature (F): 98.1 Weight (lbs): 206 Pulse (bpm): 72 Respiratory Rate (breaths/min): 16 Blood Pressure (mmHg): 172/59 Reference Range: 80 - 120 mg / dl Electronic Signature(s) Signed: 01/20/2022 11:45:05 AM By: Rosalio Loud MSN RN CNS WTA Entered By: Rosalio Loud on 01/20/2022 11:45:05

## 2022-01-20 NOTE — Progress Notes (Addendum)
WESTON, FULCO [name] ([id number]) [id number] Page 1 of 8 Visit Report for 01/20/2022 Chief Complaint Document Details Patient Name: Date of Service: Courtney [name], Courtney [name] 01/20/2022 11:00 [name] Medical Record Number: [id number] Patient Account Number: [id number] Date of Birth/Sex: Treating RN: August 07, 1940 (82 y.o. Drema Pry Primary Care Provider: Emily Filbert Other Clinician: Referring Provider: Treating Provider/Extender: Leatha Gilding in Treatment: 4 Information Obtained from: Patient Chief Complaint Left LE Ulcer Electronic Signature(s) Signed: 01/20/2022 10:58:28 AM By: Worthy Keeler PA-C Entered By: Worthy Keeler on 01/20/2022 10:58:28 -------------------------------------------------------------------------------- Debridement Details Patient Name: Date of Service: Courtney Leigh [name]. 01/20/2022 11:00 [name] Medical Record Number: [id number] Patient Account Number: [id number] Date of Birth/Sex: Treating RN: 03/30/1940 (82 y.o. Drema Pry Primary Care Provider: Emily Filbert Other Clinician: Referring Provider: Treating Provider/Extender: Leatha Gilding in Treatment: 4 Debridement Performed for Assessment: Wound #1 Left,Lateral Lower Leg Performed By: Physician Tommie Sams., PA-C Debridement Type: Debridement Severity of Tissue Pre Debridement: Fat layer exposed Level of Consciousness (Pre-procedure): Awake and Alert Pre-procedure Verification/Time Out No Taken: T Area Debrided (L x W): otal 2 (cm) x 0.4 (cm) = 0.8 (cm) Tissue and other material debrided: Slough, Subcutaneous, Slough Level: Skin/Subcutaneous Tissue Debridement Description: Excisional Instrument: Curette Bleeding: Minimum Hemostasis Achieved: Pressure Response to Treatment: Procedure was tolerated well Level of Consciousness (Post- Awake and Alert procedure): Post Debridement Measurements of Total Wound Walling, Courtney [name] ([id number])  [id number] Page 2 of 8 Length: (cm) 2 Width: (cm) 0.4 Depth: (cm) 0.02 Volume: (cm) 0.013 Character of Wound/Ulcer Post Debridement: Stable Severity of Tissue Post Debridement: Fat layer exposed Post Procedure Diagnosis Same as Pre-procedure Electronic Signature(s) Signed: 01/20/2022 1:28:04 PM By: Worthy Keeler PA-C Signed: 02/02/2022 4:58:20 PM By: Rosalio Loud MSN RN CNS WTA Entered By: Rosalio Loud on 01/20/2022 11:24:26 -------------------------------------------------------------------------------- HPI Details Patient Name: Date of Service: Courtney Leigh [name]. 01/20/2022 11:00 [name] Medical Record Number: [id number] Patient Account Number: [id number] Date of Birth/Sex: Treating RN: 03/24/1940 (82 y.o. Drema Pry Primary Care Provider: Emily Filbert Other Clinician: Referring Provider: Treating Provider/Extender: Leatha Gilding in Treatment: 4 History of Present Illness HPI Description: 12-22-2021 upon evaluation today patient appears to be doing well currently in regard to her wound all things considered especially in light of the picture that they showed me from August when she initially had [name] skin tear. Fortunately there does not appear to be any signs of active infection locally nor systemically at this time which is great news and overall I am extremely pleased with the fact that she is making some progress here she has been placed on Cipro due to [name] culture result that was done by rheumatologist which showed that she had Pseudomonas and Staphylococcus aureus noted as the organisms in question. Fortunately I do not think that there is anything that seems to be still present or significant here I think she is actually doing much better which is great news. With that being said the patient tells me that she has been using Medihoney at this point which actually is really good does help to clear up [name] lot of this I do believe. In the  beginning she was also placed on or rather in an Unna boot wrap based on what we are being told that was for [name] couple of weeks and then she did not have any additional follow-up with primary eventually rheumatology who were doing injections for her realize that she  had [name] wound they stopped the injections which obviously would lower her immune response and healing chances. That is when she started on the antibiotics and subsequently also was referred to vascular. The good news is from [name] vascular standpoint she seems to be probably sufficient to heal with [name] right ABI of 1.02 and [name] left ABI of 1.02 as well. Her TBI on the right was 0.63 and on the left 0.54. She does have [name] history of diabetes mellitus type 2 and she has an A1c of 6.5 on 10-17-2021. Other than this she also does have chronic venous insufficiency her leg is swollen today I definitely think [name] compression wraps probably can help her she also has hypertension 12-29-2021 upon evaluation patient's wound is actually showing signs of excellent improvement. Fortunately I do not see any signs of infection I think she is making good progress here and overall I do believe that she is really looking quite nice as far as the overall appearance of the wound bed is concerned. There is [name] little bit of necrotic tissue noted on the surface of the wound that is going require debridement today. 12/28; continued improvement in the area on the left lateral leg which was initially [name] skin tear in the setting of chronic venous insufficiency. She has been using Prisma with 3 layer compression. The length of the wound is down 0.5 cm 01-12-2022 upon evaluation today patient appears to be doing well currently in regard to her leg ulcer. She has been tolerating dressing changes without complication. Fortunately I do not see any signs of active infection locally nor systemically which is great news. No fevers, chills, nausea, vomiting, or diarrhea. 01-20-2022 upon  evaluation today patient appears to be doing well currently in regard to her wound. In fact this is measuring significantly smaller and I am very pleased with where we stand. I do not see any signs of active infection locally or systemically at this time. No fevers, chills, nausea, vomiting, or diarrhea. Electronic Signature(s) Signed: 01/20/2022 1:08:57 PM By: Worthy Keeler PA-C Entered By: Worthy Keeler on 01/20/2022 13:08:57 Courtney [name], Courtney [name] ([id number]) [id number] Page 3 of 8 -------------------------------------------------------------------------------- Physical Exam Details Patient Name: Date of Service: Courtney [name], Courtney [name]. 01/20/2022 11:00 [name] Medical Record Number: [id number] Patient Account Number: [id number] Date of Birth/Sex: Treating RN: 22-May-1940 (82 y.o. Drema Pry Primary Care Provider: Emily Filbert Other Clinician: Referring Provider: Treating Provider/Extender: Leatha Gilding in Treatment: 4 Constitutional Well-nourished and well-hydrated in no acute distress. Respiratory normal breathing without difficulty. Psychiatric this patient is able to make decisions and demonstrates good insight into disease process. Alert and Oriented x 3. pleasant and cooperative. Notes Upon inspection patient's wound bed showed evidence of good granulation epithelization at this point. Fortunately I see no signs of active infection at this time which is great and overall I do believe that we are headed in the right direction. Electronic Signature(s) Signed: 01/20/2022 1:09:28 PM By: Worthy Keeler PA-C Entered By: Worthy Keeler on 01/20/2022 13:09:28 -------------------------------------------------------------------------------- Physician Orders Details Patient Name: Date of Service: Courtney Leigh [name]. 01/20/2022 11:00 [name] Medical Record Number: [id number] Patient Account Number: [id number] Date of Birth/Sex: Treating RN: 1940-07-06 (82  y.o. Drema Pry Primary Care Provider: Emily Filbert Other Clinician: Referring Provider: Treating Provider/Extender: Leatha Gilding in Treatment: 4 Verbal / Phone Orders: No Diagnosis Coding ICD-10 Coding Code Description E11.622 Type 2 diabetes mellitus with other skin ulcer I87.332 Chronic  venous hypertension (idiopathic) with ulcer and inflammation of left lower extremity I73.89 Other specified peripheral vascular diseases L97.822 Non-pressure chronic ulcer of other part of left lower leg with fat layer exposed I10 Essential (primary) hypertension Follow-up Appointments ANAYI, BRICCO [name] ([id number]) [id number] Page 4 of 8 Return Appointment in 1 week. Bathing/ Shower/ Hygiene May shower; gently cleanse wound with antibacterial soap, rinse and pat dry prior to dressing wounds Anesthetic (Use 'Patient Medications' Section for Anesthetic Order Entry) Lidocaine applied to wound bed Edema Control - Lymphedema / Segmental Compressive Device / Other Optional: One layer of unna paste to top of compression wrap (to act as an anchor). 3 Layer Compression System for Lymphedema. Elevate, Exercise Daily and [name] void Standing for Long Periods of Time. Elevate legs to the level of the heart and pump ankles as often as possible Elevate leg(s) parallel to the floor when sitting. Wound Treatment Wound #1 - Lower Leg Wound Laterality: Left, Lateral Peri-Wound Care: AandD Ointment Every Other Day/30 Days Discharge Instructions: Apply AandD Ointment as directed Prim Dressing: Prisma 4.34 (in) Every Other Day/30 Days ary Discharge Instructions: Moisten w/normal saline or sterile water; Cover wound as directed. Do not remove from wound bed. Secondary Dressing: ABD Pad 5x9 (in/in) Every Other Day/30 Days Discharge Instructions: Cover with ABD pad Compression Wrap: 3-LAYER WRAP - Profore Lite LF 3 Multilayer Compression Bandaging System Every Other Day/30  Days Discharge Instructions: Apply 3 multi-layer wrap as prescribed. Electronic Signature(s) Signed: 01/20/2022 1:28:04 PM By: Worthy Keeler PA-C Signed: 02/02/2022 4:58:20 PM By: Rosalio Loud MSN RN CNS WTA Entered By: Rosalio Loud on 01/20/2022 11:49:39 -------------------------------------------------------------------------------- Problem List Details Patient Name: Date of Service: Courtney Leigh [name]. 01/20/2022 11:00 [name] Medical Record Number: [id number] Patient Account Number: [id number] Date of Birth/Sex: Treating RN: July 20, 1940 (82 y.o. Drema Pry Primary Care Provider: Emily Filbert Other Clinician: Referring Provider: Treating Provider/Extender: Leatha Gilding in Treatment: 4 Active Problems ICD-10 Encounter Code Description Active Date MDM Diagnosis E11.622 Type 2 diabetes mellitus with other skin ulcer 12/22/2021 No Yes I87.332 Chronic venous hypertension (idiopathic) with ulcer and inflammation of left 12/22/2021 No Yes lower extremity I73.89 Other specified peripheral vascular diseases 12/22/2021 No Yes Courtney [name], Courtney [name] ([id number]) [id number] Page 5 of 8 [phone] Non-pressure chronic ulcer of other part of left lower leg with fat layer 12/22/2021 No Yes exposed [place] (primary) hypertension 12/22/2021 No Yes Inactive Problems Resolved Problems Electronic Signature(s) Signed: 01/20/2022 10:58:23 AM By: Worthy Keeler PA-C Entered By: Worthy Keeler on 01/20/2022 10:58:23 -------------------------------------------------------------------------------- Progress Note Details Patient Name: Date of Service: Courtney Leigh [name]. 01/20/2022 11:00 [name] Medical Record Number: [id number] Patient Account Number: [id number] Date of Birth/Sex: Treating RN: 05/23/1940 (82 y.o. Drema Pry Primary Care Provider: Emily Filbert Other Clinician: Referring Provider: Treating Provider/Extender: Leatha Gilding in  Treatment: 4 Subjective Chief Complaint Information obtained from Patient Left LE Ulcer History of Present Illness (HPI) 12-22-2021 upon evaluation today patient appears to be doing well currently in regard to her wound all things considered especially in light of the picture that they showed me from August when she initially had [name] skin tear. Fortunately there does not appear to be any signs of active infection locally nor systemically at this time which is great news and overall I am extremely pleased with the fact that she is making some progress here she has been placed on Cipro due to [name] culture result that was done by rheumatologist which  showed that she had Pseudomonas and Staphylococcus aureus noted as the organisms in question. Fortunately I do not think that there is anything that seems to be still present or significant here I think she is actually doing much better which is great news. With that being said the patient tells me that she has been using Medihoney at this point which actually is really good does help to clear up [name] lot of this I do believe. In the beginning she was also placed on or rather in an Unna boot wrap based on what we are being told that was for [name] couple of weeks and then she did not have any additional follow-up with primary eventually rheumatology who were doing injections for her realize that she had [name] wound they stopped the injections which obviously would lower her immune response and healing chances. That is when she started on the antibiotics and subsequently also was referred to vascular. The good news is from [name] vascular standpoint she seems to be probably sufficient to heal with [name] right ABI of 1.02 and [name] left ABI of 1.02 as well. Her TBI on the right was 0.63 and on the left 0.54. She does have [name] history of diabetes mellitus type 2 and she has an A1c of 6.5 on 10-17-2021. Other than this she also does have chronic venous insufficiency her leg is swollen today  I definitely think [name] compression wraps probably can help her she also has hypertension 12-29-2021 upon evaluation patient's wound is actually showing signs of excellent improvement. Fortunately I do not see any signs of infection I think she is making good progress here and overall I do believe that she is really looking quite nice as far as the overall appearance of the wound bed is concerned. There is [name] little bit of necrotic tissue noted on the surface of the wound that is going require debridement today. 12/28; continued improvement in the area on the left lateral leg which was initially [name] skin tear in the setting of chronic venous insufficiency. She has been using Prisma with 3 layer compression. The length of the wound is down 0.5 cm 01-12-2022 upon evaluation today patient appears to be doing well currently in regard to her leg ulcer. She has been tolerating dressing changes without complication. Fortunately I do not see any signs of active infection locally nor systemically which is great news. No fevers, chills, nausea, vomiting, or diarrhea. 01-20-2022 upon evaluation today patient appears to be doing well currently in regard to her wound. In fact this is measuring significantly smaller and I am very pleased with where we stand. I do not see any signs of active infection locally or systemically at this time. No fevers, chills, nausea, vomiting, or diarrhea. Courtney [name], Courtney [name] ([id number]) [id number] Page 6 of 8 Objective Constitutional Well-nourished and well-hydrated in no acute distress. Vitals Time Taken: 11:06 AM, Weight: 206 lbs, Temperature: 98.1 F, Pulse: 72 bpm, Respiratory Rate: 16 breaths/min, Blood Pressure: 172/59 mmHg. Respiratory normal breathing without difficulty. Psychiatric this patient is able to make decisions and demonstrates good insight into disease process. Alert and Oriented x 3. pleasant and cooperative. General Notes: Upon inspection  patient's wound bed showed evidence of good granulation epithelization at this point. Fortunately I see no signs of active infection at this time which is great and overall I do believe that we are headed in the right direction. Integumentary (Hair, Skin) Wound #1 status is Open. Original cause of wound was Trauma. The date acquired  was: 08/12/2021. The wound has been in treatment 4 weeks. The wound is located on the Left,Lateral Lower Leg. The wound measures 2cm length x 0.4cm width x 0.1cm depth; 0.628cm^2 area and 0.063cm^3 volume. There is Fat Layer (Subcutaneous Tissue) exposed. There is [name] medium amount of serosanguineous drainage noted. There is large (67-100%) red, pink granulation within the wound bed. There is [name] small (1-33%) amount of necrotic tissue within the wound bed including Adherent Slough. Assessment Active Problems ICD-10 Type 2 diabetes mellitus with other skin ulcer Chronic venous hypertension (idiopathic) with ulcer and inflammation of left lower extremity Other specified peripheral vascular diseases Non-pressure chronic ulcer of other part of left lower leg with fat layer exposed Essential (primary) hypertension Procedures Wound #1 Pre-procedure diagnosis of Wound #1 is [name] Diabetic Wound/Ulcer of the Lower Extremity located on the Left,Lateral Lower Leg .Severity of Tissue Pre Debridement is: Fat layer exposed. There was [name] Excisional Skin/Subcutaneous Tissue Debridement with [name] total area of 0.8 sq cm performed by Tommie Sams., PA-C. With the following instrument(s): Curette Material removed includes Subcutaneous Tissue and Slough and. No specimens were taken.[name] Minimum amount of bleeding was controlled with Pressure. The procedure was tolerated well. Post Debridement Measurements: 2cm length x 0.4cm width x 0.02cm depth; 0.013cm^3 volume. Character of Wound/Ulcer Post Debridement is stable. Severity of Tissue Post Debridement is: Fat layer exposed. Post procedure Diagnosis  Wound #1: Same as Pre-Procedure Pre-procedure diagnosis of Wound #1 is [name] Diabetic Wound/Ulcer of the Lower Extremity located on the Left,Lateral Lower Leg . There was [name] Three Layer Compression Therapy Procedure by Rosalio Loud, RN. Post procedure Diagnosis Wound #1: Same as Pre-Procedure Plan Follow-up Appointments: Return Appointment in 1 week. Bathing/ Shower/ Hygiene: May shower; gently cleanse wound with antibacterial soap, rinse and pat dry prior to dressing wounds Anesthetic (Use 'Patient Medications' Section for Anesthetic Order Entry): Lidocaine applied to wound bed Edema Control - Lymphedema / Segmental Compressive Device / Other: Optional: One layer of unna paste to top of compression wrap (to act as an anchor). 3 Layer Compression System for Lymphedema. Elevate, Exercise Daily and Avoid Standing for Long Periods of Time. Elevate legs to the level of the heart and pump ankles as often as possible Elevate leg(s) parallel to the floor when sitting. WOUND #1: - Lower Leg Wound Laterality: Left, Lateral Peri-Wound Care: AandD Ointment Every Other Day/30 Days Discharge Instructions: Apply AandD Ointment as directed Prim Dressing: Prisma 4.34 (in) Every Other Day/30 Days ary Courtney [name], Courtney [name] ([id number]) [id number] Page 7 of 8 Discharge Instructions: Moisten w/normal saline or sterile water; Cover wound as directed. Do not remove from wound bed. Secondary Dressing: ABD Pad 5x9 (in/in) Every Other Day/30 Days Discharge Instructions: Cover with ABD pad Compression Wrap: 3-LAYER WRAP - Profore Lite LF 3 Multilayer Compression Bandaging System Every Other Day/30 Days Discharge Instructions: Apply 3 multi-layer wrap as prescribed. 1. I am going to recommend that we continue with the silver collagen dressing which I think is still doing [name] good job. 2. I am also can recommend the patient should continue to monitor for any signs of infection or worsening. Based on  what I am seeing I do feel like that she is doing excellent and I think we are on the right track I would recommend that we continue with the 3 layer compression wrap as well which I feel like is doing really well for her. We will see patient back for reevaluation in 1 week here in the clinic. If anything  worsens or changes patient will contact our office for additional recommendations. Electronic Signature(s) Signed: 01/20/2022 1:16:43 PM By: Worthy Keeler PA-C Entered By: Worthy Keeler on 01/20/2022 13:16:43 -------------------------------------------------------------------------------- SuperBill Details Patient Name: Date of Service: Courtney Leigh [name]. 01/20/2022 Medical Record Number: [id number] Patient Account Number: [id number] Date of Birth/Sex: Treating RN: 1940-10-21 (82 y.o. Drema Pry Primary Care Provider: Emily Filbert Other Clinician: Referring Provider: Treating Provider/Extender: Leatha Gilding in Treatment: 4 Diagnosis Coding ICD-10 Codes Code Description [phone] Type 2 diabetes mellitus with other skin ulcer I87.332 Chronic venous hypertension (idiopathic) with ulcer and inflammation of left lower extremity I73.89 Other specified peripheral vascular diseases L97.822 Non-pressure chronic ulcer of other part of left lower leg with fat layer exposed I10 Essential (primary) hypertension Facility Procedures : CPT4 Code: [id number] Description: 07622 - DEB SUBQ TISSUE 20 SQ CM/< ICD-10 Diagnosis Description L97.822 Non-pressure chronic ulcer of other part of left lower leg with fat layer expos Modifier: ed Quantity: 1 Physician Procedures : CPT4 Code Description Modifier [id number] WC PHYS LEVEL 3 NEW PT 25 ICD-10 Diagnosis Description E11.622 Type 2 diabetes mellitus with other skin ulcer I87.332 Chronic venous hypertension (idiopathic) with ulcer and inflammation of left lower extremity  I73.89 Other specified peripheral vascular diseases L97.822  Non-pressure chronic ulcer of other part of left lower leg with fat layer exposed Quantity: 1 : [id number] 11042 - WC PHYS SUBQ TISS 20 SQ CM ICD-10 Diagnosis Description L97.822 Non-pressure chronic ulcer of other part of left lower leg with fat layer exposed Courtney [name], Courtney [name] ([id number]) [id number] Page 8 of Quantity: 1 8 Electronic Signature(s) Signed: 01/20/2022 1:17:38 PM By: Worthy Keeler PA-C Entered By: Worthy Keeler on 01/20/2022 13:17:38

## 2022-01-26 DIAGNOSIS — Z09 Encounter for follow-up examination after completed treatment for conditions other than malignant neoplasm: Secondary | ICD-10-CM | POA: Diagnosis not present

## 2022-01-27 NOTE — Progress Notes (Signed)
Courtney [name], Courtney [name] ([id number]) [id number] Page 1 of 2 Visit Report for 01/26/2022 Physician Orders Details Patient Name: Date of Service: Courtney [name], Courtney [name] 01/26/2022 11:45 [name] Medical Record Number: [id number] Patient Account Number: [id number] Date of Birth/Sex: Treating RN: 1940-05-17 (82 y.o. [place] Primary Care Provider: Emily Filbert Other Clinician: Massie Kluver Referring Provider: Treating Provider/Extender: Leatha Gilding in Treatment: 5 Verbal / Phone Orders: No Diagnosis Coding Follow-up Appointments Return Appointment in 1 week. Bathing/ Shower/ Hygiene May shower; gently cleanse wound with antibacterial soap, rinse and pat dry prior to dressing wounds Anesthetic (Use 'Patient Medications' Section for Anesthetic Order Entry) Lidocaine applied to wound bed Edema Control - Lymphedema / Segmental Compressive Device / Other Optional: One layer of unna paste to top of compression wrap (to act as an anchor). 3 Layer Compression System for Lymphedema. Elevate, Exercise Daily and [name] void Standing for Long Periods of Time. Elevate legs to the level of the heart and pump ankles as often as possible Elevate leg(s) parallel to the floor when sitting. Wound Treatment Wound #1 - Lower Leg Wound Laterality: Left, Lateral Peri-Wound Care: AandD Ointment Every Other Day/30 Days Discharge Instructions: Apply AandD Ointment as directed Prim Dressing: Prisma 4.34 (in) Every Other Day/30 Days ary Discharge Instructions: Moisten w/normal saline or sterile water; Cover wound as directed. Do not remove from wound bed. Secondary Dressing: ABD Pad 5x9 (in/in) Every Other Day/30 Days Discharge Instructions: Cover with ABD pad Compression Wrap: 3-LAYER WRAP - Profore Lite LF 3 Multilayer Compression Bandaging System Every Other Day/30 Days Discharge Instructions: Apply 3 multi-layer wrap as prescribed. Electronic Signature(s) Signed: 01/26/2022  5:43:57 PM By: Worthy Keeler PA-C Signed: 01/27/2022 9:50:53 AM By: Massie Kluver Entered By: Massie Kluver on 01/26/2022 12:17:09 [name] ([id number]) [id number] Page 2 of 2 -------------------------------------------------------------------------------- SuperBill Details Patient Name: Date of Service: Courtney [name], Courtney [name] 01/26/2022 Medical Record Number: [id number] Patient Account Number: [id number] Date of Birth/Sex: Treating RN: [birth year] (82 y.o. [name], [name] Primary Care Provider: Emily Filbert Other Clinician: Massie Kluver Referring Provider: Treating Provider/Extender: Leatha Gilding in Treatment: 5 Diagnosis Coding ICD-10 Codes Code Description [phone] Type 2 diabetes mellitus with other skin ulcer I87.332 Chronic venous hypertension (idiopathic) with ulcer and inflammation of left lower extremity I73.89 Other specified peripheral vascular diseases L97.822 Non-pressure chronic ulcer of other part of left lower leg with fat layer exposed I10 Essential (primary) hypertension Facility Procedures : CPT4 Code: [id number] Description: (Facility Use Only) [phone] - APPLY [place] LWR LT LEG ICD-10 Diagnosis Description E11.622 Type 2 diabetes mellitus with other skin ulcer Modifier: Quantity: 1 Electronic Signature(s) Signed: 01/26/2022 5:43:57 PM By: Worthy Keeler PA-C Signed: 01/27/2022 9:50:53 AM By: Massie Kluver Entered By: Massie Kluver on 01/26/2022 12:18:04

## 2022-01-27 NOTE — Progress Notes (Signed)
Courtney [name] [name] ([id number]) [id number] Page 1 of 4 Visit Report for 01/26/2022 Arrival Information Details Patient Name: Date of Service: Courtney [name], Courtney [name] 01/26/2022 11:45 [name] Medical Record Number: [id number] Patient Account Number: [id number] Date of Birth/Sex: Treating RN: 1940-09-08 (82 y.o. Courtney [name] Primary Care Courtney [name]: Emily Filbert Other Clinician: Massie Kluver Referring Courtney [name]: Treating Courtney [name]/Extender: Courtney [name] in Treatment: 5 Visit Information History Since Last Visit All ordered tests and consults were completed: No Patient Arrived: [name] Added or deleted any medications: No Arrival Time: 11:47 Any new allergies or adverse reactions: No Transfer Assistance: None Had [name] fall or experienced change in No Patient Identification Verified: Yes activities of daily living that may affect Secondary Verification Process Completed: Yes risk of falls: Patient Requires Transmission-Based No Signs or symptoms of abuse/neglect since last visito No Precautions: Hospitalized since last visit: No Patient Has Alerts: Yes Implantable device outside of the clinic excluding No Patient Alerts: ABI R1.02 TBI .63 12/09/21 cellular tissue based products placed in the center ABI L 1.02TBI .54 since last visit: 12/09/21 Has Dressing in Place as Prescribed: Yes Has Compression in Place as Prescribed: Yes Pain Present Now: No Electronic Signature(s) Signed: 01/27/2022 9:50:53 AM By: Massie Kluver Entered By: Massie Kluver on 01/26/2022 11:54:52 -------------------------------------------------------------------------------- Clinic Level of Care Assessment Details Patient Name: Date of Service: Courtney [name], Courtney [name] 01/26/2022 11:45 [name] Medical Record Number: [id number] Patient Account Number: [id number] Date of Birth/Sex: Treating RN: [birth year] (82 y.o. Courtney [name] Primary Care Courtney [name]: Emily Filbert Other Clinician: Massie Kluver Referring Courtney [name]: Treating Courtney [name]/Extender: Courtney [name] in Treatment: 5 Clinic Level of Care Assessment Items TOOL 1 Quantity Score '[]'$  - 0 Use when EandM and Procedure is performed on INITIAL visit ASSESSMENTS - Nursing Assessment / Reassessment '[]'$  - 0 General Physical Exam (combine w/ comprehensive assessment (listed just below) when performed on new pt. evals) Courtney [name], Courtney [name] ([id number]) [id number] Page 2 of 4 '[]'$  - 0 Comprehensive Assessment (HX, ROS, Risk Assessments, Wounds Hx, etc.) ASSESSMENTS - Wound and Skin Assessment / Reassessment '[]'$  - 0 Dermatologic / Skin Assessment (not related to wound area) ASSESSMENTS - Ostomy and/or Continence Assessment and Care '[]'$  - 0 Incontinence Assessment and Management '[]'$  - 0 Ostomy Care Assessment and Management (repouching, etc.) PROCESS - Coordination of Care '[]'$  - 0 Simple Patient / Family Education for ongoing care '[]'$  - 0 Complex (extensive) Patient / Family Education for ongoing care '[]'$  - 0 Staff obtains Programmer, systems, Records, T Results / Process Orders est '[]'$  - 0 Staff telephones HHA, Nursing Homes / Clarify orders / etc '[]'$  - 0 Routine Transfer to another Facility (non-emergent condition) '[]'$  - 0 Routine Hospital Admission (non-emergent condition) '[]'$  - 0 New Admissions / Biomedical engineer / Ordering NPWT Apligraf, etc. , '[]'$  - 0 Emergency Hospital Admission (emergent condition) PROCESS - Special Needs '[]'$  - 0 Pediatric / Minor Patient Management '[]'$  - 0 Isolation Patient Management '[]'$  - 0 Hearing / Language / Visual special needs '[]'$  - 0 Assessment of Community assistance (transportation, D/C planning, etc.) '[]'$  - 0 Additional assistance / Altered mentation '[]'$  - 0 Support Surface(s) Assessment (bed, cushion, seat, etc.) INTERVENTIONS - Miscellaneous '[]'$  - 0 External ear exam '[]'$  - 0 Patient Transfer (multiple staff / Civil Service fast streamer / Similar devices) '[]'$  -  0 Simple Staple / Suture removal (25 or less) '[]'$  - 0 Complex Staple / Suture removal (26 or more) '[]'$  - 0 Hypo/Hyperglycemic Management (do not check if  billed separately) '[]'$  - 0 Ankle / Brachial Index (ABI) - do not check if billed separately Has the patient been seen at the hospital within the last three years: Yes Total Score: 0 Level Of Care: ____ Electronic Signature(s) Signed: 01/27/2022 9:50:53 AM By: Massie Kluver Entered By: Massie Kluver on 01/26/2022 12:17:38 -------------------------------------------------------------------------------- Compression Therapy Details Patient Name: Date of Service: Courtney Leigh [name]. 01/26/2022 11:45 [name] Medical Record Number: [id number] Patient Account Number: [id number] Date of Birth/Sex: Treating RN: 1940/10/24 (82 y.o. Courtney [name] Primary Care Courtney [name]: Emily Filbert Other Clinician: Merri, Dimaano [name] ([id number]) [id number] Page 3 of 4 Referring Courtney [name]: Treating Courtney [name]/Extender: Courtney [name] in Treatment: 5 Compression Therapy Performed for Wound Assessment: Wound #1 Left,Lateral Lower Leg Performed By: Courtney [name], Courtney, Compression Type: Three Layer Pre Treatment ABI: 1 Electronic Signature(s) Signed: 01/27/2022 9:50:53 AM By: Massie Kluver Entered By: Massie Kluver on 01/26/2022 11:56:02 -------------------------------------------------------------------------------- Encounter Discharge Information Details Patient Name: Date of Service: Courtney Leigh [name]. 01/26/2022 11:45 [name] Medical Record Number: [id number] Patient Account Number: [id number] Date of Birth/Sex: Treating RN: 03-18-1940 (82 y.o. Courtney [name] Primary Care Courtney [name]: Emily Filbert Other Clinician: Massie Kluver Referring Courtney [name]: Treating Courtney [name]/Extender: Courtney [name] in Treatment: 5 Encounter Discharge Information Items Discharge Condition: Stable Ambulatory Status:  Cane Discharge Destination: Home Transportation: Private Auto Accompanied By: self Schedule Follow-up Appointment: Yes Clinical Summary of Care: Electronic Signature(s) Signed: 01/27/2022 9:50:53 AM By: Massie Kluver Entered By: Massie Kluver on 01/26/2022 12:17:32 -------------------------------------------------------------------------------- Wound Assessment Details Patient Name: Date of Service: Courtney [name], Courtney [name] 01/26/2022 11:45 [name] Medical Record Number: [id number] Patient Account Number: [id number] Date of Birth/Sex: Treating RN: 03/29/1940 (82 y.o. Courtney [name] Primary Care Haseeb Fiallos: Emily Filbert Other Clinician: Massie Kluver Referring Lanaiya Lantry: Treating Jennafer Gladue/Extender: Courtney [name] in Treatment: 5 Wound Status Wound Number: 1 Primary Etiology: Diabetic Wound/Ulcer of the Lower Extremity Wound Location: Left, Lateral Lower Leg Wound Status: Open Wounding Event: Trauma Comorbid History: Hypertension, Type II Diabetes Courtney [name], Courtney [name] ([id number]) [id number] Page 4 of 4 Date Acquired: 08/12/2021 Weeks Of Treatment: 5 Clustered Wound: No Wound Measurements Length: (cm) 2 Width: (cm) 0.4 Depth: (cm) 0.1 Area: (cm) 0.628 Volume: (cm) 0.063 % Reduction in Area: 84.8% % Reduction in Volume: 96.2% Wound Description Classification: Grade 1 Exudate Amount: Medium Exudate Type: Serosanguineous Exudate Color: red, brown Foul Odor After Cleansing: No Slough/Fibrino Yes Wound Bed Granulation Amount: Large (67-100%) Exposed Structure Granulation Quality: Red, Pink Fascia Exposed: No Necrotic Amount: Small (1-33%) Fat Layer (Subcutaneous Tissue) Exposed: Yes Necrotic Quality: Adherent Slough Tendon Exposed: No Muscle Exposed: No Joint Exposed: No Bone Exposed: No Treatment Notes Wound #1 (Lower Leg) Wound Laterality: Left, Lateral Cleanser Peri-Wound Care AandD Ointment Discharge Instruction: Apply AandD Ointment  as directed Topical Primary Dressing Prisma 4.34 (in) Discharge Instruction: Moisten w/normal saline or sterile water; Cover wound as directed. Do not remove from wound bed. Secondary Dressing ABD Pad 5x9 (in/in) Discharge Instruction: Cover with ABD pad Secured With Compression Wrap 3-LAYER WRAP - Profore Lite LF 3 Multilayer Compression Bandaging System Discharge Instruction: Apply 3 multi-layer wrap as prescribed. Compression Stockings Add-Ons Electronic Signature(s) Signed: 01/26/2022 6:11:46 PM By: Gretta Cool, BSN, RN, CWS, [name] RN, BSN Signed: 01/27/2022 9:50:53 AM By: Massie Kluver Entered By: Massie Kluver on 01/26/2022 11:55:28

## 2022-01-31 ENCOUNTER — Other Ambulatory Visit (INDEPENDENT_AMBULATORY_CARE_PROVIDER_SITE_OTHER): Payer: Self-pay | Admitting: Nurse Practitioner

## 2022-01-31 DIAGNOSIS — I739 Peripheral vascular disease, unspecified: Secondary | ICD-10-CM

## 2022-02-03 ENCOUNTER — Ambulatory Visit (INDEPENDENT_AMBULATORY_CARE_PROVIDER_SITE_OTHER): Payer: Medicare Other

## 2022-02-03 ENCOUNTER — Encounter: Payer: Medicare Other | Admitting: Physician Assistant

## 2022-02-03 ENCOUNTER — Encounter (INDEPENDENT_AMBULATORY_CARE_PROVIDER_SITE_OTHER): Payer: Self-pay | Admitting: Nurse Practitioner

## 2022-02-03 ENCOUNTER — Ambulatory Visit (INDEPENDENT_AMBULATORY_CARE_PROVIDER_SITE_OTHER): Payer: Medicare Other | Admitting: Nurse Practitioner

## 2022-02-03 VITALS — BP 187/76 | HR 74 | Resp 17 | Ht 67.0 in | Wt 191.0 lb

## 2022-02-03 DIAGNOSIS — M7550 Bursitis of unspecified shoulder: Secondary | ICD-10-CM | POA: Insufficient documentation

## 2022-02-03 DIAGNOSIS — I739 Peripheral vascular disease, unspecified: Secondary | ICD-10-CM

## 2022-02-03 DIAGNOSIS — Z09 Encounter for follow-up examination after completed treatment for conditions other than malignant neoplasm: Secondary | ICD-10-CM | POA: Diagnosis not present

## 2022-02-03 DIAGNOSIS — I10 Essential (primary) hypertension: Secondary | ICD-10-CM

## 2022-02-03 DIAGNOSIS — E119 Type 2 diabetes mellitus without complications: Secondary | ICD-10-CM | POA: Diagnosis not present

## 2022-02-03 NOTE — Progress Notes (Signed)
Subjective:    Patient ID: Courtney [name], female    DOB: [birth year], 82 y.o.   MRN: [id number] Chief Complaint  Patient presents with   Follow-up    ultrasound    Courtney [name] is an 82 year old female who returns today for evaluation of a wound on her left lower extremity.  At her last office visit she had an appointment with wound care.  She has been seeing wound.  Her wound is now completely healed.  She does endorse having some issues with swelling but it has been improved with the use of the Tubigrip device that she has and she has been wearing compression on her right leg.   When she The patient continues to have noncompressible ABIs bilaterally today.  She has a TBI 0.53 on the right and a TBI 0.49 on the left.  The patient has primarily biphasic waveforms bilaterally with an occluded distal PTA on the right and monophasic waveforms in the distal tibials on the left    Review of Systems  Cardiovascular:  Positive for leg swelling.  All other systems reviewed and are negative.      Objective:   Physical Exam Vitals reviewed.  HENT:     Head: Normocephalic.  Cardiovascular:     Rate and Rhythm: Normal rate.     Pulses:          Dorsalis pedis pulses are detected w/ Doppler on the right side and detected w/ Doppler on the left side.       Posterior tibial pulses are detected w/ Doppler on the right side and detected w/ Doppler on the left side.  Pulmonary:     Effort: Pulmonary effort is normal.  Skin:    General: Skin is warm and dry.       Neurological:     Mental Status: She is alert and oriented to person, place, and time.  Psychiatric:        Mood and Affect: Mood normal.        Behavior: Behavior normal.        Thought Content: Thought content normal.        Judgment: Judgment normal.     BP (!) 187/76 (BP Location: Right Arm)   Pulse 74   Resp 17   Ht '5\' 7"'$  (1.702 m)   Wt 191 lb (86.6 kg)   BMI 29.91 kg/m   Past Medical History:  Diagnosis Date    Arthritis    rheumatoid arthritis   B12 deficiency    Cancer (HCC)    skin cancer (squamous cell)   Collagen vascular disease (HCC)    RA   Dermatitis    Diabetes mellitus without complication (HCC)    type 2   Eczema    Fibrosis, pulmonary, interstitial, diffuse (HCC)    GERD (gastroesophageal reflux disease)    Headache    migraines in the past (none since menopause)   History of hiatal hernia    Hives    "chronic"   Hypertension    Loose stools    Osteoarthritis    lumbar spine, cervical spine, plantar fascitis    Pneumonia    PONV (postoperative nausea and vomiting)    Psoriasis    Rosacea    Sleep apnea 2004   does not use cpap   Urethral stenosis    w/ bladder polyps. followed by Dr Madelin Headings    Urticaria    chronic    Varicose veins  Social History   Socioeconomic History   Marital status: Married    Spouse name: Not on file   Number of children: Not on file   Years of education: Not on file   Highest education level: Not on file  Occupational History   Not on file  Tobacco Use   Smoking status: Never   Smokeless tobacco: Never  Vaping Use   Vaping Use: Never used  Substance and Sexual Activity   Alcohol use: No   Drug use: No   Sexual activity: Not on file  Other Topics Concern   Not on file  Social History Narrative   Not on file   Social Determinants of Health   Financial Resource Strain: Not on file  Food Insecurity: Not on file  Transportation Needs: Not on file  Physical Activity: Not on file  Stress: Not on file  Social Connections: Not on file  Intimate Partner Violence: Not on file    Past Surgical History:  Procedure Laterality Date   ANTERIOR CERVICAL DECOMP/DISCECTOMY FUSION N/A 08/20/2017   Procedure: Anterior Cervical Decompression Fusion - Cervical Four - Cervical Five - Cervical Five - Cervical Six;  Surgeon: Earnie Larsson, MD;  Location: [facility];  Service: Neurosurgery;  Laterality: N/A;  Anterior Cervical Decompression  Fusion - Cervical Four - Cervical Five - Cervical Five - Cervical Six   APPENDECTOMY     BILATERAL CARPAL TUNNEL RELEASE     BREAST CYST ASPIRATION Left    neg   CATARACT EXTRACTION Bilateral    CHOLECYSTECTOMY  1963   COLONOSCOPY WITH PROPOFOL N/A 12/31/2015   Procedure: COLONOSCOPY WITH PROPOFOL;  Surgeon: Lollie Sails, MD;  Location: [facility] ENDOSCOPY;  Service: Endoscopy;  Laterality: N/A;   ENDOMETRIAL ABLATION  1991   ESOPHAGOGASTRODUODENOSCOPY (EGD) WITH PROPOFOL N/A 12/31/2015   Procedure: ESOPHAGOGASTRODUODENOSCOPY (EGD) WITH PROPOFOL;  Surgeon: Lollie Sails, MD;  Location: [facility] ENDOSCOPY;  Service: Endoscopy;  Laterality: N/A;   ESOPHAGOGASTRODUODENOSCOPY (EGD) WITH PROPOFOL N/A 06/02/2016   Procedure: ESOPHAGOGASTRODUODENOSCOPY (EGD) WITH PROPOFOL;  Surgeon: Lollie Sails, MD;  Location[facility] ENDOSCOPY;  Service: Endoscopy;  Laterality: N/A;   ESOPHAGOGASTRODUODENOSCOPY (EGD) WITH PROPOFOL N/A 11/07/2016   Procedure: ESOPHAGOGASTRODUODENOSCOPY (EGD) WITH PROPOFOL;  Surgeon: Lollie Sails, MD;  Location: [facility] ENDOSCOPY;  Service: Endoscopy;  Laterality: N/A;   EYE SURGERY Bilateral 2006 and 2012   cataract surgery with lens implant   eyelid surgery Bilateral 2014   FOOT SURGERY Right    ligament and spurs   LUMBAR LAMINECTOMY/DECOMPRESSION MICRODISCECTOMY Right 12/24/2013   Procedure: RIGHT LUMBAR THREE TO FOUR, LUMBAR FOUR TO FIVE, LUMBAR FIVE TO SACRAL ONE LAMINECTOMY/FORAMINOTOMY;  Surgeon: Floyce Stakes, MD;  Location: [place] NEURO ORS;  Service: Neurosurgery;  Laterality: Right;  RIGHT L3-4 L4-5 L5-S1 LAMINECTOMY/FORAMINOTOMY   NASAL SEPTUM SURGERY  2004   SHOULDER SURGERY Right    for a frozen shoulder   SKIN CANCER EXCISION Right    leg x 4   skin cancer removal     TONSILLECTOMY     TUBAL LIGATION  1968    Family History  Problem Relation Age of Onset   COPD Mother    Arthritis/Rheumatoid Mother    Alzheimer's disease Father    Heart attack  Father    Breast cancer Paternal Aunt 26    Allergies  Allergen Reactions   Methotrexate Derivatives Shortness Of Breath    Breathing difficulties    Acyclovir And Related Rash   Codeine Nausea Only   Contrast Media [  Iodinated Contrast Media] Itching and Rash   Inderide [Propranolol-Hctz] Rash   Lodine [Etodolac] Rash   Metrizamide Itching and Rash   Procardia [Nifedipine] Rash       Latest Ref Rng & Units 09/02/2021    6:57 PM 06/23/2020   11:17 AM 08/13/2017    2:30 PM  CBC  WBC 4.0 - 10.5 K/uL 10.4  7.8  6.7   Hemoglobin 12.0 - 15.0 g/dL 12.2  13.9  12.6   Hematocrit 36.0 - 46.0 % 39.7  43.7  40.6   Platelets 150 - 400 K/uL 155  109  127       CMP     Component Value Date/Time   NA 140 09/02/2021 1857   K 4.1 09/02/2021 1857   CL 108 09/02/2021 1857   CO2 24 09/02/2021 1857   GLUCOSE 78 09/02/2021 1857   BUN 23 09/02/2021 1857   CREATININE 0.81 09/02/2021 1857   CALCIUM 9.6 09/02/2021 1857   PROT 6.8 09/02/2021 2257   ALBUMIN 3.3 (L) 09/02/2021 2257   AST 25 09/02/2021 2257   ALT 16 09/02/2021 2257   ALKPHOS 109 09/02/2021 2257   BILITOT 0.8 09/02/2021 2257   GFRNONAA >60 09/02/2021 1857   GFRAA >60 08/13/2017 1430     No results found.     Assessment & Plan:   1. PVD (peripheral vascular disease) ([place]) The patient is devious wound is well-healed today.  As discussed with the patient she does have some evidence of peripheral arterial disease but it is not significant at this time.  She does not have any claudication or rest pain.  Based on this we will not plan on any revascularization at this time.  Will have the patient return in 6 months with noninvasive studies.  Next - VAS [place] ABI WITH/WO TBI  2. Essential hypertension Continue antihypertensive medications as already ordered, these medications have been reviewed and there are no changes at this time.  3. Type 2 diabetes mellitus without complication, without long-term current use of insulin  (HCC) Continue hypoglycemic medications as already ordered, these medications have been reviewed and there are no changes at this time.  Hgb A1C to be monitored as already arranged by primary service   Current Outpatient Medications on File Prior to Visit  Medication Sig Dispense Refill   acetaminophen (TYLENOL) 500 MG tablet Take 500 mg by mouth every 6 (six) hours as needed (for pain.).      Ascorbic Acid (VITAMIN C) 1000 MG tablet Take 1,000 mg by mouth daily.     aspirin EC 81 MG tablet Take 81 mg by mouth daily.     bisoprolol (ZEBETA) 10 MG tablet Take 1 tablet (10 mg total) by mouth daily. 30 tablet 1   cetirizine (ZYRTEC) 10 MG tablet Take 10 mg by mouth at bedtime.      furosemide (LASIX) 20 MG tablet Take 20 mg by mouth daily. 1 1/2 tablet     gabapentin (NEURONTIN) 100 MG capsule Take 200 mg by mouth at bedtime.     HYDROcodone-acetaminophen (NORCO) 10-325 MG tablet Take 1 tablet by mouth every 4 (four) hours as needed for moderate pain ((score 4 to 6)). 30 tablet 0   inFLIXimab (REMICADE) 100 MG injection Inject 500 mg into the vein every 6 (six) weeks.     insulin NPH Human (HUMULIN N,NOVOLIN N) 100 UNIT/ML injection Inject 0.4 mLs (40 Units total) into the skin daily before breakfast. (Patient taking differently: Inject 40 Units into the  skin in the morning and at bedtime.) 10 mL 11   leflunomide (ARAVA) 10 MG tablet Take 10 mg by mouth daily.     lisinopril (PRINIVIL,ZESTRIL) 20 MG tablet Take 20 mg by mouth daily.     montelukast (SINGULAIR) 10 MG tablet Take 10 mg by mouth daily.     pantoprazole (PROTONIX) 40 MG tablet Take 40 mg by mouth daily.     rOPINIRole (REQUIP) 0.5 MG tablet Take 0.5 mg by mouth at bedtime.     simvastatin (ZOCOR) 40 MG tablet Take 40 mg by mouth at bedtime.     spironolactone (ALDACTONE) 25 MG tablet Take 25 mg by mouth daily.     sulfamethoxazole-trimethoprim (BACTRIM DS) 800-160 MG tablet Take 1 tablet by mouth 2 (two) times daily. 20 tablet 0    tiZANidine (ZANAFLEX) 4 MG tablet Take 4 mg by mouth 3 (three) times daily as needed for muscle spasms.     levocetirizine (XYZAL) 5 MG tablet Take 5 mg by mouth at bedtime. (Patient not taking: Reported on 12/09/2021)     No current facility-administered medications on file prior to visit.    There are no Patient Instructions on file for this visit. No follow-ups on file.   Kris Hartmann, NP

## 2022-02-03 NOTE — Progress Notes (Addendum)
Courtney [name], Courtney [name] ([id number]) [id number] Page 1 of 6 Visit Report for 02/03/2022 Chief Complaint Document Details Patient Name: Date of Service: Courtney [name], Courtney [name] 02/03/2022 10:00 Courtney [name] Medical Record Number: [id number] Patient Account Number: [id number] Date of Birth/Sex: Treating RN: [birth year] (82 y.o. Drema Pry Primary Care Provider: Emily Filbert Other Clinician: Referring Provider: Treating Provider/Extender: Leatha Gilding in Treatment: 6 Information Obtained from: Patient Chief Complaint Left LE Ulcer Electronic Signature(s) Signed: 02/03/2022 10:24:29 AM By: Worthy Keeler PA-C Entered By: Worthy Keeler on 02/03/2022 10:24:29 -------------------------------------------------------------------------------- HPI Details Patient Name: Date of Service: Courtney [name]. 02/03/2022 10:00 [place] Record Number: [id number] Patient Account Number: [id number] Date of Birth/Sex: Treating RN: 1940-10-07 (82 y.o. Drema Pry Primary Care Provider: Emily Filbert Other Clinician: Referring Provider: Treating Provider/Extender: Leatha Gilding in Treatment: 6 History of Present Illness HPI Description: 12-22-2021 upon evaluation today patient appears to be doing well currently in regard to her wound all things considered especially in light of the picture that they showed me from August when she initially had [name] skin tear. Fortunately there does not appear to be any signs of active infection locally nor systemically at this time which is great news and overall I am extremely pleased with the fact that she is making some progress here she has been placed on Cipro due to [name] culture result that was done by rheumatologist which showed that she had Pseudomonas and Staphylococcus aureus noted as the organisms in question. Fortunately I do not think that there is anything that seems to be still present or significant here I think  she is actually doing much better which is great news. With that being said the patient tells me that she has been using Medihoney at this point which actually is really good does help to clear up [name] lot of this I do believe. In the beginning she was also placed on or rather in an Unna boot wrap based on what we are being told that was for [name] couple of weeks and then she did not have any additional follow-up with primary eventually rheumatology who were doing injections for her realize that she had [name] wound they stopped the injections which obviously would lower her immune response and healing chances. That is when she started on the antibiotics and subsequently also was referred to vascular. The good news is from [name] vascular standpoint she seems to be probably sufficient to heal with [name] right ABI of 1.02 and [name] left ABI of 1.02 as well. Her TBI on the right was 0.63 and on the left 0.54. She does have [name] history of diabetes mellitus type 2 and she has an A1c of 6.5 on 10-17-2021. Other than this she also does have chronic venous insufficiency her leg is swollen today I definitely think [name] compression wraps probably can help her she also has hypertension 12-29-2021 upon evaluation patient's wound is actually showing signs of excellent improvement. Fortunately I do not see any signs of infection I think she is making good progress here and overall I do believe that she is really looking quite nice as far as the overall appearance of the wound bed is concerned. There SIDRAH, HARDEN [name] ([id number]) [id number] Page 2 of 6 is [name] little bit of necrotic tissue noted on the surface of the wound that is going require debridement today. 12/28; continued improvement in the area on the left lateral leg which was initially [name] skin  tear in the setting of chronic venous insufficiency. She has been using Prisma with 3 layer compression. The length of the wound is down 0.5 cm 01-12-2022 upon evaluation  today patient appears to be doing well currently in regard to her leg ulcer. She has been tolerating dressing changes without complication. Fortunately I do not see any signs of active infection locally nor systemically which is great news. No fevers, chills, nausea, vomiting, or diarrhea. 01-20-2022 upon evaluation today patient appears to be doing well currently in regard to her wound. In fact this is measuring significantly smaller and I am very pleased with where we stand. I do not see any signs of active infection locally or systemically at this time. No fevers, chills, nausea, vomiting, or diarrhea. 02-03-2022 upon evaluation today patient appears to be doing well currently in regard to her wound. In fact she appears to be completely healed based on what I am seeing. Fortunately I do not see any signs of active infection locally nor systemically which is great news. There is little piece of dried skin where the dressing had stuck once I remove this however there was nothing remaining underneath. Electronic Signature(s) Signed: 02/03/2022 1:40:45 PM By: Worthy Keeler PA-C Entered By: Worthy Keeler on 02/03/2022 13:40:45 -------------------------------------------------------------------------------- Physical Exam Details Patient Name: Date of Service: Courtney [name], Courtney [name] 02/03/2022 10:00 Courtney [name] Medical Record Number: [id number] Patient Account Number: [id number] Date of Birth/Sex: Treating RN: Aug 22, 1940 (82 y.o. Drema Pry Primary Care Provider: Emily Filbert Other Clinician: Referring Provider: Treating Provider/Extender: Leatha Gilding in Treatment: 6 Constitutional Well-nourished and well-hydrated in no acute distress. Respiratory normal breathing without difficulty. Psychiatric this patient is able to make decisions and demonstrates good insight into disease process. Alert and Oriented x 3. pleasant and cooperative. Notes Upon inspection patient's wound bed  actually showed signs of good granulation and epithelization at this point. Fortunately I see no signs of active infection and overall I think she is really making good progress. No fevers, chills, nausea, vomiting, or diarrhea. Electronic Signature(s) Signed: 02/03/2022 1:41:01 PM By: Worthy Keeler PA-C Entered By: Worthy Keeler on 02/03/2022 13:41:01 -------------------------------------------------------------------------------- Physician Orders Details Patient Name: Date of Service: Courtney [name]. 02/03/2022 10:00 [name] Courtney [name], Courtney [name] ([id number]) [id number] Page 3 of 6 Medical Record Number: [id number] Patient Account Number: [id number] Date of Birth/Sex: Treating RN: [birth year] (82 y.o. Drema Pry Primary Care Provider: Emily Filbert Other Clinician: Referring Provider: Treating Provider/Extender: Leatha Gilding in Treatment: 6 Verbal / Phone Orders: No Diagnosis Coding ICD-10 Coding Code Description E11.622 Type 2 diabetes mellitus with other skin ulcer I87.332 Chronic venous hypertension (idiopathic) with ulcer and inflammation of left lower extremity I73.89 Other specified peripheral vascular diseases L97.822 Non-pressure chronic ulcer of other part of left lower leg with fat layer exposed I10 Essential (primary) hypertension Discharge From [name] Discharge from [facility] - Call if needed. Continue tubi grip for 1-2 weeks before changing to compression sock. Electronic Signature(s) Signed: 02/03/2022 12:44:53 PM By: Rosalio Loud MSN RN CNS WTA Signed: 02/03/2022 2:17:33 PM By: Worthy Keeler PA-C Entered By: Rosalio Loud on 02/03/2022 10:40:27 -------------------------------------------------------------------------------- Problem List Details Patient Name: Date of Service: Courtney [name]. 02/03/2022 10:00 Courtney [name] Medical Record Number: [id number] Patient Account Number: [id number] Date of  Birth/Sex: Treating RN: 06/16/1940 (82 y.o. Drema Pry Primary Care Provider: Emily Filbert Other Clinician: Referring Provider: Treating Provider/Extender: Bonne Dolores  Weeks in Treatment: 6 Active Problems ICD-10 Encounter Code Description Active Date MDM Diagnosis E11.622 Type 2 diabetes mellitus with other skin ulcer 12/22/2021 No Yes I87.332 Chronic venous hypertension (idiopathic) with ulcer and inflammation of left 12/22/2021 No Yes lower extremity I73.89 Other specified peripheral vascular diseases 12/22/2021 No Yes L97.822 Non-pressure chronic ulcer of other part of left lower leg with fat layer 12/22/2021 No Yes exposed [place] (primary) hypertension 12/22/2021 No Yes Sulak, Rozalynn [name] ([id number]) [id number] Page 4 of 6 Inactive Problems Resolved Problems Electronic Signature(s) Signed: 02/03/2022 12:44:53 PM By: Rosalio Loud MSN RN CNS WTA Signed: 02/03/2022 2:17:33 PM By: Worthy Keeler PA-C Previous Signature: 02/03/2022 10:24:26 AM Version By: Worthy Keeler PA-C Entered By: Rosalio Loud on 02/03/2022 10:41:59 -------------------------------------------------------------------------------- Progress Note Details Patient Name: Date of Service: Courtney [name]. 02/03/2022 10:00 Courtney [name] Medical Record Number: [id number] Patient Account Number: [id number] Date of Birth/Sex: Treating RN: [birth year] (82 y.o. Drema Pry Primary Care Provider: Emily Filbert Other Clinician: Referring Provider: Treating Provider/Extender: Leatha Gilding in Treatment: 6 Subjective Chief Complaint Information obtained from Patient Left LE Ulcer History of Present Illness (HPI) 12-22-2021 upon evaluation today patient appears to be doing well currently in regard to her wound all things considered especially in light of the picture that they showed me from August when she initially had [name] skin tear. Fortunately there does not  appear to be any signs of active infection locally nor systemically at this time which is great news and overall I am extremely pleased with the fact that she is making some progress here she has been placed on Cipro due to [name] culture result that was done by rheumatologist which showed that she had Pseudomonas and Staphylococcus aureus noted as the organisms in question. Fortunately I do not think that there is anything that seems to be still present or significant here I think she is actually doing much better which is great news. With that being said the patient tells me that she has been using Medihoney at this point which actually is really good does help to clear up [name] lot of this I do believe. In the beginning she was also placed on or rather in an Unna boot wrap based on what we are being told that was for [name] couple of weeks and then she did not have any additional follow-up with primary eventually rheumatology who were doing injections for her realize that she had [name] wound they stopped the injections which obviously would lower her immune response and healing chances. That is when she started on the antibiotics and subsequently also was referred to vascular. The good news is from [name] vascular standpoint she seems to be probably sufficient to heal with [name] right ABI of 1.02 and [name] left ABI of 1.02 as well. Her TBI on the right was 0.63 and on the left 0.54. She does have [name] history of diabetes mellitus type 2 and she has an A1c of 6.5 on 10-17-2021. Other than this she also does have chronic venous insufficiency her leg is swollen today I definitely think [name] compression wraps probably can help her she also has hypertension 12-29-2021 upon evaluation patient's wound is actually showing signs of excellent improvement. Fortunately I do not see any signs of infection I think she is making good progress here and overall I do believe that she is really looking quite nice as far as the overall appearance of the  wound bed is concerned. There is  [name] little bit of necrotic tissue noted on the surface of the wound that is going require debridement today. 12/28; continued improvement in the area on the left lateral leg which was initially [name] skin tear in the setting of chronic venous insufficiency. She has been using Prisma with 3 layer compression. The length of the wound is down 0.5 cm 01-12-2022 upon evaluation today patient appears to be doing well currently in regard to her leg ulcer. She has been tolerating dressing changes without complication. Fortunately I do not see any signs of active infection locally nor systemically which is great news. No fevers, chills, nausea, vomiting, or diarrhea. 01-20-2022 upon evaluation today patient appears to be doing well currently in regard to her wound. In fact this is measuring significantly smaller and I am very pleased with where we stand. I do not see any signs of active infection locally or systemically at this time. No fevers, chills, nausea, vomiting, or diarrhea. 02-03-2022 upon evaluation today patient appears to be doing well currently in regard to her wound. In fact she appears to be completely healed based on what I am seeing. Fortunately I do not see any signs of active infection locally nor systemically which is great news. There is little piece of dried skin where the dressing had stuck once I remove this however there was nothing remaining underneath. Courtney [name], Courtney [name] ([id number]) [id number] Page 5 of 6 Objective Constitutional Well-nourished and well-hydrated in no acute distress. Vitals Time Taken: 10:11 AM, Weight: 206 lbs, Temperature: 98.4 F, Pulse: 76 bpm, Respiratory Rate: 16 breaths/min, Blood Pressure: 180/85 mmHg. Respiratory normal breathing without difficulty. Psychiatric this patient is able to make decisions and demonstrates good insight into disease process. Alert and Oriented x 3. pleasant and  cooperative. General Notes: Upon inspection patient's wound bed actually showed signs of good granulation and epithelization at this point. Fortunately I see no signs of active infection and overall I think she is really making good progress. No fevers, chills, nausea, vomiting, or diarrhea. Integumentary (Hair, Skin) Wound #1 status is Healed - Epithelialized. Original cause of wound was Trauma. The date acquired was: 08/12/2021. The wound has been in treatment 6 weeks. The wound is located on the Left,Lateral Lower Leg. The wound measures 0cm length x 0cm width x 0cm depth; 0cm^2 area and 0cm^3 volume. There is Fat Layer (Subcutaneous Tissue) exposed. There is [name] medium amount of serosanguineous drainage noted. There is large (67-100%) red, pink granulation within the wound bed. There is [name] small (1-33%) amount of necrotic tissue within the wound bed. Assessment Active Problems ICD-10 Type 2 diabetes mellitus with other skin ulcer Chronic venous hypertension (idiopathic) with ulcer and inflammation of left lower extremity Other specified peripheral vascular diseases Non-pressure chronic ulcer of other part of left lower leg with fat layer exposed Essential (primary) hypertension Plan Discharge From [facility]: Discharge from [facility] - Call if needed. Continue tubi grip for 1-2 weeks before changing to compression sock. 1. The patient appears to be completely healed which is great news and I think she is ready for discharge. 2. I am good recommend use AandD ointment over the area and continue with compression as well she use Tubigrip currently and then she will get her compression sock as soon as the area is toughen up in about 1 to 2 weeks. We will see patient back for reevaluation in 1 week here in we will see her back for follow-up visit as needed.. Electronic Signature(s) Signed:  02/03/2022 1:41:29 PM By: Worthy Keeler PA-C Entered By: Worthy Keeler on  02/03/2022 13:41:29 -------------------------------------------------------------------------------- SuperBill Details Patient Name: Date of Service: Courtney [name] 02/03/2022 Medical Record Number: [id number] Patient Account Number: [id number] Date of Birth/Sex: Treating RN: [birth year] (82 y.o. Courtney [name], Courtney [name], Courtney [name] ([id number]) [id number] Page 6 of 6 Primary Care Provider: Emily Filbert Other Clinician: Referring Provider: Treating Provider/Extender: Leatha Gilding in Treatment: 6 Diagnosis Coding ICD-10 Codes Code Description E11.622 Type 2 diabetes mellitus with other skin ulcer I87.332 Chronic venous hypertension (idiopathic) with ulcer and inflammation of left lower extremity I73.89 Other specified peripheral vascular diseases L97.822 Non-pressure chronic ulcer of other part of left lower leg with fat layer exposed I10 Essential (primary) hypertension Facility Procedures : CPT4 Code: [id number] Description: 33354 - WOUND CARE VISIT-LEV 2 EST PT Modifier: Quantity: 1 Physician Procedures : CPT4 Code Description Modifier [id number] - WC PHYS LEVEL 3 - EST PT ICD-10 Diagnosis Description E11.622 Type 2 diabetes mellitus with other skin ulcer I87.332 Chronic venous hypertension (idiopathic) with ulcer and inflammation of left lower  extremity I73.89 Other specified peripheral vascular diseases L97.822 Non-pressure chronic ulcer of other part of left lower leg with fat layer exposed Quantity: 1 Electronic Signature(s) Signed: 02/03/2022 1:42:18 PM By: Worthy Keeler PA-C Previous Signature: 02/03/2022 12:44:53 PM Version By: Rosalio Loud MSN RN CNS WTA Entered By: Worthy Keeler on 02/03/2022 13:42:18

## 2022-02-03 NOTE — Progress Notes (Signed)
Courtney [name] [name] ([id number]) [id number] Page 1 of 8 Visit Report for 02/03/2022 Arrival Information Details Patient Name: Date of Service: Courtney [name], Courtney [name] 02/03/2022 10:00 [name] Medical Record Number: [id number] Patient Account Number: [id number] Date of Birth/Sex: Treating RN: 10/15/1940 (82 y.o. Courtney [name] Primary Care Courtney [name]: Courtney [name] Other Clinician: Referring Courtney [name]: Treating Courtney [name]/Extender: Courtney [name] in Treatment: 6 Visit Information History Since Last Visit Added or deleted any medications: No Patient Arrived: Courtney [name] Any new allergies or adverse reactions: No Arrival Time: 10:08 Had [name] fall or experienced change in No Accompanied By: husband activities of daily living that may affect Transfer Assistance: None risk of falls: Patient Requires Transmission-Based Precautions: No Hospitalized since last visit: No Patient Has Alerts: Yes Has Dressing in Place as Prescribed: Yes Patient Alerts: ABI R1.02 TBI .63 12/09/21 Pain Present Now: No ABI L 1.02TBI .54 12/09/21 Electronic Signature(s) Signed: 02/03/2022 12:44:53 PM By: Courtney Loud MSN RN CNS WTA Entered By: Courtney [name] on 02/03/2022 10:38:56 -------------------------------------------------------------------------------- Clinic Level of Care Assessment Details Patient Name: Date of Service: Courtney [name] 02/03/2022 10:00 [name] Medical Record Number: [id number] Patient Account Number: [id number] Date of Birth/Sex: Treating RN: [birth year] (82 y.o. Courtney [name] Primary Care Courtney [name]: Courtney [name] Other Clinician: Referring Courtney [name]: Treating Courtney [name]: Courtney [name] in Treatment: 6 Clinic Level of Care Assessment Items TOOL 4 Quantity Score X- 1 0 Use when only an EandM is performed on FOLLOW-UP visit ASSESSMENTS - Nursing Assessment / Reassessment X- 1 10 Reassessment of Co-morbidities (includes updates in patient status) X- 1  5 Reassessment of Adherence to Treatment Plan ASSESSMENTS - Wound and Skin [name] ssessment / Reassessment X - Simple Wound Assessment / Reassessment - one wound 1 5 '[]'$  - 0 Complex Wound Assessment / Reassessment - multiple wounds [name], Courtney [name] ([id number]) [id number] Page 2 of 8 '[]'$  - 0 Dermatologic / Skin Assessment (not related to wound area) ASSESSMENTS - Focused Assessment '[]'$  - 0 Circumferential Edema Measurements - multi extremities '[]'$  - 0 Nutritional Assessment / Counseling / Intervention '[]'$  - 0 Lower Extremity Assessment (monofilament, tuning fork, pulses) '[]'$  - 0 Peripheral Arterial Disease Assessment (using hand held doppler) ASSESSMENTS - Ostomy and/or Continence Assessment and Care '[]'$  - 0 Incontinence Assessment and Management '[]'$  - 0 Ostomy Care Assessment and Management (repouching, etc.) PROCESS - Coordination of Care X - Simple Patient / Family Education for ongoing care 1 15 '[]'$  - 0 Complex (extensive) Patient / Family Education for ongoing care X- 1 10 Staff obtains Programmer, systems, Records, T Results / Process Orders est '[]'$  - 0 Staff telephones HHA, Nursing Homes / Clarify orders / etc '[]'$  - 0 Routine Transfer to another Facility (non-emergent condition) '[]'$  - 0 Routine Hospital Admission (non-emergent condition) '[]'$  - 0 New Admissions / Biomedical engineer / Ordering NPWT Apligraf, etc. , '[]'$  - 0 Emergency Hospital Admission (emergent condition) X- 1 10 Simple Discharge Coordination '[]'$  - 0 Complex (extensive) Discharge Coordination PROCESS - Special Needs '[]'$  - 0 Pediatric / Minor Patient Management '[]'$  - 0 Isolation Patient Management '[]'$  - 0 Hearing / Language / Visual special needs '[]'$  - 0 Assessment of Community assistance (transportation, D/C planning, etc.) '[]'$  - 0 Additional assistance / Altered mentation '[]'$  - 0 Support Surface(s) Assessment (bed, cushion, seat, etc.) INTERVENTIONS - Wound Cleansing / Measurement X -  Simple Wound Cleansing - one wound 1 5 '[]'$  - 0 Complex Wound Cleansing - multiple wounds X- 1 5 Wound Imaging (photographs -  any number of wounds) '[]'$  - 0 Wound Tracing (instead of photographs) X- 1 5 Simple Wound Measurement - one wound '[]'$  - 0 Complex Wound Measurement - multiple wounds INTERVENTIONS - Wound Dressings '[]'$  - 0 Small Wound Dressing one or multiple wounds '[]'$  - 0 Medium Wound Dressing one or multiple wounds '[]'$  - 0 Large Wound Dressing one or multiple wounds '[]'$  - 0 Application of Medications - topical '[]'$  - 0 Application of Medications - injection INTERVENTIONS - Miscellaneous '[]'$  - 0 External ear exam '[]'$  - 0 Specimen Collection (cultures, biopsies, blood, body fluids, etc.) '[]'$  - 0 Specimen(s) / Culture(s) sent or taken to Lab for analysis Courtney [name], Courtney [name] [name] ([id number]) [id number] Page 3 of 8 '[]'$  - 0 Patient Transfer (multiple staff / Civil Service fast streamer / Similar devices) '[]'$  - 0 Simple Staple / Suture removal (25 or less) '[]'$  - 0 Complex Staple / Suture removal (26 or more) '[]'$  - 0 Hypo / Hyperglycemic Management (close monitor of Blood Glucose) '[]'$  - 0 Ankle / Brachial Index (ABI) - do not check if billed separately X- 1 5 Vital Signs Has the patient been seen at the hospital within the last three years: Yes Total Score: 75 Level Of Care: New/Established - Level 2 Electronic Signature(s) Signed: 02/03/2022 12:44:53 PM By: Courtney Loud MSN RN CNS WTA Entered By: Courtney [name] on 02/03/2022 10:41:06 -------------------------------------------------------------------------------- Encounter Discharge Information Details Patient Name: Date of Service: Courtney [name]. 02/03/2022 10:00 [place] Record Number: [id number] Patient Account Number: [id number] Date of Birth/Sex: Treating RN: 1940-04-08 (82 y.o. Courtney [name] Primary Care Andrell Tallman: Courtney [name] Other Clinician: Referring Leatta Alewine: Treating Kaushal Vannice/Extender: Courtney [name] in Treatment: 6 Encounter Discharge Information Items Discharge Condition: Stable Ambulatory Status: Cane Discharge Destination: Home Transportation: Private Auto Accompanied By: husband Schedule Follow-up Appointment: No Clinical Summary of Care: Electronic Signature(s) Signed: 02/03/2022 12:44:53 PM By: Courtney Loud MSN RN CNS WTA Entered By: Courtney [name] on 02/03/2022 10:42:28 -------------------------------------------------------------------------------- Lower Extremity Assessment Details Patient Name: Date of Service: Courtney [name]. 02/03/2022 10:00 [place] Record Number: [id number] Patient Account Number: [id number] Date of Birth/Sex: Treating RN: 1940/11/11 (82 y.o. Courtney [name] Primary Care Courtez Twaddle: Courtney [name] Other Clinician: Referring Lashan Macias: Treating Maudy Yonan/Extender: Samaira, Holzworth [name] ([id number]) [id number] Page 4 of 8 Weeks in Treatment: 6 Edema Assessment Assessed: [Left: No] [Right: No] [Left: Edema] [Right: :] Calf Left: Right: Point of Measurement: 32 cm From Medial Instep 35 cm Ankle Left: Right: Point of Measurement: 10 cm From Medial Instep 24 cm Vascular Assessment Pulses: Dorsalis Pedis Palpable: [Left:Yes] Electronic Signature(s) Signed: 02/03/2022 12:44:53 PM By: Courtney Loud MSN RN CNS WTA Entered By: Courtney [name] on 02/03/2022 10:39:27 -------------------------------------------------------------------------------- Multi Wound Chart Details Patient Name: Date of Service: Courtney [name]. 02/03/2022 10:00 [name] Medical Record Number: [id number] Patient Account Number: [id number] Date of Birth/Sex: Treating RN: 1940/08/05 (82 y.o. Courtney [name] Primary Care Jillann Charette: Courtney [name] Other Clinician: Referring Garlan Drewes: Treating Suhaan Perleberg/Extender: Courtney [name] in Treatment: 6 Vital Signs Height(in): Pulse(bpm): 76 Weight(lbs): 206 Blood  Pressure(mmHg): 180/85 Body Mass Index(BMI): Temperature(F): 98.4 Respiratory Rate(breaths/min): 16 [1:Photos:] [N/[name]:N/[name]] Left, Lateral Lower Leg N/[name] N/[name] Wound Location: Trauma N/[name] N/[name] Wounding Event: Diabetic Wound/Ulcer of the Lower N/[name] N/[name] Primary Etiology: Extremity Hypertension, Type II Diabetes N/[name] N/[name] Comorbid History: 08/12/2021 N/[name] N/[name] Date Acquired: 6 N/[name] N/[name] Weeks of TreatmentBETHA, SHADIX [name] ([id number]) [id number] Page 5 of 8 Healed - Epithelialized N/[name] N/[name] Wound  Status: No N/[name] N/[name] Wound Recurrence: 0x0x0 N/[name] N/[name] Measurements L x W x D (cm) 0 N/[name] N/[name] [name] (cm) : rea 0 N/[name] N/[name] Volume (cm) : 100.00% N/[name] N/[name] % Reduction in [name] rea: 100.00% N/[name] N/[name] % Reduction in Volume: Grade 1 N/[name] N/[name] Classification: Medium N/[name] N/[name] Exudate [name] mount: Serosanguineous N/[name] N/[name] Exudate Type: red, brown N/[name] N/[name] Exudate Color: Large (67-100%) N/[name] N/[name] Granulation [name] mount: Red, Pink N/[name] N/[name] Granulation Quality: Small (1-33%) N/[name] N/[name] Necrotic [name] mount: Fat Layer (Subcutaneous Tissue): Yes N/[name] N/[name] Exposed Structures: Fascia: No Tendon: No Muscle: No Joint: No Bone: No Debridement - Excisional N/[name] N/[name] Debridement: Subcutaneous, Slough N/[name] N/[name] Tissue Debrided: Skin/Subcutaneous Tissue N/[name] N/[name] Level: 0.51 N/[name] N/[name] Debridement [name] (sq cm): rea Curette N/[name] N/[name] Instrument: None N/[name] N/[name] Bleeding: Debridement Treatment Response: Procedure was tolerated well N/[name] N/[name] Post Debridement Measurements L x 1.7x0.3x0.2 N/[name] N/[name] W x D (cm) 0.08 N/[name] N/[name] Post Debridement Volume: (cm) Debridement N/[name] N/[name] Procedures Performed: Treatment Notes Electronic Signature(s) Signed: 02/03/2022 12:44:53 PM By: Courtney Loud MSN RN CNS WTA Entered By: Courtney [name] on 02/03/2022 10:39:31 -------------------------------------------------------------------------------- Multi-Disciplinary Care Plan Details Patient Name: Date of Service: Courtney [name]. 02/03/2022 10:00  [name] Medical Record Number: [id number] Patient Account Number: [id number] Date of Birth/Sex: Treating RN: 23-Aug-1940 (82 y.o. Courtney [name] Primary Care Keesha Pellum: Courtney [name] Other Clinician: Referring Devarious Pavek: Treating Noya Santarelli/Extender: Courtney [name] in Treatment: 6 Active Inactive Electronic Signature(s) Signed: 02/03/2022 12:44:53 PM By: Courtney Loud MSN RN CNS WTA Entered By: Courtney [name] on 02/03/2022 10:41:43 [name] ([id number]) [id number] Page 6 of 8 -------------------------------------------------------------------------------- Pain Assessment Details Patient Name: Date of Service: Courtney [name], Courtney [name] 02/03/2022 10:00 [name] Medical Record Number: [id number] Patient Account Number: [id number] Date of Birth/Sex: Treating RN: April 18, 1940 (82 y.o. Courtney [name] Primary Care Miyako Oelke: Courtney [name] Other Clinician: Referring Pennie Vanblarcom: Treating Vinette Crites/Extender: Courtney [name] in Treatment: 6 Active Problems Location of Pain Severity and Description of Pain Patient Has Paino No Site Locations Pain Management and Medication Current Pain Management: Electronic Signature(s) Signed: 02/03/2022 12:44:53 PM By: Courtney Loud MSN RN CNS WTA Entered By: Courtney [name] on 02/03/2022 10:39:03 -------------------------------------------------------------------------------- Patient/Caregiver Education Details Patient Name: Date of Service: Courtney [name], Courtney [name]. 1/26/2024andnbsp10:00 [place] Record Number: [id number] Patient Account Number: [id number] Date of Birth/Gender: Treating RN: [birth year] (82 y.o. Courtney [name] Primary Care Physician: Courtney [name] Other Clinician: Referring Physician: Treating Physician/Extender: Courtney [name] in Treatment: 6 PATRECE, TALLIE [name] ([id number]) [id number] Page 7 of 8 Education Assessment Education Provided To: Patient Education Topics  Provided Engineer, maintenance) Signed: 02/03/2022 12:44:53 PM By: Courtney Loud MSN RN CNS WTA Entered By: Courtney [name] on 02/03/2022 10:41:25 -------------------------------------------------------------------------------- Wound Assessment Details Patient Name: Date of Service: Courtney [name]. 02/03/2022 10:00 [place] Record Number: [id number] Patient Account Number: [id number] Date of Birth/Sex: Treating RN: 1940/05/21 (82 y.o. Courtney [name] Primary Care Wylma Tatem: Courtney [name] Other Clinician: Referring Jairen Goldfarb: Treating Sahil Milner/Extender: Courtney [name] in Treatment: 6 Wound Status Wound Number: 1 Primary Etiology: Diabetic Wound/Ulcer of the Lower Extremity Wound Location: Left, Lateral Lower Leg Wound Status: Healed - Epithelialized Wounding Event: Trauma Comorbid History: Hypertension, Type II Diabetes Date Acquired: 08/12/2021 Weeks Of Treatment: 6 Clustered Wound: No Photos Wound Measurements Length: (cm) Width: (cm) Depth: (cm) Area: (cm) Volume: (cm) 0 % Reduction in Area: 100% 0 % Reduction in Volume: 100% 0 0 0 Wound Description  Classification: Grade 1 Exudate Amount: Medium Exudate Type: Serosanguineous Exudate Color: red, brown Foul Odor After Cleansing: No Slough/Fibrino Yes Wound Bed Granulation Amount: Large (67-100%) Exposed Structure Granulation Quality: Red, Pink Fascia Exposed: No Krajewski, Courtney [name] ([id number]) [phone].pdf Page 8 of 8 Necrotic Amount: Small (1-33%) Fat Layer (Subcutaneous Tissue) Exposed: Yes Tendon Exposed: No Muscle Exposed: No Joint Exposed: No Bone Exposed: No Treatment Notes Wound #1 (Lower Leg) Wound Laterality: Left, Lateral Cleanser Peri-Wound Care Topical Primary Dressing Secondary Dressing Secured With Compression Wrap Compression Stockings Add-Ons Electronic Signature(s) Signed: 02/03/2022 12:44:53 PM By: Courtney Loud MSN RN CNS WTA Entered By: Courtney [name] on 02/03/2022 10:39:22 -------------------------------------------------------------------------------- Vitals Details Patient Name: Date of Service: Courtney [name]. 02/03/2022 10:00 [name] Medical Record Number: [id number] Patient Account Number: [id number] Date of Birth/Sex: Treating RN: [birth year] (82 y.o. Courtney [name] Primary Care Duong Haydel: Courtney [name] Other Clinician: Referring Tyre Beaver: Treating Marua Qin/Extender: Courtney [name] in Treatment: 6 Vital Signs Time Taken: 10:11 Temperature (F): 98.4 Weight (lbs): 206 Pulse (bpm): 76 Respiratory Rate (breaths/min): 16 Blood Pressure (mmHg): 180/85 Reference Range: 80 - 120 mg / dl Electronic Signature(s) Signed: 02/03/2022 12:44:53 PM By: Courtney Loud MSN RN CNS WTA Entered By: Courtney [name] on 02/03/2022 10:38:59

## 2022-02-04 ENCOUNTER — Encounter (INDEPENDENT_AMBULATORY_CARE_PROVIDER_SITE_OTHER): Payer: Self-pay | Admitting: Nurse Practitioner

## 2022-02-06 LAB — VAS US ABI WITH/WO TBI

## 2022-02-09 ENCOUNTER — Other Ambulatory Visit: Payer: Self-pay | Admitting: Internal Medicine

## 2022-02-09 DIAGNOSIS — Z1231 Encounter for screening mammogram for malignant neoplasm of breast: Secondary | ICD-10-CM

## 2022-03-02 ENCOUNTER — Ambulatory Visit
Admission: RE | Admit: 2022-03-02 | Discharge: 2022-03-02 | Disposition: A | Discharge status: Home / Self Care | Payer: Medicare Other | Source: Ambulatory Visit | Attending: Internal Medicine | Admitting: Internal Medicine

## 2022-03-02 DIAGNOSIS — Z1231 Encounter for screening mammogram for malignant neoplasm of breast: Secondary | ICD-10-CM | POA: Insufficient documentation

## 2022-04-04 ENCOUNTER — Other Ambulatory Visit: Payer: Self-pay | Admitting: Family Medicine

## 2022-04-04 DIAGNOSIS — N644 Mastodynia: Secondary | ICD-10-CM

## 2022-04-04 DIAGNOSIS — N6452 Nipple discharge: Secondary | ICD-10-CM

## 2022-04-12 ENCOUNTER — Ambulatory Visit
Admission: RE | Admit: 2022-04-12 | Discharge: 2022-04-12 | Disposition: A | Discharge status: Home / Self Care | Payer: Medicare Other | Source: Ambulatory Visit | Attending: Family Medicine | Admitting: Family Medicine

## 2022-04-12 DIAGNOSIS — N6452 Nipple discharge: Secondary | ICD-10-CM | POA: Insufficient documentation

## 2022-04-12 DIAGNOSIS — N644 Mastodynia: Secondary | ICD-10-CM

## 2022-04-19 ENCOUNTER — Other Ambulatory Visit: Payer: Self-pay | Admitting: Internal Medicine

## 2022-04-19 DIAGNOSIS — N63 Unspecified lump in unspecified breast: Secondary | ICD-10-CM

## 2022-04-19 DIAGNOSIS — R928 Other abnormal and inconclusive findings on diagnostic imaging of breast: Secondary | ICD-10-CM

## 2022-04-24 ENCOUNTER — Ambulatory Visit
Admission: RE | Admit: 2022-04-24 | Discharge: 2022-04-24 | Disposition: A | Discharge status: Home / Self Care | Payer: Medicare Other | Source: Ambulatory Visit | Attending: Internal Medicine | Admitting: Internal Medicine

## 2022-04-24 DIAGNOSIS — N63 Unspecified lump in unspecified breast: Secondary | ICD-10-CM

## 2022-04-24 DIAGNOSIS — R928 Other abnormal and inconclusive findings on diagnostic imaging of breast: Secondary | ICD-10-CM

## 2022-04-24 HISTORY — PX: BREAST BIOPSY: SHX20

## 2022-04-24 MED ORDER — LIDOCAINE HCL (PF) 1 % IJ SOLN
10.0000 mL | Freq: Once | INTRAMUSCULAR | Status: AC
  Administered 2022-04-24: 10 mL via INTRADERMAL

## 2022-04-25 LAB — SURGICAL PATHOLOGY

## 2022-05-10 ENCOUNTER — Other Ambulatory Visit: Payer: Self-pay | Admitting: General Surgery

## 2022-05-10 DIAGNOSIS — N6452 Nipple discharge: Secondary | ICD-10-CM

## 2022-05-21 IMAGING — RF DG C-ARM 1-60 MIN
1 series · 2 of 2 positions shown · non-contrast
Comparison: Preoperative CT 06/05/2020

CLINICAL DATA: Elective surgery.

EXAM:
LUMBAR SPINE - 2-3 VIEW; DG C-ARM 1-60 MIN

[Series 1: run · 2 of 2 slices shown]
[im 1/2]
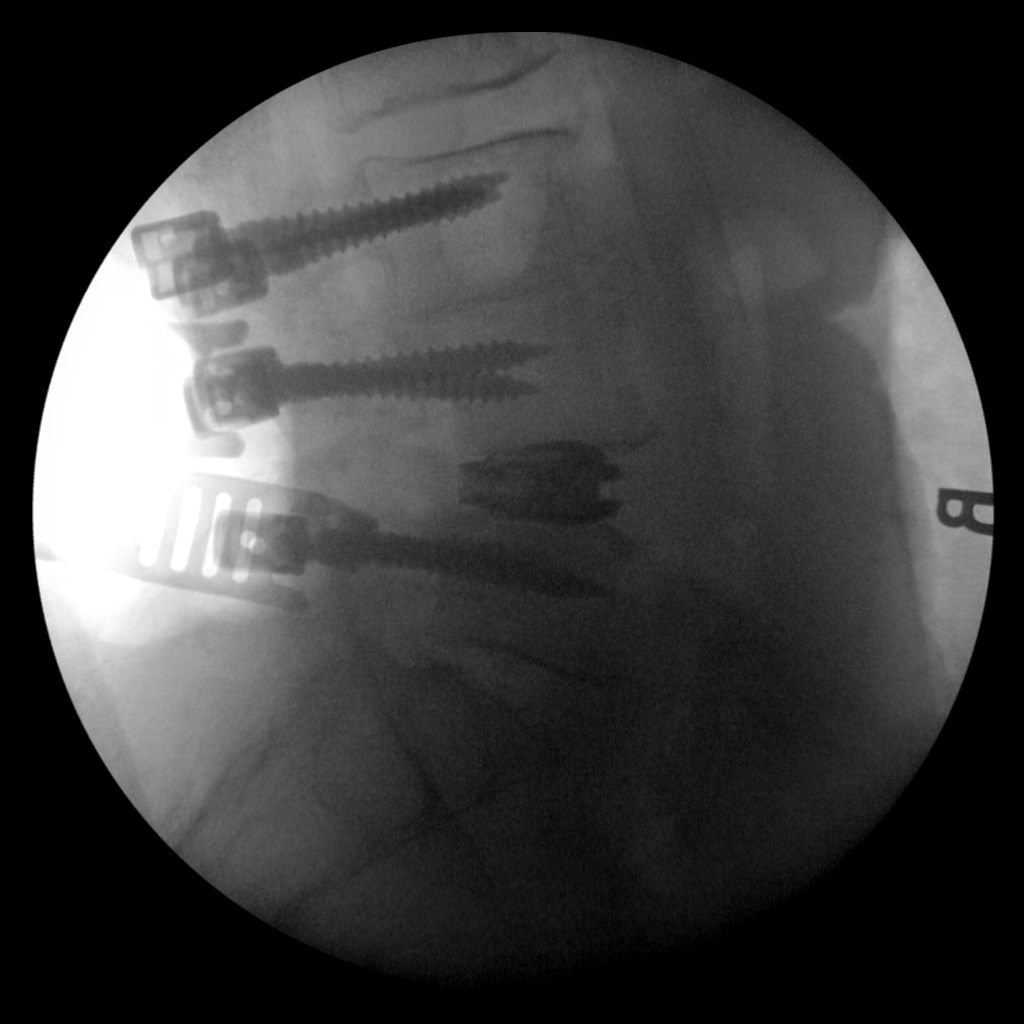
[im 2/2]
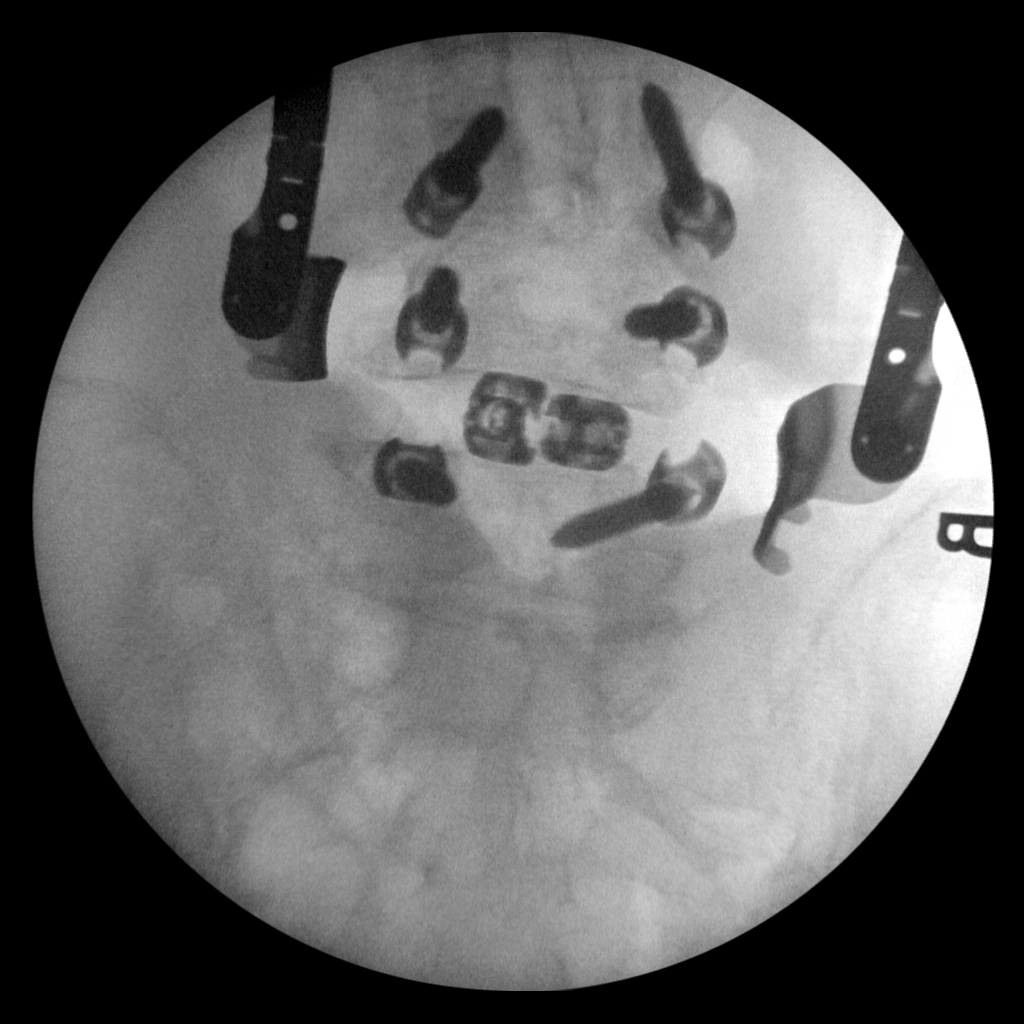

[2 of 2 positions shown; findings below may reference images not displayed]

FINDINGS: Two fluoroscopic spot views of the lumbar spine obtained in the
operating room in frontal and lateral projections. New pedicle
screws are seen at L5. New interbody spacer at L4-L5. Previous L3-L4
hardware. Fluoroscopy time 25 seconds. Dose 18.49 mGy
IMPRESSION: Intraoperative fluoroscopy during lumbar spine surgery.

## 2022-05-22 ENCOUNTER — Ambulatory Visit
Admission: RE | Admit: 2022-05-22 | Discharge: 2022-05-22 | Disposition: A | Discharge status: Home / Self Care | Payer: Medicare Other | Source: Ambulatory Visit | Attending: General Surgery | Admitting: General Surgery

## 2022-05-22 DIAGNOSIS — N6452 Nipple discharge: Secondary | ICD-10-CM | POA: Insufficient documentation

## 2022-05-22 MED ORDER — GADOBUTROL 1 MMOL/ML IV SOLN
8.0000 mL | Freq: Once | INTRAVENOUS | Status: AC | PRN
  Administered 2022-05-22: 8 mL via INTRAVENOUS

## 2022-05-26 ENCOUNTER — Other Ambulatory Visit: Payer: Self-pay | Admitting: General Surgery

## 2022-05-26 DIAGNOSIS — R928 Other abnormal and inconclusive findings on diagnostic imaging of breast: Secondary | ICD-10-CM

## 2022-05-31 ENCOUNTER — Ambulatory Visit
Admission: RE | Admit: 2022-05-31 | Discharge: 2022-05-31 | Disposition: A | Discharge status: Home / Self Care | Payer: Medicare Other | Source: Ambulatory Visit | Attending: General Surgery | Admitting: General Surgery

## 2022-05-31 DIAGNOSIS — R928 Other abnormal and inconclusive findings on diagnostic imaging of breast: Secondary | ICD-10-CM

## 2022-05-31 MED ORDER — GADOPICLENOL 0.5 MMOL/ML IV SOLN
9.0000 mL | Freq: Once | INTRAVENOUS | Status: AC | PRN
  Administered 2022-05-31: 9 mL via INTRAVENOUS

## 2022-06-06 ENCOUNTER — Ambulatory Visit: Payer: Self-pay | Admitting: General Surgery

## 2022-06-06 ENCOUNTER — Other Ambulatory Visit: Payer: Self-pay | Admitting: General Surgery

## 2022-06-06 DIAGNOSIS — N6452 Nipple discharge: Secondary | ICD-10-CM

## 2022-06-06 NOTE — H&P (View-Only) (Signed)
HISTORY OF PRESENT ILLNESS:    Courtney [name] is a 82 y.o.female patient who comes for follow up after breast MRI for left bloody nipple discharge.   Ms. Salvaggio reports she has been having nipple discharge since December.  Initially the discharge was clear.  Since February the discharge has changed to bloody.  She was concerned due to the bloody nipple discharge.  Denies any pain.  Discharge is mainly a spontaneous seen on her closed.  She was allowed with diagnostic mammogram that showed small mass on the periareolar area.  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  She has not had any bleeding since the biopsy.  Radiologist recommended to proceed with bilateral breast MRI with and without contrast for further evaluation of the nipple discharge. Bilateral breast MRI was done on 05/22/2022.  MRI showed a 3 cm area of linear non-mass enhancement in the retroareolar left breast.  MRI guided biopsy was done showing mild fibrocystic changes.  This is discordant with images.  Excisional biopsy was recommended by radiologist.  I personally Maletta the images of the MRI.      PAST MEDICAL HISTORY:  Past Medical History:  Diagnosis Date   Bulging lumbar disc    per patient   Chronic urticaria 07/06/2013   DM (diabetes mellitus) type II controlled, neurological manifestation (CMS/HHS-HCC) 07/06/2013   GERD (gastroesophageal reflux disease) 06/02/2016   HTN (hypertension) 07/06/2013   Hyperlipidemia 07/06/2013   Osteoarthritis    ([facility]) a. Lumbar spine  b. Cervical spine  c. Plantar fasciitis   Psoriasis    Psoriasis    Pulmonary fibrosis (CMS/HHS-HCC)    ([facility]) a. Left pulmonary nodules  b. Methotrexate stopped   RA (rheumatoid arthritis) (CMS/HHS-HCC)    ([facility]) a. Seropositive, positive anti-CCP antibodies.  b. s/p Methotrexate, stopped because of lung disease  c.  Remicade  d. Leflunomide  e.  Skin cancers   Sleep apnea    Urethral stenosis    w/ bladder polyps.  Followed by Dr. Harman         PAST SURGICAL HISTORY:   Past Surgical History:  Procedure Laterality Date   ENDOMETRIAL BIOPSY  1991   AT [facility]   COLONOSCOPY  08/14/2005   NML   EGD  08/14/2005   Hiatus hernia   LAMINECTOMY LUMBAR SPINE  2015   L3-S1   COLONOSCOPY  12/31/2015   Entire examined colon is normal/Negative colon biopsies/No Repeat/MUS   EGD  12/31/2015   Procedure aborted due to coughing and larynegeal spasm/Repeat after pulmonologist evaluation/MUS   EGD  06/02/2016   GERD/Chronic gastritis/Repeat 7 weeks/MUS   EGD  11/07/2016   Chronic gastritis no repeat MUS    Back pain  06/25/2020   ARTHROSCOPIC ROTATOR CUFF REPAIR     ARTHROSCOPIC ROTATOR CUFF REPAIR     cataract surgery (2)     CHOLECYSTECTOMY     ENDOSCOPIC CARPAL TUNNEL RELEASE Bilateral    EYELID SURGERY     L3-4 discectomy, 1/17     SKIN CANCER REMOVAL     UPPER GASTROINTESTINAL ENDOSCOPY           MEDICATIONS:  Outpatient Encounter Medications as of 06/06/2022  Medication Sig Dispense Refill   aspirin 81 MG EC tablet Take 81 mg by mouth once daily.       bisoprolol (ZEBETA) 10 MG tablet TAKE ONE TABLET (10 MG) BY MOUTH EVERY DAY 90 tablet 3   blood glucose diagnostic (MICRO BLOOD GLUCOSE) test strip Use 3 (  three) times daily Relion test strips. Diagnosis: E11.9 270 each 1   famotidine (PEPCID) 40 MG tablet Take 40 mg by mouth at bedtime     fluconazole (DIFLUCAN) 150 MG tablet      FUROsemide (LASIX) 20 MG tablet Take 1 tablet (20 mg total) by mouth once daily (Patient taking differently: Take 20 mg by mouth once daily As needed) 90 tablet 3   gabapentin (NEURONTIN) 100 MG capsule Take 2 capsules (200 mg total) by mouth at bedtime 180 capsule 3   insulin NPH (HUMULIN N PEN) pen injector (concentration 100 units/mL) Inject 25 Units subcutaneously 2 (two) times daily before meals     leflunomide (ARAVA) 10 MG tablet TAKE 1 TABLET BY MOUTH DAILY 90 tablet 1   levocetirizine (XYZAL) 5 MG tablet TAKE ONE TABLET EACH EVENING 90  tablet 1   montelukast (SINGULAIR) 10 mg tablet TAKE ONE TABLET BY MOUTH ONCE DAILY 90 tablet 3   olmesartan-hydroCHLOROthiazide (BENICAR HCT) 40-12.5 mg tablet Take 1 tablet by mouth once daily 30 tablet 11   pantoprazole (PROTONIX) 40 MG DR tablet Take 1 tablet (40 mg total) by mouth 2 (two) times daily before meals 180 tablet 3   rOPINIRole (REQUIP) 0.5 MG tablet TAKE ONE TABLET BY MOUTH AT BEDTIME AS NEEDED (LEGS) 30 tablet 11   simvastatin (ZOCOR) 40 MG tablet Take 1 tablet (40 mg total) by mouth at bedtime 90 tablet 3   sodium chloride 0.9% SolP 500 mL with inFLIXimab 100 mg SolR IVPB Inject into the vein Every 6 weeks     spironolactone (ALDACTONE) 25 MG tablet TAKE ONE TABLET BY MOUTH EVERY DAY 90 tablet 3   No facility-administered encounter medications on file as of 06/06/2022.     ALLERGIES:   Methotrexate, Adhesive, Iodine, Lyrica [pregabalin], Other, Plaquenil [hydroxychloroquine], Procardia [nifedipine], Propranolol hcl, Codeine, Famciclovir, Inderide [propranolol-hydrochlorothiazid], and Metrizamide   SOCIAL HISTORY:  Social History   Socioeconomic History   Marital status: Married  Occupational History   Occupation: Retired  Tobacco Use   Smoking status: Never   Smokeless tobacco: Never  Vaping Use   Vaping status: Never Used  Substance and Sexual Activity   Alcohol use: No   Drug use: No   Sexual activity: Yes    Partners: Male    Birth control/protection: Post-menopausal, None  Social History Narrative   Married. Feels safe in home.    FAMILY HISTORY:  Family History  Problem Relation Name Age of Onset   COPD Mother     Coronary Artery Disease (Blocked arteries around heart) Father Clifton    Dementia Father Clifton        Had dementia about 10 years   Cirrhosis Sister         Liver     GENERAL REVIEW OF SYSTEMS:   General ROS: negative for - chills, fatigue, fever, weight gain or weight loss Allergy and Immunology ROS: negative for - hives   Hematological and Lymphatic ROS: negative for - bleeding problems or bruising, negative for palpable nodes Endocrine ROS: negative for - heat or cold intolerance, hair changes Respiratory ROS: negative for - cough, shortness of breath or wheezing Cardiovascular ROS: no chest pain or palpitations GI ROS: negative for nausea, vomiting, abdominal pain, diarrhea, constipation Musculoskeletal ROS: negative for - joint swelling or muscle pain Neurological ROS: negative for - confusion, syncope Dermatological ROS: negative for pruritus and rash  PHYSICAL EXAM:  Vitals:   06/06/22 1127  BP: (!) 193/86  Pulse: 81  .    Ht:165.1 cm (5' 5") Wt:83.9 kg (185 lb) BSA:Body surface area is 1.96 meters squared. Body mass index is 30.79 kg/m..   GENERAL: Alert, active, oriented x3  HEENT: Pupils equal reactive to light. Extraocular movements are intact. Sclera clear. Palpebral conjunctiva normal red color.Pharynx clear.  NECK: Supple with no palpable mass and no adenopathy.  LUNGS: Sound clear with no rales rhonchi or wheezes.  HEART: Regular rhythm S1 and S2 without murmur.  BREAST: Left breast again examined in the supine position.  No palpable masses, no nipple discharge today.  EXTREMITIES: Well-developed well-nourished symmetrical with no dependent edema.  NEUROLOGICAL: Awake alert oriented, facial expression symmetrical, moving all extremities.      IMPRESSION:     Bloody discharge from left nipple [N64.52] -Initial suspicious mass on diagnostic mammogram and ultrasound at the 4 o'clock position was biopsy showing ruptured duct ectasia.   -Bilateral MRI was recommended for further evaluation of nipple discharge and showed an enhancing mass.  MRI guided biopsy showed fibrocystic changes discordant with images -Excisional biopsy was recommended to rule out malignancy -I discussed with patient the goal of the surgery that he is to rule out malignancy, not therapeutic -Discussed with  patient the risks of surgery including bleeding, infection, seroma, hematoma, pain, scar tissue, among others.  The patient reports she understood and agreed to proceed with radiofrequency tag excisional biopsy of the left breast.         PLAN:  1.  Left breast radiofrequency tag excisional biopsy ([id number]) 2.  Hold aspirin 5 days before the procedure 3.  Contact us if you have any concern  Patient verbalized understanding, all questions were answered, and were agreeable with the plan outlined above.   Lisa Milian Cintron-Diaz, MD  Electronically signed by Lawrence Mitch Cintron-Diaz, MD  

## 2022-06-06 NOTE — H&P (Signed)
HISTORY OF PRESENT ILLNESS:    Courtney [name] is a 82 y.o.female patient who comes for follow up after breast MRI for left bloody nipple discharge.   [name] reports she has been having nipple discharge since December.  Initially the discharge was clear.  Since February the discharge has changed to bloody.  She was concerned due to the bloody nipple discharge.  Denies any pain.  Discharge is mainly a spontaneous seen on her closed.  She was allowed with diagnostic mammogram that showed small mass on the periareolar area.  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  She has not had any bleeding since the biopsy.  Radiologist recommended to proceed with bilateral breast MRI with and without contrast for further evaluation of the nipple discharge. Bilateral breast MRI was done on 05/22/2022.  MRI showed a 3 cm area of linear non-mass enhancement in the retroareolar left breast.  MRI guided biopsy was done showing mild fibrocystic changes.  This is discordant with images.  Excisional biopsy was recommended by radiologist.  I personally Maletta the images of the MRI.      PAST MEDICAL HISTORY:  Past Medical History:  Diagnosis Date   Bulging lumbar disc    per patient   Chronic urticaria 07/06/2013   DM (diabetes mellitus) type II controlled, neurological manifestation (CMS/HHS-HCC) 07/06/2013   GERD (gastroesophageal reflux disease) 06/02/2016   HTN (hypertension) 07/06/2013   Hyperlipidemia 07/06/2013   Osteoarthritis    ([facility]) a. Lumbar spine  b. Cervical spine  c. Plantar fasciitis   Psoriasis    Psoriasis    Pulmonary fibrosis (CMS/HHS-HCC)    ([facility]) a. Left pulmonary nodules  b. Methotrexate stopped   RA (rheumatoid arthritis) (CMS/HHS-HCC)    ([facility]) a. Seropositive, positive anti-CCP antibodies.  b. s/p Methotrexate, stopped because of lung disease  c.  Remicade  d. Leflunomide  e.  Skin cancers   Sleep apnea    Urethral stenosis    w/ bladder polyps.  Followed by Dr. Leonette Monarch         PAST SURGICAL HISTORY:   Past Surgical History:  Procedure Laterality Date   ENDOMETRIAL BIOPSY  1991   AT [facility]   COLONOSCOPY  08/14/2005   NML   EGD  08/14/2005   Hiatus hernia   LAMINECTOMY LUMBAR SPINE  2015   L3-S1   COLONOSCOPY  12/31/2015   Entire examined colon is normal/Negative colon biopsies/No Repeat/MUS   EGD  12/31/2015   Procedure aborted due to coughing and larynegeal spasm/Repeat after pulmonologist evaluation/MUS   EGD  06/02/2016   GERD/Chronic gastritis/Repeat 7 weeks/MUS   EGD  11/07/2016   Chronic gastritis no repeat MUS    Back pain  06/25/2020   ARTHROSCOPIC ROTATOR CUFF REPAIR     ARTHROSCOPIC ROTATOR CUFF REPAIR     cataract surgery (2)     CHOLECYSTECTOMY     ENDOSCOPIC CARPAL TUNNEL RELEASE Bilateral    EYELID SURGERY     L3-4 discectomy, 1/17     SKIN CANCER REMOVAL     UPPER GASTROINTESTINAL ENDOSCOPY           MEDICATIONS:  Outpatient Encounter Medications as of 06/06/2022  Medication Sig Dispense Refill   aspirin 81 MG EC tablet Take 81 mg by mouth once daily.       bisoprolol (ZEBETA) 10 MG tablet TAKE ONE TABLET (10 MG) BY MOUTH EVERY DAY 90 tablet 3   blood glucose diagnostic (MICRO BLOOD GLUCOSE) test strip Use 3 (  three) times daily Relion test strips. Diagnosis: E11.9 270 each 1   famotidine (PEPCID) 40 MG tablet Take 40 mg by mouth at bedtime     fluconazole (DIFLUCAN) 150 MG tablet      FUROsemide (LASIX) 20 MG tablet Take 1 tablet (20 mg total) by mouth once daily (Patient taking differently: Take 20 mg by mouth once daily As needed) 90 tablet 3   gabapentin (NEURONTIN) 100 MG capsule Take 2 capsules (200 mg total) by mouth at bedtime 180 capsule 3   insulin NPH (HUMULIN N PEN) pen injector (concentration 100 units/mL) Inject 25 Units subcutaneously 2 (two) times daily before meals     leflunomide (ARAVA) 10 MG tablet TAKE 1 TABLET BY MOUTH DAILY 90 tablet 1   levocetirizine (XYZAL) 5 MG tablet TAKE ONE TABLET EACH EVENING 90  tablet 1   montelukast (SINGULAIR) 10 mg tablet TAKE ONE TABLET BY MOUTH ONCE DAILY 90 tablet 3   olmesartan-hydroCHLOROthiazide (BENICAR HCT) 40-12.5 mg tablet Take 1 tablet by mouth once daily 30 tablet 11   pantoprazole (PROTONIX) 40 MG DR tablet Take 1 tablet (40 mg total) by mouth 2 (two) times daily before meals 180 tablet 3   rOPINIRole (REQUIP) 0.5 MG tablet TAKE ONE TABLET BY MOUTH AT BEDTIME AS NEEDED (LEGS) 30 tablet 11   simvastatin (ZOCOR) 40 MG tablet Take 1 tablet (40 mg total) by mouth at bedtime 90 tablet 3   sodium chloride 0.9% SolP 500 mL with inFLIXimab 100 mg SolR IVPB Inject into the vein Every 6 weeks     spironolactone (ALDACTONE) 25 MG tablet TAKE ONE TABLET BY MOUTH EVERY DAY 90 tablet 3   No facility-administered encounter medications on file as of 06/06/2022.     ALLERGIES:   Methotrexate, Adhesive, Iodine, Lyrica [pregabalin], Other, Plaquenil [hydroxychloroquine], Procardia [nifedipine], Propranolol hcl, Codeine, Famciclovir, Inderide [propranolol-hydrochlorothiazid], and Metrizamide   SOCIAL HISTORY:  Social History   Socioeconomic History   Marital status: Married  Occupational History   Occupation: Retired  Tobacco Use   Smoking status: Never   Smokeless tobacco: Never  Vaping Use   Vaping status: Never Used  Substance and Sexual Activity   Alcohol use: No   Drug use: No   Sexual activity: Yes    Partners: Male    Birth control/protection: Post-menopausal, None  Social History Narrative   Married. Feels safe in home.    FAMILY HISTORY:  Family History  Problem Relation Name Age of Onset   COPD Mother     Coronary Artery Disease (Blocked arteries around heart) Father Clifton    Dementia Father Ephriam Knuckles        Had dementia about 10 years   Cirrhosis Sister         Liver     GENERAL REVIEW OF SYSTEMS:   General ROS: negative for - chills, fatigue, fever, weight gain or weight loss Allergy and Immunology ROS: negative for - hives   Hematological and Lymphatic ROS: negative for - bleeding problems or bruising, negative for palpable nodes Endocrine ROS: negative for - heat or cold intolerance, hair changes Respiratory ROS: negative for - cough, shortness of breath or wheezing Cardiovascular ROS: no chest pain or palpitations GI ROS: negative for nausea, vomiting, abdominal pain, diarrhea, constipation Musculoskeletal ROS: negative for - joint swelling or muscle pain Neurological ROS: negative for - confusion, syncope Dermatological ROS: negative for pruritus and rash  PHYSICAL EXAM:  Vitals:   06/06/22 1127  BP: (!) 193/86  Pulse: 81  .  Ht:165.1 cm (5\' 5" ) Wt:83.9 kg (185 lb) ZOX:WRUE surface area is 1.96 meters squared. Body mass index is 30.79 kg/m.Marland Kitchen   GENERAL: Alert, active, oriented x3  HEENT: Pupils equal reactive to light. Extraocular movements are intact. Sclera clear. Palpebral conjunctiva normal red color.Pharynx clear.  NECK: Supple with no palpable mass and no adenopathy.  LUNGS: Sound clear with no rales rhonchi or wheezes.  HEART: Regular rhythm S1 and S2 without murmur.  BREAST: Left breast again examined in the supine position.  No palpable masses, no nipple discharge today.  EXTREMITIES: Well-developed well-nourished symmetrical with no dependent edema.  NEUROLOGICAL: Awake alert oriented, facial expression symmetrical, moving all extremities.      IMPRESSION:     Bloody discharge from left nipple [N64.52] -Initial suspicious mass on diagnostic mammogram and ultrasound at the 4 o'clock position was biopsy showing ruptured duct ectasia.   -Bilateral MRI was recommended for further evaluation of nipple discharge and showed an enhancing mass.  MRI guided biopsy showed fibrocystic changes discordant with images -Excisional biopsy was recommended to rule out malignancy -I discussed with patient the goal of the surgery that he is to rule out malignancy, not therapeutic -Discussed with  patient the risks of surgery including bleeding, infection, seroma, hematoma, pain, scar tissue, among others.  The patient reports she understood and agreed to proceed with radiofrequency tag excisional biopsy of the left breast.         PLAN:  1.  Left breast radiofrequency tag excisional biopsy (19125) 2.  Hold aspirin 5 days before the procedure 3.  Contact us if you have any concern  Patient verbalized understanding, all questions were answered, and were agreeable with the plan outlined above.   Carolan Shiver, MD  Electronically signed by Carolan Shiver, MD

## 2022-06-07 ENCOUNTER — Ambulatory Visit
Admission: RE | Admit: 2022-06-07 | Discharge: 2022-06-07 | Disposition: A | Discharge status: Home / Self Care | Payer: Medicare Other | Source: Ambulatory Visit | Attending: General Surgery | Admitting: General Surgery

## 2022-06-07 DIAGNOSIS — N6452 Nipple discharge: Secondary | ICD-10-CM | POA: Diagnosis present

## 2022-06-07 MED ORDER — LIDOCAINE-EPINEPHRINE (PF) 1 %-1:200000 IJ SOLN
10.0000 mL | Freq: Once | INTRAMUSCULAR | Status: AC
  Administered 2022-06-07: 10 mL
  Filled 2022-06-07: qty 10

## 2022-06-07 MED ORDER — LIDOCAINE HCL (PF) 1 % IJ SOLN
10.0000 mL | Freq: Once | INTRAMUSCULAR | Status: AC
  Administered 2022-06-07: 10 mL
  Filled 2022-06-07: qty 10

## 2022-06-07 MED ORDER — LIDOCAINE HCL 1 % IJ SOLN
10.0000 mL | Freq: Once | INTRAMUSCULAR | Status: DC
  Filled 2022-06-07: qty 10

## 2022-06-12 ENCOUNTER — Other Ambulatory Visit: Payer: Self-pay

## 2022-06-12 ENCOUNTER — Encounter
Admission: RE | Admit: 2022-06-12 | Discharge: 2022-06-12 | Disposition: A | Discharge status: Home / Self Care | Payer: Medicare Other | Source: Ambulatory Visit | Attending: General Surgery | Admitting: General Surgery

## 2022-06-12 DIAGNOSIS — I10 Essential (primary) hypertension: Secondary | ICD-10-CM

## 2022-06-12 DIAGNOSIS — E1140 Type 2 diabetes mellitus with diabetic neuropathy, unspecified: Secondary | ICD-10-CM

## 2022-06-12 HISTORY — DX: Cardiac murmur, unspecified: R01.1

## 2022-06-12 NOTE — Patient Instructions (Addendum)
Your procedure is scheduled on: 06/19/22 - Monday Report to the Registration Desk on the 1st floor of the [facility]. To find out your arrival time, please call [phone] between 1PM - 3PM on: 06/16/22 - Friday If your arrival time is 6:00 am, do not arrive before that time as the [facility] entrance doors do not open until 6:00 am.  REMEMBER: Instructions that are not followed completely may result in serious medical risk, up to and including death; or upon the discretion of your surgeon and anesthesiologist your surgery may need to be rescheduled.  Do not eat food or drink any liquids  after midnight the night before surgery.  No gum chewing or hard candies.  One week prior to surgery: Stop Anti-inflammatories (NSAIDS) such as Advil, Aleve, Ibuprofen, Motrin, Naproxen, Naprosyn and Aspirin based products such as Excedrin, Goody's Powder, BC Powder.  Stop ANY OVER THE COUNTER supplements until after surgery.  You may however, continue to take Tylenol if needed for pain up until the day of surgery.   TAKE ONLY THESE MEDICATIONS THE MORNING OF SURGERY WITH A SIP OF WATER:  pantoprazole (PROTONIX) - (take one the night before and one on the morning of surgery - helps to prevent nausea after surgery.) bisoprolol (ZEBETA)    - insulin NPH Human (HUMULIN N,NOVOLIN N) inject 1/2 of your prescribed dose of insulin on the night before your surgery and hold insulin on the morning of surgery.  - aspirin EC hold 5 days before your surgery beginning 06/14/22.  No Alcohol for 24 hours before or after surgery.  No Smoking including e-cigarettes for 24 hours before surgery.  No chewable tobacco products for at least 6 hours before surgery.  No nicotine patches on the day of surgery.  Do not use any "recreational" drugs for at least a week (preferably 2 weeks) before your surgery.  Please be advised that the combination of cocaine and anesthesia may have negative outcomes, up to and  including death. If you test positive for cocaine, your surgery will be cancelled.  On the morning of surgery brush your teeth with toothpaste and water, you may rinse your mouth with mouthwash if you wish. Do not swallow any toothpaste or mouthwash.  Use CHG Soap or wipes as directed on instruction sheet.  Do not wear jewelry, make-up, hairpins, clips or nail polish.  Do not wear lotions, powders, or perfumes.   Do not shave body hair from the neck down 48 hours before surgery.  Contact lenses, hearing aids and dentures may not be worn into surgery.  Do not bring valuables to the hospital. [facility] is not responsible for any missing/lost belongings or valuables.   Notify your doctor if there is any change in your medical condition (cold, fever, infection).  Wear comfortable clothing (specific to your surgery type) to the hospital.  After surgery, you can help prevent lung complications by doing breathing exercises.  Take deep breaths and cough every 1-2 hours. Your doctor may order a device called an Incentive Spirometer to help you take deep breaths. When coughing or sneezing, hold a pillow firmly against your incision with both hands. This is called "splinting." Doing this helps protect your incision. It also decreases belly discomfort.  If you are being admitted to the hospital overnight, leave your suitcase in the car. After surgery it may be brought to your room.  In case of increased patient census, it may be necessary for you, the patient, to continue your postoperative  care in the Same Day Surgery department.  If you are being discharged the day of surgery, you will not be allowed to drive home. You will need a responsible individual to drive you home and stay with you for 24 hours after surgery.   If you are taking public transportation, you will need to have a responsible individual with you.  Please call the Pre-admissions Testing Dept. at [phone] if you have  any questions about these instructions.  Surgery Visitation Policy:  Patients having surgery or a procedure may have two visitors.  Children under the age of 1 must have an adult with them who is not the patient.  Inpatient Visitation:    Visiting hours are 7 a.m. to 8 p.m. Up to four visitors are allowed at one time in a patient room. The visitors may rotate out with other people during the day.  One visitor age 65 or older may stay with the patient overnight and must be in the room by 8 p.m.    Preparing for Surgery with CHLORHEXIDINE GLUCONATE (CHG) Soap  Chlorhexidine Gluconate (CHG) Soap  o An antiseptic cleaner that kills germs and bonds with the skin to continue killing germs even after washing  o Used for showering the night before surgery and morning of surgery  Before surgery, you can play an important role by reducing the number of germs on your skin.  CHG (Chlorhexidine gluconate) soap is an antiseptic cleanser which kills germs and bonds with the skin to continue killing germs even after washing.  Please do not use if you have an allergy to CHG or antibacterial soaps. If your skin becomes reddened/irritated stop using the CHG.  1. Shower the NIGHT BEFORE SURGERY and the MORNING OF SURGERY with CHG soap.  2. If you choose to wash your hair, wash your hair first as usual with your normal shampoo.  3. After shampooing, rinse your hair and body thoroughly to remove the shampoo.  4. Use CHG as you would any other liquid soap. You can apply CHG directly to the skin and wash gently with a scrungie or a clean washcloth.  5. Apply the CHG soap to your body only from the neck down. Do not use on open wounds or open sores. Avoid contact with your eyes, ears, mouth, and genitals (private parts). Wash face and genitals (private parts) with your normal soap.  6. Wash thoroughly, paying special attention to the area where your surgery will be performed.  7. Thoroughly rinse  your body with warm water.  8. Do not shower/wash with your normal soap after using and rinsing off the CHG soap.  9. Pat yourself dry with a clean towel.  10. Wear clean pajamas to bed the night before surgery.  12. Place clean sheets on your bed the night of your first shower and do not sleep with pets.  13. Shower again with the CHG soap on the day of surgery prior to arriving at the hospital.  14. Do not apply any deodorants/lotions/powders.  15. Please wear clean clothes to the hospital.

## 2022-06-13 ENCOUNTER — Encounter
Admission: RE | Admit: 2022-06-13 | Discharge: 2022-06-13 | Disposition: A | Discharge status: Home / Self Care | Payer: Medicare Other | Source: Ambulatory Visit | Attending: General Surgery | Admitting: General Surgery

## 2022-06-13 DIAGNOSIS — R9431 Abnormal electrocardiogram [ECG] [EKG]: Secondary | ICD-10-CM | POA: Diagnosis not present

## 2022-06-13 DIAGNOSIS — I10 Essential (primary) hypertension: Secondary | ICD-10-CM | POA: Diagnosis present

## 2022-06-18 MED ORDER — CEFAZOLIN SODIUM-DEXTROSE 2-4 GM/100ML-% IV SOLN
2.0000 g | INTRAVENOUS | Status: AC
  Administered 2022-06-19: 2 g via INTRAVENOUS

## 2022-06-18 MED ORDER — ORAL CARE MOUTH RINSE: 15.0000 mL | Freq: Once | OROMUCOSAL | Status: DC

## 2022-06-18 MED ORDER — CHLORHEXIDINE GLUCONATE 0.12 % MT SOLN: 15.0000 mL | Freq: Once | OROMUCOSAL | Status: DC

## 2022-06-18 MED ORDER — SODIUM CHLORIDE 0.9 % IV SOLN: INTRAVENOUS | Status: DC

## 2022-06-19 ENCOUNTER — Encounter
Admission: RE | Disposition: A | Discharge status: Home / Self Care | Payer: Self-pay | Source: Ambulatory Visit | Attending: General Surgery

## 2022-06-19 ENCOUNTER — Ambulatory Visit
Admission: RE | Admit: 2022-06-19 | Discharge: 2022-06-19 | Disposition: A | Discharge status: Home / Self Care | Payer: Medicare Other | Source: Ambulatory Visit | Attending: General Surgery | Admitting: General Surgery

## 2022-06-19 ENCOUNTER — Encounter: Payer: Self-pay | Admitting: General Surgery

## 2022-06-19 ENCOUNTER — Other Ambulatory Visit: Payer: Self-pay

## 2022-06-19 ENCOUNTER — Ambulatory Visit: Payer: Medicare Other | Admitting: Urgent Care

## 2022-06-19 ENCOUNTER — Ambulatory Visit: Payer: Medicare Other | Admitting: Anesthesiology

## 2022-06-19 DIAGNOSIS — E119 Type 2 diabetes mellitus without complications: Secondary | ICD-10-CM | POA: Insufficient documentation

## 2022-06-19 DIAGNOSIS — Z794 Long term (current) use of insulin: Secondary | ICD-10-CM | POA: Diagnosis not present

## 2022-06-19 DIAGNOSIS — E1140 Type 2 diabetes mellitus with diabetic neuropathy, unspecified: Secondary | ICD-10-CM

## 2022-06-19 DIAGNOSIS — N6452 Nipple discharge: Secondary | ICD-10-CM

## 2022-06-19 DIAGNOSIS — N62 Hypertrophy of breast: Secondary | ICD-10-CM | POA: Insufficient documentation

## 2022-06-19 DIAGNOSIS — I10 Essential (primary) hypertension: Secondary | ICD-10-CM | POA: Insufficient documentation

## 2022-06-19 DIAGNOSIS — Z8249 Family history of ischemic heart disease and other diseases of the circulatory system: Secondary | ICD-10-CM | POA: Insufficient documentation

## 2022-06-19 HISTORY — PX: BREAST BIOPSY WITH RADIO FREQUENCY LOCALIZER: SHX6895

## 2022-06-19 LAB — GLUCOSE, CAPILLARY
Glucose-Capillary: 97 mg/dL (ref 70–99)
Glucose-Capillary: 112 mg/dL — ABNORMAL HIGH (ref 70–99)

## 2022-06-19 SURGERY — BREAST BIOPSY WITH RADIO FREQUENCY LOCALIZER
Anesthesia: General | Site: Breast | Laterality: Left

## 2022-06-19 MED ORDER — HYDROCODONE-ACETAMINOPHEN 5-325 MG PO TABS: 1.0000 | ORAL_TABLET | ORAL | 0 refills | Status: AC | PRN

## 2022-06-19 MED ORDER — OXYCODONE HCL 5 MG/5ML PO SOLN: 5.0000 mg | Freq: Once | ORAL | Status: DC | PRN

## 2022-06-19 MED ORDER — CHLORHEXIDINE GLUCONATE 0.12 % MT SOLN
OROMUCOSAL | Status: AC
  Filled 2022-06-19: qty 15

## 2022-06-19 MED ORDER — FENTANYL CITRATE (PF) 100 MCG/2ML IJ SOLN
INTRAMUSCULAR | Status: AC
  Filled 2022-06-19: qty 2

## 2022-06-19 MED ORDER — EPINEPHRINE PF 1 MG/ML IJ SOLN
INTRAMUSCULAR | Status: AC
  Filled 2022-06-19: qty 1

## 2022-06-19 MED ORDER — PROPOFOL 10 MG/ML IV BOLUS
INTRAVENOUS | Status: DC | PRN
  Administered 2022-06-19: 50 mg via INTRAVENOUS
  Administered 2022-06-19: 125 ug/kg/min via INTRAVENOUS

## 2022-06-19 MED ORDER — FENTANYL CITRATE (PF) 100 MCG/2ML IJ SOLN
INTRAMUSCULAR | Status: DC | PRN
  Administered 2022-06-19 (×4): 25 ug via INTRAVENOUS

## 2022-06-19 MED ORDER — PROPOFOL 1000 MG/100ML IV EMUL
INTRAVENOUS | Status: AC
  Filled 2022-06-19: qty 100

## 2022-06-19 MED ORDER — OXYCODONE HCL 5 MG PO TABS: 5.0000 mg | ORAL_TABLET | Freq: Once | ORAL | Status: DC | PRN

## 2022-06-19 MED ORDER — DEXAMETHASONE SODIUM PHOSPHATE 10 MG/ML IJ SOLN
INTRAMUSCULAR | Status: DC | PRN
  Administered 2022-06-19: 5 mg via INTRAVENOUS

## 2022-06-19 MED ORDER — ONDANSETRON HCL 4 MG/2ML IJ SOLN
INTRAMUSCULAR | Status: DC | PRN
  Administered 2022-06-19: 4 mg via INTRAVENOUS

## 2022-06-19 MED ORDER — ONDANSETRON HCL 4 MG/2ML IJ SOLN: 4.0000 mg | Freq: Once | INTRAMUSCULAR | Status: DC | PRN

## 2022-06-19 MED ORDER — ACETAMINOPHEN 10 MG/ML IV SOLN: 1000.0000 mg | Freq: Once | INTRAVENOUS | Status: DC | PRN

## 2022-06-19 MED ORDER — PROPOFOL 10 MG/ML IV BOLUS
INTRAVENOUS | Status: AC
  Filled 2022-06-19: qty 20

## 2022-06-19 MED ORDER — FENTANYL CITRATE (PF) 100 MCG/2ML IJ SOLN: 25.0000 ug | INTRAMUSCULAR | Status: DC | PRN

## 2022-06-19 MED ORDER — BUPIVACAINE-EPINEPHRINE (PF) 0.25% -1:200000 IJ SOLN
INTRAMUSCULAR | Status: DC | PRN
  Administered 2022-06-19: 30 mL via INTRAMUSCULAR

## 2022-06-19 MED ORDER — CEFAZOLIN SODIUM-DEXTROSE 2-4 GM/100ML-% IV SOLN
INTRAVENOUS | Status: AC
  Filled 2022-06-19: qty 100

## 2022-06-19 MED ORDER — DEXMEDETOMIDINE HCL IN NACL 80 MCG/20ML IV SOLN
INTRAVENOUS | Status: DC | PRN
  Administered 2022-06-19: 4 ug via INTRAVENOUS

## 2022-06-19 MED ORDER — BUPIVACAINE HCL (PF) 0.5 % IJ SOLN
INTRAMUSCULAR | Status: AC
  Filled 2022-06-19: qty 30

## 2022-06-19 SURGICAL SUPPLY — 44 items
ADH SKN CLS APL DERMABOND .7 (GAUZE/BANDAGES/DRESSINGS) ×1
APL PRP STRL LF DISP 70% ISPRP (MISCELLANEOUS) ×1
BLADE SURG 15 STRL LF DISP TIS (BLADE) ×1 IMPLANT
BLADE SURG 15 STRL SS (BLADE) ×1
CHLORAPREP W/TINT 26 (MISCELLANEOUS) ×1 IMPLANT
CNTNR URN SCR LID CUP LEK RST (MISCELLANEOUS) ×1 IMPLANT
CONT SPEC 4OZ STRL OR WHT (MISCELLANEOUS)
DERMABOND ADVANCED .7 DNX12 (GAUZE/BANDAGES/DRESSINGS) ×1 IMPLANT
DEVICE DUBIN SPECIMEN MAMMOGRA (MISCELLANEOUS) ×1 IMPLANT
DRAPE LAPAROTOMY TRNSV 106X77 (MISCELLANEOUS) ×1 IMPLANT
ELECT CAUTERY BLADE TIP 2.5 (TIP) ×1
ELECT REM PT RETURN 9FT ADLT (ELECTROSURGICAL) ×1
ELECTRODE CAUTERY BLDE TIP 2.5 (TIP) ×1 IMPLANT
ELECTRODE REM PT RTRN 9FT ADLT (ELECTROSURGICAL) ×1 IMPLANT
GAUZE 4X4 16PLY ~~LOC~~+RFID DBL (SPONGE) ×1 IMPLANT
GLOVE BIO SURGEON STRL SZ 6.5 (GLOVE) ×1 IMPLANT
GLOVE BIOGEL PI IND STRL 6.5 (GLOVE) ×1 IMPLANT
GOWN STRL REUS W/ TWL LRG LVL3 (GOWN DISPOSABLE) ×3 IMPLANT
GOWN STRL REUS W/TWL LRG LVL3 (GOWN DISPOSABLE) ×2
KIT MARKER MARGIN INK (KITS) IMPLANT
KIT TURNOVER KIT A (KITS) ×1 IMPLANT
LABEL OR SOLS (LABEL) ×1 IMPLANT
MANIFOLD NEPTUNE II (INSTRUMENTS) ×1 IMPLANT
MARKER MARGIN CORRECT CLIP (MARKER) IMPLANT
NDL HYPO 25X1 1.5 SAFETY (NEEDLE) ×1 IMPLANT
NEEDLE HYPO 25X1 1.5 SAFETY (NEEDLE) ×1 IMPLANT
PACK BASIN MINOR ARMC (MISCELLANEOUS) ×1 IMPLANT
RETRACTOR RING XSMALL (MISCELLANEOUS) IMPLANT
RTRCTR WOUND ALEXIS 13CM XS SH (MISCELLANEOUS) ×1
SET LOCALIZER 20 PROBE US (MISCELLANEOUS) ×1 IMPLANT
SUT MNCRL 4-0 (SUTURE) ×1
SUT MNCRL 4-0 27XMFL (SUTURE) ×1
SUT SILK 2 0 SH (SUTURE) ×1 IMPLANT
SUT VIC AB 2-0 SH 27 (SUTURE) ×1
SUT VIC AB 2-0 SH 27XBRD (SUTURE) IMPLANT
SUT VIC AB 3-0 SH 27 (SUTURE) ×1
SUT VIC AB 3-0 SH 27X BRD (SUTURE) ×1 IMPLANT
SUTURE MNCRL 4-0 27XMF (SUTURE) ×1 IMPLANT
SYR 10ML LL (SYRINGE) ×1 IMPLANT
SYR BULB IRRIG 60ML STRL (SYRINGE) ×1 IMPLANT
TRAP FLUID SMOKE EVACUATOR (MISCELLANEOUS) ×1 IMPLANT
TRAP NEPTUNE SPECIMEN COLLECT (MISCELLANEOUS) ×1 IMPLANT
WATER STERILE IRR 1000ML POUR (IV SOLUTION) ×1 IMPLANT
WATER STERILE IRR 500ML POUR (IV SOLUTION) ×1 IMPLANT

## 2022-06-19 NOTE — Transfer of Care (Signed)
Immediate Anesthesia Transfer of Care Note  Patient: Courtney [name]  Procedure(s) Performed: BREAST BIOPSY WITH RADIO FREQUENCY LOCALIZER (Left: Breast)  Patient Location: PACU  Anesthesia Type:General  Level of Consciousness: drowsy  Airway & Oxygen Therapy: Patient Spontanous Breathing and Patient connected to face mask oxygen  Post-op Assessment: Report given to RN and Post -op Vital signs reviewed and stable  Post vital signs: Reviewed and stable  Last Vitals:  Vitals Value Taken Time  BP 113/47 06/19/22 1046  Temp    Pulse 62 06/19/22 1050  Resp 11 06/19/22 1050  SpO2 99 % 06/19/22 1050  Vitals shown include unvalidated device data.  Last Pain:  Vitals:   06/19/22 0817  TempSrc: Temporal  PainSc: 0-No pain         Complications: No notable events documented.

## 2022-06-19 NOTE — Discharge Instructions (Addendum)
  Diet: Resume home heart healthy regular diet.   Activity: No heavy lifting >20 pounds (children, pets, laundry, garbage) or strenuous activity until follow-up, but light activity and walking are encouraged. Do not drive or drink alcohol if taking narcotic pain medications.  Wound care: May shower with soapy water and pat dry (do not rub incisions), but no baths or submerging incision underwater until follow-up. (no swimming)   Medications: Resume all home medications. For mild to moderate pain: acetaminophen (Tylenol) ***or ibuprofen (if no kidney disease). Combining Tylenol with alcohol can substantially increase your risk of causing liver disease. Narcotic pain medications, if prescribed, can be used for severe pain, though may cause nausea, constipation, and drowsiness. Do not combine Tylenol and Norco within a 6 hour period as Norco contains Tylenol. If you do not need the narcotic pain medication, you do not need to fill the prescription.  Call office ([phone]) at any time if any questions, worsening pain, fevers/chills, bleeding, drainage from incision site, or other concerns.   AMBULATORY SURGERY  DISCHARGE INSTRUCTIONS   The drugs that you were given will stay in your system until tomorrow so for the next 24 hours you should not:  Drive an automobile Make any legal decisions Drink any alcoholic beverage   You may resume regular meals tomorrow.  Today it is better to start with liquids and gradually work up to solid foods.  You may eat anything you prefer, but it is better to start with liquids, then soup and crackers, and gradually work up to solid foods.   Please notify your doctor immediately if you have any unusual bleeding, trouble breathing, redness and pain at the surgery site, drainage, fever, or pain not relieved by medication.    Additional Instructions:        Please contact your physician with any problems or Same Day Surgery at [phone], Monday  through Friday 6 am to 4 pm, or [facility] number at [phone].  

## 2022-06-19 NOTE — Anesthesia Procedure Notes (Signed)
Procedure Name: LMA Insertion Date/Time: 06/19/2022 9:35 AM  Performed by: Manning Charity, CRNAPre-anesthesia Checklist: Patient identified, Emergency Drugs available and Patient being monitored Patient Re-evaluated:Patient Re-evaluated prior to induction Oxygen Delivery Method: Circle system utilized Preoxygenation: Pre-oxygenation with 100% oxygen Induction Type: IV induction Ventilation: Mask ventilation without difficulty LMA Size: 3.0 Number of attempts: 2 Tube secured with: Tape

## 2022-06-19 NOTE — Anesthesia Postprocedure Evaluation (Signed)
Anesthesia Post Note  Patient: Courtney [name]  Procedure(s) Performed: BREAST BIOPSY WITH RADIO FREQUENCY LOCALIZER (Left: Breast)  Patient location during evaluation: PACU Anesthesia Type: General Level of consciousness: awake and alert Pain management: pain level controlled Vital Signs Assessment: post-procedure vital signs reviewed and stable Respiratory status: spontaneous breathing, nonlabored ventilation, respiratory function stable and patient connected to nasal cannula oxygen Cardiovascular status: blood pressure returned to baseline and stable Postop Assessment: no apparent nausea or vomiting Anesthetic complications: no   No notable events documented.   Last Vitals:  Vitals:   06/19/22 1115 06/19/22 1131  BP: 123/78 (!) 149/50  Pulse: 64 61  Resp: 15 16  Temp:  (!) 36.2 C  SpO2: 97% 100%    Last Pain:  Vitals:   06/19/22 1131  TempSrc: Temporal  PainSc: 0-No pain                 Corinda Gubler

## 2022-06-19 NOTE — Op Note (Signed)
Preoperative diagnosis: Left breast nipple discharge.  Postoperative diagnosis: Same.   Procedure: Left radiofrequency tag-localized excisional biopsy.                      Anesthesia: GETA  Surgeon: Dr. Hazle Quant  Wound Classification: Clean  Indications: Patient is a 82 y.o. female with a nonpalpable left breast enhancement on MRI due to bloody nipple discharge. Biopsy showed fibrotic tissue discordant with images. Excisional biopsy indicated to rule out malignancy.   Findings: 1. Specimen mammography shows marker and tag on specimen 2. No other palpable mass or lymph node identified.   Description of procedure: Preoperative radiofrequency tag localization was performed by radiology. The patient was taken to the operating room and placed supine on the operating table, and after general anesthesia the left chest was prepped and draped in the usual sterile fashion. A time-out was completed verifying correct patient, procedure, site, positioning, and implant(s) and/or special equipment prior to beginning this procedure.  By comparing the localization studies and interrogation with Localizer device, the probable trajectory and location of the mass was visualized. A circumareolar skin incision was planned in such a way as to minimize the amount of dissection to reach the mass.  The skin incision was made. Flaps were raised and the location of the tag was confirmed with Localizer device confirmed. A 2-0 silk figure-of-eight stay suture was placed and used for retraction. Dissection was then taken down circumferentially, taking care to include the entire localizing tag and a wide margin of grossly normal tissue. The specimen and entire localizing tag were removed. The specimen was oriented and sent to radiology with the localization studies. Confirmation was received that the entire target lesion had been resected. The wound was irrigated. Hemostasis was checked. The breast wound was then  approached for closure.  Eliminate dead space a tissue transfer technique was utilized.  The breast and pectoralis fascia was elevated off the underlying muscle and the serratus muscle circumferentially for a distance of about 30 sq centimeters.  The fascial layer was then approximated with interrupted 2-0 Vicryl sutures.  The superficial layer of the breast parenchyma was then approximated in a similar fashion.  This was done in a radial direction.  The skin flaps were then elevated circumferentially to remove a ripple noted superiorly and medially. The skin was closed with 4-0 Monocryl. Dermabond was applied.  Specimen: Left excisional biopsy                      Complications: None  Estimated Blood Loss: 5 mL

## 2022-06-19 NOTE — Interval H&P Note (Signed)
History and Physical Interval Note:  06/19/2022 9:14 AM  Courtney [name]  has presented today for surgery, with the diagnosis of N64.52 bloody discharge from lt nipple.  The various methods of treatment have been discussed with the patient and family. After consideration of risks, benefits and other options for treatment, the patient has consented to  Procedure(s): BREAST BIOPSY WITH RADIO FREQUENCY LOCALIZER (Left) as a surgical intervention.  The patient's history has been reviewed, patient examined, no change in status, stable for surgery.  I have reviewed the patient's chart and labs.  Questions were answered to the patient's satisfaction.     Carolan Shiver

## 2022-06-19 NOTE — Anesthesia Preprocedure Evaluation (Signed)
Anesthesia Evaluation  Patient identified by MRN, date of birth, ID band Patient awake    Reviewed: Allergy & Precautions, NPO status , Patient's Chart, lab work & pertinent test results  History of Anesthesia Complications (+) PONV and history of anesthetic complications  Airway Mallampati: II  TM Distance: >3 FB Neck ROM: Full    Dental  (+) Upper Dentures, Lower Dentures   Pulmonary sleep apnea , neg pneumonia , neg COPD, Patient abstained from smoking.Not current smoker   Pulmonary exam normal breath sounds clear to auscultation       Cardiovascular Exercise Tolerance: Good METShypertension, Pt. on medications + Peripheral Vascular Disease  (-) CAD and (-) Past MI (-) dysrhythmias + Valvular Problems/Murmurs AS  Rhythm:Regular Rate:Normal + Systolic murmurs Patient without symptoms from AS (denies syncope, dyspnea)  TTE 2023: NORMAL LEFT VENTRICULAR SYSTOLIC FUNCTION  NORMAL RIGHT VENTRICULAR SYSTOLIC FUNCTION  MILD VALVULAR REGURGITATION (See above)  MODERATE Aortic stenosis Calcific AS unchanged from 2022 study     Neuro/Psych  Headaches  Neuromuscular disease  negative psych ROS   GI/Hepatic Neg liver ROS, hiatal hernia,GERD  Medicated and Controlled,,  Endo/Other  diabetes, Insulin Dependent    Renal/GU Renal diseasenegative Renal ROS     Musculoskeletal  (+) Arthritis ,    Abdominal   Peds  Hematology   Anesthesia Other Findings Past Medical History: No date: Arthritis     Comment:  rheumatoid arthritis No date: B12 deficiency No date: Cancer (HCC)     Comment:  skin cancer (squamous cell) No date: Collagen vascular disease (HCC)     Comment:  RA No date: Dermatitis No date: Diabetes mellitus without complication (HCC)     Comment:  type 2 No date: Eczema No date: Fibrosis, pulmonary, interstitial, diffuse (HCC) No date: GERD (gastroesophageal reflux disease) No date: Headache     Comment:   migraines in the past (none since menopause) No date: Heart murmur No date: History of hiatal hernia No date: Hives     Comment:  "chronic" No date: Hypertension No date: Loose stools No date: Osteoarthritis     Comment:  lumbar spine, cervical spine, plantar fascitis  No date: Pneumonia No date: PONV (postoperative nausea and vomiting) No date: Psoriasis No date: Rosacea 2004: Sleep apnea     Comment:  does not use cpap 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 followed by Dr Leonette Monarch  No date: Urticaria     Comment:  chronic  No date: Varicose veins  Reproductive/Obstetrics                             Anesthesia Physical Anesthesia Plan  ASA: 3  Anesthesia Plan: General   Post-op Pain Management: Ofirmev IV (intra-op)*   Induction: Intravenous  PONV Risk Score and Plan: 4 or greater and Dexamethasone, Ondansetron, TIVA and Propofol infusion  Airway Management Planned: LMA  Additional Equipment: None  Intra-op Plan:   Post-operative Plan: Extubation in OR  Informed Consent: I have reviewed the patients History and Physical, chart, labs and discussed the procedure including the risks, benefits and alternatives for the proposed anesthesia with the patient or authorized representative who has indicated his/her understanding and acceptance.     Dental advisory given  Plan Discussed with: CRNA and Anesthesiologist  Anesthesia Plan Comments: (Discussed risks of anesthesia with patient, including PONV, sore throat, lip/dental/eye damage. Rare risks discussed as well, such as cardiorespiratory and neurological  sequelae, and allergic reactions. Discussed the role of CRNA in patient's perioperative care. Patient understands.)        Anesthesia Quick Evaluation

## 2022-06-20 ENCOUNTER — Encounter: Payer: Self-pay | Admitting: General Surgery

## 2022-07-24 ENCOUNTER — Other Ambulatory Visit: Payer: Self-pay

## 2022-07-24 ENCOUNTER — Inpatient Hospital Stay
Admission: EM | Admit: 2022-07-24 | Discharge: 2022-07-27 | DRG: 300 | Disposition: A | Discharge status: Home Health | Payer: Medicare Other | Attending: Internal Medicine | Admitting: Internal Medicine

## 2022-07-24 ENCOUNTER — Emergency Department: Payer: Medicare Other

## 2022-07-24 ENCOUNTER — Observation Stay: Payer: Medicare Other

## 2022-07-24 DIAGNOSIS — L309 Dermatitis, unspecified: Secondary | ICD-10-CM | POA: Diagnosis present

## 2022-07-24 DIAGNOSIS — E11621 Type 2 diabetes mellitus with foot ulcer: Secondary | ICD-10-CM | POA: Diagnosis present

## 2022-07-24 DIAGNOSIS — Z9049 Acquired absence of other specified parts of digestive tract: Secondary | ICD-10-CM

## 2022-07-24 DIAGNOSIS — Z981 Arthrodesis status: Secondary | ICD-10-CM

## 2022-07-24 DIAGNOSIS — N179 Acute kidney failure, unspecified: Secondary | ICD-10-CM | POA: Diagnosis present

## 2022-07-24 DIAGNOSIS — J8410 Pulmonary fibrosis, unspecified: Secondary | ICD-10-CM | POA: Diagnosis not present

## 2022-07-24 DIAGNOSIS — L03119 Cellulitis of unspecified part of limb: Principal | ICD-10-CM

## 2022-07-24 DIAGNOSIS — Z888 Allergy status to other drugs, medicaments and biological substances status: Secondary | ICD-10-CM

## 2022-07-24 DIAGNOSIS — M059 Rheumatoid arthritis with rheumatoid factor, unspecified: Secondary | ICD-10-CM

## 2022-07-24 DIAGNOSIS — Z79899 Other long term (current) drug therapy: Secondary | ICD-10-CM

## 2022-07-24 DIAGNOSIS — L97529 Non-pressure chronic ulcer of other part of left foot with unspecified severity: Secondary | ICD-10-CM | POA: Diagnosis present

## 2022-07-24 DIAGNOSIS — Z9849 Cataract extraction status, unspecified eye: Secondary | ICD-10-CM

## 2022-07-24 DIAGNOSIS — K219 Gastro-esophageal reflux disease without esophagitis: Secondary | ICD-10-CM | POA: Diagnosis present

## 2022-07-24 DIAGNOSIS — Z79620 Long term (current) use of immunosuppressive biologic: Secondary | ICD-10-CM

## 2022-07-24 DIAGNOSIS — L97509 Non-pressure chronic ulcer of other part of unspecified foot with unspecified severity: Secondary | ICD-10-CM

## 2022-07-24 DIAGNOSIS — L409 Psoriasis, unspecified: Secondary | ICD-10-CM | POA: Diagnosis present

## 2022-07-24 DIAGNOSIS — Z961 Presence of intraocular lens: Secondary | ICD-10-CM | POA: Diagnosis present

## 2022-07-24 DIAGNOSIS — E785 Hyperlipidemia, unspecified: Secondary | ICD-10-CM | POA: Diagnosis present

## 2022-07-24 DIAGNOSIS — Z825 Family history of asthma and other chronic lower respiratory diseases: Secondary | ICD-10-CM

## 2022-07-24 DIAGNOSIS — Z803 Family history of malignant neoplasm of breast: Secondary | ICD-10-CM

## 2022-07-24 DIAGNOSIS — Z885 Allergy status to narcotic agent status: Secondary | ICD-10-CM

## 2022-07-24 DIAGNOSIS — I890 Lymphedema, not elsewhere classified: Secondary | ICD-10-CM | POA: Diagnosis present

## 2022-07-24 DIAGNOSIS — Z8249 Family history of ischemic heart disease and other diseases of the circulatory system: Secondary | ICD-10-CM

## 2022-07-24 DIAGNOSIS — M069 Rheumatoid arthritis, unspecified: Secondary | ICD-10-CM | POA: Diagnosis present

## 2022-07-24 DIAGNOSIS — D696 Thrombocytopenia, unspecified: Secondary | ICD-10-CM | POA: Diagnosis present

## 2022-07-24 DIAGNOSIS — E1152 Type 2 diabetes mellitus with diabetic peripheral angiopathy with gangrene: Secondary | ICD-10-CM | POA: Diagnosis not present

## 2022-07-24 DIAGNOSIS — E119 Type 2 diabetes mellitus without complications: Secondary | ICD-10-CM

## 2022-07-24 DIAGNOSIS — I10 Essential (primary) hypertension: Secondary | ICD-10-CM | POA: Diagnosis present

## 2022-07-24 DIAGNOSIS — G4733 Obstructive sleep apnea (adult) (pediatric): Secondary | ICD-10-CM | POA: Diagnosis present

## 2022-07-24 DIAGNOSIS — M479 Spondylosis, unspecified: Secondary | ICD-10-CM | POA: Diagnosis present

## 2022-07-24 DIAGNOSIS — L97521 Non-pressure chronic ulcer of other part of left foot limited to breakdown of skin: Secondary | ICD-10-CM | POA: Diagnosis present

## 2022-07-24 DIAGNOSIS — Z8261 Family history of arthritis: Secondary | ICD-10-CM

## 2022-07-24 DIAGNOSIS — Z91041 Radiographic dye allergy status: Secondary | ICD-10-CM

## 2022-07-24 DIAGNOSIS — Z7982 Long term (current) use of aspirin: Secondary | ICD-10-CM

## 2022-07-24 DIAGNOSIS — I739 Peripheral vascular disease, unspecified: Secondary | ICD-10-CM | POA: Diagnosis not present

## 2022-07-24 DIAGNOSIS — Z85828 Personal history of other malignant neoplasm of skin: Secondary | ICD-10-CM

## 2022-07-24 DIAGNOSIS — T502X5A Adverse effect of carbonic-anhydrase inhibitors, benzothiadiazides and other diuretics, initial encounter: Secondary | ICD-10-CM | POA: Diagnosis present

## 2022-07-24 DIAGNOSIS — E538 Deficiency of other specified B group vitamins: Secondary | ICD-10-CM | POA: Diagnosis present

## 2022-07-24 DIAGNOSIS — Z794 Long term (current) use of insulin: Secondary | ICD-10-CM

## 2022-07-24 DIAGNOSIS — L03116 Cellulitis of left lower limb: Secondary | ICD-10-CM | POA: Diagnosis present

## 2022-07-24 DIAGNOSIS — Z820 Family history of epilepsy and other diseases of the nervous system: Secondary | ICD-10-CM

## 2022-07-24 DIAGNOSIS — L03115 Cellulitis of right lower limb: Secondary | ICD-10-CM | POA: Diagnosis present

## 2022-07-24 DIAGNOSIS — L97519 Non-pressure chronic ulcer of other part of right foot with unspecified severity: Secondary | ICD-10-CM | POA: Diagnosis present

## 2022-07-24 DIAGNOSIS — I70263 Atherosclerosis of native arteries of extremities with gangrene, bilateral legs: Secondary | ICD-10-CM | POA: Diagnosis present

## 2022-07-24 LAB — COMPREHENSIVE METABOLIC PANEL
ALT: 14 U/L (ref 0–44)
AST: 21 U/L (ref 15–41)
Albumin: 3.2 g/dL — ABNORMAL LOW (ref 3.5–5.0)
Alkaline Phosphatase: 120 U/L (ref 38–126)
Anion gap: 10 (ref 5–15)
BUN: 59 mg/dL — ABNORMAL HIGH (ref 8–23)
CO2: 22 mmol/L (ref 22–32)
Calcium: 9.6 mg/dL (ref 8.9–10.3)
Chloride: 105 mmol/L (ref 98–111)
Creatinine, Ser: 1.7 mg/dL — ABNORMAL HIGH (ref 0.44–1.00)
GFR, Estimated: 30 mL/min — ABNORMAL LOW (ref >60)
Glucose, Bld: 164 mg/dL — ABNORMAL HIGH (ref 70–99)
Potassium: 4.6 mmol/L (ref 3.5–5.1)
Sodium: 137 mmol/L (ref 135–145)
Total Bilirubin: 0.6 mg/dL (ref 0.3–1.2)
Total Protein: 7.1 g/dL (ref 6.5–8.1)

## 2022-07-24 LAB — CBC
HCT: 38.4 % (ref 36.0–46.0)
Hemoglobin: 12.7 g/dL (ref 12.0–15.0)
MCH: 28.9 pg (ref 26.0–34.0)
MCHC: 33.1 g/dL (ref 30.0–36.0)
MCV: 87.3 fL (ref 80.0–100.0)
Platelets: 148 10*3/uL — ABNORMAL LOW (ref 150–400)
RBC: 4.4 MIL/uL (ref 3.87–5.11)
RDW: 13.5 % (ref 11.5–15.5)
WBC: 8.7 10*3/uL (ref 4.0–10.5)
nRBC: 0 % (ref 0.0–0.2)

## 2022-07-24 LAB — BRAIN NATRIURETIC PEPTIDE: B Natriuretic Peptide: 39.5 pg/mL (ref 0.0–100.0)

## 2022-07-24 MED ORDER — SODIUM CHLORIDE 0.9% FLUSH
3.0000 mL | Freq: Two times a day (BID) | INTRAVENOUS | Status: DC
  Administered 2022-07-25 – 2022-07-27 (×4): 3 mL via INTRAVENOUS

## 2022-07-24 MED ORDER — SODIUM CHLORIDE 0.9 % IV SOLN
2.0000 g | Freq: Once | INTRAVENOUS | Status: AC
  Administered 2022-07-24: 2 g via INTRAVENOUS
  Filled 2022-07-24: qty 12.5

## 2022-07-24 MED ORDER — LACTATED RINGERS IV BOLUS: 500.0000 mL | Freq: Once | INTRAVENOUS | Status: DC

## 2022-07-24 MED ORDER — INSULIN GLARGINE-YFGN 100 UNIT/ML ~~LOC~~ SOLN
10.0000 [IU] | Freq: Every day | SUBCUTANEOUS | Status: DC
  Administered 2022-07-25 (×2): 10 [IU] via SUBCUTANEOUS
  Filled 2022-07-24 (×3): qty 0.1

## 2022-07-24 MED ORDER — MONTELUKAST SODIUM 10 MG PO TABS
10.0000 mg | ORAL_TABLET | Freq: Every day | ORAL | Status: DC
  Administered 2022-07-25 – 2022-07-27 (×3): 10 mg via ORAL
  Filled 2022-07-24 (×3): qty 1

## 2022-07-24 MED ORDER — PANTOPRAZOLE SODIUM 40 MG PO TBEC
40.0000 mg | DELAYED_RELEASE_TABLET | Freq: Two times a day (BID) | ORAL | Status: DC
  Administered 2022-07-25 – 2022-07-27 (×6): 40 mg via ORAL
  Filled 2022-07-24 (×6): qty 1

## 2022-07-24 MED ORDER — SODIUM CHLORIDE 0.9 % IV SOLN
2.0000 g | INTRAVENOUS | Status: DC
  Administered 2022-07-26: 2 g via INTRAVENOUS
  Filled 2022-07-24 (×3): qty 20

## 2022-07-24 MED ORDER — LORATADINE 10 MG PO TABS
10.0000 mg | ORAL_TABLET | Freq: Every day | ORAL | Status: DC
  Administered 2022-07-25 – 2022-07-27 (×3): 10 mg via ORAL
  Filled 2022-07-24 (×4): qty 1

## 2022-07-24 MED ORDER — METRONIDAZOLE 500 MG/100ML IV SOLN
500.0000 mg | Freq: Two times a day (BID) | INTRAVENOUS | Status: DC
  Administered 2022-07-25 – 2022-07-27 (×4): 500 mg via INTRAVENOUS
  Filled 2022-07-24 (×6): qty 100

## 2022-07-24 MED ORDER — GABAPENTIN 100 MG PO CAPS
100.0000 mg | ORAL_CAPSULE | Freq: Two times a day (BID) | ORAL | Status: DC
  Administered 2022-07-25: 100 mg via ORAL
  Filled 2022-07-24 (×2): qty 1

## 2022-07-24 MED ORDER — ACETAMINOPHEN 650 MG RE SUPP: 650.0000 mg | Freq: Four times a day (QID) | RECTAL | Status: DC | PRN

## 2022-07-24 MED ORDER — POLYETHYLENE GLYCOL 3350 17 G PO PACK: 17.0000 g | PACK | Freq: Every day | ORAL | Status: DC | PRN

## 2022-07-24 MED ORDER — ONDANSETRON HCL 4 MG PO TABS: 4.0000 mg | ORAL_TABLET | Freq: Four times a day (QID) | ORAL | Status: DC | PRN

## 2022-07-24 MED ORDER — VANCOMYCIN HCL IN DEXTROSE 1-5 GM/200ML-% IV SOLN
1000.0000 mg | Freq: Once | INTRAVENOUS | Status: AC
  Administered 2022-07-24: 1000 mg via INTRAVENOUS
  Filled 2022-07-24: qty 200

## 2022-07-24 MED ORDER — LACTATED RINGERS IV SOLN: INTRAVENOUS | Status: DC

## 2022-07-24 MED ORDER — SIMVASTATIN 20 MG PO TABS
40.0000 mg | ORAL_TABLET | Freq: Every day | ORAL | Status: DC
  Administered 2022-07-25 – 2022-07-26 (×3): 40 mg via ORAL
  Filled 2022-07-24 (×4): qty 2

## 2022-07-24 MED ORDER — BISOPROLOL FUMARATE 5 MG PO TABS
10.0000 mg | ORAL_TABLET | Freq: Every day | ORAL | Status: DC
  Administered 2022-07-25 – 2022-07-27 (×3): 10 mg via ORAL
  Filled 2022-07-24 (×3): qty 2

## 2022-07-24 MED ORDER — FAMOTIDINE 20 MG PO TABS
40.0000 mg | ORAL_TABLET | Freq: Every day | ORAL | Status: DC
  Administered 2022-07-25 – 2022-07-27 (×3): 40 mg via ORAL
  Filled 2022-07-24 (×3): qty 2

## 2022-07-24 MED ORDER — LEFLUNOMIDE 10 MG PO TABS: 10.0000 mg | ORAL_TABLET | Freq: Every day | ORAL | Status: DC

## 2022-07-24 MED ORDER — ASPIRIN 81 MG PO TBEC
81.0000 mg | DELAYED_RELEASE_TABLET | Freq: Every day | ORAL | Status: DC
  Administered 2022-07-25 – 2022-07-27 (×3): 81 mg via ORAL
  Filled 2022-07-24 (×3): qty 1

## 2022-07-24 MED ORDER — BACITRACIN ZINC 500 UNIT/GM EX OINT
TOPICAL_OINTMENT | Freq: Two times a day (BID) | CUTANEOUS | Status: DC
  Administered 2022-07-25 – 2022-07-26 (×2): 1 via TOPICAL
  Filled 2022-07-24: qty 0.9

## 2022-07-24 MED ORDER — HYDROCODONE-ACETAMINOPHEN 5-325 MG PO TABS
1.0000 | ORAL_TABLET | Freq: Four times a day (QID) | ORAL | Status: DC | PRN
  Administered 2022-07-25 (×2): 1 via ORAL
  Filled 2022-07-24 (×3): qty 1

## 2022-07-24 MED ORDER — VANCOMYCIN HCL IN DEXTROSE 1-5 GM/200ML-% IV SOLN
1000.0000 mg | Freq: Once | INTRAVENOUS | Status: AC
  Administered 2022-07-25: 1000 mg via INTRAVENOUS
  Filled 2022-07-24: qty 200

## 2022-07-24 MED ORDER — ACETAMINOPHEN 325 MG PO TABS: 650.0000 mg | ORAL_TABLET | Freq: Four times a day (QID) | ORAL | Status: DC | PRN

## 2022-07-24 MED ORDER — VANCOMYCIN VARIABLE DOSE PER UNSTABLE RENAL FUNCTION (PHARMACIST DOSING): Status: DC

## 2022-07-24 MED ORDER — ENOXAPARIN SODIUM 30 MG/0.3ML IJ SOSY
30.0000 mg | PREFILLED_SYRINGE | INTRAMUSCULAR | Status: DC
  Administered 2022-07-25 (×2): 30 mg via SUBCUTANEOUS
  Filled 2022-07-24 (×2): qty 0.3

## 2022-07-24 MED ORDER — ONDANSETRON HCL 4 MG/2ML IJ SOLN: 4.0000 mg | Freq: Four times a day (QID) | INTRAMUSCULAR | Status: DC | PRN

## 2022-07-24 NOTE — Assessment & Plan Note (Signed)
As noted above, repeating ABIs for further characterization.  Previously followed with vascular surgery with ABIs demonstrating noncompressible arteries however PAD was not considered significant.

## 2022-07-24 NOTE — Assessment & Plan Note (Signed)
-   CPAP at bedtime 

## 2022-07-24 NOTE — ED Notes (Signed)
ED TO INPATIENT HANDOFF REPORT  ED Nurse Name and Phone #: Milagros Loll [phone]  S Name/Age/Gender Mendel Corning 82 y.o. female Room/Bed: ED10A/ED10A  Code Status   Code Status: Full Code  Home/SNF/Other Home Patient oriented to: situation Is this baseline? Yes   Triage Complete: Triage complete  Chief Complaint Toe ulcer (HCC) [L97.509]  Triage Note Pt sts that she has infection. Pt sts that her legs has been red and weaping for the last couple of weeks. Pt sts that her dermatologist gave her new cream but there are new places coming up every day.   Allergies Allergies  Allergen Reactions   Methotrexate Derivatives Shortness Of Breath    Breathing difficulties    Acyclovir And Related Rash   Codeine Nausea Only   Contrast Media [Iodinated Contrast Media] Itching and Rash   Inderide [Propranolol-Hctz] Rash   Lodine [Etodolac] Rash   Metrizamide Itching and Rash   Procardia [Nifedipine] Rash    Level of Care/Admitting Diagnosis ED Disposition     ED Disposition  Admit   Condition  --   Comment  Hospital Area: [facility] [[id number]]  Level of Care: Telemetry Medical [104]  Covid Evaluation: Asymptomatic - no recent exposure (last 10 days) testing not required  Diagnosis: Toe ulcer (HCC) [621308]  Admitting Physician: Verdene Lennert [[id number]]  Attending Physician: Verdene Lennert [[id number]]          B Medical/Surgery History Past Medical History:  Diagnosis Date   Arthritis    rheumatoid arthritis   B12 deficiency    Cancer (HCC)    skin cancer (squamous cell)   Collagen vascular disease (HCC)    RA   Dermatitis    Diabetes mellitus without complication (HCC)    type 2   Eczema    Fibrosis, pulmonary, interstitial, diffuse (HCC)    GERD (gastroesophageal reflux disease)    Headache    migraines in the past (none since menopause)   Heart murmur    History of hiatal hernia    Hives    "chronic"   Hypertension    Loose stools     Osteoarthritis    lumbar spine, cervical spine, plantar fascitis    Pneumonia    PONV (postoperative nausea and vomiting)    Psoriasis    Rosacea    Sleep apnea 2004   does not use cpap   Urethral stenosis    w/ bladder polyps. followed by Dr Leonette Monarch    Urticaria    chronic    Varicose veins    Past Surgical History:  Procedure Laterality Date   ANTERIOR CERVICAL DECOMP/DISCECTOMY FUSION N/A 08/20/2017   Procedure: Anterior Cervical Decompression Fusion - Cervical Four - Cervical Five - Cervical Five - Cervical Six;  Surgeon: Julio Sicks, MD;  Location: [facility] OR;  Service: Neurosurgery;  Laterality: N/A;  Anterior Cervical Decompression Fusion - Cervical Four - Cervical Five - Cervical Five - Cervical Six   APPENDECTOMY     BILATERAL CARPAL TUNNEL RELEASE     BREAST BIOPSY Left 04/24/2022   [place] Bx, 6:00 heart clip, SUBAREOLAR BREAST TISSUE WITH RUPTURED DUCT ECTASIA.   BREAST BIOPSY Left 04/24/2022   [place] bx, 4:00 coil clip, SUBAREOLAR BREAST TISSUE WITH AREAS OF STROMAL FIBROSIS.   BREAST BIOPSY Left 04/24/2022   [place] LT BREAST BX W LOC DEV 1ST LESION IMG BX SPEC US GUIDE 04/24/2022 [facility]   BREAST BIOPSY Left 04/24/2022   [place] LT BREAST BX W LOC DEV EA ADD LESION  IMG BX SPEC US GUIDE 04/24/2022 [facility]   BREAST BIOPSY WITH RADIO FREQUENCY LOCALIZER Left 06/19/2022   Procedure: BREAST BIOPSY WITH RADIO FREQUENCY LOCALIZER;  Surgeon: Carolan Shiver, MD;  Location: [facility] ORS;  Service: General;  Laterality: Left;   BREAST CYST ASPIRATION Left    neg   CATARACT EXTRACTION Bilateral    CHOLECYSTECTOMY  1963   COLONOSCOPY WITH PROPOFOL N/A 12/31/2015   Procedure: COLONOSCOPY WITH PROPOFOL;  Surgeon: Christena Deem, MD;  Location: [facility]COPY;  Service: Endoscopy;  Laterality: N/A;   ENDOMETRIAL ABLATION  1991   ESOPHAGOGASTRODUODENOSCOPY (EGD) WITH PROPOFOL N/A 12/31/2015   Procedure: ESOPHAGOGASTRODUODENOSCOPY (EGD) WITH PROPOFOL;  Surgeon: Christena Deem, MD;   Location: [facility]OSCOPY;  Service: Endoscopy;  Laterality: N/A;   ESOPHAGOGASTRODUODENOSCOPY (EGD) WITH PROPOFOL N/A 06/02/2016   Procedure: ESOPHAGOGASTRODUODENOSCOPY (EGD) WITH PROPOFOL;  Surgeon: Christena Deem, MD;  Location: [facility] ENDOSCOPY;  Service: Endoscopy;  Laterality: N/A;   ESOPHAGOGASTRODUODENOSCOPY (EGD) WITH PROPOFOL N/A 11/07/2016   Procedure: ESOPHAGOGASTRODUODENOSCOPY (EGD) WITH PROPOFOL;  Surgeon: Christena Deem, MD;  Location: [facility] END[facility]COPY;  Service: Endoscopy;  Laterality: N/A;   EYE SURGERY Bilateral 2006 and 2012   cataract surgery with lens implant   eyelid surgery Bilateral 2014   FOOT SURGERY Right    ligament and spurs   LUMBAR LAMINECTOMY/DECOMPRESSION MICRODISCECTOMY Right 12/24/2013   Procedure: RIGHT LUMBAR THREE TO FOUR, LUMBAR FOUR TO FIVE, LUMBAR FIVE TO SACRAL ONE LAMINECTOMY/FORAMINOTOMY;  Surgeon: Karn Cassis, MD;  Location: [facility] ORS;  Service: Neurosurgery;  Laterality: Right;  RIGHT L3-4 L4-5 L5-S1 LAMINECTOMY/FORAMINOTOMY   NASAL SEPTUM SURGERY  2004   SHOULDER SURGERY Right    for a frozen shoulder   SKIN CANCER EXCISION Right    leg x 4   skin cancer removal     TONSILLECTOMY     TUBAL LIGATION  1968     A IV Location/Drains/Wounds Patient Lines/Drains/Airways Status     Active Line/Drains/Airways     Name Placement date Placement time Site Days   Peripheral IV 07/24/22 22 G 1" Anterior;Left Forearm 07/24/22  1723  Forearm  less than 1            Intake/Output Last 24 hours  Intake/Output Summary (Last 24 hours) at 07/24/2022 2051 Last data filed at 07/24/2022 1845 Gross per 24 hour  Intake 300 ml  Output --  Net 300 ml    Labs/Imaging Results for orders placed or performed during the hospital encounter of 07/24/22 (from the past 48 hour(s))  CBC     Status: Abnormal   Collection Time: 07/24/22  3:02 PM  Result Value Ref Range   WBC 8.7 4.0 - 10.5 K/uL   RBC 4.40 3.87 - 5.11 MIL/uL   Hemoglobin 12.7 12.0  - 15.0 g/dL   HCT 80.9 98.3 - 38.2 %   MCV 87.3 80.0 - 100.0 fL   MCH 28.9 26.0 - 34.0 pg   MCHC 33.1 30.0 - 36.0 g/dL   RDW 50.5 39.7 - 67.3 %   Platelets 148 (L) 150 - 400 K/uL   nRBC 0.0 0.0 - 0.2 %    Comment: Performed at [facility], [address] [place], [place] [address]  Comprehensive metabolic panel     Status: Abnormal   Collection Time: 07/24/22  3:02 PM  Result Value Ref Range   Sodium 137 135 - 145 mmol/L   Potassium 4.6 3.5 - 5.1 mmol/L   Chloride 105 98 - 111 mmol/L  CO2 22 22 - 32 mmol/L   Glucose, Bld 164 (H) 70 - 99 mg/dL    Comment: Glucose reference range applies only to samples taken after fasting for at least 8 hours.   BUN 59 (H) 8 - 23 mg/dL   Creatinine, Ser 4.09 (H) 0.44 - 1.00 mg/dL   Calcium 9.6 8.9 - 81.1 mg/dL   Total Protein 7.1 6.5 - 8.1 g/dL   Albumin 3.2 (L) 3.5 - 5.0 g/dL   AST 21 15 - 41 U/L   ALT 14 0 - 44 U/L   Alkaline Phosphatase 120 38 - 126 U/L   Total Bilirubin 0.6 0.3 - 1.2 mg/dL   GFR, Estimated 30 (L) >60 mL/min    Comment: (NOTE) Calculated using the CKD-EPI Creatinine Equation (2021)    Anion gap 10 5 - 15    Comment: Performed at [facility], [address] [place], [place] [address]  Brain natriuretic peptide     Status: None   Collection Time: 07/24/22  4:29 PM  Result Value Ref Range   B Natriuretic Peptide 39.5 0.0 - 100.0 pg/mL    Comment: Performed at [facility], [address], [place], [place] [address]   DG Foot Complete Left  Result Date: 07/24/2022 CLINICAL DATA:  Ulceration and cellulitis. Query osteo. Bilateral infections in the toes not improving on antibiotics. EXAM: LEFT FOOT - COMPLETE 3+ VIEW COMPARISON:  None Available. FINDINGS: Diffuse bone demineralization. Degenerative changes in the interphalangeal joints, first metatarsal-phalangeal joint, and intertarsal joints. No focal bone erosion or bone sclerosis that would suggest evidence of osteomyelitis. Vascular  calcifications in the soft tissues. Calcifications in the plantar soft tissues over the heel may represent evidence of plantar fasciitis or possibly phleboliths. IMPRESSION: Diffuse bone demineralization and degenerative changes. No acute bony abnormalities. Plantar soft tissue calcification may indicate plantar fasciitis. Electronically Signed   By: Burman Nieves M.D.   On: 07/24/2022 17:35   DG Foot Complete Right  Result Date: 07/24/2022 CLINICAL DATA:  Ulceration and cellulitis. Query osteo. Bilateral infections in the toes not improving on antibiotics. EXAM: RIGHT FOOT COMPLETE - 3+ VIEW COMPARISON:  None Available. FINDINGS: Diffuse bone demineralization. Degenerative changes in the interphalangeal, first metatarsal-phalangeal, and intertarsal joints. No evidence of acute fracture or dislocation. No focal bone sclerosis or bone erosion to suggest evidence of osteomyelitis. Mild soft tissue swelling on the dorsum of the foot. Vascular calcifications. IMPRESSION: Demineralization and degenerative changes. No acute bony abnormalities. Electronically Signed   By: Burman Nieves M.D.   On: 07/24/2022 17:33   DG Chest Port 1 View  Result Date: 07/24/2022 CLINICAL DATA:  Shortness of breath. EXAM: PORTABLE CHEST 1 VIEW COMPARISON:  Chest radiographs 09/02/2021 and chest CTA 09/03/2021 FINDINGS: The cardiomediastinal silhouette is unchanged with normal heart size. There is mild chronic accentuation of the interstitial markings. No overt pulmonary edema, airspace consolidation, sizeable pleural effusion, or pneumothorax is identified. Prior cervical spine fusion is noted. IMPRESSION: No active disease. Electronically Signed   By: Sebastian Ache M.D.   On: 07/24/2022 16:43    Pending Labs Unresulted Labs (From admission, onward)     Start     Ordered   07/25/22 2000  Vancomycin, random  Once-Timed,   TIMED        07/24/22 2030   07/24/22 1943  Culture, blood (Routine X 2) w Reflex to ID Panel  BLOOD  CULTURE X 2,   TIMED      07/24/22 1944  Vitals/Pain Today's Vitals   07/24/22 1815 07/24/22 1845 07/24/22 1854 07/24/22 1900  BP:   (!) 167/78   Pulse: 60   63  Resp: 16  15 18   Temp:      TempSrc:      SpO2: 100% 100%    Weight:      Height:      PainSc:        Isolation Precautions No active isolations  Medications Medications  bacitracin ointment (has no administration in time range)  enoxaparin (LOVENOX) injection 30 mg (has no administration in time range)  sodium chloride flush (NS) 0.9 % injection 3 mL (has no administration in time range)  acetaminophen (TYLENOL) tablet 650 mg (has no administration in time range)    Or  acetaminophen (TYLENOL) suppository 650 mg (has no administration in time range)  ondansetron (ZOFRAN) tablet 4 mg (has no administration in time range)    Or  ondansetron (ZOFRAN) injection 4 mg (has no administration in time range)  polyethylene glycol (MIRALAX / GLYCOLAX) packet 17 g (has no administration in time range)  HYDROcodone-acetaminophen (NORCO/VICODIN) 5-325 MG per tablet 1 tablet (has no administration in time range)  lactated ringers bolus 500 mL (has no administration in time range)  lactated ringers infusion (has no administration in time range)  cefTRIAXone (ROCEPHIN) 2 g in sodium chloride 0.9 % 100 mL IVPB (has no administration in time range)  metroNIDAZOLE (FLAGYL) IVPB 500 mg (has no administration in time range)  vancomycin (VANCOCIN) IVPB 1000 mg/200 mL premix (has no administration in time range)  vancomycin variable dose per unstable renal function (pharmacist dosing) (has no administration in time range)  ceFEPIme (MAXIPIME) 2 g in sodium chloride 0.9 % 100 mL IVPB (0 g Intravenous Stopped 07/24/22 1809)  vancomycin (VANCOCIN) IVPB 1000 mg/200 mL premix (0 mg Intravenous Stopped 07/24/22 1845)    Mobility walks with device     Focused Assessments Cardiac Assessment Handoff:  Cardiac Rhythm: Normal  sinus rhythm No results found for: "CKTOTAL", "CKMB", "CKMBINDEX", "TROPONINI" Lab Results  Component Value Date   DDIMER 1.58 (H) 09/03/2021   Does the Patient currently have chest pain? No    R Recommendations: See Admitting Provider Note  Report given to:   Additional Notes: N/A

## 2022-07-24 NOTE — Assessment & Plan Note (Signed)
Patient has a history of rheumatoid arthritis currently being managed with Remicade and leflunomide.  She notes that she has been on Remicade for nearly 10 years now.

## 2022-07-24 NOTE — Progress Notes (Signed)
   07/24/22 2106  BiPAP/CPAP/SIPAP  Reason BIPAP/CPAP not in use Non-compliant (Patient states she does not wear, does not want to wear during this admission.)

## 2022-07-24 NOTE — ED Notes (Signed)
Pt assisted to bedside toilet. Hat in toilet to collect sample if needed later. Will send to lab. Pt offered non-slip socks; refused due to wounds on toes.

## 2022-07-24 NOTE — ED Notes (Signed)
Urine sample sent to lab. 300cc urine emptied from hat.

## 2022-07-24 NOTE — ED Notes (Signed)
Called lab to add on BNP. Joyice Faster confirms doing so now.

## 2022-07-24 NOTE — ED Notes (Signed)
Received report back from float RN.

## 2022-07-24 NOTE — Assessment & Plan Note (Signed)
Likely due to increased Lasix dose over the past 1 week.  AKI likely due to overdiuresis.  - 500 cc LR bolus followed by maintenance fluids - Repeat BMP in the a.m. - Avoid nephrotoxic agents

## 2022-07-24 NOTE — ED Notes (Signed)
Pt has L breast wound from recent surgery; wound red around edges with black center; not currently draining anything.

## 2022-07-24 NOTE — Assessment & Plan Note (Signed)
-   Hold home antiglycemic agents - SSI, moderate 

## 2022-07-24 NOTE — ED Notes (Signed)
Bacitracin ointment and bandaides applied to L breast wound.

## 2022-07-24 NOTE — Progress Notes (Signed)
PHARMACY -  BRIEF ANTIBIOTIC NOTE   Pharmacy has received consult for vancomycin from an ED provider.  The patient's profile has been reviewed for ht/wt/allergies/indication/available labs.    One time order placed for vancomycin 1,000 mg x 1  Further antibiotics/pharmacy consults should be ordered by admitting physician if indicated.                       Thank you,  Elliot Gurney, PharmD, BCPS Clinical Pharmacist  07/24/2022 4:54 PM

## 2022-07-24 NOTE — ED Provider Notes (Signed)
[facility] Provider Note    Event Date/Time   First MD Initiated Contact with Patient 07/24/22 1530     (approximate)   History   Wound Infection   HPI  Courtney [name] is a 82 y.o. female   Past medical history of rheumatoid arthritis on Remicade and hypertension, hyperlipidemia, and diabetic who presents emergency department with worsening foot wounds concern for infection has been on 1 week of Levaquin oral antibiotic with worsening changes surrounding the toes on either feet as well as cellulitic changes surrounding a recent breast biopsy.  She denies any systemic symptoms like fevers or chills.  She has been fully compliant with her Levaquin.  She developed some ulcerations on her toes that are painful to walk.  She has lymphedema to bilateral lower extremities and has been increasing her diuretic dosing to help with swelling per her doctor's recommendations.  She does not have a history of heart failure.  She has had some dyspnea that been worsening over the last several days as well.  Denies any respiratory infectious symptoms like fever or cough.  Independent Historian contributed to assessment above: Daughter is at bedside to corroborate information given above    Physical Exam   Triage Vital Signs: ED Triage Vitals  Encounter Vitals Group     BP 07/24/22 1501 126/67     Systolic BP Percentile --      Diastolic BP Percentile --      Pulse Rate 07/24/22 1500 88     Resp 07/24/22 1500 17     Temp 07/24/22 1500 99 F (37.2 C)     Temp Source 07/24/22 1500 Oral     SpO2 07/24/22 1500 100 %     Weight 07/24/22 1500 180 lb (81.6 kg)     Height 07/24/22 1500 5\' 6"  (1.676 m)     Head Circumference --      Peak Flow --      Pain Score 07/24/22 1500 5     Pain Loc --      Pain Education --      Exclude from Growth Chart --     Most recent vital signs: Vitals:   07/24/22 1628 07/24/22 1630  BP:  (!) 118/58  Pulse:    Resp: 16 12  Temp:     SpO2:      General: Awake, no distress.  CV:  Good peripheral perfusion.  Resp:  Normal effort.  No obvious focalities or rales. Abd:  No distention.  Other:  Cellulitic changes to the second toe on either foot.  There is an ulceration black necrotic tissue on bottom of the right second digit.  There exists an ulceration on top of the left second digit.  Erythema warmth cellulitic changes.  She also has scar on the left breast from her biopsy with cellulitic changes surrounding.  No crepitus or pain out of proportion, vital signs otherwise normal afebrile nontoxic-appearing..  The feet otherwise appear warm well-perfused.   ED Results / Procedures / Treatments   Labs (all labs ordered are listed, but only abnormal results are displayed) Labs Reviewed  CBC - Abnormal; Notable for the following components:      Result Value   Platelets 148 (*)    All other components within normal limits  COMPREHENSIVE METABOLIC PANEL - Abnormal; Notable for the following components:   Glucose, Bld 164 (*)    BUN 59 (*)    Creatinine, Ser 1.70 (*)  Albumin 3.2 (*)    GFR, Estimated 30 (*)    All other components within normal limits  BRAIN NATRIURETIC PEPTIDE     I ordered and reviewed the above labs they are notable for she is a creatinine of 1.7 compared to 1.0 obtained a couple months ago from outside hospital labs.      RADIOLOGY I independently reviewed and interpreted chest x-ray and I see no obvious focalities or pneumothorax   PROCEDURES:  Critical Care performed: No  Procedures   MEDICATIONS ORDERED IN ED: Medications  bacitracin ointment (has no administration in time range)  ceFEPIme (MAXIPIME) 2 g in sodium chloride 0.9 % 100 mL IVPB (has no administration in time range)    External physician / consultants:  I spoke with hospitalist for admission and regarding care plan for this patient.   IMPRESSION / MDM / ASSESSMENT AND PLAN / ED COURSE  I reviewed the triage  vital signs and the nursing notes.                                Patient's presentation is most consistent with acute presentation with potential threat to life or bodily function.  Differential diagnosis includes, but is not limited to, cellulitis, osteomyelitis, heart failure exacerbation, lymphedema   =The patient is on the cardiac monitor to evaluate for evidence of arrhythmia and/or significant heart rate changes.  MDM:   Patient with cellulitis to the bilateral toes with ulceration, as well as cellulitic changes surrounding breast biopsy site.  She is a diabetic.  She failed outpatient antibiotics.  She does not appear septic.  X-ray to assess for osteomyelitis.  IV vancomycin and cefepime ordered and admission.  She has an AKI.  She has no history of heart failure but along with some dyspnea and lower extremity edema increasing doses of diuretic will check a chest x-ray and proBNP.  She appears comfortable no respiratory distress at rest.  Admission.       FINAL CLINICAL IMPRESSION(S) / ED DIAGNOSES   Final diagnoses:  Cellulitis of lower extremity, unspecified laterality  AKI (acute kidney injury) (HCC)     Rx / DC Orders   ED Discharge Orders     None        Note:  This document was prepared using Dragon voice recognition software and may include unintentional dictation errors.Pilar Jarvis, MD 07/24/22 [phone]

## 2022-07-24 NOTE — Assessment & Plan Note (Signed)
Patient reports worsening dyspnea on exertion with prior history of pulmonary fibrosis.  Mild rales on examination.  No evidence of hypervolemia to suggest pulmonary edema and BNP is normal.  No cough, fever or other symptoms to suggest pneumonia.  Will obtain repeat imaging for further characterization.  - Continuous pulse oximetry - CT chest without contrast

## 2022-07-24 NOTE — Assessment & Plan Note (Signed)
Mild and chronic.  No petechiae on examination.

## 2022-07-24 NOTE — ED Notes (Signed)
Pt to CT

## 2022-07-24 NOTE — ED Triage Notes (Signed)
Pt sts that she has infection. Pt sts that her legs has been red and weaping for the last couple of weeks. Pt sts that her dermatologist gave her new cream but there are new places coming up every day.

## 2022-07-24 NOTE — Progress Notes (Signed)
PHARMACIST - PHYSICIAN COMMUNICATION  CONCERNING:  Enoxaparin (Lovenox) for DVT Prophylaxis    RECOMMENDATION: Patient was prescribed enoxaprin 40mg  q24 hours for VTE prophylaxis.   Filed Weights   07/24/22 1500  Weight: 81.6 kg (180 lb)    Body mass index is 29.05 kg/m.  Estimated Creatinine Clearance: 27 mL/min (A) (by C-G formula based on SCr of 1.7 mg/dL (H)).   Patient is candidate for enoxaparin 30mg  every 24 hours based on CrCl <69ml/min or Weight <45kg  DESCRIPTION: Pharmacy has adjusted enoxaparin dose per [facility] policy.  Patient is now receiving enoxaparin 30 mg every 24 hours    Elliot Gurney, PharmD, BCPS Clinical Pharmacist  07/24/2022 7:43 PM

## 2022-07-24 NOTE — Progress Notes (Signed)
Pharmacy Antibiotic Note  Courtney [name] is a 82 y.o. female admitted on 07/24/2022 with suspected foot infection-high risk for MRSA.  Pharmacy has been consulted for Vancomycin dosing.  Renal function significantly elevated from baseline.  Plan: Vancomycin 1,000 mg x 1 to complete a full 2,000 mg loading dose  Will dose by levels due to unstable renal function. Vancomycin level 24 hours post-dose.    Height: 5\' 6"  (167.6 cm) Weight: 81.6 kg (180 lb) IBW/kg (Calculated) : 59.3  Temp (24hrs), Avg:99 F (37.2 C), Min:99 F (37.2 C), Max:99 F (37.2 C)  Recent Labs  Lab 07/24/22 1502  WBC 8.7  CREATININE 1.70*    Estimated Creatinine Clearance: 27 mL/min (A) (by C-G formula based on SCr of 1.7 mg/dL (H)).    Allergies  Allergen Reactions   Methotrexate Derivatives Shortness Of Breath    Breathing difficulties    Acyclovir And Related Rash   Codeine Nausea Only   Contrast Media [Iodinated Contrast Media] Itching and Rash   Inderide [Propranolol-Hctz] Rash   Lodine [Etodolac] Rash   Metrizamide Itching and Rash   Procardia [Nifedipine] Rash    Antimicrobials this admission: ceftriaxone 7/15 >>  vancomycin 7/15 >>  Metronidazole 7/15 >>  Dose adjustments this admission: N/a  Microbiology results: 7/15 BCx: to be collected  Thank you for allowing pharmacy to be a part of this patient's care.  Elliot Gurney, PharmD, BCPS Clinical Pharmacist  07/24/2022 8:29 PM

## 2022-07-24 NOTE — ED Notes (Addendum)
Pt has been on antibiotic levaquin 8 days now; had trouble after taking other antibiotic for 3 days before that; states infection with toes on both feet; 1st and 2nd toe beside great toe on L foot red and oozing clear/wihte fluid; center is white/dark brown where pt report blister recently popped; great toe and 1st toe to R foot red. Pt reports most pain in RIGHT foot. Dorsalis pedis pulses 1+; feet warm. Wounds currently left OTA for EDP to assess. Pt denies fever at home but reports chills at times. Family at bedside. Pt reports inc SOB x3 weeks. PCP recently had her inc her fluid pill x2; 1+ pitting in both ankles currently. Pt denies excessive SOB while walking but states feels much more fatigued lately.

## 2022-07-24 NOTE — Assessment & Plan Note (Addendum)
Patient is presenting with approximately 3-week history of gradually progressing toe ulcers that are on the second and third digits.  Given bilateral nature, I am concerned for a thromboembolic etiology.  No prior history of A-fib, however will obtain an echocardiogram and keep on telemetry.  History of mild PAD, so will order ABIs and discuss a CTA with vascular surgery.  Additional differential includes Raynaud's.   - Telemetry monitoring - Echocardiogram to evaluate for any thrombus or atrial enlargement to suggest occult A-fib - ABIs - Vascular surgery consulted; appreciate their recommendations - Hold off on IV heparin at this time - Continue with ceftriaxone, flagyl and vancomycin given high risk for infection

## 2022-07-24 NOTE — Assessment & Plan Note (Signed)
-   Hold home olmesartan-HCTZ and spironolactone in the setting of AKI - Continue home bisoprolol

## 2022-07-24 NOTE — H&P (Signed)
History and Physical    Patient: Courtney [name]:[id number] DOB: [birth year] DOA: 07/24/2022 DOS: the patient was seen and examined on 07/24/2022 PCP: Danella Penton, MD  Patient coming from: Home  Chief Complaint:  Chief Complaint  Patient presents with   Wound Infection   HPI: Courtney [name] is a 82 y.o. female with medical history significant of rheumatoid arthritis on Remicade and leflunomide, psoriasis, hypertension, hyperlipidemia, type 2 diabetes, pulmonary fibrosis, sleep apnea, osteoarthritis, who presents to the ED due to left toe ulceration.  Courtney [name] states that approximately June 24, she noticed a small area of purple skin changes on her second left toe.  Then subsequently, the area developed a blister that turned black and then scabbed over.  Around 7/5, she noticed an additional area developing on her right second toe so she called her PCP who started her on Keflex.  She had a follow-up visit on 7/8 when she was switched from Keflex to Levaquin.  Since that visit, she has had additional blisters develop on her bilateral third toes.  She endorsed is approximately 3-week history of dyspnea on exertion but denies any chest pain or shortness of breath at rest.  She endorses bilateral lower extremity swelling that has improved with increasing dose of Lasix.  She denies any fever, chills, nausea, vomiting, diarrhea.  Per chart review, patient was seen by her PCP approximately 1 week ago for a new ulcer that appeared infected.  She was treated with Levaquin in addition to topical Bactroban.  ED course: On arrival to the ED, patient was normotensive at 136/49 with heart rate of 78.  She was saturating at 100% on room air.  She was afebrile at 99.  Initial workup notable for platelets of 148, WBC of 8.7, BUN 59, creatinine 1.70, glucose 164, albumin 3.2 and GFR of 30. Bilateral foot x-rays were obtained with no evidence of acute bony changes.  Chest x-ray was obtained with no active  disease.  Patient started on vancomycin and cefepime.  TRH contacted for admission.  Review of Systems: As mentioned in the history of present illness. All other systems reviewed and are negative.  Past Medical History:  Diagnosis Date   Arthritis    rheumatoid arthritis   B12 deficiency    Cancer (HCC)    skin cancer (squamous cell)   Collagen vascular disease (HCC)    RA   Dermatitis    Diabetes mellitus without complication (HCC)    type 2   Eczema    Fibrosis, pulmonary, interstitial, diffuse (HCC)    GERD (gastroesophageal reflux disease)    Headache    migraines in the past (none since menopause)   Heart murmur    History of hiatal hernia    Hives    "chronic"   Hypertension    Loose stools    Osteoarthritis    lumbar spine, cervical spine, plantar fascitis    Pneumonia    PONV (postoperative nausea and vomiting)    Psoriasis    Rosacea    Sleep apnea 2004   does not use cpap   Urethral stenosis    w/ bladder polyps. followed by Dr Leonette Monarch    Urticaria    chronic    Varicose veins    Past Surgical History:  Procedure Laterality Date   ANTERIOR CERVICAL DECOMP/DISCECTOMY FUSION N/A 08/20/2017   Procedure: Anterior Cervical Decompression Fusion - Cervical Four - Cervical Five - Cervical Five - Cervical Six;  Surgeon: Julio Sicks, MD;  Location: [facility] OR;  Service: Neurosurgery;  Laterality: N/A;  Anterior Cervical Decompression Fusion - Cervical Four - Cervical Five - Cervical Five - Cervical Six   APPENDECTOMY     BILATERAL CARPAL TUNNEL RELEASE     BREAST BIOPSY Left 04/24/2022   [place] Bx, 6:00 heart clip, SUBAREOLAR BREAST TISSUE WITH RUPTURED DUCT ECTASIA.   BREAST BIOPSY Left 04/24/2022   [place] bx, 4:00 coil clip, SUBAREOLAR BREAST TISSUE WITH AREAS OF STROMAL FIBROSIS.   BREAST BIOPSY Left 04/24/2022   [place] LT BREAST BX W LOC DEV 1ST LESION IMG BX SPEC US GUIDE 04/24/2022 [facility]   BREAST BIOPSY Left 04/24/2022   [place] LT BREAST BX W LOC DEV EA ADD LESION IMG  BX SPEC US GUIDE 04/24/2022 [facility]   BREAST BIOPSY WITH RADIO FREQUENCY LOCALIZER Left 06/19/2022   Procedure: BREAST BIOPSY WITH RADIO FREQUENCY LOCALIZER;  Surgeon: Carolan Shiver, MD;  Location: [facility] ORS;  Service: General;  Laterality: Left;   BREAST CYST ASPIRATION Left    neg   CATARACT EXTRACTION Bilateral    CHOLECYSTECTOMY  1963   COLONOSCOPY WITH PROPOFOL N/A 12/31/2015   Procedure: COLONOSCOPY WITH PROPOFOL;  Surgeon: Christena Deem, MD;  Location: [facility] ENDOSCOPY;  Service: Endoscopy;  Laterality: N/A;   ENDOMETRIAL ABLATION  1991   ESOPHAGOGASTRODUODENOSCOPY (EGD) WITH PROPOFOL N/A 12/31/2015   Procedure: ESOPHAGOGASTRODUODENOSCOPY (EGD) WITH PROPOFOL;  Surgeon: Christena Deem, MD;  Location: [facility] ENDOSCOPY;  Service: Endoscopy;  Laterality: N/A;   ESOPHAGOGASTRODUODENOSCOPY (EGD) WITH PROPOFOL N/A 06/02/2016   Procedure: ESOPHAGOGASTRODUODENOSCOPY (EGD) WITH PROPOFOL;  Surgeon: Christena Deem, MD;  Location: [facility] ENDOSCOPY;  Service: Endoscopy;  Laterality: N/A;   ESOPHAGOGASTRODUODENOSCOPY (EGD) WITH PROPOFOL N/A 11/07/2016   Procedure: ESOPHAGOGASTRODUODENOSCOPY (EGD) WITH PROPOFOL;  Surgeon: Christena Deem, MD;  Location: [facility] ENDOSCOPY;  Service: Endoscopy;  Laterality: N/A;   EYE SURGERY Bilateral 2006 and 2012   cataract surgery with lens implant   eyelid surgery Bilateral 2014   FOOT SURGERY Right    ligament and spurs   LUMBAR LAMINECTOMY/DECOMPRESSION MICRODISCECTOMY Right 12/24/2013   Procedure: RIGHT LUMBAR THREE TO FOUR, LUMBAR FOUR TO FIVE, LUMBAR FIVE TO SACRAL ONE LAMINECTOMY/FORAMINOTOMY;  Surgeon: Karn Cassis, MD;  Location: [facility] NEURO ORS;  Service: Neurosurgery;  Laterality: Right;  RIGHT L3-4 L4-5 L5-S1 LAMINECTOMY/FORAMINOTOMY   NASAL SEPTUM SURGERY  2004   SHOULDER SURGERY Right    for a frozen shoulder   SKIN CANCER EXCISION Right    leg x 4   skin cancer removal     TONSILLECTOMY     TUBAL LIGATION  1968   Social  History:  reports that she has never smoked. She has never used smokeless tobacco. She reports that she does not drink alcohol and does not use drugs.  Allergies  Allergen Reactions   Methotrexate Derivatives Shortness Of Breath    Breathing difficulties    Acyclovir And Related Rash   Codeine Nausea Only   Contrast Media [Iodinated Contrast Media] Itching and Rash   Inderide [Propranolol-Hctz] Rash   Lodine [Etodolac] Rash   Metrizamide Itching and Rash   Procardia [Nifedipine] Rash    Family History  Problem Relation Age of Onset   COPD Mother    Arthritis/Rheumatoid Mother    Alzheimer's disease Father    Heart attack Father    Breast cancer Paternal Aunt 61    Prior to Admission medications   Medication Sig Start Date End Date Taking? Authorizing Provider  acetaminophen (TYLENOL) 500 MG tablet Take 500 mg  by mouth every 6 (six) hours as needed (for pain.).     [provider]  aspirin EC 81 MG tablet Take 81 mg by mouth daily.    [provider]  bisoprolol (ZEBETA) 10 MG tablet Take 1 tablet (10 mg total) by mouth daily. 02/07/15   Ditty, Loura Halt, MD  cetirizine (ZYRTEC) 10 MG tablet Take 10 mg by mouth at bedtime.     [provider]  Cholecalciferol (D3) 25 MCG (1000 UT) capsule Take 1,000 Units by mouth daily.    [provider]  furosemide (LASIX) 20 MG tablet Take 20 mg by mouth as needed. 1 1/2 tablet    [provider]  gabapentin (NEURONTIN) 100 MG capsule Take 200 mg by mouth at bedtime.    [provider]  HYDROcodone-acetaminophen (NORCO) 10-325 MG tablet Take 1 tablet by mouth every 4 (four) hours as needed for moderate pain ((score 4 to 6)). Patient not taking: Reported on 06/19/2022 05/18/21   Julio Sicks, MD  inFLIXimab (REMICADE) 100 MG injection Inject 500 mg into the vein every 6 (six) weeks.    [provider]  insulin NPH Human (HUMULIN N,NOVOLIN N) 100 UNIT/ML injection Inject 0.4 mLs (40  Units total) into the skin daily before breakfast. Patient taking differently: Inject 25 Units into the skin in the morning and at bedtime. Am and bedtime 02/07/15   Ditty, Loura Halt, MD  leflunomide (ARAVA) 10 MG tablet Take 10 mg by mouth daily.    [provider]  levocetirizine (XYZAL) 5 MG tablet Take 5 mg by mouth at bedtime. Patient not taking: Reported on 12/09/2021    [provider]  montelukast (SINGULAIR) 10 MG tablet Take 10 mg by mouth daily.    [provider]  olmesartan-hydrochlorothiazide (BENICAR HCT) 40-12.5 MG tablet Take 1 tablet by mouth daily.    [provider]  pantoprazole (PROTONIX) 40 MG tablet Take 40 mg by mouth 2 (two) times daily.    [provider]  simvastatin (ZOCOR) 40 MG tablet Take 40 mg by mouth at bedtime.    [provider]  spironolactone (ALDACTONE) 25 MG tablet Take 25 mg by mouth daily.    [provider]  tiZANidine (ZANAFLEX) 4 MG tablet Take 4 mg by mouth 3 (three) times daily as needed for muscle spasms. 01/07/21   [provider]    Physical Exam: Vitals:   07/24/22 1815 07/24/22 1845 07/24/22 1854 07/24/22 1900  BP:   (!) 167/78   Pulse: 60   63  Resp: 16  15 18   Temp:      TempSrc:      SpO2: 100% 100%    Weight:      Height:       Physical Exam Vitals and nursing note reviewed.  Constitutional:      General: She is not in acute distress.    Appearance: She is normal weight. She is not toxic-appearing.  HENT:     Mouth/Throat:     Mouth: Mucous membranes are moist.     Pharynx: Oropharynx is clear.  Eyes:     Conjunctiva/sclera: Conjunctivae normal.     Pupils: Pupils are equal, round, and reactive to light.  Cardiovascular:     Rate and Rhythm: Normal rate and regular rhythm.     Heart sounds: Murmur (3/6 decrescendo systolic murmur) heard.     Comments: Unable to palpate DP pulses bilaterally Pulmonary:     Effort: Pulmonary effort is  normal. No  respiratory distress.     Breath sounds: Rales (Intermittent fine rales mostly in the bases) present. No wheezing.  Abdominal:     General: Bowel sounds are normal.     Palpations: Abdomen is soft.  Skin:    General: Skin is warm and dry.     Comments: Bilateral feet are cool to the touch.  Bilateral ulceration seen on the second digits; on the left second digit, it is located on the superior aspect with approximately half a centimeter scab.  On the right second digit, it is on the inferior aspect.  Bilateral third digits with blisters and surrounding erythema.  Please see pictures below.  No other areas of ulcers  Neurological:     Mental Status: She is alert and oriented to person, place, and time. Mental status is at baseline.  Psychiatric:        Mood and Affect: Mood normal.        Behavior: Behavior normal.           Data Reviewed: CBC with WBC of 8.7, hemoglobin of 12.7, platelets of 148 CMP with sodium of 137, potassium of 4.6, bicarb 22, glucose 164, BUN 59, creatinine 1.70, calcium 9.6, albumin 3.2, AST 21, ALT 14 and GFR of 30  DG Foot Complete Left  Result Date: 07/24/2022 CLINICAL DATA:  Ulceration and cellulitis. Query osteo. Bilateral infections in the toes not improving on antibiotics. EXAM: LEFT FOOT - COMPLETE 3+ VIEW COMPARISON:  None Available. FINDINGS: Diffuse bone demineralization. Degenerative changes in the interphalangeal joints, first metatarsal-phalangeal joint, and intertarsal joints. No focal bone erosion or bone sclerosis that would suggest evidence of osteomyelitis. Vascular calcifications in the soft tissues. Calcifications in the plantar soft tissues over the heel may represent evidence of plantar fasciitis or possibly phleboliths. IMPRESSION: Diffuse bone demineralization and degenerative changes. No acute bony abnormalities. Plantar soft tissue calcification may indicate plantar fasciitis. Electronically Signed   By: Burman Nieves M.D.   On:  07/24/2022 17:35   DG Foot Complete Right  Result Date: 07/24/2022 CLINICAL DATA:  Ulceration and cellulitis. Query osteo. Bilateral infections in the toes not improving on antibiotics. EXAM: RIGHT FOOT COMPLETE - 3+ VIEW COMPARISON:  None Available. FINDINGS: Diffuse bone demineralization. Degenerative changes in the interphalangeal, first metatarsal-phalangeal, and intertarsal joints. No evidence of acute fracture or dislocation. No focal bone sclerosis or bone erosion to suggest evidence of osteomyelitis. Mild soft tissue swelling on the dorsum of the foot. Vascular calcifications. IMPRESSION: Demineralization and degenerative changes. No acute bony abnormalities. Electronically Signed   By: Burman Nieves M.D.   On: 07/24/2022 17:33   DG Chest Port 1 View  Result Date: 07/24/2022 CLINICAL DATA:  Shortness of breath. EXAM: PORTABLE CHEST 1 VIEW COMPARISON:  Chest radiographs 09/02/2021 and chest CTA 09/03/2021 FINDINGS: The cardiomediastinal silhouette is unchanged with normal heart size. There is mild chronic accentuation of the interstitial markings. No overt pulmonary edema, airspace consolidation, sizeable pleural effusion, or pneumothorax is identified. Prior cervical spine fusion is noted. IMPRESSION: No active disease. Electronically Signed   By: Sebastian Ache M.D.   On: 07/24/2022 16:43    Results are pending, will review when available.  Assessment and Plan:  * Toe ulcer (HCC) Patient is presenting with approximately 3-week history of gradually progressing toe ulcers that are on the second and third digits.  Given bilateral nature, I am concerned for a thromboembolic etiology.  No prior history of A-fib, however will obtain an echocardiogram and keep  on telemetry.  History of mild PAD, so will order ABIs and discuss a CTA with vascular surgery.  Additional differential includes Raynaud's.   - Telemetry monitoring - Echocardiogram to evaluate for any thrombus or atrial enlargement to  suggest occult A-fib - ABIs - Vascular surgery consulted; appreciate their recommendations - Hold off on IV heparin at this time - Continue with ceftriaxone, flagyl and vancomycin given high risk for infection  AKI (acute kidney injury) (HCC) Likely due to increased Lasix dose over the past 1 week.  AKI likely due to overdiuresis.  - 500 cc LR bolus followed by maintenance fluids - Repeat BMP in the a.m. - Avoid nephrotoxic agents  Fibrosis, pulmonary, interstitial, diffuse (HCC) Patient reports worsening dyspnea on exertion with prior history of pulmonary fibrosis.  Mild rales on examination.  No evidence of hypervolemia to suggest pulmonary edema and BNP is normal.  No cough, fever or other symptoms to suggest pneumonia.  Will obtain repeat imaging for further characterization.  - Continuous pulse oximetry - CT chest without contrast  PAD (peripheral artery disease) (HCC) As noted above, repeating ABIs for further characterization.  Previously followed with vascular surgery with ABIs demonstrating noncompressible arteries however PAD was not considered significant.  Type 2 diabetes mellitus without complication, without long-term current use of insulin (HCC) - Hold home antiglycemic agents - SSI, moderate  Essential hypertension - Hold home olmesartan-HCTZ and spironolactone in the setting of AKI - Continue home bisoprolol  OSA on CPAP - CPAP at bedtime  Thrombocytopenia (HCC) Mild and chronic.  No petechiae on examination.  Rheumatoid arthritis, unspecified (HCC) Patient has a history of rheumatoid arthritis currently being managed with Remicade and leflunomide.  She notes that she has been on Remicade for nearly 10 years now.  Advance Care Planning:   Code Status: Full Code verified by patient.  She states she would not want to be on long-term life support, but is amenable to short-term resuscitative efforts.  Consults: None  Family Communication: Patin  Severity of  Illness: The appropriate patient status for this patient is OBSERVATION. Observation status is judged to be reasonable and necessary in order to provide the required intensity of service to ensure the patient's safety. The patient's presenting symptoms, physical exam findings, and initial radiographic and laboratory data in the context of their medical condition is felt to place them at decreased risk for further clinical deterioration. Furthermore, it is anticipated that the patient will be medically stable for discharge from the hospital within 2 midnights of admission.   Author: Verdene Lennert, MD 07/24/2022 8:21 PM  For on call review [email or web address].

## 2022-07-25 ENCOUNTER — Encounter
Admission: EM | Disposition: A | Discharge status: Home Health | Payer: Self-pay | Source: Home / Self Care | Attending: Internal Medicine

## 2022-07-25 ENCOUNTER — Observation Stay
Admit: 2022-07-25 | Discharge: 2022-07-25 | Disposition: A | Discharge status: Home / Self Care | Payer: Medicare Other | Attending: Internal Medicine | Admitting: Internal Medicine

## 2022-07-25 ENCOUNTER — Encounter: Payer: Self-pay | Admitting: Internal Medicine

## 2022-07-25 DIAGNOSIS — E1152 Type 2 diabetes mellitus with diabetic peripheral angiopathy with gangrene: Secondary | ICD-10-CM | POA: Diagnosis present

## 2022-07-25 DIAGNOSIS — J8410 Pulmonary fibrosis, unspecified: Secondary | ICD-10-CM | POA: Diagnosis present

## 2022-07-25 DIAGNOSIS — L03115 Cellulitis of right lower limb: Secondary | ICD-10-CM | POA: Diagnosis present

## 2022-07-25 DIAGNOSIS — I70249 Atherosclerosis of native arteries of left leg with ulceration of unspecified site: Secondary | ICD-10-CM | POA: Diagnosis not present

## 2022-07-25 DIAGNOSIS — L97919 Non-pressure chronic ulcer of unspecified part of right lower leg with unspecified severity: Secondary | ICD-10-CM

## 2022-07-25 DIAGNOSIS — N179 Acute kidney failure, unspecified: Secondary | ICD-10-CM | POA: Diagnosis present

## 2022-07-25 DIAGNOSIS — E11628 Type 2 diabetes mellitus with other skin complications: Secondary | ICD-10-CM | POA: Diagnosis not present

## 2022-07-25 DIAGNOSIS — Z981 Arthrodesis status: Secondary | ICD-10-CM | POA: Diagnosis not present

## 2022-07-25 DIAGNOSIS — L97929 Non-pressure chronic ulcer of unspecified part of left lower leg with unspecified severity: Secondary | ICD-10-CM | POA: Diagnosis not present

## 2022-07-25 DIAGNOSIS — Z794 Long term (current) use of insulin: Secondary | ICD-10-CM

## 2022-07-25 DIAGNOSIS — R52 Pain, unspecified: Secondary | ICD-10-CM | POA: Diagnosis not present

## 2022-07-25 DIAGNOSIS — T502X5A Adverse effect of carbonic-anhydrase inhibitors, benzothiadiazides and other diuretics, initial encounter: Secondary | ICD-10-CM | POA: Diagnosis present

## 2022-07-25 DIAGNOSIS — Z79620 Long term (current) use of immunosuppressive biologic: Secondary | ICD-10-CM | POA: Diagnosis not present

## 2022-07-25 DIAGNOSIS — M069 Rheumatoid arthritis, unspecified: Secondary | ICD-10-CM | POA: Diagnosis present

## 2022-07-25 DIAGNOSIS — L97521 Non-pressure chronic ulcer of other part of left foot limited to breakdown of skin: Secondary | ICD-10-CM | POA: Diagnosis not present

## 2022-07-25 DIAGNOSIS — I70263 Atherosclerosis of native arteries of extremities with gangrene, bilateral legs: Secondary | ICD-10-CM | POA: Diagnosis present

## 2022-07-25 DIAGNOSIS — I890 Lymphedema, not elsewhere classified: Secondary | ICD-10-CM | POA: Diagnosis present

## 2022-07-25 DIAGNOSIS — E538 Deficiency of other specified B group vitamins: Secondary | ICD-10-CM | POA: Diagnosis present

## 2022-07-25 DIAGNOSIS — I10 Essential (primary) hypertension: Secondary | ICD-10-CM | POA: Diagnosis present

## 2022-07-25 DIAGNOSIS — G4733 Obstructive sleep apnea (adult) (pediatric): Secondary | ICD-10-CM | POA: Diagnosis present

## 2022-07-25 DIAGNOSIS — M479 Spondylosis, unspecified: Secondary | ICD-10-CM | POA: Diagnosis present

## 2022-07-25 DIAGNOSIS — E119 Type 2 diabetes mellitus without complications: Secondary | ICD-10-CM | POA: Diagnosis not present

## 2022-07-25 DIAGNOSIS — L03116 Cellulitis of left lower limb: Secondary | ICD-10-CM | POA: Diagnosis present

## 2022-07-25 DIAGNOSIS — L309 Dermatitis, unspecified: Secondary | ICD-10-CM | POA: Diagnosis present

## 2022-07-25 DIAGNOSIS — I70239 Atherosclerosis of native arteries of right leg with ulceration of unspecified site: Secondary | ICD-10-CM

## 2022-07-25 DIAGNOSIS — L97529 Non-pressure chronic ulcer of other part of left foot with unspecified severity: Secondary | ICD-10-CM | POA: Diagnosis present

## 2022-07-25 DIAGNOSIS — R918 Other nonspecific abnormal finding of lung field: Secondary | ICD-10-CM

## 2022-07-25 DIAGNOSIS — L03119 Cellulitis of unspecified part of limb: Secondary | ICD-10-CM

## 2022-07-25 DIAGNOSIS — L97509 Non-pressure chronic ulcer of other part of unspecified foot with unspecified severity: Secondary | ICD-10-CM | POA: Diagnosis present

## 2022-07-25 DIAGNOSIS — Z7982 Long term (current) use of aspirin: Secondary | ICD-10-CM

## 2022-07-25 DIAGNOSIS — L409 Psoriasis, unspecified: Secondary | ICD-10-CM | POA: Diagnosis present

## 2022-07-25 DIAGNOSIS — E11621 Type 2 diabetes mellitus with foot ulcer: Secondary | ICD-10-CM | POA: Diagnosis present

## 2022-07-25 DIAGNOSIS — K219 Gastro-esophageal reflux disease without esophagitis: Secondary | ICD-10-CM | POA: Diagnosis present

## 2022-07-25 DIAGNOSIS — I700 Atherosclerosis of aorta: Secondary | ICD-10-CM

## 2022-07-25 DIAGNOSIS — L97519 Non-pressure chronic ulcer of other part of right foot with unspecified severity: Secondary | ICD-10-CM | POA: Diagnosis present

## 2022-07-25 DIAGNOSIS — Z9849 Cataract extraction status, unspecified eye: Secondary | ICD-10-CM | POA: Diagnosis not present

## 2022-07-25 DIAGNOSIS — Z792 Long term (current) use of antibiotics: Secondary | ICD-10-CM | POA: Diagnosis not present

## 2022-07-25 DIAGNOSIS — E785 Hyperlipidemia, unspecified: Secondary | ICD-10-CM | POA: Diagnosis present

## 2022-07-25 DIAGNOSIS — D696 Thrombocytopenia, unspecified: Secondary | ICD-10-CM | POA: Diagnosis present

## 2022-07-25 HISTORY — PX: ABDOMINAL AORTOGRAM W/LOWER EXTREMITY: CATH118223

## 2022-07-25 LAB — BASIC METABOLIC PANEL
Anion gap: 9 (ref 5–15)
BUN: 58 mg/dL — ABNORMAL HIGH (ref 8–23)
CO2: 23 mmol/L (ref 22–32)
Calcium: 9.4 mg/dL (ref 8.9–10.3)
Chloride: 105 mmol/L (ref 98–111)
Creatinine, Ser: 1.54 mg/dL — ABNORMAL HIGH (ref 0.44–1.00)
GFR, Estimated: 33 mL/min — ABNORMAL LOW (ref >60)
Glucose, Bld: 198 mg/dL — ABNORMAL HIGH (ref 70–99)
Potassium: 3.7 mmol/L (ref 3.5–5.1)
Sodium: 137 mmol/L (ref 135–145)

## 2022-07-25 LAB — CBC WITH DIFFERENTIAL/PLATELET
Abs Immature Granulocytes: 0.04 10*3/uL (ref 0.00–0.07)
Basophils Absolute: 0 10*3/uL (ref 0.0–0.1)
Basophils Relative: 1 %
Eosinophils Absolute: 0.3 10*3/uL (ref 0.0–0.5)
Eosinophils Relative: 4 %
HCT: 36 % (ref 36.0–46.0)
Hemoglobin: 12 g/dL (ref 12.0–15.0)
Immature Granulocytes: 1 %
Lymphocytes Relative: 41 %
Lymphs Abs: 2.7 10*3/uL (ref 0.7–4.0)
MCH: 28.9 pg (ref 26.0–34.0)
MCHC: 33.3 g/dL (ref 30.0–36.0)
MCV: 86.7 fL (ref 80.0–100.0)
Monocytes Absolute: 0.7 10*3/uL (ref 0.1–1.0)
Monocytes Relative: 10 %
Neutro Abs: 2.8 10*3/uL (ref 1.7–7.7)
Neutrophils Relative %: 43 %
Platelets: 120 10*3/uL — ABNORMAL LOW (ref 150–400)
RBC: 4.15 MIL/uL (ref 3.87–5.11)
RDW: 13.4 % (ref 11.5–15.5)
WBC: 6.5 10*3/uL (ref 4.0–10.5)
nRBC: 0 % (ref 0.0–0.2)

## 2022-07-25 LAB — GLUCOSE, CAPILLARY
Glucose-Capillary: 136 mg/dL — ABNORMAL HIGH (ref 70–99)
Glucose-Capillary: 153 mg/dL — ABNORMAL HIGH (ref 70–99)
Glucose-Capillary: 171 mg/dL — ABNORMAL HIGH (ref 70–99)
Glucose-Capillary: 383 mg/dL — ABNORMAL HIGH (ref 70–99)

## 2022-07-25 LAB — MRSA NEXT GEN BY PCR, NASAL: MRSA by PCR Next Gen: NOT DETECTED

## 2022-07-25 SURGERY — ABDOMINAL AORTOGRAM W/LOWER EXTREMITY
Anesthesia: Moderate Sedation | Laterality: Left

## 2022-07-25 MED ORDER — ACETAMINOPHEN 325 MG PO TABS: 650.0000 mg | ORAL_TABLET | ORAL | Status: DC | PRN

## 2022-07-25 MED ORDER — SODIUM CHLORIDE 0.9 % IV SOLN: INTRAVENOUS | Status: AC

## 2022-07-25 MED ORDER — SODIUM CHLORIDE 0.9 % IV SOLN: INTRAVENOUS | Status: DC

## 2022-07-25 MED ORDER — CEFAZOLIN SODIUM-DEXTROSE 2-4 GM/100ML-% IV SOLN
2.0000 g | INTRAVENOUS | Status: AC
  Administered 2022-07-25: 2 g via INTRAVENOUS

## 2022-07-25 MED ORDER — MORPHINE SULFATE (PF) 4 MG/ML IV SOLN: 2.0000 mg | INTRAVENOUS | Status: DC | PRN

## 2022-07-25 MED ORDER — DIPHENHYDRAMINE HCL 50 MG/ML IJ SOLN: 50.0000 mg | Freq: Once | INTRAMUSCULAR | Status: AC | PRN

## 2022-07-25 MED ORDER — DIPHENHYDRAMINE HCL 50 MG/ML IJ SOLN
INTRAMUSCULAR | Status: AC
  Administered 2022-07-25: 50 mg via INTRAVENOUS
  Filled 2022-07-25: qty 1

## 2022-07-25 MED ORDER — HEPARIN SODIUM (PORCINE) 1000 UNIT/ML IJ SOLN
INTRAMUSCULAR | Status: AC
  Filled 2022-07-25: qty 10

## 2022-07-25 MED ORDER — METHYLPREDNISOLONE SODIUM SUCC 125 MG IJ SOLR
INTRAMUSCULAR | Status: AC
  Administered 2022-07-25: 125 mg via INTRAVENOUS
  Filled 2022-07-25: qty 2

## 2022-07-25 MED ORDER — ONDANSETRON HCL 4 MG/2ML IJ SOLN: 4.0000 mg | Freq: Four times a day (QID) | INTRAMUSCULAR | Status: DC | PRN

## 2022-07-25 MED ORDER — ORAL CARE MOUTH RINSE: 15.0000 mL | OROMUCOSAL | Status: DC | PRN

## 2022-07-25 MED ORDER — GABAPENTIN 100 MG PO CAPS
200.0000 mg | ORAL_CAPSULE | Freq: Every day | ORAL | Status: DC
  Administered 2022-07-26: 200 mg via ORAL
  Filled 2022-07-25: qty 2

## 2022-07-25 MED ORDER — MIDAZOLAM HCL 2 MG/ML PO SYRP: 8.0000 mg | ORAL_SOLUTION | Freq: Once | ORAL | Status: DC | PRN

## 2022-07-25 MED ORDER — SODIUM CHLORIDE 0.9 % IV SOLN: 250.0000 mL | INTRAVENOUS | Status: DC | PRN

## 2022-07-25 MED ORDER — CEFAZOLIN SODIUM-DEXTROSE 2-4 GM/100ML-% IV SOLN
INTRAVENOUS | Status: AC
  Filled 2022-07-25: qty 100

## 2022-07-25 MED ORDER — FENTANYL CITRATE (PF) 100 MCG/2ML IJ SOLN
INTRAMUSCULAR | Status: DC | PRN
  Administered 2022-07-25: 25 ug via INTRAVENOUS
  Administered 2022-07-25: 50 ug via INTRAVENOUS

## 2022-07-25 MED ORDER — MIDAZOLAM HCL 2 MG/2ML IJ SOLN
INTRAMUSCULAR | Status: DC | PRN
  Administered 2022-07-25: .5 mg via INTRAVENOUS
  Administered 2022-07-25: 1 mg via INTRAVENOUS

## 2022-07-25 MED ORDER — FAMOTIDINE 20 MG PO TABS: 40.0000 mg | ORAL_TABLET | Freq: Once | ORAL | Status: DC | PRN

## 2022-07-25 MED ORDER — FENTANYL CITRATE PF 50 MCG/ML IJ SOSY: 12.5000 ug | PREFILLED_SYRINGE | Freq: Once | INTRAMUSCULAR | Status: DC | PRN

## 2022-07-25 MED ORDER — MIDAZOLAM HCL 2 MG/2ML IJ SOLN
INTRAMUSCULAR | Status: AC
  Filled 2022-07-25: qty 2

## 2022-07-25 MED ORDER — METHYLPREDNISOLONE SODIUM SUCC 125 MG IJ SOLR: 125.0000 mg | Freq: Once | INTRAMUSCULAR | Status: AC | PRN

## 2022-07-25 MED ORDER — HEPARIN (PORCINE) IN NACL 1000-0.9 UT/500ML-% IV SOLN
INTRAVENOUS | Status: DC | PRN
  Administered 2022-07-25: 1000 mL

## 2022-07-25 MED ORDER — HYDROMORPHONE HCL 1 MG/ML IJ SOLN: 1.0000 mg | Freq: Once | INTRAMUSCULAR | Status: DC | PRN

## 2022-07-25 MED ORDER — OXYCODONE HCL 5 MG PO TABS: 5.0000 mg | ORAL_TABLET | ORAL | Status: DC | PRN

## 2022-07-25 MED ORDER — SODIUM CHLORIDE 0.9% FLUSH
3.0000 mL | Freq: Two times a day (BID) | INTRAVENOUS | Status: DC
  Administered 2022-07-26: 3 mL via INTRAVENOUS

## 2022-07-25 MED ORDER — IODIXANOL 320 MG/ML IV SOLN
INTRAVENOUS | Status: DC | PRN
  Administered 2022-07-25: 40 mL

## 2022-07-25 MED ORDER — LIDOCAINE HCL (PF) 1 % IJ SOLN
INTRAMUSCULAR | Status: DC | PRN
  Administered 2022-07-25: 10 mL

## 2022-07-25 MED ORDER — SODIUM CHLORIDE 0.9% FLUSH: 3.0000 mL | INTRAVENOUS | Status: DC | PRN

## 2022-07-25 MED ORDER — FENTANYL CITRATE (PF) 100 MCG/2ML IJ SOLN
INTRAMUSCULAR | Status: AC
  Filled 2022-07-25: qty 2

## 2022-07-25 SURGICAL SUPPLY — 13 items
CATH ANGIO 5F PIGTAIL 65CM (CATHETERS) IMPLANT
CATH TEMPO 5F RIM 65CM (CATHETERS) IMPLANT
COVER PROBE ULTRASOUND 5X96 (MISCELLANEOUS) IMPLANT
DEVICE STARCLOSE SE CLOSURE (Vascular Products) IMPLANT
GLIDEWIRE ADV .035X260CM (WIRE) IMPLANT
NDL ENTRY 21GA 7CM ECHOTIP (NEEDLE) IMPLANT
NEEDLE ENTRY 21GA 7CM ECHOTIP (NEEDLE) ×1 IMPLANT
PACK ANGIOGRAPHY (CUSTOM PROCEDURE TRAY) IMPLANT
SET INTRO CAPELLA COAXIAL (SET/KITS/TRAYS/PACK) IMPLANT
SHEATH BRITE TIP 5FRX11 (SHEATH) IMPLANT
SYR MEDRAD MARK 7 150ML (SYRINGE) IMPLANT
TUBING CONTRAST HIGH PRESS 72 (TUBING) IMPLANT
WIRE GUIDERIGHT .035X150 (WIRE) IMPLANT

## 2022-07-25 NOTE — Consult Note (Signed)
Hospital Consult    Reason for Consult:  Bilateral Lower Extremity toe ulcers. Requesting Physician:  Dr Imagene Gurney MD MRN #:  [id number]  History of Present Illness: This is a 82 y.o. female Courtney [name] is a 82 y.o. female with medical history significant of rheumatoid arthritis on Remicade and leflunomide, psoriasis, hypertension, hyperlipidemia, type 2 diabetes, pulmonary fibrosis, sleep apnea, osteoarthritis, who presents to the ED due to left toe ulceration.   On exam this morning patient was resting comfortably in bed having an echocardiogram. Patients husband is at the bedside. She endorses pain to both feet arising from her toes. She states the sore on her second toe on her left foot started about a month a go and was very small. She tried many over the counter treatments with failure. Vascular Surgery was consulted today to evaluate the patients vascular flow to her feet.   Past Medical History:  Diagnosis Date   Arthritis    rheumatoid arthritis   B12 deficiency    Cancer (HCC)    skin cancer (squamous cell)   Collagen vascular disease (HCC)    RA   Dermatitis    Diabetes mellitus without complication (HCC)    type 2   Eczema    Fibrosis, pulmonary, interstitial, diffuse (HCC)    GERD (gastroesophageal reflux disease)    Headache    migraines in the past (none since menopause)   Heart murmur    History of hiatal hernia    Hives    "chronic"   Hypertension    Loose stools    Osteoarthritis    lumbar spine, cervical spine, plantar fascitis    Pneumonia    PONV (postoperative nausea and vomiting)    Psoriasis    Rosacea    Sleep apnea 2004   does not use cpap   Urethral stenosis    w/ bladder polyps. followed by Dr Leonette Monarch    Urticaria    chronic    Varicose veins     Past Surgical History:  Procedure Laterality Date   ANTERIOR CERVICAL DECOMP/DISCECTOMY FUSION N/A 08/20/2017   Procedure: Anterior Cervical Decompression Fusion - Cervical Four -  Cervical Five - Cervical Five - Cervical Six;  Surgeon: Julio Sicks, MD;  Location: [facility] OR;  Service: Neurosurgery;  Laterality: N/A;  Anterior Cervical Decompression Fusion - Cervical Four - Cervical Five - Cervical Five - Cervical Six   APPENDECTOMY     BILATERAL CARPAL TUNNEL RELEASE     BREAST BIOPSY Left 04/24/2022   [place] Bx, 6:00 heart clip, SUBAREOLAR BREAST TISSUE WITH RUPTURED DUCT ECTASIA.   BREAST BIOPSY Left 04/24/2022   [place] bx, 4:00 coil clip, SUBAREOLAR BREAST TISSUE WITH AREAS OF STROMAL FIBROSIS.   BREAST BIOPSY Left 04/24/2022   [place] LT BREAST BX W LOC DEV 1ST LESION IMG BX SPEC US GUIDE 04/24/2022 [facility]   BREAST BIOPSY Left 04/24/2022   [place] LT BREAST BX W LOC DEV EA ADD LESION IMG BX SPEC US GUIDE 04/24/2022 [facility]   BREAST BIOPSY WITH RADIO FREQUENCY LOCALIZER Left 06/19/2022   Procedure: BREAST BIOPSY WITH RADIO FREQUENCY LOCALIZER;  Surgeon: Carolan Shiver, MD;  Location: [facility] ORS;  Service: General;  Laterality: Left;   BREAST CYST ASPIRATION Left    neg   CATARACT EXTRACTION Bilateral    CHOLECYSTECTOMY  1963   COLONOSCOPY WITH PROPOFOL N/A 12/31/2015   Procedure: COLONOSCOPY WITH PROPOFOL;  Surgeon: Christena Deem, MD;  Location: [facility] ENDOSCOPY;  Service: Endoscopy;  Laterality: N/A;  ENDOMETRIAL ABLATION  1991   ESOPHAGOGASTRODUODENOSCOPY (EGD) WITH PROPOFOL N/A 12/31/2015   Procedure: ESOPHAGOGASTRODUODENOSCOPY (EGD) WITH PROPOFOL;  Surgeon: Christena Deem, MD;  Location: [facility] ENDOSCOPY;  Service: Endoscopy;  Laterality: N/A;   ESOPHAGOGASTRODUODENOSCOPY (EGD) WITH PROPOFOL N/A 06/02/2016   Procedure: ESOPHAGOGASTRODUODENOSCOPY (EGD) WITH PROPOFOL;  Surgeon: Christena Deem, MD;  Location[facility];  Service: Endoscopy;  Laterality: N/A;   ESOPHAGOGASTRODUODENOSCOPY (EGD) WITH PROPOFOL N/A 11/07/2016   Procedure: ESOPHAGOGASTRODUODENOSCOPY (EGD) WITH PROPOFOL;  Surgeon: Christena Deem, MD;  Location: [facility] ENDOSCOPY;  Service:  Endoscopy;  Laterality: N/A;   EYE SURGERY Bilateral 2006 and 2012   cataract surgery with lens implant   eyelid surgery Bilateral 2014   FOOT SURGERY Right    ligament and spurs   LUMBAR LAMINECTOMY/DECOMPRESSION MICRODISCECTOMY Right 12/24/2013   Procedure: RIGHT LUMBAR THREE TO FOUR, LUMBAR FOUR TO FIVE, LUMBAR FIVE TO SACRAL ONE LAMINECTOMY/FORAMINOTOMY;  Surgeon: Karn Cassis, MD;  Location: [facility] NEURO ORS;  Service: Neurosurgery;  Laterality: Right;  RIGHT L3-4 L4-5 L5-S1 LAMINECTOMY/FORAMINOTOMY   NASAL SEPTUM SURGERY  2004   SHOULDER SURGERY Right    for a frozen shoulder   SKIN CANCER EXCISION Right    leg x 4   skin cancer removal     TONSILLECTOMY     TUBAL LIGATION  1968    Allergies  Allergen Reactions   Methotrexate Derivatives Shortness Of Breath    Breathing difficulties    Acyclovir And Related Rash   Codeine Nausea Only   Contrast Media [Iodinated Contrast Media] Itching and Rash   Inderide [Propranolol-Hctz] Rash   Lodine [Etodolac] Rash   Metrizamide Itching and Rash   Procardia [Nifedipine] Rash    Prior to Admission medications   Medication Sig Start Date End Date Taking? Authorizing Provider  acetaminophen (TYLENOL) 500 MG tablet Take 500 mg by mouth every 6 (six) hours as needed (for pain.).    Yes [provider]  aspirin EC 81 MG tablet Take 81 mg by mouth daily.   Yes [provider]  bisoprolol (ZEBETA) 10 MG tablet Take 1 tablet (10 mg total) by mouth daily. 02/07/15  Yes Ditty, Loura Halt, MD  cetirizine (ZYRTEC) 10 MG tablet Take 10 mg by mouth at bedtime.    Yes [provider]  Cholecalciferol (D3) 25 MCG (1000 UT) capsule Take 1,000 Units by mouth daily.   Yes [provider]  famotidine (PEPCID) 40 MG tablet Take 40 mg by mouth daily.   Yes [provider]  gabapentin (NEURONTIN) 100 MG capsule Take 100 mg by mouth 2 (two) times daily.   Yes [provider]  insulin NPH Human (HUMULIN  N,NOVOLIN N) 100 UNIT/ML injection Inject 0.4 mLs (40 Units total) into the skin daily before breakfast. Patient taking differently: Inject 25 Units into the skin in the morning and at bedtime. 02/07/15  Yes Ditty, Loura Halt, MD  leflunomide (ARAVA) 10 MG tablet Take 10 mg by mouth daily.   Yes [provider]  levofloxacin (LEVAQUIN) 500 MG tablet Take 500 mg by mouth daily. 07/17/22 07/27/22 Yes [provider]  montelukast (SINGULAIR) 10 MG tablet Take 10 mg by mouth daily.   Yes [provider]  olmesartan-hydrochlorothiazide (BENICAR HCT) 40-12.5 MG tablet Take 1 tablet by mouth daily.   Yes [provider]  pantoprazole (PROTONIX) 40 MG tablet Take 40 mg by mouth 2 (two) times daily.   Yes [provider]  simvastatin (ZOCOR) 40 MG tablet Take 40 mg  by mouth at bedtime.   Yes [provider]  spironolactone (ALDACTONE) 25 MG tablet Take 25 mg by mouth daily.   Yes [provider]  HYDROcodone-acetaminophen (NORCO) 10-325 MG tablet Take 1 tablet by mouth every 4 (four) hours as needed for moderate pain ((score 4 to 6)). Patient not taking: Reported on 06/19/2022 05/18/21   Julio Sicks, MD    Social History   Socioeconomic History   Marital status: Married    Spouse name: glenn   Number of children: Not on file   Years of education: Not on file   Highest education level: Not on file  Occupational History   Not on file  Tobacco Use   Smoking status: Never   Smokeless tobacco: Never  Vaping Use   Vaping status: Never Used  Substance and Sexual Activity   Alcohol use: No   Drug use: No   Sexual activity: Not on file  Other Topics Concern   Not on file  Social History Narrative   Not on file   Social Determinants of Health   Financial Resource Strain: Not on file  Food Insecurity: No Food Insecurity (07/25/2022)   Hunger Vital Sign    Worried About Running Out of Food in the Last Year: Never true    Ran Out of  Food in the Last Year: Never true  Transportation Needs: No Transportation Needs (07/25/2022)   PRAPARE - Administrator, Civil Service (Medical): No    Lack of Transportation (Non-Medical): No  Physical Activity: Not on file  Stress: Not on file  Social Connections: Not on file  Intimate Partner Violence: Not At Risk (07/25/2022)   Humiliation, Afraid, Rape, and Kick questionnaire    Fear of Current or Ex-Partner: No    Emotionally Abused: No    Physically Abused: No    Sexually Abused: No     Family History  Problem Relation Age of Onset   COPD Mother    Arthritis/Rheumatoid Mother    Alzheimer's disease Father    Heart attack Father    Breast cancer Paternal Aunt 50    ROS: Otherwise negative unless mentioned in HPI  Physical Examination  Vitals:   07/25/22 0013 07/25/22 0748  BP: (!) 125/43 (!) 128/52  Pulse: 74 69  Resp: 16 14  Temp: 97.7 F (36.5 C) (!) 97.3 F (36.3 C)  SpO2: 97% 100%   Body mass index is 29.05 kg/m.  General:  WDWN in NAD Gait: Not observed HENT: WNL, normocephalic Pulmonary: normal non-labored breathing, without Rales, rhonchi,  wheezing Cardiac: regular, without  Murmurs, rubs or gallops; without carotid bruits Abdomen: Positive bowel Sounds, soft, NT/ND, no masses Skin: without rashes Vascular Exam/Pulses: Positive doppler pulses to bilateral lower extremities with strong DP pulses but weak PT pulses.  Extremities: with ischemic changes, with Gangrene , with cellulitis; with open wounds;  Musculoskeletal: no muscle wasting or atrophy  Neurologic: A&O X 3;  No focal weakness or paresthesias are detected; speech is fluent/normal Psychiatric:  The pt has Normal affect. Lymph:  Unremarkable  CBC    Component Value Date/Time   WBC 6.5 07/25/2022 0035   RBC 4.15 07/25/2022 0035   HGB 12.0 07/25/2022 0035   HCT 36.0 07/25/2022 0035   PLT 120 (L) 07/25/2022 0035   MCV 86.7 07/25/2022 0035   MCH 28.9 07/25/2022 0035   MCHC  33.3 07/25/2022 0035   RDW 13.4 07/25/2022 0035   LYMPHSABS 2.7 07/25/2022 0035   MONOABS 0.7  07/25/2022 0035   EOSABS 0.3 07/25/2022 0035   BASOSABS 0.0 07/25/2022 0035    BMET    Component Value Date/Time   NA 137 07/25/2022 0035   K 3.7 07/25/2022 0035   CL 105 07/25/2022 0035   CO2 23 07/25/2022 0035   GLUCOSE 198 (H) 07/25/2022 0035   BUN 58 (H) 07/25/2022 0035   CREATININE 1.54 (H) 07/25/2022 0035   CALCIUM 9.4 07/25/2022 0035   GFRNONAA 33 (L) 07/25/2022 0035   GFRAA >60 08/13/2017 1430    COAGS: Lab Results  Component Value Date   INR 1.22 12/25/2014     Non-Invasive Vascular Imaging:   No CTA ordered due to elevated BUN and Creatine. Recent is 58/1.54  Statin:  Yes.   Beta Blocker:  No. Aspirin:  Yes.   ACEI:  No. ARB:  No. CCB use:  No Other antiplatelets/anticoagulants:  No.    ASSESSMENT/PLAN: This is a 82 y.o. female who presents to [facility] ER due to pain and left second toe ulceration. Work up did not include a CTA due to elevated bun and creatinine. Patient complains of resting pain to bilateral feet.   PLAN: Vascular Surgery plans on taking the patient to the vascular lab this afternoon for an Aortogram with left lower extremity angiogram with possible intervention. I discussed in detail with the patient and her husband at the bedside the procedure, benefits, risks and complications. Both the patient and her husband verbalize there understanding and wish to proceed as soon as possible. I answered all the patients and husbands questions today. Patient took one bite of breakfast this morning and has been NPO since that time at 7:30 am.     -I discussed in detail the plan with Dr Vilinda Flake MD and he agrees with the plan.    Courtney [name] [facility] 07/25/2022 8:34 AM

## 2022-07-25 NOTE — Op Note (Signed)
[facility]  Percutaneous Study/Intervention Procedural Note   Date of Surgery: 07/25/2022,4:56 PM  Surgeon:Landin Tallon, Latina Craver   Pre-operative Diagnosis: Atherosclerotic occlusive disease bilateral lower extremities with multiple ulcerations bilateral lower extremities with cellulitis of the left foot.  Post-operative diagnosis:  Same  Procedure(s) Performed:  1.  Abdominal aortogram  2.  Selective injection of the left lower extremity third order catheter placement  3.  Ultrasound-guided access to the right common femoral artery  4.  StarClose right femoral artery    Anesthesia: Conscious sedation was administered by the interventional radiology RN under my direct supervision. IV Versed plus fentanyl were utilized. Continuous ECG, pulse oximetry and blood pressure was monitored throughout the entire procedure.  Conscious sedation was administered for a total of 32 minutes.  Sheath: 5 French 11 cm Pinnacle sheath retrograde right common femoral  Contrast: 40 cc   Fluoroscopy Time: 2.9 minutes  Indications:  The patient presents to [facility] with atherosclerotic occlusive disease bilateral lower extremities with cellulitis of the left foot associated with multiple ulcerations bilaterally.  Pedal pulses are nonpalpable bilaterally suggesting hemodynamically significant atherosclerotic occlusive disease.  Previous ABIs also demonstrate dampened signals at the level of the ankles suggesting significant atherosclerotic occlusive disease.  The risks and benefits as well as alternative therapies for lower extremity revascularization are reviewed with the patient all questions are answered the patient agrees to proceed.  The patient is therefore undergoing angiography with the hope for intervention for limb salvage.   Procedure:  Courtney [name] a 82 y.o. female who was identified and appropriate procedural time out was performed.  The patient was then placed  supine on the table and prepped and draped in the usual sterile fashion.  Ultrasound was used to evaluate the right common femoral artery.  It was echolucent and pulsatile indicating it is patent .  An ultrasound image was acquired for the permanent record.  A micropuncture needle was used to access the right common femoral artery under direct ultrasound guidance.  The microwire was then advanced under fluoroscopic guidance without difficulty followed by the micro-sheath.  A 0.035 J wire was advanced without resistance and a 5Fr sheath was placed.    Pigtail catheter was then advanced to the level of T12 and AP projection of the aorta was obtained. Pigtail catheter was then repositioned to above the bifurcation and RAO view of the pelvis was obtained. Stiff angled Glidewire and rim catheter was then used across the bifurcation and the rim catheter is exchanged for the pigtail catheter which was positioned in the distal left external iliac artery.  LAO of the left groin was then obtained. Wire was reintroduced and the catheter wire combination negotiated into the SFA. Distal runoff was then performed.  After review of the images the catheter was removed over wire and an RAO view of the groin was obtained. StarClose device was deployed without difficulty.   Findings:   Aortogram: The abdominal aorta is opacified with a bolus injection contrast.  Single renal arteries are noted bilaterally with normal nephrograms.  No evidence of hemodynamically significant renal artery stenosis.  There are no hemodynamically significant stenoses identified within the aorta.  The aortic bifurcation is mildly diseased but widely patent.  Bilateral common internal and external iliac arteries are free of hemodynamically significant lesions.  Left lower Extremity: The left common femoral, profunda femoris superficial femoral and popliteal arteries demonstrate mild atherosclerotic changes but there are no hemodynamically  significant lesions.  Trifurcation is  patent the tibioperoneal trunk is heavily diseased, there is occlusion of the posterior tibial throughout its course.  The peroneal is also occluded.  There is single vessel runoff to the foot anterior tibial which is patent and of good caliber filling the dorsalis pedis and the pedal arch although the pedal arch does appear discontinuous and imaging of the fourth and fifth toes is quite poor.  SUMMARY: Based on these images no intervention is performed at this time.    Disposition: Patient was taken to the recovery room in stable condition having tolerated the procedure well.  [name] 07/25/2022,4:56 PM

## 2022-07-25 NOTE — Plan of Care (Signed)

## 2022-07-25 NOTE — Progress Notes (Addendum)
  Progress Note   Patient: Courtney [name] JWJ:[id number] DOB: 06-09-1940 DOA: 07/24/2022     0 DOS: the patient was seen and examined on 07/25/2022   Brief hospital course: 82 y.o. female Courtney [name] is a 82 y.o. female with medical history significant of rheumatoid arthritis on Remicade and leflunomide, psoriasis, hypertension, hyperlipidemia, type 2 diabetes, pulmonary fibrosis, sleep apnea, osteoarthritis, who presents to the ED due to left toe ulceration.  Patient had left second toe several months ago and has been gradually worsening.  She has right second toe redness.  Patient is admitted to hospitalist service for further management, IV antibiotics and vascular evaluation.   Assessment and Plan: Left second toe ulcer (HCC) Patient noticed changes in skin of left second toe and right second toe since 3 weeks. Vascular evaluation appreciated.  She will go for angiogram today. Unable to get CTA due to kidney dysfunction. Echocardiogram pending. Continue telemetry monitoring. Patient will be continued on ceftriaxone, Flagyl.  AKI (acute kidney injury) (HCC) Likely due to overdiuresis.  Patient got LR bolus Repeat BMP with improvement. Avoid nephrotoxic agents. Continue to monitor daily renal function, electrolytes.  Fibrosis, pulmonary, interstitial, diffuse (HCC) CT chest shows multiple lower lobe nodules measuring 1.2cm. She was on methotrexate which is stopped due to pulmonary nodules. Will get pulmonary evaluation. Continuous pulse oximetry. Oxygen saturation   Type 2 diabetes mellitus without complication, without long-term current use of insulin (HCC) Hold home antiglycemic agents Continue accucheks, sliding scale insulin.  Essential hypertension Hold olmesartan-HCTZ and spironolactone due to AKI Continue home bisoprolol  OSA on CPAP CPAP at bedtime  Thrombocytopenia (HCC) Mild and chronic.  No petechiae on examination.  Rheumatoid arthritis, unspecified  (HCC) Patient has a history of rheumatoid arthritis  Continue Remicade and leflunomide.        Subjective: Patient is seen and examined today morning.  She is lying comfortably.  She does have left second toe ulcer, redness for a month now.  Denies any fever or chills. Husband at bedside.  Physical Exam: Vitals:   07/24/22 2100 07/24/22 2130 07/25/22 0013 07/25/22 0748  BP: (!) 178/131 (!) 172/91 (!) 125/43 (!) 128/52  Pulse: (!) 44 71 74 69  Resp: 11 17 16 14   Temp:   97.7 F (36.5 C) (!) 97.3 F (36.3 C)  TempSrc:      SpO2: (!) 89% 99% 97% 100%  Weight:      Height:       General - Elderly Caucasian female, no apparent distress HEENT - PERRLA, EOMI, atraumatic head, non tender sinuses. Lung - Clear, rales, rhonchi, wheezes. Heart - S1, S2 heard, no murmurs, rubs, trace pedal edema Neuro - Alert, awake and oriented x 3, non focal exam. Skin - Warm and dry. Left second toe ulcer, redness noted. Right second toe redness.       Data Reviewed:  CBC, BMP, blood sugars.   Family Communication: Patient and husband at bedside updated regarding the current plan.  Disposition: Status is: Inpatient Remains inpatient appropriate because: Vascular intervention, IV antibiotics  Planned Discharge Destination: Home    Time spent: 45 minutes  Author: Marcelino Duster, MD 07/25/2022 1:57 PM  For on call review [email or web address].

## 2022-07-25 NOTE — Progress Notes (Signed)
Pharmacy Antibiotic Note  Courtney [name] is a 82 y.o. female w/ PMH of RA, HTN, HLD, DM, pulmonary fibrosis admitted on 07/24/2022 with suspected foot infection-high risk for MRSA.  Pharmacy has been consulted for vancomycin dosing.  Renal function significantly elevated from baseline, although SCr improving  Plan: s/p vancomycin  2,000 mg loading dose ---Will dose by levels due to unstable renal function.  ---vancomycin level ~24 hours post-dose.   Height: 5\' 6"  (167.6 cm) Weight: 81.6 kg (180 lb) IBW/kg (Calculated) : 59.3  Temp (24hrs), Avg:98.4 F (36.9 C), Min:97.7 F (36.5 C), Max:99 F (37.2 C)  Recent Labs  Lab 07/24/22 1502 07/25/22 0035  WBC 8.7 6.5  CREATININE 1.70* 1.54*    Estimated Creatinine Clearance: 29.8 mL/min (A) (by C-G formula based on SCr of 1.54 mg/dL (H)).    Allergies  Allergen Reactions   Methotrexate Derivatives Shortness Of Breath    Breathing difficulties    Acyclovir And Related Rash   Codeine Nausea Only   Contrast Media [Iodinated Contrast Media] Itching and Rash   Inderide [Propranolol-Hctz] Rash   Lodine [Etodolac] Rash   Metrizamide Itching and Rash   Procardia [Nifedipine] Rash    Antimicrobials this admission: ceftriaxone 7/15 >>  vancomycin 7/15 >>  metronidazole 7/15 >>  Microbiology results: 7/15 BCx: NGTD 7/16 MRSA PCR: negative  Thank you for allowing pharmacy to be a part of this patient's care.  Burnis Medin, PharmD, BCPS Clinical Pharmacist  07/25/2022 7:03 AM

## 2022-07-25 NOTE — H&P (View-Only) (Signed)
Hospital Consult    Reason for Consult:  Bilateral Lower Extremity toe ulcers. Requesting Physician:  Dr Imagene Gurney MD MRN #:  [id number]  History of Present Illness: This is a 82 y.o. female LAURENCE FOLZ is a 82 y.o. female with medical history significant of rheumatoid arthritis on Remicade and leflunomide, psoriasis, hypertension, hyperlipidemia, type 2 diabetes, pulmonary fibrosis, sleep apnea, osteoarthritis, who presents to the ED due to left toe ulceration.   On exam this morning patient was resting comfortably in bed having an echocardiogram. Patients husband is at the bedside. She endorses pain to both feet arising from her toes. She states the sore on her second toe on her left foot started about a month a go and was very small. She tried many over the counter treatments with failure. Vascular Surgery was consulted today to evaluate the patients vascular flow to her feet.   Past Medical History:  Diagnosis Date   Arthritis    rheumatoid arthritis   B12 deficiency    Cancer (HCC)    skin cancer (squamous cell)   Collagen vascular disease (HCC)    RA   Dermatitis    Diabetes mellitus without complication (HCC)    type 2   Eczema    Fibrosis, pulmonary, interstitial, diffuse (HCC)    GERD (gastroesophageal reflux disease)    Headache    migraines in the past (none since menopause)   Heart murmur    History of hiatal hernia    Hives    "chronic"   Hypertension    Loose stools    Osteoarthritis    lumbar spine, cervical spine, plantar fascitis    Pneumonia    PONV (postoperative nausea and vomiting)    Psoriasis    Rosacea    Sleep apnea 2004   does not use cpap   Urethral stenosis    w/ bladder polyps. followed by Dr Leonette Monarch    Urticaria    chronic    Varicose veins     Past Surgical History:  Procedure Laterality Date   ANTERIOR CERVICAL DECOMP/DISCECTOMY FUSION N/A 08/20/2017   Procedure: Anterior Cervical Decompression Fusion - Cervical Four -  Cervical Five - Cervical Five - Cervical Six;  Surgeon: Julio Sicks, MD;  Location: [facility] OR;  Service: Neurosurgery;  Laterality: N/A;  Anterior Cervical Decompression Fusion - Cervical Four - Cervical Five - Cervical Five - Cervical Six   APPENDECTOMY     BILATERAL CARPAL TUNNEL RELEASE     BREAST BIOPSY Left 04/24/2022   [place] Bx, 6:00 heart clip, SUBAREOLAR BREAST TISSUE WITH RUPTURED DUCT ECTASIA.   BREAST BIOPSY Left 04/24/2022   [place] bx, 4:00 coil clip, SUBAREOLAR BREAST TISSUE WITH AREAS OF STROMAL FIBROSIS.   BREAST BIOPSY Left 04/24/2022   [place] LT BREAST BX W LOC DEV 1ST LESION IMG BX SPEC US GUIDE 04/24/2022 [facility]   BREAST BIOPSY Left 04/24/2022   [place] LT BREAST BX W LOC DEV EA ADD LESION IMG BX SPEC US GUIDE 04/24/2022 [facility]   BREAST BIOPSY WITH RADIO FREQUENCY LOCALIZER Left 06/19/2022   Procedure: BREAST BIOPSY WITH RADIO FREQUENCY LOCALIZER;  Surgeon: Carolan Shiver, MD;  Location: [facility] ORS;  Service: General;  Laterality: Left;   BREAST CYST ASPIRATION Left    neg   CATARACT EXTRACTION Bilateral    CHOLECYSTECTOMY  1963   COLONOSCOPY WITH PROPOFOL N/A 12/31/2015   Procedure: COLONOSCOPY WITH PROPOFOL;  Surgeon: Christena Deem, MD;  Location: [facility] ENDOSCOPY;  Service: Endoscopy;  Laterality: N/A;  ENDOMETRIAL ABLATION  1991   ESOPHAGOGASTRODUODENOSCOPY (EGD) WITH PROPOFOL N/A 12/31/2015   Procedure: ESOPHAGOGASTRODUODENOSCOPY (EGD) WITH PROPOFOL;  Surgeon: Christena Deem, MD;  Location: [facility] ENDOSCOPY;  Service: Endoscopy;  Laterality: N/A;   ESOPHAGOGASTRODUODENOSCOPY (EGD) WITH PROPOFOL N/A 06/02/2016   Procedure: ESOPHAGOGASTRODUODENOSCOPY (EGD) WITH PROPOFOL;  Surgeon: Christena Deem, MD;  Location[facility];  Service: Endoscopy;  Laterality: N/A;   ESOPHAGOGASTRODUODENOSCOPY (EGD) WITH PROPOFOL N/A 11/07/2016   Procedure: ESOPHAGOGASTRODUODENOSCOPY (EGD) WITH PROPOFOL;  Surgeon: Christena Deem, MD;  Location: [facility] ENDOSCOPY;  Service:  Endoscopy;  Laterality: N/A;   EYE SURGERY Bilateral 2006 and 2012   cataract surgery with lens implant   eyelid surgery Bilateral 2014   FOOT SURGERY Right    ligament and spurs   LUMBAR LAMINECTOMY/DECOMPRESSION MICRODISCECTOMY Right 12/24/2013   Procedure: RIGHT LUMBAR THREE TO FOUR, LUMBAR FOUR TO FIVE, LUMBAR FIVE TO SACRAL ONE LAMINECTOMY/FORAMINOTOMY;  Surgeon: Karn Cassis, MD;  Location: [facility] NEURO ORS;  Service: Neurosurgery;  Laterality: Right;  RIGHT L3-4 L4-5 L5-S1 LAMINECTOMY/FORAMINOTOMY   NASAL SEPTUM SURGERY  2004   SHOULDER SURGERY Right    for a frozen shoulder   SKIN CANCER EXCISION Right    leg x 4   skin cancer removal     TONSILLECTOMY     TUBAL LIGATION  1968    Allergies  Allergen Reactions   Methotrexate Derivatives Shortness Of Breath    Breathing difficulties    Acyclovir And Related Rash   Codeine Nausea Only   Contrast Media [Iodinated Contrast Media] Itching and Rash   Inderide [Propranolol-Hctz] Rash   Lodine [Etodolac] Rash   Metrizamide Itching and Rash   Procardia [Nifedipine] Rash    Prior to Admission medications   Medication Sig Start Date End Date Taking? Authorizing Provider  acetaminophen (TYLENOL) 500 MG tablet Take 500 mg by mouth every 6 (six) hours as needed (for pain.).    Yes [provider]  aspirin EC 81 MG tablet Take 81 mg by mouth daily.   Yes [provider]  bisoprolol (ZEBETA) 10 MG tablet Take 1 tablet (10 mg total) by mouth daily. 02/07/15  Yes Ditty, Loura Halt, MD  cetirizine (ZYRTEC) 10 MG tablet Take 10 mg by mouth at bedtime.    Yes [provider]  Cholecalciferol (D3) 25 MCG (1000 UT) capsule Take 1,000 Units by mouth daily.   Yes [provider]  famotidine (PEPCID) 40 MG tablet Take 40 mg by mouth daily.   Yes [provider]  gabapentin (NEURONTIN) 100 MG capsule Take 100 mg by mouth 2 (two) times daily.   Yes [provider]  insulin NPH Human (HUMULIN  N,NOVOLIN N) 100 UNIT/ML injection Inject 0.4 mLs (40 Units total) into the skin daily before breakfast. Patient taking differently: Inject 25 Units into the skin in the morning and at bedtime. 02/07/15  Yes Ditty, Loura Halt, MD  leflunomide (ARAVA) 10 MG tablet Take 10 mg by mouth daily.   Yes [provider]  levofloxacin (LEVAQUIN) 500 MG tablet Take 500 mg by mouth daily. 07/17/22 07/27/22 Yes [provider]  montelukast (SINGULAIR) 10 MG tablet Take 10 mg by mouth daily.   Yes [provider]  olmesartan-hydrochlorothiazide (BENICAR HCT) 40-12.5 MG tablet Take 1 tablet by mouth daily.   Yes [provider]  pantoprazole (PROTONIX) 40 MG tablet Take 40 mg by mouth 2 (two) times daily.   Yes [provider]  simvastatin (ZOCOR) 40 MG tablet Take 40 mg  by mouth at bedtime.   Yes [provider]  spironolactone (ALDACTONE) 25 MG tablet Take 25 mg by mouth daily.   Yes [provider]  HYDROcodone-acetaminophen (NORCO) 10-325 MG tablet Take 1 tablet by mouth every 4 (four) hours as needed for moderate pain ((score 4 to 6)). Patient not taking: Reported on 06/19/2022 05/18/21   Julio Sicks, MD    Social History   Socioeconomic History   Marital status: Married    Spouse name: glenn   Number of children: Not on file   Years of education: Not on file   Highest education level: Not on file  Occupational History   Not on file  Tobacco Use   Smoking status: Never   Smokeless tobacco: Never  Vaping Use   Vaping status: Never Used  Substance and Sexual Activity   Alcohol use: No   Drug use: No   Sexual activity: Not on file  Other Topics Concern   Not on file  Social History Narrative   Not on file   Social Determinants of Health   Financial Resource Strain: Not on file  Food Insecurity: No Food Insecurity (07/25/2022)   Hunger Vital Sign    Worried About Running Out of Food in the Last Year: Never true    Ran Out of  Food in the Last Year: Never true  Transportation Needs: No Transportation Needs (07/25/2022)   PRAPARE - Administrator, Civil Service (Medical): No    Lack of Transportation (Non-Medical): No  Physical Activity: Not on file  Stress: Not on file  Social Connections: Not on file  Intimate Partner Violence: Not At Risk (07/25/2022)   Humiliation, Afraid, Rape, and Kick questionnaire    Fear of Current or Ex-Partner: No    Emotionally Abused: No    Physically Abused: No    Sexually Abused: No     Family History  Problem Relation Age of Onset   COPD Mother    Arthritis/Rheumatoid Mother    Alzheimer's disease Father    Heart attack Father    Breast cancer Paternal Aunt 50    ROS: Otherwise negative unless mentioned in HPI  Physical Examination  Vitals:   07/25/22 0013 07/25/22 0748  BP: (!) 125/43 (!) 128/52  Pulse: 74 69  Resp: 16 14  Temp: 97.7 F (36.5 C) (!) 97.3 F (36.3 C)  SpO2: 97% 100%   Body mass index is 29.05 kg/m.  General:  WDWN in NAD Gait: Not observed HENT: WNL, normocephalic Pulmonary: normal non-labored breathing, without Rales, rhonchi,  wheezing Cardiac: regular, without  Murmurs, rubs or gallops; without carotid bruits Abdomen: Positive bowel Sounds, soft, NT/ND, no masses Skin: without rashes Vascular Exam/Pulses: Positive doppler pulses to bilateral lower extremities with strong DP pulses but weak PT pulses.  Extremities: with ischemic changes, with Gangrene , with cellulitis; with open wounds;  Musculoskeletal: no muscle wasting or atrophy  Neurologic: A&O X 3;  No focal weakness or paresthesias are detected; speech is fluent/normal Psychiatric:  The pt has Normal affect. Lymph:  Unremarkable  CBC    Component Value Date/Time   WBC 6.5 07/25/2022 0035   RBC 4.15 07/25/2022 0035   HGB 12.0 07/25/2022 0035   HCT 36.0 07/25/2022 0035   PLT 120 (L) 07/25/2022 0035   MCV 86.7 07/25/2022 0035   MCH 28.9 07/25/2022 0035   MCHC  33.3 07/25/2022 0035   RDW 13.4 07/25/2022 0035   LYMPHSABS 2.7 07/25/2022 0035   MONOABS 0.7  07/25/2022 0035   EOSABS 0.3 07/25/2022 0035   BASOSABS 0.0 07/25/2022 0035    BMET    Component Value Date/Time   NA 137 07/25/2022 0035   K 3.7 07/25/2022 0035   CL 105 07/25/2022 0035   CO2 23 07/25/2022 0035   GLUCOSE 198 (H) 07/25/2022 0035   BUN 58 (H) 07/25/2022 0035   CREATININE 1.54 (H) 07/25/2022 0035   CALCIUM 9.4 07/25/2022 0035   GFRNONAA 33 (L) 07/25/2022 0035   GFRAA >60 08/13/2017 1430    COAGS: Lab Results  Component Value Date   INR 1.22 12/25/2014     Non-Invasive Vascular Imaging:   No CTA ordered due to elevated BUN and Creatine. Recent is 58/1.54  Statin:  Yes.   Beta Blocker:  No. Aspirin:  Yes.   ACEI:  No. ARB:  No. CCB use:  No Other antiplatelets/anticoagulants:  No.    ASSESSMENT/PLAN: This is a 82 y.o. female who presents to [facility] ER due to pain and left second toe ulceration. Work up did not include a CTA due to elevated bun and creatinine. Patient complains of resting pain to bilateral feet.   PLAN: Vascular Surgery plans on taking the patient to the vascular lab this afternoon for an Aortogram with left lower extremity angiogram with possible intervention. I discussed in detail with the patient and her husband at the bedside the procedure, benefits, risks and complications. Both the patient and her husband verbalize there understanding and wish to proceed as soon as possible. I answered all the patients and husbands questions today. Patient took one bite of breakfast this morning and has been NPO since that time at 7:30 am.     -I discussed in detail the plan with Dr Vilinda Flake MD and he agrees with the plan.    Marcie Bal [facility] 07/25/2022 8:34 AM

## 2022-07-25 NOTE — Interval H&P Note (Signed)
History and Physical Interval Note:  07/25/2022 3:47 PM  Courtney [name]  has presented today for surgery, with the diagnosis of PAD.  The various methods of treatment have been discussed with the patient and family. After consideration of risks, benefits and other options for treatment, the patient has consented to  Procedure(s): ABDOMINAL AORTOGRAM W/LOWER EXTREMITY (Left) as a surgical intervention.  The patient's history has been reviewed, patient examined, no change in status, stable for surgery.  I have reviewed the patient's chart and labs.  Questions were answered to the patient's satisfaction.     [name]

## 2022-07-25 NOTE — Progress Notes (Signed)
*  PRELIMINARY RESULTS* Echocardiogram 2D Echocardiogram has been performed.  Courtney [name] 07/25/2022, 9:33 AM

## 2022-07-25 NOTE — TOC Progression Note (Addendum)
Transition of Care [facility]) - Progression Note    Patient Details  Name: Courtney [name] MRN: [id number] Date of Birth: [birth year]  [facility] CM/SW Contact  Marlowe Sax, RN Phone Number: 07/25/2022, 1:26 PM  Clinical Narrative:    The patient comes from home with her Husband, ascular Surgery plans on taking the patient to the vascular lab this afternoon for an Aortogram with left lower extremity angiogram with possible intervention , TOC to follow and assist with DC planning and needs   Expected Discharge Plan:  (TBD) Barriers to Discharge: Continued Medical Work up  Expected Discharge Plan and Services   Discharge Planning Services: CM Consult                                           Social Determinants of Health (SDOH) Interventions SDOH Screenings   Food Insecurity: No Food Insecurity (07/25/2022)  Housing: Low Risk  (07/25/2022)  Transportation Needs: No Transportation Needs (07/25/2022)  Utilities: Not At Risk (07/25/2022)  Tobacco Use: Low Risk  (07/25/2022)    Readmission Risk Interventions     No data to display

## 2022-07-25 NOTE — Progress Notes (Signed)
Pt reports that she has lost about 12 lbs in the past 2-3 months and has had a lost of appetite due to hospitalizations and illnesses.  During admission assessment, it caused her malnutrition score to be a 2 triggering the auto- populated order for Ensure.  Pt states that she is pleased with her weight loss, although it was not intentional and does not want Ensure or any other supplements ordered.  She adds that her primary doctor is also pleased with her weight loss.

## 2022-07-26 ENCOUNTER — Inpatient Hospital Stay: Payer: Medicare Other

## 2022-07-26 ENCOUNTER — Encounter: Payer: Self-pay | Admitting: Vascular Surgery

## 2022-07-26 DIAGNOSIS — L03119 Cellulitis of unspecified part of limb: Secondary | ICD-10-CM

## 2022-07-26 DIAGNOSIS — E11628 Type 2 diabetes mellitus with other skin complications: Secondary | ICD-10-CM | POA: Diagnosis not present

## 2022-07-26 LAB — BASIC METABOLIC PANEL
Anion gap: 11 (ref 5–15)
BUN: 42 mg/dL — ABNORMAL HIGH (ref 8–23)
CO2: 20 mmol/L — ABNORMAL LOW (ref 22–32)
Calcium: 9.3 mg/dL (ref 8.9–10.3)
Chloride: 105 mmol/L (ref 98–111)
Creatinine, Ser: 1.38 mg/dL — ABNORMAL HIGH (ref 0.44–1.00)
GFR, Estimated: 38 mL/min — ABNORMAL LOW (ref >60)
Glucose, Bld: 378 mg/dL — ABNORMAL HIGH (ref 70–99)
Potassium: 3.7 mmol/L (ref 3.5–5.1)
Sodium: 136 mmol/L (ref 135–145)

## 2022-07-26 LAB — VANCOMYCIN, RANDOM: Vancomycin Rm: 9 ug/mL

## 2022-07-26 LAB — GLUCOSE, CAPILLARY
Glucose-Capillary: 353 mg/dL — ABNORMAL HIGH (ref 70–99)
Glucose-Capillary: 366 mg/dL — ABNORMAL HIGH (ref 70–99)
Glucose-Capillary: 271 mg/dL — ABNORMAL HIGH (ref 70–99)
Glucose-Capillary: 246 mg/dL — ABNORMAL HIGH (ref 70–99)

## 2022-07-26 LAB — CREATININE, SERUM
Creatinine, Ser: 1.32 mg/dL — ABNORMAL HIGH (ref 0.44–1.00)
GFR, Estimated: 40 mL/min — ABNORMAL LOW (ref >60)

## 2022-07-26 MED ORDER — JUVEN PO PACK
1.0000 | PACK | Freq: Two times a day (BID) | ORAL | Status: DC
  Administered 2022-07-27 (×2): 1 via ORAL

## 2022-07-26 MED ORDER — INSULIN ASPART 100 UNIT/ML IJ SOLN
0.0000 [IU] | Freq: Every day | INTRAMUSCULAR | Status: DC
  Administered 2022-07-26: 2 [IU] via SUBCUTANEOUS
  Filled 2022-07-26: qty 1

## 2022-07-26 MED ORDER — VITAMIN C 500 MG PO TABS
500.0000 mg | ORAL_TABLET | Freq: Two times a day (BID) | ORAL | Status: DC
  Administered 2022-07-26 – 2022-07-27 (×2): 500 mg via ORAL
  Filled 2022-07-26 (×2): qty 1

## 2022-07-26 MED ORDER — ADULT MULTIVITAMIN W/MINERALS CH
1.0000 | ORAL_TABLET | Freq: Every day | ORAL | Status: DC
  Administered 2022-07-26 – 2022-07-27 (×2): 1 via ORAL
  Filled 2022-07-26 (×2): qty 1

## 2022-07-26 MED ORDER — INSULIN ASPART 100 UNIT/ML IJ SOLN
0.0000 [IU] | Freq: Three times a day (TID) | INTRAMUSCULAR | Status: DC
  Administered 2022-07-26 (×2): 9 [IU] via SUBCUTANEOUS
  Administered 2022-07-26: 5 [IU] via SUBCUTANEOUS
  Administered 2022-07-27 (×2): 2 [IU] via SUBCUTANEOUS
  Filled 2022-07-26 (×4): qty 1

## 2022-07-26 MED ORDER — ENOXAPARIN SODIUM 40 MG/0.4ML IJ SOSY
40.0000 mg | PREFILLED_SYRINGE | INTRAMUSCULAR | Status: DC
  Administered 2022-07-26: 40 mg via SUBCUTANEOUS
  Filled 2022-07-26: qty 0.4

## 2022-07-26 MED ORDER — INSULIN GLARGINE-YFGN 100 UNIT/ML ~~LOC~~ SOLN
15.0000 [IU] | Freq: Every day | SUBCUTANEOUS | Status: DC
  Administered 2022-07-26: 15 [IU] via SUBCUTANEOUS
  Filled 2022-07-26 (×2): qty 0.15

## 2022-07-26 MED ORDER — ZINC SULFATE 220 (50 ZN) MG PO CAPS
220.0000 mg | ORAL_CAPSULE | Freq: Every day | ORAL | Status: DC
  Administered 2022-07-26 – 2022-07-27 (×2): 220 mg via ORAL
  Filled 2022-07-26 (×2): qty 1

## 2022-07-26 MED ORDER — BACITRACIN 500 UNIT/GM EX OINT
TOPICAL_OINTMENT | Freq: Two times a day (BID) | CUTANEOUS | Status: DC
  Filled 2022-07-26: qty 28.4

## 2022-07-26 MED ORDER — MELATONIN 5 MG PO TABS
10.0000 mg | ORAL_TABLET | Freq: Every day | ORAL | Status: DC
  Administered 2022-07-26: 10 mg via ORAL
  Filled 2022-07-26: qty 2

## 2022-07-26 NOTE — Inpatient Diabetes Management (Signed)
Inpatient Diabetes Program Recommendations  AACE/ADA: New Consensus Statement on Inpatient Glycemic Control (2015)  Target Ranges:  Prepandial:   less than 140 mg/dL      Peak postprandial:   less than 180 mg/dL (1-2 hours)      Critically ill patients:  140 - 180 mg/dL   Lab Results  Component Value Date   GLUCAP 366 (H) 07/26/2022   HGBA1C 6.2 (H) 05/17/2021    Review of Glycemic Control  Latest Reference Range & Units 07/26/22 07:35 07/26/22 11:22  Glucose-Capillary 70 - 99 mg/dL 161 (H) 096 (H)  (H): Data is abnormally high  Diabetes history: DM2 Outpatient Diabetes medications:  NPH 25 units BID (total of 35 units of basal insulin) Current orders for Inpatient glycemic control:  Semglee 15 units every day Novolog 0-9 units TID and 0-5 units at bedtime Received Solumedrol 125 mg yesterday   Inpatient Diabetes Program Recommendations:    Please consider:  1) Semglee 20 units at bedtime 2) Novolog 0-15 units TID   Will continue to follow while inpatient.  Thank you, Dulce Sellar, MSN, CDCES Diabetes Coordinator Inpatient Diabetes Program [phone] (team pager from 8a-5p)

## 2022-07-26 NOTE — Plan of Care (Signed)

## 2022-07-26 NOTE — Consult Note (Signed)
ORTHOPAEDIC CONSULTATION  REQUESTING PHYSICIAN: Loyce Dys, MD  Chief Complaint: Bilateral second toe ulcerations  HPI: Courtney [name] is a 82 y.o. female who complains of worsening redness and ulcerations to second toes.  Was seen by vascular surgery.  Underwent angiogram to the left lower extremity.  There was noted to be extensive disease.  No angioplasty or stent placed at that time.  Concern for his diffuse erythema to the second toe.  At this point  the discoloration has improved from yesterday.  She also has a sore on the distal aspect of her right second toe.  Finally a small blood blister on her left third toe.  Past Medical History:  Diagnosis Date   Arthritis    rheumatoid arthritis   B12 deficiency    Cancer (HCC)    skin cancer (squamous cell)   Collagen vascular disease (HCC)    RA   Dermatitis    Diabetes mellitus without complication (HCC)    type 2   Eczema    Fibrosis, pulmonary, interstitial, diffuse (HCC)    GERD (gastroesophageal reflux disease)    Headache    migraines in the past (none since menopause)   Heart murmur    History of hiatal hernia    Hives    "chronic"   Hypertension    Loose stools    Osteoarthritis    lumbar spine, cervical spine, plantar fascitis    Pneumonia    PONV (postoperative nausea and vomiting)    Psoriasis    Rosacea    Sleep apnea 2004   does not use cpap   Urethral stenosis    w/ bladder polyps. followed by Dr Leonette Monarch    Urticaria    chronic    Varicose veins    Past Surgical History:  Procedure Laterality Date   ABDOMINAL AORTOGRAM W/LOWER EXTREMITY Left 07/25/2022   Procedure: ABDOMINAL AORTOGRAM W/LOWER EXTREMITY;  Surgeon: Renford Dills, MD;  Location: [facility]ASIVE [facility];  Service: Cardiovascular;  Laterality: Left;   ANTERIOR CERVICAL DECOMP/DISCECTOMY FUSION N/A 08/20/2017   Procedure: Anterior Cervical Decompression Fusion - Cervical Four - Cervical Five - Cervical Five - Cervical Six;  Surgeon:  Julio Sicks, MD;  Location: [facility] OR;  Service: Neurosurgery;  Laterality: N/A;  Anterior Cervical Decompression Fusion - Cervical Four - Cervical Five - Cervical Five - Cervical Six   APPENDECTOMY     BILATERAL CARPAL TUNNEL RELEASE     BREAST BIOPSY Left 04/24/2022   [place] Bx, 6:00 heart clip, SUBAREOLAR BREAST TISSUE WITH RUPTURED DUCT ECTASIA.   BREAST BIOPSY Left 04/24/2022   [place] bx, 4:00 coil clip, SUBAREOLAR BREAST TISSUE WITH AREAS OF STROMAL FIBROSIS.   BREAST BIOPSY Left 04/24/2022   [place] LT BREAST BX W LOC DEV 1ST LESION IMG BX SPEC US GUIDE 04/24/2022 [facility]   BREAST BIOPSY Left 04/24/2022   [place] LT BREAST BX W LOC DEV EA ADD LESION IMG BX SPEC US GUIDE 04/24/2022 [facility]   BREAST BIOPSY WITH RADIO FREQUENCY LOCALIZER Left 06/19/2022   Procedure: BREAST BIOPSY WITH RADIO FREQUENCY LOCALIZER;  Surgeon: Carolan Shiver, MD;  Location: [facility] ORS;  Service: General;  Laterality: Left;   BREAST CYST ASPIRATION Left    neg   CATARACT EXTRACTION Bilateral    CHOLECYSTECTOMY  1963   COLONOSCOPY WITH PROPOFOL N/A 12/31/2015   Procedure: COLONOSCOPY WITH PROPOFOL;  Surgeon: Christena Deem, MD;  Location: [facility] ENDOSCOPY;  Service: Endoscopy;  Laterality: N/A;   ENDOMETRIAL ABLATION  1991  ESOPHAGOGASTRODUODENOSCOPY (EGD) WITH PROPOFOL N/A 12/31/2015   Procedure: ESOPHAGOGASTRODUODENOSCOPY (EGD) WITH PROPOFOL;  Surgeon: Christena Deem, MD;  Location: [facility] ENDOSCOPY;  Service: Endoscopy;  Laterality: N/A;   ESOPHAGOGASTRODUODENOSCOPY (EGD) WITH PROPOFOL N/A 06/02/2016   Procedure: ESOPHAGOGASTRODUODENOSCOPY (EGD) WITH PROPOFOL;  Surgeon: Christena Deem, MD;  Location: [facility] ENDOSCOPY;  Service: Endoscopy;  Laterality: N/A;   ESOPHAGOGASTRODUODENOSCOPY (EGD) WITH PROPOFOL N/A 11/07/2016   Procedure: ESOPHAGOGASTRODUODENOSCOPY (EGD) WITH PROPOFOL;  Surgeon: Christena Deem, MD;  Location: [facility] ENDOSCOPY;  Service: Endoscopy;  Laterality: N/A;   EYE SURGERY Bilateral 2006  and 2012   cataract surgery with lens implant   eyelid surgery Bilateral 2014   FOOT SURGERY Right    ligament and spurs   LUMBAR LAMINECTOMY/DECOMPRESSION MICRODISCECTOMY Right 12/24/2013   Procedure: RIGHT LUMBAR THREE TO FOUR, LUMBAR FOUR TO FIVE, LUMBAR FIVE TO SACRAL ONE LAMINECTOMY/FORAMINOTOMY;  Surgeon: Karn Cassis, MD;  Location: [facility] NEURO ORS;  Service: Neurosurgery;  Laterality: Right;  RIGHT L3-4 L4-5 L5-S1 LAMINECTOMY/FORAMINOTOMY   NASAL SEPTUM SURGERY  2004   SHOULDER SURGERY Right    for a frozen shoulder   SKIN CANCER EXCISION Right    leg x 4   skin cancer removal     TONSILLECTOMY     TUBAL LIGATION  1968   Social History   Socioeconomic History   Marital status: Married    Spouse name: glenn   Number of children: Not on file   Years of education: Not on file   Highest education level: Not on file  Occupational History   Not on file  Tobacco Use   Smoking status: Never   Smokeless tobacco: Never  Vaping Use   Vaping status: Never Used  Substance and Sexual Activity   Alcohol use: No   Drug use: No   Sexual activity: Not on file  Other Topics Concern   Not on file  Social History Narrative   Not on file   Social Determinants of Health   Financial Resource Strain: Not on file  Food Insecurity: No Food Insecurity (07/25/2022)   Hunger Vital Sign    Worried About Running Out of Food in the Last Year: Never true    Ran Out of Food in the Last Year: Never true  Transportation Needs: No Transportation Needs (07/25/2022)   PRAPARE - Administrator, Civil Service (Medical): No    Lack of Transportation (Non-Medical): No  Physical Activity: Not on file  Stress: Not on file  Social Connections: Not on file   Family History  Problem Relation Age of Onset   COPD Mother    Arthritis/Rheumatoid Mother    Alzheimer's disease Father    Heart attack Father    Breast cancer Paternal Aunt 35   Allergies  Allergen Reactions   Methotrexate  Derivatives Shortness Of Breath    Breathing difficulties    Acyclovir And Related Rash   Codeine Nausea Only   Contrast Media [Iodinated Contrast Media] Itching and Rash   Inderide [Propranolol-Hctz] Rash   Lodine [Etodolac] Rash   Metrizamide Itching and Rash   Procardia [Nifedipine] Rash   Prior to Admission medications   Medication Sig Start Date End Date Taking? Authorizing Provider  acetaminophen (TYLENOL) 500 MG tablet Take 500 mg by mouth every 6 (six) hours as needed (for pain.).    Yes [provider]  aspirin EC 81 MG tablet Take 81 mg by mouth daily.   Yes [provider]  bisoprolol (ZEBETA) 10 MG  tablet Take 1 tablet (10 mg total) by mouth daily. 02/07/15  Yes Ditty, Loura Halt, MD  cetirizine (ZYRTEC) 10 MG tablet Take 10 mg by mouth at bedtime.    Yes [provider]  Cholecalciferol (D3) 25 MCG (1000 UT) capsule Take 1,000 Units by mouth daily.   Yes [provider]  famotidine (PEPCID) 40 MG tablet Take 40 mg by mouth daily.   Yes [provider]  gabapentin (NEURONTIN) 100 MG capsule Take 100 mg by mouth 2 (two) times daily.   Yes [provider]  insulin NPH Human (HUMULIN N,NOVOLIN N) 100 UNIT/ML injection Inject 0.4 mLs (40 Units total) into the skin daily before breakfast. Patient taking differently: Inject 25 Units into the skin in the morning and at bedtime. 02/07/15  Yes Ditty, Loura Halt, MD  leflunomide (ARAVA) 10 MG tablet Take 10 mg by mouth daily.   Yes [provider]  levofloxacin (LEVAQUIN) 500 MG tablet Take 500 mg by mouth daily. 07/17/22 07/27/22 Yes [provider]  montelukast (SINGULAIR) 10 MG tablet Take 10 mg by mouth daily.   Yes [provider]  olmesartan-hydrochlorothiazide (BENICAR HCT) 40-12.5 MG tablet Take 1 tablet by mouth daily.   Yes [provider]  pantoprazole (PROTONIX) 40 MG tablet Take 40 mg by mouth 2 (two) times daily.   Yes [provider]  rOPINIRole (REQUIP) 0.5 MG tablet Take 0.5 mg by mouth at bedtime as needed (RLS).   Yes [provider]  simvastatin (ZOCOR) 40 MG tablet Take 40 mg by mouth at bedtime.   Yes [provider]  spironolactone (ALDACTONE) 25 MG tablet Take 25 mg by mouth daily.   Yes [provider]   US ARTERIAL ABI (SCREENING LOWER EXTREMITY)  Result Date: 07/26/2022 CLINICAL DATA:  Toe ulcer EXAM: RIGHT LOWER EXTREMITY ARTERIAL DUPLEX SCAN TECHNIQUE: Gray-scale sonography as well as color Doppler and duplex ultrasound was performed to evaluate the lower extremity arteries including the common, superficial and profunda femoral arteries, popliteal artery and calf arteries. Attempts were also made to obtain ankle-brachial indices bilaterally. COMPARISON:  None Available. FINDINGS: Right lower Extremity ABI: Noncompressible Inflow: Normal common femoral arterial waveforms and velocities. No evidence of inflow (aortoiliac) disease. Outflow: Normal profunda femoral, superficial femoral and popliteal arterial waveforms and velocities. No focal elevation of the PSV to suggest stenosis. Runoff: Normal posterior and anterior tibial arterial waveforms and velocities. Vessels are patent to the ankle. posterior and anterior tibial arterial waveforms and velocities. Vessels are patent to the ankle. IMPRESSION: 1. Unable to obtain ankle-brachial indices due to non compressibility of the arteries at the ankle. This is typically seen in the setting of medial arteriosclerosis. 2. Extensive calcified atherosclerotic plaque without evidence of hemodynamically significant stenosis or occlusion in the right lower extremity. Electronically Signed   By: Malachy Moan M.D.   On: 07/26/2022 15:56   US ARTERIAL LOWER EXTREMITY DUPLEX RIGHT(NON-ABI)  Result Date: 07/26/2022 CLINICAL DATA:  Toe ulcer EXAM: RIGHT LOWER EXTREMITY ARTERIAL DUPLEX SCAN TECHNIQUE: Gray-scale sonography as well as color  Doppler and duplex ultrasound was performed to evaluate the lower extremity arteries including the common, superficial and profunda femoral arteries, popliteal artery and calf arteries. Attempts were also made to obtain ankle-brachial indices bilaterally. COMPARISON:  None Available. FINDINGS: Right lower Extremity ABI: Noncompressible Inflow: Normal common femoral arterial waveforms and velocities. No evidence of inflow (aortoiliac) disease. Outflow: Normal profunda femoral, superficial femoral and popliteal arterial waveforms and velocities. No focal elevation of the  PSV to suggest stenosis. Runoff: Normal posterior and anterior tibial arterial waveforms and velocities. Vessels are patent to the ankle. posterior and anterior tibial arterial waveforms and velocities. Vessels are patent to the ankle. IMPRESSION: 1. Unable to obtain ankle-brachial indices due to non compressibility of the arteries at the ankle. This is typically seen in the setting of medial arteriosclerosis. 2. Extensive calcified atherosclerotic plaque without evidence of hemodynamically significant stenosis or occlusion in the right lower extremity. Electronically Signed   By: Malachy Moan M.D.   On: 07/26/2022 15:56   PERIPHERAL VASCULAR CATHETERIZATION  Result Date: 07/25/2022 See surgical note for result.  CT CHEST WO CONTRAST  Result Date: 07/24/2022 CLINICAL DATA:  Dyspnea on exertion, edema in lower extremities EXAM: CT CHEST WITHOUT CONTRAST TECHNIQUE: Multidetector CT imaging of the chest was performed following the standard protocol without IV contrast. RADIATION DOSE REDUCTION: This exam was performed according to the departmental dose-optimization program which includes automated exposure control, adjustment of the mA and/or kV according to patient size and/or use of iterative reconstruction technique. COMPARISON:  Previous studies including CT chest done on 09/03/2021 and chest radiographs done on 07/24/2022 FINDINGS:  Cardiovascular: Coronary artery calcifications are seen. Calcifications are seen thoracic aorta and its major branches. Mediastinum/Nodes: No significant lymphadenopathy is seen. Lungs/Pleura: There is no focal pulmonary consolidation. There is no pleural effusion or pneumothorax. In image 85 of series 3, a 7 x 5 mm pleural-based nodule in the medial right lower lung field which was not distinctly seen in the previous CT. In image 80, there is new 1.2 x 1 cm nodular density in the medial left lower lobe with central lucency. In image 86, there is 4 mm nodule in left lower lobe which appears stable. In image 77, there is 4 mm nodular density in right lower lung field and right middle lobe inseparable from the major fissure which was not distinctly seen on the previous study. Upper Abdomen: Calcifications are seen in the aorta and its major branches. Small hiatal hernia is seen. There is nodularity of the liver surface suggesting cirrhosis. Musculoskeletal: No acute findings are seen. There is a surgical fusion in lower cervical spine. IMPRESSION: There is no focal pulmonary consolidation. There is no pleural effusion or pneumothorax. There are multiple nodules in the lower lung fields measuring up to 1.2 cm in maximum diameter as described in the body of the report. Many of these nodules were not seen on the previous examination. Findings may be due to infectious process or neoplastic process. Short-term follow-up CT in 2-3 months may be considered. If the nodules are persistent, PET-CT and tissue sampling may be needed. There is nodularity in liver surface suggesting cirrhosis. Aortic arteriosclerosis. Coronary artery calcifications are seen. Electronically Signed   By: Ernie Avena M.D.   On: 07/24/2022 21:10    Positive ROS: All other systems have been reviewed and were otherwise negative with the exception of those mentioned in the HPI and as above.  12 point ROS was performed.  Physical  Exam: General: Alert and oriented.  No apparent distress.  Vascular:  Left foot:Dorsalis Pedis:  absent Posterior Tibial:  absent  Right foot: Dorsalis Pedis:  absent Posterior Tibial:  absent  Neuro:absent protective sensation  Derm: On the distal aspect of the right second toe is a very small superficial ulceration with hemorrhagic tissue and overlying hyperkeratosis.  Minimal erythema is noted today.  Previously on photos provided in the chart there was noted erythema.  There is no open  wound or drainage.  Small superficial eschar to the dorsal PIPJ of the left second toe.  No active purulent drainage surrounding this area.  Finally a superficial hemorrhagic blister to the medial aspect of the left third toe.  This is stable.  Ortho/MS: Mild hammertoe contractures of the lesser toes.  I personally reviewed x-rays which were negative for osteomyelitis.  Assessment: Peripheral vascular disease bilateral lower extremities with ulcerations Diabetes  Plan: These look to be stable at this time.  I would recommend local wound care which would consist of an antibiotic ointment to the left dorsal second toe and distal right second toe and cover with gentle bandage.  Remove any pressure from these wounds as well.  They should keep these protected and covered.  She may benefit from a percutaneous flexor tenotomy of the right second toe to remove the pressure on the distal aspect.  I believe this would allow this ulcerative site to heal.  This will be a fairly minimally invasive type of procedure.  Small hemorrhagic blister on the medial aspect of the left third toe.  Will leave this intact and let this dry up on its own.  I recommend follow-up in the outpatient clinic and we can discuss further treatments.      Irean Hong, DPM Cell [phone]   07/26/2022 5:16 PM

## 2022-07-26 NOTE — Progress Notes (Signed)
Progress Note    07/26/2022 9:20 AM 1 Day Post-Op  Subjective:  Courtney [name] is a 82 y.o. female with medical history significant of rheumatoid arthritis on Remicade and leflunomide, psoriasis, hypertension, hyperlipidemia, type 2 diabetes, pulmonary fibrosis, sleep apnea, osteoarthritis, who presents to the ED due to left toe ulceration.  Patient had left second toe several months ago and has been gradually worsening.  She has right second toe redness.    Patient is now POD #1 from left lower extremity angiogram. Angiogram was diagnostic showing severe atherosclerosis to her forefoot and toes. No angioplasty or stent placement. Patients tibioperoneal trunk is heavily diseased with single vessel run off to the foot anterior tibial artery.    Vitals:   07/25/22 2238 07/26/22 0737  BP: (!) 155/58 (!) 155/56  Pulse: 82 75  Resp: 20 16  Temp: 99.5 F (37.5 C) 97.7 F (36.5 C)  SpO2: 100% 100%   Physical Exam: Cardiac:  RRR, Normal S1, S2 no murmurs Lungs:  Clear throughout on auscultation. Normal respiratory effort. Incisions:  Right groin with dressing clean and dry. No hematoma Extremities:  Bilateral lower extremities with strong DP pulses and weak doppler PT pulses.  Abdomen:  Positive bowel sounds, soft and non tender and non distended.  Neurologic: AAOX3 and follows all commands.   CBC    Component Value Date/Time   WBC 6.5 07/25/2022 0035   RBC 4.15 07/25/2022 0035   HGB 12.0 07/25/2022 0035   HCT 36.0 07/25/2022 0035   PLT 120 (L) 07/25/2022 0035   MCV 86.7 07/25/2022 0035   MCH 28.9 07/25/2022 0035   MCHC 33.3 07/25/2022 0035   RDW 13.4 07/25/2022 0035   LYMPHSABS 2.7 07/25/2022 0035   MONOABS 0.7 07/25/2022 0035   EOSABS 0.3 07/25/2022 0035   BASOSABS 0.0 07/25/2022 0035    BMET    Component Value Date/Time   NA 137 07/25/2022 0035   K 3.7 07/25/2022 0035   CL 105 07/25/2022 0035   CO2 23 07/25/2022 0035   GLUCOSE 198 (H) 07/25/2022 0035   BUN 58 (H)  07/25/2022 0035   CREATININE 1.32 (H) 07/26/2022 0426   CALCIUM 9.4 07/25/2022 0035   GFRNONAA 40 (L) 07/26/2022 0426   GFRAA >60 08/13/2017 1430    INR    Component Value Date/Time   INR 1.22 12/25/2014 0759     Intake/Output Summary (Last 24 hours) at 07/26/2022 0920 Last data filed at 07/26/2022 0300 Gross per 24 hour  Intake 731.25 ml  Output --  Net 731.25 ml   EXAM: RIGHT LOWER EXTREMITY ARTERIAL DUPLEX SCAN   TECHNIQUE: Gray-scale sonography as well as color Doppler and duplex ultrasound was performed to evaluate the lower extremity arteries including the common, superficial and profunda femoral arteries, popliteal artery and calf arteries.   Attempts were also made to obtain ankle-brachial indices bilaterally.   COMPARISON:  None Available.   FINDINGS: Right lower Extremity   ABI: Noncompressible   Inflow: Normal common femoral arterial waveforms and velocities. No evidence of inflow (aortoiliac) disease.   Outflow: Normal profunda femoral, superficial femoral and popliteal arterial waveforms and velocities. No focal elevation of the PSV to suggest stenosis.   Runoff: Normal posterior and anterior tibial arterial waveforms and velocities. Vessels are patent to the ankle.   posterior and anterior tibial arterial waveforms and velocities. Vessels are patent to the ankle.   IMPRESSION: 1. Unable to obtain ankle-brachial indices due to non compressibility of the arteries at the ankle. This  is typically seen in the setting of medial arteriosclerosis. 2. Extensive calcified atherosclerotic plaque without evidence of hemodynamically significant stenosis or occlusion in the right lower extremity.   Assessment/Plan:  82 y.o. female is s/p Aortogram with left lower extremity angiogram 1 Day Post-Op   PLAN: Ultrasound of bilateral lower extremities demonstrate limited arterial flow to forefoot and toes. Podiatry consulted for further evaluation of  ulceration and cellulitis of toes.  Recommend Dietician consult to help with patients diet with her diabetes.  Recommend Diabetic Educator consult for teaching patient Diabetes management.   DVT prophylaxis:  ASA 81 mg daily and Lovenox 40 mg SQ Daily   Fryda Molenda R Shanikka Wonders [facility] 07/26/2022 9:20 AM

## 2022-07-26 NOTE — Progress Notes (Signed)
Progress Note   Patient: Courtney [name] ZOX:[id number] DOB: 12-08-1940 DOA: 07/24/2022     1 DOS: the patient was seen and examined on 07/26/2022     Subjective:  Patient seen and examined this morning in the presence of the husband She tells me leg infection is improving Denies vomiting abdominal pain chest pain cough     Brief hospital course: 82 y.o. female Courtney [name] is a 82 y.o. female with medical history significant of rheumatoid arthritis on Remicade and leflunomide, psoriasis, hypertension, hyperlipidemia, type 2 diabetes, pulmonary fibrosis, sleep apnea, osteoarthritis, who presents to the ED due to left toe ulceration.  Patient had left second toe several months ago and has been gradually worsening.  She has right second toe redness.  Patient is admitted to hospitalist service for further management, IV antibiotics and vascular evaluation.    Assessment and Plan: Left second toe ulcer (HCC) with surrounding infection Patient noticed changes in skin of left second toe and right second toe since 3 weeks. Vascular evaluation appreciated.   Patient underwent abdominal aortogram, selective injection of the left lower extremity third order catheter placement, ultrasound-guided access to the right common femoral artery as well as a [employer] right femoral artery done by vascular surgeon on 07/25/2022 Unable to get CTA due to kidney dysfunction. Follow-up on echocardiogram report which is pending Continue telemetry monitoring Continue ceftriaxone, Flagyl.   AKI (acute kidney injury) (HCC)-improving Likely due to overdiuresis.  Patient got LR bolus Continue to monitor renal function Avoid nephrotoxic medications Daily dose of drugs    Fibrosis, pulmonary, interstitial, diffuse (HCC) CT chest shows multiple lower lobe nodules measuring 1.2cm. She was on methotrexate which is stopped due to pulmonary nodules. Will get pulmonary evaluation. Continuous pulse oximetry. Oxygen  saturation    Type 2 diabetes mellitus without complication, without long-term current use of insulin (HCC) Hold home antiglycemic agents Monitor glucose level closely Continue accucheks, sliding scale insulin.   Essential hypertension Continue to hold olmesartan-HCTZ and spironolactone due to AKI Continue bisoprolol   OSA on CPAP Continue CPAP at night   Thrombocytopenia (HCC) Mild and chronic.  No petechiae on examination. No indication for platelet transfusion at this time Continue to monitor platelets closely   Rheumatoid arthritis, unspecified (HCC) Patient has a history of rheumatoid arthritis  Continue Remicade and leflunomide.       General - Elderly Caucasian female, no apparent distress HEENT - PERRLA, EOMI, atraumatic head, non tender sinuses. Lung -clear to auscultation Heart - S1, S2 heard, no murmurs, rubs, bilateral lower extremity edema 1+ Neuro - Alert, awake and oriented x 3, non focal exam. Skin -right second toe erythema, differential warmth with coldness noted to the right lower extremities, nontender  Data Reviewed: I have reviewed patient's BMP results today, patient's previous records as well as nursing and consultant documentation  Family Communication: Patient's husband present at bedside  Time spent: 40 minutes spent reviewing patient's records as well as discussing goals of care with patient, patient's husband, consultants transition of care as well as nursing staff Vitals:   07/25/22 1801 07/25/22 2238 07/26/22 0737 07/26/22 1542  BP: (!) 166/71 (!) 155/58 (!) 155/56 (!) 109/49  Pulse: 72 82 75 74  Resp: 14 20 16 16   Temp: 97.8 F (36.6 C) 99.5 F (37.5 C) 97.7 F (36.5 C) 97.9 F (36.6 C)  TempSrc:      SpO2: 99% 100% 100% 99%  Weight:      Height:  Author: Loyce Dys, MD 07/26/2022 3:48 PM  For on call review [email or web address].

## 2022-07-26 NOTE — Progress Notes (Signed)
Initial Nutrition Assessment  DOCUMENTATION CODES:   Not applicable  INTERVENTION:   -1 packet Juven BID, each packet provides 95 calories, 2.5 grams of protein (collagen), and 9.8 grams of carbohydrate (3 grams sugar); also contains 7 grams of L-arginine and L-glutamine, 300 mg vitamin C, 15 mg vitamin E, 1.2 mcg vitamin B-12, 9.5 mg zinc, 200 mg calcium, and 1.5 g  Calcium Beta-hydroxy-Beta-methylbutyrate to support wound healing  -MVI with minerals daily -500 mg vitamin C BID -220 mg zinc sulfate daily x 14 days -Magic cup BID with meals, each supplement provides 290 kcal and 9 grams of protein   NUTRITION DIAGNOSIS:   Increased nutrient needs related to wound healing as evidenced by estimated needs.  GOAL:   Patient will meet greater than or equal to 90% of their needs  MONITOR:   PO intake, Supplement acceptance  REASON FOR ASSESSMENT:   Malnutrition Screening Tool    ASSESSMENT:   Pt with medical history significant of rheumatoid arthritis on Remicade and leflunomide, psoriasis, hypertension, hyperlipidemia, type 2 diabetes, pulmonary fibrosis, sleep apnea, osteoarthritis, who presents due to left toe ulceration.  Pt admitted with lt second toe ulcer with surrounding infection.    7/16- s/p abdominal aortogram, selective injection of the left lower extremity third order catheter placement, ultrasound-guided access to the right common femoral artery as well as a [employer] right femoral artery   Reviewed I/O's: +731 ml x 24 hours and +1.5 L since admission  Pt in with vascular surgery at time of visit.   Pt currently on a heart healthy diet. No meal completion data available to assess at this time.   Reviewed wt hx; pt has experienced a 5.8% wt loss over the past 6 months, which is not significant for time frame.   Medications reviewed.   Lab Results  Component Value Date   HGBA1C 6.2 (H) 05/17/2021   PTA DM medications are 25 units insulin NPH BID.   Labs  reviewed: CBGS: 366 (inpatient orders for glycemic control are 0-5 insulin aspart daily at bedtime, 0-9 units insulin aspart TID with meals, and 15 units insulin glargine-yfgn daily at bedtime).    NUTRITION - FOCUSED PHYSICAL EXAM:  Flowsheet Row Most Recent Value  Orbital Region No depletion  Upper Arm Region No depletion  Thoracic and Lumbar Region No depletion  Buccal Region No depletion  Temple Region No depletion  Clavicle Bone Region No depletion  Clavicle and Acromion Bone Region No depletion  Scapular Bone Region No depletion  Dorsal Hand No depletion  Patellar Region No depletion  Anterior Thigh Region No depletion  Posterior Calf Region No depletion  Edema (RD Assessment) Mild  Hair Reviewed  Eyes Reviewed  Mouth Reviewed  Skin Reviewed  Nails Reviewed       Diet Order:   Diet Order             Diet Heart Room service appropriate? Yes; Fluid consistency: Thin  Diet effective now                   EDUCATION NEEDS:   No education needs have been identified at this time  Skin:  Skin Assessment: Skin Integrity Issues: Skin Integrity Issues:: Incisions Incisions: lt lower breast  Last BM:  07/23/22  Height:   Ht Readings from Last 1 Encounters:  07/24/22 5\' 6"  (1.676 m)    Weight:   Wt Readings from Last 1 Encounters:  07/24/22 81.6 kg    Ideal Body Weight:  59.1  kg  BMI:  Body mass index is 29.05 kg/m.  Estimated Nutritional Needs:   Kcal:  1600-1800  Protein:  75-90 grams  Fluid:  > 1.6 L    Levada Schilling, RD, LDN, CDCES Registered Dietitian II Certified Diabetes Care and Education Specialist Please refer to [facility] for RD and/or RD on-call/weekend/after hours pager

## 2022-07-27 DIAGNOSIS — L97519 Non-pressure chronic ulcer of other part of right foot with unspecified severity: Secondary | ICD-10-CM

## 2022-07-27 LAB — ECHOCARDIOGRAM COMPLETE
AR max vel: 1.15 cm2
AV Area VTI: 1.35 cm2
AV Area mean vel: 1.33 cm2
AV Mean grad: 11 mmHg
AV Peak grad: 20.7 mmHg
Ao pk vel: 2.28 m/s
Area-P 1/2: 4.04 cm2
Height: 66 in
MV VTI: 2.46 cm2
S' Lateral: 2.6 cm
Weight: 2880 oz

## 2022-07-27 LAB — CBC WITH DIFFERENTIAL/PLATELET
Abs Immature Granulocytes: 0.02 10*3/uL (ref 0.00–0.07)
Basophils Absolute: 0 10*3/uL (ref 0.0–0.1)
Basophils Relative: 0 %
Eosinophils Absolute: 0.1 10*3/uL (ref 0.0–0.5)
Eosinophils Relative: 2 %
HCT: 31.5 % — ABNORMAL LOW (ref 36.0–46.0)
Hemoglobin: 10.4 g/dL — ABNORMAL LOW (ref 12.0–15.0)
Immature Granulocytes: 0 %
Lymphocytes Relative: 46 %
Lymphs Abs: 2.4 10*3/uL (ref 0.7–4.0)
MCH: 29 pg (ref 26.0–34.0)
MCHC: 33 g/dL (ref 30.0–36.0)
MCV: 87.7 fL (ref 80.0–100.0)
Monocytes Absolute: 0.6 10*3/uL (ref 0.1–1.0)
Monocytes Relative: 12 %
Neutro Abs: 2 10*3/uL (ref 1.7–7.7)
Neutrophils Relative %: 40 %
Platelets: 93 10*3/uL — ABNORMAL LOW (ref 150–400)
RBC: 3.59 MIL/uL — ABNORMAL LOW (ref 3.87–5.11)
RDW: 13.6 % (ref 11.5–15.5)
WBC: 5.1 10*3/uL (ref 4.0–10.5)
nRBC: 0 % (ref 0.0–0.2)

## 2022-07-27 LAB — GLUCOSE, CAPILLARY
Glucose-Capillary: 192 mg/dL — ABNORMAL HIGH (ref 70–99)
Glucose-Capillary: 195 mg/dL — ABNORMAL HIGH (ref 70–99)

## 2022-07-27 LAB — BASIC METABOLIC PANEL
Anion gap: 8 (ref 5–15)
BUN: 57 mg/dL — ABNORMAL HIGH (ref 8–23)
CO2: 24 mmol/L (ref 22–32)
Calcium: 9.1 mg/dL (ref 8.9–10.3)
Chloride: 107 mmol/L (ref 98–111)
Creatinine, Ser: 1.27 mg/dL — ABNORMAL HIGH (ref 0.44–1.00)
GFR, Estimated: 42 mL/min — ABNORMAL LOW (ref >60)
Glucose, Bld: 293 mg/dL — ABNORMAL HIGH (ref 70–99)
Potassium: 4.1 mmol/L (ref 3.5–5.1)
Sodium: 139 mmol/L (ref 135–145)

## 2022-07-27 LAB — HEMOGLOBIN A1C
Hgb A1c MFr Bld: 7.1 % — ABNORMAL HIGH (ref 4.8–5.6)
Mean Plasma Glucose: 157 mg/dL

## 2022-07-27 MED ORDER — ZINC SULFATE 220 (50 ZN) MG PO CAPS: 220.0000 mg | ORAL_CAPSULE | Freq: Every day | ORAL | 0 refills | Status: DC

## 2022-07-27 MED ORDER — MELATONIN 10 MG PO TABS: 10.0000 mg | ORAL_TABLET | Freq: Every day | ORAL | 0 refills | Status: DC

## 2022-07-27 MED ORDER — ADULT MULTIVITAMIN W/MINERALS CH: 1.0000 | ORAL_TABLET | Freq: Every day | ORAL | 0 refills | Status: DC

## 2022-07-27 MED ORDER — METRONIDAZOLE 500 MG PO TABS: 500.0000 mg | ORAL_TABLET | Freq: Three times a day (TID) | ORAL | 0 refills | Status: AC

## 2022-07-27 MED ORDER — AMOXICILLIN 875 MG PO TABS: 875.0000 mg | ORAL_TABLET | Freq: Two times a day (BID) | ORAL | 0 refills | Status: AC

## 2022-07-27 MED ORDER — DIPHENHYDRAMINE HCL 25 MG PO CAPS: 25.0000 mg | ORAL_CAPSULE | Freq: Four times a day (QID) | ORAL | Status: DC | PRN

## 2022-07-27 MED ORDER — BACITRACIN ZINC 500 UNIT/GM EX OINT: TOPICAL_OINTMENT | Freq: Two times a day (BID) | CUTANEOUS | 0 refills | Status: DC

## 2022-07-27 MED ORDER — FLUCONAZOLE 50 MG PO TABS
150.0000 mg | ORAL_TABLET | Freq: Once | ORAL | Status: AC
  Administered 2022-07-27: 150 mg via ORAL
  Filled 2022-07-27: qty 1

## 2022-07-27 MED ORDER — ASCORBIC ACID 500 MG PO TABS: 500.0000 mg | ORAL_TABLET | Freq: Two times a day (BID) | ORAL | 0 refills | Status: DC

## 2022-07-27 MED ORDER — POLYETHYLENE GLYCOL 3350 17 G PO PACK: 17.0000 g | PACK | Freq: Every day | ORAL | 0 refills | Status: DC | PRN

## 2022-07-27 NOTE — Discharge Summary (Signed)
Physician Discharge Summary   Patient: Courtney [name] MRN: [id number] DOB: 1940/02/23  Admit date:     07/24/2022  Discharge date: 07/27/22  Discharge Physician: Loyce Dys   PCP: Danella Penton, MD    Discharge Diagnoses: Left second toe ulcer (HCC) with surrounding infection AKI (acute kidney injury) (HCC)-improving  Fibrosis, pulmonary, interstitial, diffuse (HCC) Type 2 diabetes mellitus without complication, without long-term current use of insulin (HCC) Essential hypertension OSA on CPAP Thrombocytopenia (HCC) Rheumatoid arthritis, unspecified [facility])   Hospital Course: 82 y.o. female Courtney [name] is a 82 y.o. female with medical history significant of rheumatoid arthritis on Remicade and leflunomide, psoriasis, hypertension, hyperlipidemia, type 2 diabetes, pulmonary fibrosis, sleep apnea, osteoarthritis, who presents to the ED due to left toe ulceration.  Patient had left second toe several months ago and has been gradually worsening.  She has right second toe redness.  Patient was admitted to hospitalist service for further management, IV antibiotics and vascular evaluation.  Patient was seen by surgeon and underwent ultrasound of bilateral lower extremities which demonstrated a limited arterial flow to the forefoot and toes.  Patient was also seen by podiatrist who recommended wound management and outpatient follow-up.  Podiatry recommended  'local wound care which would consist of an antibiotic ointment to the left dorsal second toe and distal right second toe and cover with gentle bandage. Remove any pressure from these wounds as well. They should keep these protected and covered.'  Concerning her pulmonary nodules I discussed the case with Dr. Karna Christmas who recommended patient can follow-up at the pulmonary clinic as an outpatient   Consultants: Pulmonary, vascular, podiatry Procedures performed: As mentioned above Disposition: Home health Diet recommendation:  Discharge  Diet Orders (From admission, onward)     Start     Ordered   07/27/22 0000  Diet - low sodium heart healthy        07/27/22 1259           Carb modified diet DISCHARGE MEDICATION: Allergies as of 07/27/2022       Reactions   Methotrexate Derivatives Shortness Of Breath   Breathing difficulties    Acyclovir And Related Rash   Codeine Nausea Only   Contrast Media [iodinated Contrast Media] Itching, Rash   Inderide [propranolol-hctz] Rash   Lodine [etodolac] Rash   Metrizamide Itching, Rash   Procardia [nifedipine] Rash        Medication List     STOP taking these medications    leflunomide 10 MG tablet Commonly known as: ARAVA   levofloxacin 500 MG tablet Commonly known as: LEVAQUIN   olmesartan-hydrochlorothiazide 40-12.5 MG tablet Commonly known as: BENICAR HCT   spironolactone 25 MG tablet Commonly known as: ALDACTONE       TAKE these medications    acetaminophen 500 MG tablet Commonly known as: TYLENOL Take 500 mg by mouth every 6 (six) hours as needed (for pain.).   amoxicillin 875 MG tablet Commonly known as: AMOXIL Take 1 tablet (875 mg total) by mouth 2 (two) times daily for 5 days.   ascorbic acid 500 MG tablet Commonly known as: VITAMIN C Take 1 tablet (500 mg total) by mouth 2 (two) times daily.   aspirin EC 81 MG tablet Take 81 mg by mouth daily.   bacitracin ointment Apply topically 2 (two) times daily.   bisoprolol 10 MG tablet Commonly known as: ZEBETA Take 1 tablet (10 mg total) by mouth daily.   cetirizine 10 MG tablet Commonly known as:  ZYRTEC Take 10 mg by mouth at bedtime.   D3 25 MCG (1000 UT) capsule Generic drug: Cholecalciferol Take 1,000 Units by mouth daily.   famotidine 40 MG tablet Commonly known as: PEPCID Take 40 mg by mouth daily.   gabapentin 100 MG capsule Commonly known as: NEURONTIN Take 100 mg by mouth 2 (two) times daily.   insulin NPH Human 100 UNIT/ML injection Commonly known as: NOVOLIN  N Inject 0.4 mLs (40 Units total) into the skin daily before breakfast. What changed:  how much to take when to take this   Melatonin 10 MG Tabs Take 10 mg by mouth at bedtime.   metroNIDAZOLE 500 MG tablet Commonly known as: Flagyl Take 1 tablet (500 mg total) by mouth 3 (three) times daily for 5 days.   montelukast 10 MG tablet Commonly known as: SINGULAIR Take 10 mg by mouth daily.   multivitamin with minerals Tabs tablet Take 1 tablet by mouth daily. Start taking on: July 28, 2022   pantoprazole 40 MG tablet Commonly known as: PROTONIX Take 40 mg by mouth 2 (two) times daily.   polyethylene glycol 17 g packet Commonly known as: MIRALAX / GLYCOLAX Take 17 g by mouth daily as needed for mild constipation.   rOPINIRole 0.5 MG tablet Commonly known as: REQUIP Take 0.5 mg by mouth at bedtime as needed (RLS).   simvastatin 40 MG tablet Commonly known as: ZOCOR Take 40 mg by mouth at bedtime.   zinc sulfate 220 (50 Zn) MG capsule Take 1 capsule (220 mg total) by mouth daily. Start taking on: July 28, 2022        Follow-up Information     Gwyneth Revels, DPM Follow up.   Specialty: Podiatry Contact information: [address] [place] [address] [phone]         Mertie Moores, MD. Schedule an appointment as soon as possible for a visit.   Specialty: Specialist Contact information: [address] [place] [address] [phone]                Discharge Exam:  General - Elderly Caucasian female, no apparent distress HEENT - PERRLA, EOMI, atraumatic head, non tender sinuses. Lung -clear to auscultation Heart - S1, S2 heard, no murmurs, rubs, bilateral lower extremity edema 1+ Neuro - Alert, awake and oriented x 3, non focal exam. Skin -right second toe erythema, differential warmth with coldness noted to the right lower extremities, nontender    Condition at discharge: good    Discharge time spent: 35  minutes  Signed: Loyce Dys, MD [facility] 07/27/2022

## 2022-07-27 NOTE — Progress Notes (Signed)
DISCHARGE NOTE:    Pt discharged with instructions and education given. IV removed and no further questions or concerns. Pt's family member provided transportation. Pt wheeled down to medical mall entrance .

## 2022-07-27 NOTE — Plan of Care (Signed)

## 2022-07-27 NOTE — TOC Progression Note (Addendum)
Transition of Care [facility]) - Progression Note    Patient Details  Name: Courtney [name] MRN: [id number] Date of Birth: 05-02-1940  [facility]) CM/SW Contact  Marlowe Sax, RN Phone Number: 07/27/2022, 1:09 PM  Clinical Narrative:    Spoke with the patient, she has used HH in the past but not sure what the name of the company is, she has no preference   Sent referral to [place] [facility] [place], he accepted the patient The patient lives at home with her husband  She has all the needed DME Her daughter will provide transportation today but her husband is the one that usually does   Expected Discharge Plan: Home w Home Health Services Barriers to Discharge: Barriers Resolved  Expected Discharge Plan and Services   Discharge Planning Services: CM Consult   Living arrangements for the past 2 months: Single Family Home Expected Discharge Date: 07/27/22               DME Arranged: N/A DME Agency: NA       HH Arranged: PT HH Agency: [facility] Date HH Agency Contacted: 07/27/22 Time HH Agency Contacted: [id number] Representative spoke with at [facility] Agency: Barbara Cower   Social Determinants of Health (SDOH) Interventions SDOH Screenings   Food Insecurity: No Food Insecurity (07/25/2022)  Housing: Low Risk  (07/25/2022)  Transportation Needs: No Transportation Needs (07/25/2022)  Utilities: Not At Risk (07/25/2022)  Tobacco Use: Low Risk  (07/25/2022)    Readmission Risk Interventions     No data to display

## 2022-07-27 NOTE — TOC Progression Note (Signed)
Transition of Care [facility]) - Progression Note    Patient Details  Name: Courtney [name] MRN: [id number] Date of Birth: 08-15-1940  Transition of Care [facility]) CM/SW Contact  Marlowe Sax, RN Phone Number: 07/27/2022, 10:24 AM  Clinical Narrative:    TOC continues to follow the patient and assist with DC planning and needs   Expected Discharge Plan:  (TBD) Barriers to Discharge: Continued Medical Work up  Expected Discharge Plan and Services   Discharge Planning Services: CM Consult                                           Social Determinants of Health (SDOH) Interventions SDOH Screenings   Food Insecurity: No Food Insecurity (07/25/2022)  Housing: Low Risk  (07/25/2022)  Transportation Needs: No Transportation Needs (07/25/2022)  Utilities: Not At Risk (07/25/2022)  Tobacco Use: Low Risk  (07/25/2022)    Readmission Risk Interventions     No data to display

## 2022-07-28 LAB — CULTURE, BLOOD (ROUTINE X 2): Culture: NO GROWTH

## 2022-07-29 LAB — CULTURE, BLOOD (ROUTINE X 2): Culture: NO GROWTH

## 2022-07-30 LAB — CULTURE, BLOOD (ROUTINE X 2)
Special Requests: ADEQUATE
Special Requests: ADEQUATE

## 2022-08-08 ENCOUNTER — Encounter (INDEPENDENT_AMBULATORY_CARE_PROVIDER_SITE_OTHER): Payer: Medicare Other

## 2022-08-08 ENCOUNTER — Ambulatory Visit (INDEPENDENT_AMBULATORY_CARE_PROVIDER_SITE_OTHER): Payer: BLUE CROSS/BLUE SHIELD | Admitting: Vascular Surgery

## 2022-08-22 ENCOUNTER — Encounter: Payer: Medicare Other | Attending: Physician Assistant | Admitting: Physician Assistant

## 2022-08-22 DIAGNOSIS — E1140 Type 2 diabetes mellitus with diabetic neuropathy, unspecified: Secondary | ICD-10-CM | POA: Diagnosis not present

## 2022-08-22 DIAGNOSIS — E1151 Type 2 diabetes mellitus with diabetic peripheral angiopathy without gangrene: Secondary | ICD-10-CM | POA: Diagnosis not present

## 2022-08-22 DIAGNOSIS — G4730 Sleep apnea, unspecified: Secondary | ICD-10-CM | POA: Diagnosis not present

## 2022-08-22 DIAGNOSIS — L97829 Non-pressure chronic ulcer of other part of left lower leg with unspecified severity: Secondary | ICD-10-CM | POA: Diagnosis not present

## 2022-08-22 DIAGNOSIS — I87033 Postthrombotic syndrome with ulcer and inflammation of bilateral lower extremity: Secondary | ICD-10-CM | POA: Diagnosis not present

## 2022-08-22 DIAGNOSIS — I872 Venous insufficiency (chronic) (peripheral): Secondary | ICD-10-CM | POA: Insufficient documentation

## 2022-08-22 DIAGNOSIS — M069 Rheumatoid arthritis, unspecified: Secondary | ICD-10-CM | POA: Diagnosis not present

## 2022-08-22 DIAGNOSIS — L97919 Non-pressure chronic ulcer of unspecified part of right lower leg with unspecified severity: Secondary | ICD-10-CM | POA: Diagnosis not present

## 2022-08-22 DIAGNOSIS — I10 Essential (primary) hypertension: Secondary | ICD-10-CM | POA: Diagnosis present

## 2022-08-22 DIAGNOSIS — L98492 Non-pressure chronic ulcer of skin of other sites with fat layer exposed: Secondary | ICD-10-CM | POA: Insufficient documentation

## 2022-08-22 DIAGNOSIS — E11622 Type 2 diabetes mellitus with other skin ulcer: Secondary | ICD-10-CM | POA: Insufficient documentation

## 2022-08-22 NOTE — Progress Notes (Signed)
CLIDE, ZENGEL [name] ([id number]) [id number] Page 1 of 11 Visit Report for 08/22/2022 Chief Complaint Document Details Patient Name: Date of Service: Courtney [name] 08/22/2022 8:15 [name] Medical Record Number: [id number] Patient Account Number: [id number] Date of Birth/Sex: Treating RN: 03-23-1940 (82 y.o. Courtney [name] Primary Care Provider: Bethann Punches Other Clinician: Referring Provider: Treating Provider/Extender: Jasmine Pang in Treatment: 0 Information Obtained from: Patient Chief Complaint Left breast surgical ulcer Electronic Signature(s) Signed: 08/22/2022 9:14:22 AM By: Allen Derry PA-C Entered By: Allen Derry on 08/22/2022 09:14:22 -------------------------------------------------------------------------------- Debridement Details Patient Name: Date of Service: Courtney [name]. 08/22/2022 8:15 [name] Medical Record Number: [id number] Patient Account Number: [id number] Date of Birth/Sex: Treating RN: 1940-01-25 (82 y.o. Courtney [name] Primary Care Provider: Bethann Punches Other Clinician: Referring Provider: Treating Provider/Extender: Jasmine Pang in Treatment: 0 Debridement Performed for Assessment: Wound #2 Left Breast Performed By: Physician Allen Derry, PA-C Debridement Type: Chemical/Enzymatic/Mechanical Agent Used: saline gauze Level of Consciousness (Pre-procedure): Awake and Alert Pre-procedure Verification/Time Out Yes - 09:25 Taken: Pain Control: Lidocaine 4% Topical Solution Percent of Wound Bed Debrided: Instrument: Other : saline gauze Bleeding: None Response to Treatment: Procedure was tolerated well Level of Consciousness (Post- Awake and Alert procedure): Post Debridement Measurements of Total Wound Length: (cm) 1 Width: (cm) 2.5 Courtney [name], Courtney [name] ([id number]) [id number] Page 2 of 11 Depth: (cm) 4 Volume: (cm) 7.854 Character of Wound/Ulcer Post  Debridement: Stable Post Procedure Diagnosis Same as Pre-procedure Electronic Signature(s) Signed: 08/22/2022 4:30:30 PM By: Allen Derry PA-C Signed: 08/22/2022 4:40:48 PM By: Angelina Pih Entered By: Angelina Pih on 08/22/2022 09:38:13 -------------------------------------------------------------------------------- HPI Details Patient Name: Date of Service: Courtney [name]. 08/22/2022 8:15 [name] Medical Record Number: [id number] Patient Account Number: [id number] Date of Birth/Sex: Treating RN: 1940-10-16 (82 y.o. Courtney [name] Primary Care Provider: Bethann Punches Other Clinician: Referring Provider: Treating Provider/Extender: Jasmine Pang in Treatment: 0 History of Present Illness HPI Description: 12-22-2021 upon evaluation today patient appears to be doing well currently in regard to her wound all things considered especially in light of the picture that they showed me from August when she initially had [name] skin tear. Fortunately there does not appear to be any signs of active infection locally nor systemically at this time which is great news and overall I am extremely pleased with the fact that she is making some progress here she has been placed on Cipro due to [name] culture result that was done by rheumatologist which showed that she had Pseudomonas and Staphylococcus aureus noted as the organisms in question. Fortunately I do not think that there is anything that seems to be still present or significant here I think she is actually doing much better which is great news. With that being said the patient tells me that she has been using Medihoney at this point which actually is really good does help to clear up [name] lot of this I do believe. In the beginning she was also placed on or rather in an Unna boot wrap based on what we are being told that was for [name] couple of weeks and then she did not have any additional follow-up with primary eventually rheumatology who  were doing injections for her realize that she had [name] wound they stopped the injections which obviously would lower her immune response and healing chances. That is when she started on the antibiotics and subsequently also was referred to vascular. The good news is  from [name] vascular standpoint she seems to be probably sufficient to heal with [name] right ABI of 1.02 and [name] left ABI of 1.02 as well. Her TBI on the right was 0.63 and on the left 0.54. She does have [name] history of diabetes mellitus type 2 and she has an A1c of 6.5 on 10-17-2021. Other than this she also does have chronic venous insufficiency her leg is swollen today I definitely think [name] compression wraps probably can help her she also has hypertension 12-29-2021 upon evaluation patient's wound is actually showing signs of excellent improvement. Fortunately I do not see any signs of infection I think she is making good progress here and overall I do believe that she is really looking quite nice as far as the overall appearance of the wound bed is concerned. There is [name] little bit of necrotic tissue noted on the surface of the wound that is going require debridement today. 12/28; continued improvement in the area on the left lateral leg which was initially [name] skin tear in the setting of chronic venous insufficiency. She has been using Prisma with 3 layer compression. The length of the wound is down 0.5 cm 01-12-2022 upon evaluation today patient appears to be doing well currently in regard to her leg ulcer. She has been tolerating dressing changes without complication. Fortunately I do not see any signs of active infection locally nor systemically which is great news. No fevers, chills, nausea, vomiting, or diarrhea. 01-20-2022 upon evaluation today patient appears to be doing well currently in regard to her wound. In fact this is measuring significantly smaller and I am very pleased with where we stand. I do not see any signs of active infection locally  or systemically at this time. No fevers, chills, nausea, vomiting, or diarrhea. 02-03-2022 upon evaluation today patient appears to be doing well currently in regard to her wound. In fact she appears to be completely healed based on what I am seeing. Fortunately I do not see any signs of active infection locally nor systemically which is great news. There is little piece of dried skin where the dressing had stuck once I remove this however there was nothing remaining underneath.' Readmission: 08-22-2022 this is an individual whom I am just seen actually at the beginning of the year although she healed this was in regard to her legs and her legs have continued to do quite well although she did have some issues with cellulitis but that was taking care of recently she has not been wearing her compression socks I did have [name] brief conversation with her about the fact that she needs to make sure to wear these on [name] regular basis she voiced understanding. Unfortunately the patient around January which is when I was seeing her of this past year started to have [name] mass noted in her left breast. Subsequent that in December she had had some nipple drainage although she did not pay too much attention to it even with [name] mass noted. By March she had bloody drainage coming out of this and there was the mass actually identified by way of I believe ultrasound at that point. They went in from [name] surgical standpoint on June 10 and remove the mass the good news is this was not cancer the unfortunate news that she is having [name] difficult time getting this to heal following. She has not been told to pack this although she tells me she was put [name] little bit of gauze in herself. Fortunately I  do not see any signs of active infection at this point which Courtney [name], Courtney [name] ([id number]) [id number] Page 3 of 11 is good news. She did request [name] referral back to [place] to try to help get this closed as we have done well  with her leg. Patient's most recent hemoglobin A1c was on 07-26-2022 and was 7.1 this is doing pretty well at this point in my opinion. Otherwise her past medical history really has not changed significantly. Electronic Signature(s) Signed: 08/22/2022 10:06:53 AM By: Allen Derry PA-C Entered By: Allen Derry on 08/22/2022 10:06:52 -------------------------------------------------------------------------------- Physical Exam Details Patient Name: Date of Service: Courtney [name], Courtney [name] 08/22/2022 8:15 [name] Medical Record Number: [id number] Patient Account Number: [id number] Date of Birth/Sex: Treating RN: September 14, 1940 (82 y.o. Courtney [name] Primary Care Provider: Bethann Punches Other Clinician: Referring Provider: Treating Provider/Extender: Jasmine Pang in Treatment: 0 Constitutional sitting or standing blood pressure is within target range for patient.. pulse regular and within target range for patient.Marland Kitchen respirations regular, non-labored and within target range for patient.Marland Kitchen temperature within target range for patient.. Well-nourished and well-hydrated in no acute distress. Eyes conjunctiva clear no eyelid edema noted. pupils equal round and reactive to light and accommodation. Ears, Nose, Mouth, and Throat no gross abnormality of ear auricles or external auditory canals. normal hearing noted during conversation. mucus membranes moist. Respiratory normal breathing without difficulty. Musculoskeletal normal gait and posture. no significant deformity or arthritic changes, no loss or range of motion, no clubbing. Psychiatric this patient is able to make decisions and demonstrates good insight into disease process. Alert and Oriented x 3. pleasant and cooperative. Notes Upon inspection patient's wound bed actually showed signs of having [name] fairly deep wound about 4 cm in depth on the left breast at the 12:00 location in regard to the nipple. With that being said she actually  appears to be fairly clean at this point with regard to the wound bed there is some necrotic tissue [name] lot of this is along the edge however and is very attached I think that we will get [name] need to see if we can get this to loosen up [name] little bit I think Dakin's moistened gauze dressings over the next week would probably do good to help to clean this up. She is in agreement with that plan. We do not see how this goes will make any adjustments in care as needed going forward. Patient is in agreement with that plan. Electronic Signature(s) Signed: 08/22/2022 10:07:35 AM By: Allen Derry PA-C Entered By: Allen Derry on 08/22/2022 10:07:35 Courtney [name] ([id number]) [id number] Page 4 of 11 -------------------------------------------------------------------------------- Physician Orders Details Patient Name: Date of Service: MEE, CHERNEY 08/22/2022 8:15 [name] Medical Record Number: [id number] Patient Account Number: [id number] Date of Birth/Sex: Treating RN: April 15, 1940 (82 y.o. 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 Home Health Home Health Company: - [facility] for wound care. May utilize formulary equivalent dressing for wound treatment orders  unless otherwise specified. Home  Health Nurse may visit PRN to address patients wound care needs. Scheduled days for dressing changes to be completed; exception, patient has scheduled wound care visit that day. **Please direct any NON-WOUND related issues/requests for orders to patient's Primary Care Physician. **If current dressing causes regression in wound condition, may D/C ordered dressing product/s and apply Normal Saline Moist Dressing daily until next Wound Healing Center or Other MD appointment. **Notify Wound Healing Center of regression in wound condition at [phone]. Bathing/ Shower/ Hygiene May shower; gently cleanse wound with antibacterial soap, rinse and pat dry prior to dressing wounds No tub bath. Anesthetic (Use 'Patient Medications' Section for Anesthetic Order Entry) Lidocaine applied to wound bed Edema Control - Lymphedema / Segmental Compressive Device / Other Bilateral Lower Extremities Patient to wear own compression stockings. Remove compression stockings every night before going to bed and put on every morning when getting up. - recommended Elevate, Exercise Daily and [name] void Standing for Long Periods of Time. Elevate leg(s) parallel to the floor when sitting. DO YOUR BEST to sleep in the bed at night. DO NOT sleep in your recliner. Long hours of sitting in [name] recliner leads to swelling of the legs and/or potential wounds on your backside. Wound Treatment Wound #2 - Breast Wound Laterality: Left Cleanser: Byram Ancillary Kit - 15 Day Supply (Generic) 1 x Per Day/15 Days Discharge Instructions: Use supplies as instructed; Kit contains: (15) Saline Bullets; (15) 3x3 Gauze; 15 pr Gloves Cleanser: Soap and Courtney 1 x Per Day/15 Days Discharge Instructions: Gently cleanse wound with antibacterial soap, rinse and pat dry prior to dressing wounds Cleanser: Vashe 5.8 (oz) 1 x Per Day/15 Days Discharge Instructions: Use vashe 5.8 (oz) as directed Prim Dressing: Gauze 1 x Per Day/15 Days ary Discharge  Instructions: lightly pack daily As directed:moistened with Dakins Solution Secondary Dressing: (BORDER) Zetuvit Plus SILICONE BORDER Dressing 5x5 (in/in) (Generic) 1 x Per Day/15 Days Discharge Instructions: Please do not put silicone bordered dressings under wraps. Use non-bordered dressing only. Electronic Signature(s) Signed: 08/23/2022 4:42:29 PM By: Angelina Pih Signed: 08/23/2022 5:04:56 PM By: Allen Derry PA-C Previous Signature: 08/22/2022 4:30:30 PM Version By: Allen Derry PA-C Previous Signature: 08/22/2022 4:40:48 PM Version By: Angelina Pih Entered By: Angelina Pih on 08/23/2022 09:44:13 Courtney [name], Courtney [name] ([id number]) [id number] Page 5 of 11 -------------------------------------------------------------------------------- Problem List Details Patient Name: Date of Service: Courtney [name], Courtney [name] 08/22/2022 8:15 [name] Medical Record Number: [id number] Patient Account Number: [id number] Date of Birth/Sex: Treating RN: [birth year] (82 y.o. 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 lower 08/22/2022 No Yes extremity I73.89 Other specified peripheral vascular diseases 08/22/2022 No Yes I10 Essential (primary) hypertension 08/22/2022 No Yes Inactive Problems Resolved Problems Electronic Signature(s) Signed: 08/22/2022 9:14:10 AM By: Allen Derry PA-C Previous Signature: 08/22/2022 9:13:40 AM Version  By: Allen Derry PA-C Entered By: Allen Derry on 08/22/2022 09:14:09 Courtney [name] ([id number]) [id number] Page 6 of 11 -------------------------------------------------------------------------------- Progress Note Details Patient Name: Date of Service: Courtney [name], Courtney [name] 08/22/2022 8:15 [name] Medical Record Number: [id number]  Patient Account Number: [id number] Date of Birth/Sex: Treating RN: 1940-07-05 (82 y.o. Courtney [name] Primary Care Provider: Bethann Punches Other Clinician: Referring Provider: Treating Provider/Extender: Jasmine Pang in Treatment: 0 Subjective Chief Complaint Information obtained from Patient Left breast surgical ulcer History of Present Illness (HPI) 12-22-2021 upon evaluation today patient appears to be doing well currently in regard to her wound all things considered especially in light of the picture that they showed me from August when she initially had [name] skin tear. Fortunately there does not appear to be any signs of active infection locally nor systemically at this time which is great news and overall I am extremely pleased with the fact that she is making some progress here she has been placed on Cipro due to [name] culture result that was done by rheumatologist which showed that she had Pseudomonas and Staphylococcus aureus noted as the organisms in question. Fortunately I do not think that there is anything that seems to be still present or significant here I think she is actually doing much better which is great news. With that being said the patient tells me that she has been using Medihoney at this point which actually is really good does help to clear up [name] lot of this I do believe. In the beginning she was also placed on or rather in an Unna boot wrap based on what we are being told that was for [name] couple of weeks and then she did not have any additional follow-up with primary eventually rheumatology who were  doing injections for her realize that she had [name] wound they stopped the injections which obviously would lower her immune response and healing chances. That is when she started on the antibiotics and subsequently also was referred to vascular. The good news is from [name] vascular standpoint she seems to be probably sufficient to heal with [name] right ABI of 1.02 and [name] left ABI of 1.02 as well. Her TBI on the right was 0.63 and on the left 0.54. She does have [name] history of diabetes mellitus type 2 and she has an A1c of 6.5 on 10-17-2021. Other than this she also does have chronic venous insufficiency her leg is swollen today I definitely think [name] compression wraps probably can help her she also has hypertension 12-29-2021 upon evaluation patient's wound is actually showing signs of excellent improvement. Fortunately I do not see any signs of infection I think she is making good progress here and overall I do believe that she is really looking quite nice as far as the overall appearance of the wound bed is concerned. There is [name] little bit of necrotic tissue noted on the surface of the wound that is going require debridement today. 12/28; continued improvement in the area on the left lateral leg which was initially [name] skin tear in the setting of chronic venous insufficiency. She has been using Prisma with 3 layer compression. The length of the wound is down 0.5 cm 01-12-2022 upon evaluation today patient appears to be doing well currently in regard to her leg ulcer. She has been tolerating dressing changes without complication. Fortunately I do not see any signs of active infection locally nor systemically which is great news. No fevers, chills, nausea, vomiting, or diarrhea. 01-20-2022 upon evaluation today patient appears to be doing well currently in regard to her wound. In fact this is measuring significantly smaller and I am very pleased with where we stand. I do not see any signs of active  infection locally or  systemically at this time. No fevers, chills, nausea, vomiting, or diarrhea. 02-03-2022 upon evaluation today patient appears to be doing well currently in regard to her wound. In fact she appears to be completely healed based on what I am seeing. Fortunately I do not see any signs of active infection locally nor systemically which is great news. There is little piece of dried skin where the dressing had stuck once I remove this however there was nothing remaining underneath.' Readmission: 08-22-2022 this is an individual whom I am just seen actually at the beginning of the year although she healed this was in regard to her legs and her legs have continued to do quite well although she did have some issues with cellulitis but that was taking care of recently she has not been wearing her compression socks I did have [name] brief conversation with her about the fact that she needs to make sure to wear these on [name] regular basis she voiced understanding. Unfortunately the patient around January which is when I was seeing her of this past year started to have [name] mass noted in her left breast. Subsequent that in December she had had some nipple drainage although she did not pay too much attention to it even with [name] mass noted. By March she had bloody drainage coming out of this and there was the mass actually identified by way of I believe ultrasound at that point. They went in from [name] surgical standpoint on June 10 and remove the mass the good news is this was not cancer the unfortunate news that she is having [name] difficult time getting this to heal following. She has not been told to pack this although she tells me she was put [name] little bit of gauze in herself. Fortunately I do not see any signs of active infection at this point which is good news. She did request [name] referral back to [place] to try to help get this closed as we have done well with her leg. Patient's most recent hemoglobin A1c was on 07-26-2022 and was 7.1 this  is doing pretty well at this point in my opinion. Otherwise her past medical history really has not changed significantly. Patient History Information obtained from Patient, Chart. Allergies acyclovir, codeine, Procardia, Iodinated Contrast Media, Inderide, metrizamide, etodolac Social History Never smoker, Marital Status - Married, Alcohol Use - Never, Drug Use - No History, Caffeine Use - Daily. Medical History Respiratory Patient has history of Sleep Apnea Cardiovascular Patient has history of Hypertension, Peripheral Arterial Disease Endocrine Patient has history of Type II Diabetes Musculoskeletal Patient has history of Rheumatoid Arthritis, Osteoarthritis Neurologic Patient has history of Neuropathy - fingers and feet Courtney [name], Courtney [name] ([id number]) [id number] Page 7 of 11 Patient is treated with Insulin. Blood sugar is tested. Review of Systems (ROS) Constitutional Symptoms (General Health) Denies complaints or symptoms of Fatigue, Fever, Chills, Marked Weight Change. Eyes Denies complaints or symptoms of Dry Eyes, Vision Changes, Glasses / Contacts. Ear/Nose/Mouth/Throat Denies complaints or symptoms of Difficult clearing ears, Sinusitis. Hematologic/Lymphatic Denies complaints or symptoms of Bleeding / Clotting Disorders, Human Immunodeficiency Virus. Gastrointestinal Denies complaints or symptoms of Frequent diarrhea, Nausea, Vomiting. Genitourinary AKI Immunological Complains or has symptoms of Hives. Integumentary (Skin) Complains or has symptoms of Wounds, Swelling. Oncologic skin CA Psychiatric Denies complaints or symptoms of Anxiety, Claustrophobia. Objective Constitutional sitting or standing blood pressure is within target range for patient.. pulse regular and within target range for patient.Marland Kitchen respirations regular, non-labored and within  target range for patient.Marland Kitchen temperature within target range for patient.. Well-nourished and  well-hydrated in no acute distress. Vitals Time Taken: 8:20 AM, Height: 67 in, Source: Stated, Weight: 180 lbs, Source: Stated, BMI: 28.2, Temperature: 97.6 F, Pulse: 80 bpm, Respiratory Rate: 18 breaths/min, Blood Pressure: 140/74 mmHg. Eyes conjunctiva clear no eyelid edema noted. pupils equal round and reactive to light and accommodation. Ears, Nose, Mouth, and Throat no gross abnormality of ear auricles or external auditory canals. normal hearing noted during conversation. mucus membranes moist. Respiratory normal breathing without difficulty. Musculoskeletal normal gait and posture. no significant deformity or arthritic changes, no loss or range of motion, no clubbing. Psychiatric this patient is able to make decisions and demonstrates good insight into disease process. Alert and Oriented x 3. pleasant and cooperative. General Notes: Upon inspection patient's wound bed actually showed signs of having [name] fairly deep wound about 4 cm in depth on the left breast at the 12:00 location in regard to the nipple. With that being said she actually appears to be fairly clean at this point with regard to the wound bed there is some necrotic tissue [name] lot of this is along the edge however and is very attached I think that we will get [name] need to see if we can get this to loosen up [name] little bit I think Dakin's moistened gauze dressings over the next week would probably do good to help to clean this up. She is in agreement with that plan. We do not see how this goes will make any adjustments in care as needed going forward. Patient is in agreement with that plan. Integumentary (Hair, Skin) Wound #2 status is Open. Original cause of wound was Surgical Injury. The date acquired was: 06/19/2022. The wound is located on the Left Breast. The wound measures 1cm length x 2.5cm width x 4cm depth; 1.963cm^2 area and 7.854cm^3 volume. There is Fat Layer (Subcutaneous Tissue) exposed. There is no tunneling or  undermining noted. There is [name] medium amount of serosanguineous drainage noted. There is medium (34-66%) red, pink granulation within the wound bed. There is [name] medium (34-66%) amount of necrotic tissue within the wound bed including Adherent Slough. 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 There was [name] Chemical/Enzymatic/Mechanical debridement performed by Allen Derry, PA-C. With the following instrument(s): saline gauze after achieving pain control using Lidocaine 4% Topical Solution. Other agent used was saline gauze. [name] time out was conducted at 09:25, prior to the start of the procedure. There was no bleeding. The procedure was tolerated well. Post Debridement Measurements: 1cm length x 2.5cm width x 4cm depth; 7.854cm^3 volume. Character of Wound/Ulcer Post Debridement is stable. Post procedure Diagnosis Wound #2: Same as Pre-Procedure Plan Follow-up Appointments: Return Appointment in 1 week. Bathing/ Shower/ Hygiene: May shower; gently cleanse wound with antibacterial soap, rinse and pat dry prior to dressing wounds No tub bath. Anesthetic (Use 'Patient Medications' Section for Anesthetic Order Entry): Lidocaine applied to wound bed Edema Control - Lymphedema / Segmental Compressive Device / Other: Patient to wear own compression stockings. Remove compression stockings  every night before going to bed and put on every morning when  getting up. Elevate, Exercise Daily and Avoid Standing for Long Periods of Time. Elevate leg(s) parallel to the floor when sitting. DO YOUR BEST to sleep in the bed at night. DO NOT sleep in your recliner. Long hours of sitting in [name] recliner leads to swelling of the legs and/or potential wounds on your backside. WOUND #2: - Breast Wound Laterality: Left Cleanser: Byram Ancillary Kit - 15 Day Supply (DME) (Generic) 1 x Per Day/15 Days Discharge Instructions: Use supplies as instructed; Kit contains: (15) Saline Bullets; (15) 3x3 Gauze; 15 pr Gloves Cleanser: Soap and Courtney 1 x Per Day/15 Days Discharge Instructions: Gently cleanse wound with antibacterial soap, rinse and pat dry prior to dressing wounds Cleanser: Vashe 5.8 (oz) 1 x Per Day/15 Days Discharge Instructions: Use vashe 5.8 (oz) as directed Prim Dressing: Gauze 1 x Per Day/15 Days ary Discharge Instructions: lightly pack daily As directed:moistened with Dakins Solution Secondary Dressing: (BORDER) Zetuvit Plus SILICONE BORDER Dressing 5x5 (in/in) (DME) (Generic) 1 x Per Day/15 Days Discharge Instructions: Please do not put silicone bordered dressings under wraps. Use non-bordered dressing only. 1. I am going to recommend that we have the patient going continue [name] rather initiate treatment with the Dakin's moistened gauze dressing starting today and we will have her continue this over the next week and see where things stand. 2. I am going to recommend as well that the patient should continue to monitor for any signs of infection or worsening. Obviously if anything changes she knows contact the office let me know. 3. With regard to debridement we may have to perform some debridement to clear away the necrotic tissue around the edges of the wound next week and want this to soften up little bit more and see what can come out as [name] result of the treatment with the Dakin's first I think the Dakin solution also has the added benefit of  helping to prevent infection. We will see patient back for reevaluation in 1 week here in the clinic. If anything worsens or changes patient will contact our office for additional recommendations. Electronic Signature(s) Signed: 08/22/2022 10:08:22 AM By: Allen Derry PA-C Entered By: Allen Derry on 08/22/2022 10:08:21 -------------------------------------------------------------------------------- ROS/PFSH Details Patient Name: Date of Service: Courtney [name]. 08/22/2022 8:15 [name] Medical Record Number: [id number] Patient Account Number: [id number] Date of Birth/Sex: Treating RN: 1940/11/27 (82 y.o. Courtney [name] Primary Care Provider: Bethann Punches Other Clinician: Referring Provider: Treating Provider/Extender: Jasmine Pang in Treatment: 0 Courtney [name], Courtney [name] ([id number]) [id number] Page 9 of 11 Information Obtained From Patient Chart Constitutional Symptoms (General Health) Complaints and Symptoms: Negative for: Fatigue; Fever; Chills; Marked Weight Change Eyes Complaints and Symptoms: Negative for: Dry Eyes; Vision Changes; Glasses / Contacts Ear/Nose/Mouth/Throat Complaints and Symptoms: Negative for: Difficult clearing ears; Sinusitis Hematologic/Lymphatic Complaints and Symptoms: Negative for: Bleeding / Clotting Disorders; Human Immunodeficiency Virus Gastrointestinal Complaints and Symptoms: Negative for: Frequent diarrhea; Nausea; Vomiting Immunological Complaints and Symptoms: Positive for: Hives Integumentary (Skin) Complaints and Symptoms: Positive for: Wounds; Swelling Psychiatric Complaints and Symptoms: Negative for: Anxiety; Claustrophobia Respiratory Medical History: Positive for: Sleep Apnea Cardiovascular Medical History: Positive for: Hypertension; Peripheral Arterial Disease Endocrine Medical History: Positive for: Type II Diabetes Time with diabetes: 38 Treated with: Insulin Blood sugar  tested every day: Yes Tested : Genitourinary Complaints and Symptoms: Review of System Notes: AKI Musculoskeletal Medical History: Positive for: Rheumatoid Arthritis; Osteoarthritis Neurologic Medical History:  Positive for: Neuropathy - fingers and feet Courtney [name], REANEY [name] ([id number]) [phone].pdf Page 10 of 11 Oncologic Complaints and Symptoms: Review of System Notes: skin CA Immunizations Pneumococcal Vaccine: Received Pneumococcal Vaccination: Yes Received Pneumococcal Vaccination On or After 60th Birthday: Yes Implantable Devices None Family and Social History Never smoker; Marital Status - Married; Alcohol Use: Never; Drug Use: No History; Caffeine Use: Daily Electronic Signature(s) Signed: 08/22/2022 4:30:30 PM By: Allen Derry PA-C Signed: 08/22/2022 4:40:48 PM By: Angelina Pih Entered By: Angelina Pih on 08/22/2022 08:58:56 -------------------------------------------------------------------------------- SuperBill Details Patient Name: Date of Service: Courtney [name]. 08/22/2022 Medical Record Number: [id number] Patient Account Number: [id number] Date of Birth/Sex: Treating RN: 18-Sep-1940 (82 y.o. 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

## 2022-08-22 NOTE — Progress Notes (Signed)
ANETHA, KORST [name] ([id number]) [id number] Page 1 of 9 Visit Report for 08/22/2022 Allergy List Details Patient Name: Date of Service: Courtney [name], Courtney [name] 08/22/2022 8:15 [name] Medical Record Number: [id number] Patient Account Number: [id number] Date of Birth/Sex: Treating RN: 1940/08/24 (82 y.o. [name] Primary Care : Bethann Punches Other Clinician: Referring : Treating /Extender: Jasmine Pang in Treatment: 0 Allergies Active Allergies acyclovir codeine Procardia Iodinated Contrast Media Inderide metrizamide etodolac Allergy Notes Electronic Signature(s) Signed: 08/22/2022 4:40:48 PM By: Angelina Pih Entered By: Angelina Pih on 08/22/2022 08:30:11 -------------------------------------------------------------------------------- Arrival Information Details Patient Name: Date of Service: Courtney Curd [name]. 08/22/2022 8:15 [name] Medical Record Number: [id number] Patient Account Number: [id number] Date of Birth/Sex: Treating RN: 05/31/1940 (82 y.o. [name] Primary Care : Bethann Punches Other Clinician: Referring : Treating /Extender: Jasmine Pang in Treatment: 0 Visit Information Patient Arrived: [place] Arrival Time: 08:20 Courtney [name], Courtney [name] ([id number]) Accompanied By: spouse Transfer Assistance: None Patient Identification Verified: Yes Secondary Verification Process Completed: Yes Patient Has Alerts: Yes Patient Alerts: Patient on Blood Thinner Left breast mass removed History Since Last Visit Added or deleted any medications: No Any new allergies or adverse reactions: No Had [name] fall or experienced change in Had [name] fall or experienced change in activities of daily living that may affect risk of falls: No [id number] Page 2 of 9 No Hospitalized since last visit: No Has Dressing in Place as Prescribed: Yes Pain Present Now:  No Electronic Signature(s) Signed: 08/22/2022 9:52:40 AM By: Angelina Pih Previous Signature: 08/22/2022 9:09:32 AM Version By: Angelina Pih Entered By: Angelina Pih on 08/22/2022 09:52:39 -------------------------------------------------------------------------------- Clinic Level of Care Assessment Details Patient Name: Date of Service: EVEY, MCFADYEN 08/22/2022 8:15 [name] Medical Record Number: [id number] Patient Account Number: [id number] Date of Birth/Sex: Treating RN: [birth year] (82 y.o. [name] Primary Care : Bethann Punches Other Clinician: Referring : Treating /Extender: Jasmine Pang in Treatment: 0 Clinic Level of Care Assessment Items TOOL 2 Quantity Score []  - 0 Use when only an EandM is performed on the INITIAL visit ASSESSMENTS - Nursing Assessment / Reassessment X- 1 20 General Physical Exam (combine w/ comprehensive assessment (listed just below) when performed on new pt. evals) X- 1 25 Comprehensive Assessment (HX, ROS, Risk Assessments, Wounds Hx, etc.) ASSESSMENTS - Wound and Skin [name] ssessment / Reassessment X - Simple Wound Assessment / Reassessment - one wound 1 5 []  - 0 Complex Wound Assessment / Reassessment - multiple wounds []  - 0 Dermatologic / Skin Assessment (not related to wound area) ASSESSMENTS - Ostomy and/or Continence Assessment and Care []  - 0 Incontinence Assessment and Management []  - 0 Ostomy Care Assessment and Management (repouching, etc.) PROCESS - Coordination of Care X - Simple Patient / Family Education for ongoing care 1 15 []  - 0 Complex (extensive) Patient / Family Education for ongoing care X- 1 10 Staff obtains Chiropractor, Records, T Results / Process Orders est []  - 0 Staff telephones HHA, Nursing Homes / Clarify orders / etc []  - 0 Routine Transfer to another Facility (non-emergent condition) []  - 0 Routine Hospital Admission (non-emergent condition) X- 1  15 New Admissions / Manufacturing engineer / Ordering NPWT Apligraf, etc. , []  - 0 Emergency Hospital Admission (emergent condition) X- 1 10 Simple Discharge Coordination []  - 0 Complex (extensive) Discharge Coordination Courtney [name], Courtney [name] ([id number]) [phone].pdf Page 3 of 9 PROCESS - Special Needs []  - 0 Pediatric / Minor Patient  Management []  - 0 Isolation Patient Management []  - 0 Hearing / Language / Visual special needs []  - 0 Assessment of Community assistance (transportation, D/C planning, etc.) []  - 0 Additional assistance / Altered mentation []  - 0 Support Surface(s) Assessment (bed, cushion, seat, etc.) INTERVENTIONS - Wound Cleansing / Measurement X- 1 5 Wound Imaging (photographs - any number of wounds) []  - 0 Wound Tracing (instead of photographs) X- 1 5 Simple Wound Measurement - one wound []  - 0 Complex Wound Measurement - multiple wounds X- 1 5 Simple Wound Cleansing - one wound []  - 0 Complex Wound Cleansing - multiple wounds INTERVENTIONS - Wound Dressings X - Small Wound Dressing one or multiple wounds 1 10 []  - 0 Medium Wound Dressing one or multiple wounds []  - 0 Large Wound Dressing one or multiple wounds []  - 0 Application of Medications - injection INTERVENTIONS - Miscellaneous []  - 0 External ear exam []  - 0 Specimen Collection (cultures, biopsies, blood, body fluids, etc.) []  - 0 Specimen(s) / Culture(s) sent or taken to Lab for analysis []  - 0 Patient Transfer (multiple staff / Nurse, adult / Similar devices) []  - 0 Simple Staple / Suture removal (25 or less) []  - 0 Complex Staple / Suture removal (26 or more) []  - 0 Hypo / Hyperglycemic Management (close monitor of Blood Glucose) []  - 0 Ankle / Brachial Index (ABI) - do not check if billed separately Has the patient been seen at the hospital within the last three years: Yes Total Score: 125 Level Of Care: New/Established - Level 4 Electronic  Signature(s) Signed: 08/22/2022 4:40:48 PM By: Angelina Pih Entered By: Angelina Pih on 08/22/2022 09:26:52 -------------------------------------------------------------------------------- Encounter Discharge Information Details Patient Name: Date of Service: Courtney Curd [name]. 08/22/2022 8:15 [name] Medical Record Number: [id number] Patient Account Number: [id number] Date of Birth/Sex: Treating RN: 1940/10/15 (82 y.o. [name] Primary Care : Bethann Punches Other Clinician: Referring : Treating /Extender: Jennilyn, Eliades [name] ([id number]) [id number] Page 4 of 9 Weeks in Treatment: 0 Encounter Discharge Information Items Post Procedure Vitals Discharge Condition: Stable Temperature (F): 97.6 Ambulatory Status: Cane Pulse (bpm): 80 Discharge Destination: Home Respiratory Rate (breaths/min): 18 Transportation: Private Auto Blood Pressure (mmHg): 140/74 Accompanied By: husband Schedule Follow-up Appointment: Yes Clinical Summary of Care: Electronic Signature(s) Signed: 08/22/2022 9:54:14 AM By: Angelina Pih Entered By: Angelina Pih on 08/22/2022 09:54:14 -------------------------------------------------------------------------------- Lower Extremity Assessment Details Patient Name: Date of Service: AVANI, ASH 08/22/2022 8:15 [name] Medical Record Number: [id number] Patient Account Number: [id number] Date of Birth/Sex: Treating RN: [birth year] (82 y.o. [name] Primary Care : Bethann Punches Other Clinician: Referring : Treating /Extender: Jasmine Pang in Treatment: 0 Electronic Signature(s) Signed: 08/22/2022 4:40:48 PM By: Angelina Pih Entered By: Angelina Pih on 08/22/2022 40:10:27 -------------------------------------------------------------------------------- Multi Wound Chart Details Patient Name: Date of Service: Courtney Curd [name]. 08/22/2022 8:15 [name] Medical Record Number: [id number] Patient Account Number: [id number] Date of Birth/Sex: Treating RN: 05-09-1940 (82 y.o. [name] Primary Care : Bethann Punches Other Clinician: Referring : Treating /Extender: Jasmine Pang in Treatment: 0 Vital Signs Height(in): 67 Pulse(bpm): 80 Weight(lbs): 180 Blood Pressure(mmHg): 140/74 Body Mass Index(BMI): 28.2 Temperature(F): 97.6 Respiratory Rate(breaths/min): 18 [Murdaugh, Jaelin [name] ([id number]):Photos:] [phone].pdf Page 5 of 9:2 N/[name] N/[name] N/[name] N/[name]] Left Breast N/[name] N/[name] Wound Location: Surgical Injury N/[name] N/[name] Wounding Event: Open Surgical Wound N/[name] N/[name] Primary Etiology: Sleep Apnea, Hypertension, Peripheral N/[name] N/[name] Comorbid History:  Arterial Disease, Type II Diabetes, Rheumatoid Arthritis, Neuropathy 06/19/2022 N/[name] N/[name] Date Acquired: 0 N/[name] N/[name] Weeks of Treatment: Open N/[name] N/[name] Wound Status: No N/[name] N/[name] Wound Recurrence: 1x2.5x4 N/[name] N/[name] Measurements L x W x D (cm) 1.963 N/[name] N/[name] [name] (cm) : rea 7.854 N/[name] N/[name] Volume (cm) : Full Thickness With Exposed Support N/[name] N/[name] Classification: Structures Medium N/[name] N/[name] Exudate [name] mount: Serosanguineous N/[name] N/[name] Exudate Type: red, brown N/[name] N/[name] Exudate Color: Medium (34-66%) N/[name] N/[name] Granulation [name] mount: Red, Pink N/[name] N/[name] Granulation Quality: Medium (34-66%) N/[name] N/[name] Necrotic [name] mount: Fat Layer (Subcutaneous Tissue): Yes N/[name] N/[name] Exposed Structures: None N/[name] N/[name] Epithelialization: Treatment Notes Electronic Signature(s) Signed: 08/22/2022 4:40:48 PM By: Angelina Pih Entered By: Angelina Pih on 08/22/2022 09:20:42 -------------------------------------------------------------------------------- Multi-Disciplinary Care Plan Details Patient Name: Date of Service: Courtney Curd [name]. 08/22/2022 8:15 [name] Medical Record Number: [id number] Patient Account Number: [id number] Date of  Birth/Sex: Treating RN: May 01, 1940 (82 y.o. [name] Primary Care : Bethann Punches Other Clinician: Referring : Treating /Extender: Jasmine Pang in Treatment: 0 Active Inactive Necrotic Tissue Nursing Diagnoses: Impaired tissue integrity related to necrotic/devitalized tissue Goals: Necrotic/devitalized tissue will be minimized in the wound bed Date Initiated: 08/22/2022 Target Resolution Date: 10/17/2022 Goal Status: Active Patient/caregiver will verbalize understanding of reason and process for debridement of necrotic tissue Courtney [name], Courtney [name] ([id number]) [phone].pdf Page 6 of 9 Date Initiated: 08/22/2022 Target Resolution Date: 08/29/2022 Goal Status: Active Interventions: Assess patient pain level pre-, during and post procedure and prior to discharge Provide education on necrotic tissue and debridement process Treatment Activities: Apply topical anesthetic as ordered : 08/22/2022 Notes: Wound/Skin Impairment Nursing Diagnoses: Impaired tissue integrity Knowledge deficit related to ulceration/compromised skin integrity Goals: Ulcer/skin breakdown will have [name] volume reduction of 30% by week 4 Date Initiated: 08/22/2022 Target Resolution Date: 09/19/2022 Goal Status: Active Ulcer/skin breakdown will have [name] volume reduction of 50% by week 8 Date Initiated: 08/22/2022 Target Resolution Date: 10/17/2022 Goal Status: Active Ulcer/skin breakdown will have [name] volume reduction of 80% by week 12 Date Initiated: 08/22/2022 Target Resolution Date: 11/14/2022 Goal Status: Active Ulcer/skin breakdown will heal within 14 weeks Date Initiated: 08/22/2022 Target Resolution Date: 11/28/2022 Goal Status: Active Interventions: Assess patient/caregiver ability to obtain necessary supplies Assess patient/caregiver ability to perform ulcer/skin care regimen upon admission and as needed Assess ulceration(s) every  visit Provide education on ulcer and skin care Treatment Activities: Skin care regimen initiated : 08/22/2022 Notes: Electronic Signature(s) Signed: 08/22/2022 4:40:48 PM By: Angelina Pih Entered By: Angelina Pih on 08/22/2022 09:28:44 -------------------------------------------------------------------------------- Pain Assessment Details Patient Name: Date of Service: Courtney Curd [name]. 08/22/2022 8:15 [name] Medical Record Number: [id number] Patient Account Number: [id number] Date of Birth/Sex: Treating RN: 1940/06/21 (82 y.o. [name] Primary Care : Bethann Punches Other Clinician: Referring : Treating /Extender: Jasmine Pang in Treatment: 0 Active Problems Location of Pain Severity and Description of Pain Patient Has Paino No Site Locations Rate the pain. Courtney [name], Courtney [name] ([id number]) [id number] Page 7 of 9 Rate the pain. Current Pain Level: 0 Pain Management and Medication Current Pain Management: Electronic Signature(s) Signed: 08/22/2022 4:40:48 PM By: Angelina Pih Entered By: Angelina Pih on 08/22/2022 08:20:43 -------------------------------------------------------------------------------- Patient/Caregiver Education Details Patient Name: Date of Service: Courtney [name] 8/13/2024andnbsp8:15 [name] Medical Record Number: [id number] Patient Account Number: [id number] Date of Birth/Gender: Treating RN: December 10, 1940 (82 y.o. [name] Primary Care Physician: Bethann Punches Other Clinician: Referring Physician: Treating Physician/Extender: Allen Derry  Debbra Riding Weeks in Treatment: 0 Education Assessment Education Provided To: Patient Education Topics Provided Welcome T [facility]New [facility] Packet: o Handouts: [facility] P[facility] form, Welcome T [facility] o Methods: Explain/Verbal Responses: State content correctly Wound/Skin  Impairment: Handouts: Caring for Your Ulcer Methods: Explain/Verbal Responses: State content correctly Electronic Signature(s) Signed: 08/22/2022 4:40:48 PM By: Angelina Pih Entered By: Angelina Pih on 08/22/2022 09:29:02 Courtney [name], Courtney [name] ([id number]) [id number] Page 8 of 9 -------------------------------------------------------------------------------- Wound Assessment Details Patient Name: Date of Service: Courtney [name], Courtney [name] 08/22/2022 8:15 [name] Medical Record Number: [id number] Patient Account Number: [id number] Date of Birth/Sex: Treating RN: November 16, 1940 (82 y.o. [name] Primary Care : Bethann Punches Other Clinician: Referring : Treating /Extender: Jasmine Pang in Treatment: 0 Wound Status Wound Number: 2 Primary Open Surgical Wound Etiology: Wound Location: Left Breast Wound Open Wounding Event: Surgical Injury Status: Date Acquired: 06/19/2022 Comorbid Sleep Apnea, Hypertension, Peripheral Arterial Disease, Type II Weeks Of Treatment: 0 History: Diabetes, Rheumatoid Arthritis, Neuropathy Clustered Wound: No Photos Wound Measurements Length: (cm) 1 Width: (cm) 2.5 Depth: (cm) 4 Area: (cm) 1.963 Volume: (cm) 7.854 % Reduction in Area: % Reduction in Volume: Epithelialization: None Tunneling: No Undermining: No Wound Description Classification: Full Thickness With Exposed Support Structures Exudate Amount: Medium Exudate Type: Serosanguineous Exudate Color: red, brown Foul Odor After Cleansing: No Slough/Fibrino Yes Wound Bed Granulation Amount: Medium (34-66%) Exposed Structure Granulation Quality: Red, Pink Fat Layer (Subcutaneous Tissue) Exposed: Yes Necrotic Amount: Medium (34-66%) Necrotic Quality: Adherent Slough Treatment Notes Wound #2 (Breast) Wound Laterality: Left Cleanser [employer]ram Ancillary Kit - 15 Day Supply Discharge Instruction: Use supplies as instructed; Kit  contains: (15) Saline Bullets; (15) 3x3 Gauze; 15 pr Gloves Soap and Water Discharge Instruction: Gently cleanse wound with antibacterial soap, rinse and pat dry prior to dressing wounds Vashe 5.8 (oz) Discharge Instruction: Use vashe 5.8 (oz) as directed Courtney [name], Courtney [name] ([id number]) [id number].pdf Page 9 of 9 Peri-Wound Care Topical Primary Dressing Gauze Discharge Instruction: lightly pack daily As directed:moistened with Dakins Solution Secondary Dressing (BORDER) Zetuvit Plus SILICONE BORDER Dressing 5x5 (in/in) Discharge Instruction: Please do not put silicone bordered dressings under wraps. Use non-bordered dressing only. Secured With Compression Wrap Compression Stockings Facilities manager) Signed: 08/22/2022 4:40:48 PM By: Angelina Pih Entered By: Angelina Pih on 08/22/2022 08:51:20 -------------------------------------------------------------------------------- Vitals Details Patient Name: Date of Service: Courtney Curd [name]. 08/22/2022 8:15 [name] Medical Record Number: [id number] Patient Account Number: [id number] Date of Birth/Sex: Treating RN: 13-Dec-1940 (82 y.o. [name] Primary Care : Bethann Punches Other Clinician: Referring : Treating /Extender: Jasmine Pang in Treatment: 0 Vital Signs Time Taken: 08:20 Temperature (F): 97.6 Height (in): 67 Pulse (bpm): 80 Source: Stated Respiratory Rate (breaths/min): 18 Weight (lbs): 180 Blood Pressure (mmHg): 140/74 Source: Stated Reference Range: 80 - 120 mg / dl Body Mass Index (BMI): 28.2 Electronic Signature(s) Signed: 08/22/2022 4:40:48 PM By: Angelina Pih Entered By: Angelina Pih on 08/22/2022 08:27:42

## 2022-08-22 NOTE — Progress Notes (Signed)
VERBAL, HOLSTEN [name] ([id number]) [phone] [id number] Page 1 of 5 Visit Report for 08/22/2022 Abuse Risk Screen Details Patient Name: Date of Service: Courtney [name], Courtney [name] 08/22/2022 8:15 [name] Medical Record Number: [id number] Patient Account Number: [id number] Date of Birth/Sex: Treating RN: 13-Mar-1940 (82 y.o. Courtney [name] Primary Care : Bethann Punches Other Clinician: Referring : Treating /Extender: Jasmine Pang in Treatment: 0 Abuse Risk Screen Items Answer ABUSE RISK SCREEN: Has anyone close to you tried to hurt or harm you recentlyo No Do you feel uncomfortable with anyone in your familyo No Has anyone forced you do things that you didnt want to doo No Electronic Signature(s) Signed: 08/22/2022 4:40:48 PM By: Angelina Pih Entered By: Angelina Pih on 08/22/2022 08:34:28 -------------------------------------------------------------------------------- Activities of Daily Living Details Patient Name: Date of Service: Courtney [name], Courtney [name] 08/22/2022 8:15 [name] Medical Record Number: [id number] Patient Account Number: [id number] Date of Birth/Sex: Treating RN: [birth year] (82 y.o. Courtney [name] Primary Care : Bethann Punches Other Clinician: Referring : Treating /Extender: Jasmine Pang in Treatment: 0 Activities of Daily Living Items Answer Activities of Daily Living (Please select one for each item) Drive Automobile Not Able T Medications ake Completely Able Use T elephone Completely Able Care for Appearance Completely Able Use T oilet Completely Able Bath / Shower Completely Able Dress Self Completely Able Feed Self Completely Able Walk Need Assistance Get In / Out Bed Completely Able Housework Need Assistance Courtney [name], Courtney [name] ([id number]) [phone] [id number] Page 2 of 5 Prepare Meals Need Assistance Handle Money Need Assistance Shop  for Self Need Assistance Electronic Signature(s) Signed: 08/22/2022 4:40:48 PM By: Angelina Pih Entered By: Angelina Pih on 08/22/2022 08:35:02 -------------------------------------------------------------------------------- Education Screening Details Patient Name: Date of Service: Courtney Curd [name]. 08/22/2022 8:15 [name] Medical Record Number: [id number] Patient Account Number: [id number] Date of Birth/Sex: Treating RN: 09-Mar-1940 (82 y.o. Courtney [name] Primary Care : Bethann Punches Other Clinician: Referring : Treating /Extender: Jasmine Pang in Treatment: 0 Primary Learner Assessed: Patient Learning Preferences/Education Level/Primary Language Learning Preference: Explanation, Demonstration, Video, Communication Board, Printed Material Preferred Language: English Cognitive Barrier Language Barrier: No Translator Needed: No Memory Deficit: No Emotional Barrier: No Cultural/Religious Beliefs Affecting Medical Care: No Physical Barrier Impaired Vision: No Impaired Hearing: No Decreased Hand dexterity: No Knowledge/Comprehension Knowledge Level: High Comprehension Level: High Ability to understand written instructions: High Ability to understand verbal instructions: High Motivation Anxiety Level: Calm Cooperation: Cooperative Education Importance: Acknowledges Need Interest in Health Problems: Asks Questions Perception: Coherent Willingness to Engage in Self-Management High Activities: Readiness to Engage in Self-Management High Activities: Electronic Signature(s) Signed: 08/22/2022 4:40:48 PM By: Angelina Pih Entered By: Angelina Pih on 08/22/2022 08:35:22 Courtney [name], Courtney [name] ([id number]) [id number] Page 3 of 5 -------------------------------------------------------------------------------- Fall Risk Assessment Details Patient Name: Date of Service: Courtney [name], Courtney [name] 08/22/2022  8:15 [name] Medical Record Number: [id number] Patient Account Number: [id number] Date of Birth/Sex: Treating RN: 20-Aug-1940 (82 y.o. Courtney [name] Primary Care : Bethann Punches Other Clinician: Referring : Treating /Extender: Jasmine Pang in Treatment: 0 Fall Risk Assessment Items Have you had 2 or more falls in the last 12 monthso 0 No Have you had any fall that resulted in injury in the last 12 monthso 0 No FALLS RISK SCREEN History of falling - immediate or within 3 months 0 No Secondary diagnosis (Do you have 2 or more medical diagnoseso) 0 No Ambulatory aid None/bed rest/wheelchair/nurse 0 No Crutches/cane/walker  15 Yes Furniture 0 No Intravenous therapy Access/Saline/Heparin Lock 0 No Gait/Transferring Normal/ bed rest/ wheelchair 0 Yes Weak (short steps with or without shuffle, stooped but able to lift head while walking, may seek 0 No support from furniture) Impaired (short steps with shuffle, may have difficulty arising from chair, head down, impaired 0 No balance) Mental Status Oriented to own ability 0 Yes Electronic Signature(s) Signed: 08/22/2022 4:40:48 PM By: Angelina Pih Entered By: Angelina Pih on 08/22/2022 08:35:37 -------------------------------------------------------------------------------- Foot Assessment Details Patient Name: Date of Service: Courtney Curd [name]. 08/22/2022 8:15 [name] Medical Record Number: [id number] Patient Account Number: [id number] Date of Birth/Sex: Treating RN: 09/14/1940 (82 y.o. Courtney [name] Primary Care : Bethann Punches Other Clinician: Referring : Treating /Extender: Jasmine Pang in Treatment: 0 Foot Assessment Items Site Locations [place] [name] ([id number]) [id number] Page 4 of 5 + = Sensation present, - = Sensation absent, C = Callus, U = Ulcer R = Redness, W = Warmth, M = Maceration, PU =  Pre-ulcerative lesion F = Fissure, S = Swelling, D = Dryness Assessment Right: Left: Other Deformity: No No Prior Foot Ulcer: No No Prior Amputation: No No Charcot Joint: No No Ambulatory Status: Ambulatory With Help Assistance Device: Cane Gait: Steady Notes wound is on patients left breast. Electronic Signature(s) Signed: 08/22/2022 4:40:48 PM By: Angelina Pih Entered By: Angelina Pih on 08/22/2022 08:36:09 -------------------------------------------------------------------------------- Nutrition Risk Screening Details Patient Name: Date of Service: Courtney [name], Courtney [name] 08/22/2022 8:15 [name] Medical Record Number: [id number] Patient Account Number: [id number] Date of Birth/Sex: Treating RN: 08/18/1940 (82 y.o. Courtney [name] Primary Care : Bethann Punches Other Clinician: Referring : Treating /Extender: Jasmine Pang in Treatment: 0 Height (in): 67 Weight (lbs): 180 Body Mass Index (BMI): 28.2 Nutrition Risk Screening Items Score Screening NUTRITION RISK SCREEN: I have an illness or condition that made me change the kind and/or amount of food I eat 0 No Courtney [name], Courtney [name] ([id number]) [id number] Page 5 of 5 I eat fewer than two meals per day 0 No I eat few fruits and vegetables, or milk products 0 No I have three or more drinks of beer, liquor or wine almost every day 0 No I have tooth or mouth problems that make it hard for me to eat 0 No I don't always have enough money to buy the food I need 0 No I eat alone most of the time 0 No I take three or more different prescribed or over-the-counter drugs [name] day 0 No Without wanting to, I have lost or gained 10 pounds in the last six months 0 No I am not always physically able to shop, cook and/or feed myself 0 No Nutrition Protocols Good Risk Protocol 0 No interventions needed Moderate Risk Protocol High Risk Proctocol Risk Level: Good Risk Score:  0 Electronic Signature(s) Signed: 08/22/2022 4:40:48 PM By: Angelina Pih Entered By: Angelina Pih on 08/22/2022 08:35:48

## 2022-08-29 ENCOUNTER — Encounter: Payer: Medicare Other | Admitting: Physician Assistant

## 2022-08-29 DIAGNOSIS — E11622 Type 2 diabetes mellitus with other skin ulcer: Secondary | ICD-10-CM | POA: Diagnosis not present

## 2022-08-29 NOTE — Progress Notes (Addendum)
CHANCEY, FOLKER [name] ([id number]) [id number] Page 1 of 8 Visit Report for 08/29/2022 Chief Complaint Document Details Patient Name: Date of Service: Courtney [name] 08/29/2022 1:00 PM Medical Record Number: [id number] Patient Account Number: [id number] Date of Birth/Sex: Treating RN: 1940/06/15 (82 y.o. Courtney [name] Primary Care Provider: Bethann Punches Other Clinician: Referring Provider: Treating Provider/Extender: Margorie John in Treatment: 1 Information Obtained from: Patient Chief Complaint Left breast surgical ulcer Electronic Signature(s) Signed: 08/29/2022 1:07:28 PM By: Allen Derry PA-C Entered By: Allen Derry on 08/29/2022 13:07:28 -------------------------------------------------------------------------------- Debridement Details Patient Name: Date of Service: Courtney [name]. 08/29/2022 1:00 PM Medical Record Number: [id number] Patient Account Number: [id number] Date of Birth/Sex: Treating RN: [birth year] (82 y.o. Courtney [name] Primary Care Provider: Bethann Punches Other Clinician: Referring Provider: Treating Provider/Extender: Margorie John in Treatment: 1 Debridement Performed for Assessment: Wound #2 Left Breast Performed By: Physician Allen Derry, PA-C Debridement Type: Debridement Level of Consciousness (Pre-procedure): Awake and Alert Pre-procedure Verification/Time Out Yes - 13:37 Taken: Start Time: 13:37 Pain Control: Lidocaine 2% T opical Gel Percent of Wound Bed Debrided: 10% T Area Debrided (cm): otal 0.2 Tissue and other material debrided: Non-Viable, Slough, Slough Level: Non-Viable Tissue Debridement Description: Selective/Open Wound Instrument: Forceps, Scissors Bleeding: None End Time: 13:38 Procedural Pain: 0 Post Procedural Pain: 0 Response to Treatment: Procedure was tolerated well Courtney [name], Courtney [name] ([id number]) [id number] Page 2 of 8 Level of  Consciousness (Post- Awake and Alert procedure): Post Debridement Measurements of Total Wound Length: (cm) 1 Width: (cm) 2.5 Depth: (cm) 2.5 Volume: (cm) 4.909 Character of Wound/Ulcer Post Debridement: Improved Post Procedure Diagnosis Same as Pre-procedure Electronic Signature(s) Signed: 08/29/2022 6:45:54 PM By: Allen Derry PA-C Signed: 09/06/2022 5:16:58 PM By: Bonnell Public Entered By: Bonnell Public on 08/29/2022 13:39:20 -------------------------------------------------------------------------------- HPI Details Patient Name: Date of Service: Courtney [name]. 08/29/2022 1:00 PM Medical Record Number: [id number] Patient Account Number: [id number] Date of Birth/Sex: Treating RN: 04/04/1940 (82 y.o. Courtney [name] Primary Care Provider: Bethann Punches Other Clinician: Referring Provider: Treating Provider/Extender: Margorie John in Treatment: 1 History of Present Illness HPI Description: 12-22-2021 upon evaluation today patient appears to be doing well currently in regard to her wound all things considered especially in light of the picture that they showed me from August when she initially had [name] skin tear. Fortunately there does not appear to be any signs of active infection locally nor systemically at this time which is great news and overall I am extremely pleased with the fact that she is making some progress here she has been placed on Cipro due to [name] culture result that was done by rheumatologist which showed that she had Pseudomonas and Staphylococcus aureus noted as the organisms in question. Fortunately I do not think that there is anything that seems to be still present or significant here I think she is actually doing much better which is great news. With that being said the patient tells me that she has been using Medihoney at this point which actually is really good does help to clear up [name] lot of this I do believe. In the beginning she was also placed on  or rather in an Unna boot wrap based on what we are being told that was for [name] couple of weeks and then she did not have any additional follow-up with primary eventually rheumatology who were doing injections for her realize that she had [name] wound they stopped the injections which obviously  would lower her immune response and healing chances. That is when she started on the antibiotics and subsequently also was referred to vascular. The good news is from [name] vascular standpoint she seems to be probably sufficient to heal with [name] right ABI of 1.02 and [name] left ABI of 1.02 as well. Her TBI on the right was 0.63 and on the left 0.54. She does have [name] history of diabetes mellitus type 2 and she has an A1c of 6.5 on 10-17-2021. Other than this she also does have chronic venous insufficiency her leg is swollen today I definitely think [name] compression wraps probably can help her she also has hypertension 12-29-2021 upon evaluation patient's wound is actually showing signs of excellent improvement. Fortunately I do not see any signs of infection I think she is making good progress here and overall I do believe that she is really looking quite nice as far as the overall appearance of the wound bed is concerned. There is [name] little bit of necrotic tissue noted on the surface of the wound that is going require debridement today. 12/28; continued improvement in the area on the left lateral leg which was initially [name] skin tear in the setting of chronic venous insufficiency. She has been using Prisma with 3 layer compression. The length of the wound is down 0.5 cm 01-12-2022 upon evaluation today patient appears to be doing well currently in regard to her leg ulcer. She has been tolerating dressing changes without complication. Fortunately I do not see any signs of active infection locally nor systemically which is great news. No fevers, chills, nausea, vomiting, or diarrhea. 01-20-2022 upon evaluation today patient appears to be  doing well currently in regard to her wound. In fact this is measuring significantly smaller and I am very pleased with where we stand. I do not see any signs of active infection locally or systemically at this time. No fevers, chills, nausea, vomiting, or diarrhea. 02-03-2022 upon evaluation today patient appears to be doing well currently in regard to her wound. In fact she appears to be completely healed based on what I am seeing. Fortunately I do not see any signs of active infection locally nor systemically which is great news. There is little piece of dried skin where the dressing had stuck once I remove this however there was nothing remaining underneath.' Readmission: 08-22-2022 this is an individual whom I am just seen actually at the beginning of the year although she healed this was in regard to her legs and her legs have continued to do quite well although she did have some issues with cellulitis but that was taking care of recently she has not been wearing her compression BRANDON, MACKIEWICZ [name] ([id number]) [id number] Page 3 of 8 socks I did have [name] brief conversation with her about the fact that she needs to make sure to wear these on [name] regular basis she voiced understanding. Unfortunately the patient around January which is when I was seeing her of this past year started to have [name] mass noted in her left breast. Subsequent that in December she had had some nipple drainage although she did not pay too much attention to it even with [name] mass noted. By March she had bloody drainage coming out of this and there was the mass actually identified by way of I believe ultrasound at that point. They went in from [name] surgical standpoint on June 10 and remove the mass the good news is this was not cancer the unfortunate news  that she is having [name] difficult time getting this to heal following. She has not been told to pack this although she tells me she was put [name] little bit of gauze in  herself. Fortunately I do not see any signs of active infection at this point which is good news. She did request [name] referral back to [place] to try to help get this closed as we have done well with her leg. Patient's most recent hemoglobin A1c was on 07-26-2022 and was 7.1 this is doing pretty well at this point in my opinion. Otherwise her past medical history really has not changed significantly. 08-29-2022 upon evaluation today patient appears to be doing pretty well currently in regard to the wound currently on the left breast location. She has been tolerating the dressing changes without complication and fortunately this actually seems to be much less deep compared to last week's evaluation. I am extremely pleased with where we stand today and I do not see any signs of active infection at this time. Electronic Signature(s) Signed: 08/29/2022 6:15:12 PM By: Allen Derry PA-C Entered By: Allen Derry on 08/29/2022 18:15:12 -------------------------------------------------------------------------------- Physical Exam Details Patient Name: Date of Service: Courtney [name], Courtney [name] 08/29/2022 1:00 PM Medical Record Number: [id number] Patient Account Number: [id number] Date of Birth/Sex: Treating RN: 1940-07-18 (82 y.o. Courtney [name] Primary Care Provider: Bethann Punches Other Clinician: Referring Provider: Treating Provider/Extender: Margorie John in Treatment: 1 Constitutional Well-nourished and well-hydrated in no acute distress. Respiratory normal breathing without difficulty. Psychiatric this patient is able to make decisions and demonstrates good insight into disease process. Alert and Oriented x 3. pleasant and cooperative. Notes Upon inspection patient's wound bed actually does have some necrotic debris that is loose enough I believe it actually clear away today and I discussed that with her at this point. She is in agreement with me trending this way. I think that the Dakin's  moistened gauze packing is actually doing quite well to help fill this in very well at this point. Electronic Signature(s) Signed: 08/29/2022 6:15:41 PM By: Allen Derry PA-C Entered By: Allen Derry on 08/29/2022 18:15:41 Physician Orders Details -------------------------------------------------------------------------------- Courtney [name] ([id number]) [id number] Page 4 of 8 Patient Name: Date of Service: KAHMIYA, MCMULLIN 08/29/2022 1:00 PM Medical Record Number: [id number] Patient Account Number: [id number] Date of Birth/Sex: Treating RN: 07/21/1940 (82 y.o. 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 Home Health Home Health Company: - [facility] for wound care. May utilize formulary equivalent dressing for wound treatment orders unless otherwise specified. Home Health Nurse may visit PRN to address patients wound care needs. Scheduled days for dressing changes to be completed; exception, patient has scheduled wound care visit that day. **Please direct any NON-WOUND related issues/requests for orders to patient's Primary Care Physician. **If current dressing causes regression in wound condition, may D/C ordered dressing product/s and apply Normal Saline Moist Dressing daily until  next Wound Healing Center or Other MD appointment. **Notify Wound  Healing Center of regression in wound condition at [phone]. Bathing/ Shower/ Hygiene May shower; gently cleanse wound with antibacterial soap, rinse and pat dry prior to dressing wounds No tub bath. Anesthetic (Use 'Patient Medications' Section for Anesthetic Order Entry) Lidocaine applied to wound bed Wound Treatment Wound #2 - Breast Wound Laterality: Left Cleanser: Byram Ancillary Kit - 15 Day Supply (Generic) 1 x Per Day/15 Days Discharge Instructions: Use supplies as instructed; Kit contains: (15) Saline Bullets; (15) 3x3 Gauze; 15 pr Gloves Cleanser: Soap and Water 1 x Per Day/15 Days Discharge Instructions: Gently cleanse wound with antibacterial soap, rinse and pat dry prior to dressing wounds Prim Dressing: Gauze 1 x Per Day/15 Days ary Discharge Instructions: lightly pack daily As directed:moistened with Dakins Solution Secondary Dressing: (BORDER) Zetuvit Plus SILICONE BORDER Dressing 5x5 (in/in) (Generic) 1 x Per Day/15 Days Discharge Instructions: Please do not put silicone bordered dressings under wraps. Use non-bordered dressing only. Electronic Signature(s) Signed: 08/29/2022 6:45:54 PM By: Allen Derry PA-C Signed: 09/06/2022 5:16:58 PM By: Bonnell Public Entered By: Bonnell Public on 08/29/2022 13:47:10 Problem List Details -------------------------------------------------------------------------------- Courtney [name] ([id number]) [id number] Page 5 of 8 Patient Name: Date of Service: Courtney [name], Courtney [name] 08/29/2022 1:00 PM Medical Record Number: [id number] Patient Account Number: [id number] Date of Birth/Sex: Treating RN: 1940-11-17 (82 y.o. 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 08/29/2022 1:00 PM Medical Record Number: [id number] Patient Account Number: [id number] Date of Birth/Sex: Treating RN: 1940/12/10 (82 y.o. Courtney [name] Primary Care Provider: Bethann Punches Other Clinician: Referring Provider: Treating Provider/Extender: Margorie John in Treatment: 1 Subjective Chief Complaint Information obtained from Patient Left breast surgical ulcer Courtney [name], Courtney [name] ([id number]) [id number] Page 6 of 8 History of Present Illness (HPI) 12-22-2021 upon evaluation today patient appears to be doing well currently in regard to her wound all things considered especially in light of the picture that they showed me from August when she initially had [name] skin tear. Fortunately there does not appear to be any signs of active infection locally nor systemically at this time which is great news and overall I am extremely pleased with the fact that she is making some progress here she has been placed on Cipro due to [name] culture result that was done by rheumatologist which showed that she had Pseudomonas and  Staphylococcus aureus noted as the organisms in question. Fortunately I do not think that there is anything that seems to be still present or significant here I think she is actually doing much better which is great news. With that being said the patient tells me that she has been using Medihoney at this point which actually is really good does help to clear up [name] lot of this  I do believe. In the beginning she was also placed on or rather in an Unna boot wrap based on what we are being told that was for [name] couple of weeks and then she did not have any additional follow-up with primary eventually rheumatology who were doing injections for her realize that she had [name] wound they stopped the injections which obviously would lower her immune response and healing chances. That is when she started on the antibiotics and subsequently also was referred to vascular. The good news is from [name] vascular standpoint she seems to be probably sufficient to heal with [name] right ABI of 1.02 and [name] left ABI of 1.02 as well. Her TBI on the right was 0.63 and on the left 0.54. She does have [name] history of diabetes mellitus type 2 and she has an A1c of 6.5 on 10-17-2021. Other than this she also does have chronic venous insufficiency her leg is swollen today I definitely think [name] compression wraps probably can help her she also has hypertension 12-29-2021 upon evaluation patient's wound is actually showing signs of excellent improvement. Fortunately I do not see any signs of infection I think she is making good progress here and overall I do believe that she is really looking quite nice as far as the overall appearance of the wound bed is concerned. There is [name] little bit of necrotic tissue noted on the surface of the wound that is going require debridement today. 12/28; continued improvement in the area on the left lateral leg which was initially [name] skin tear in the setting of chronic venous insufficiency. She has been using Prisma with 3  layer compression. The length of the wound is down 0.5 cm 01-12-2022 upon evaluation today patient appears to be doing well currently in regard to her leg ulcer. She has been tolerating dressing changes without complication. Fortunately I do not see any signs of active infection locally nor systemically which is great news. No fevers, chills, nausea, vomiting, or diarrhea. 01-20-2022 upon evaluation today patient appears to be doing well currently in regard to her wound. In fact this is measuring significantly smaller and I am very pleased with where we stand. I do not see any signs of active infection locally or systemically at this time. No fevers, chills, nausea, vomiting, or diarrhea. 02-03-2022 upon evaluation today patient appears to be doing well currently in regard to her wound. In fact she appears to be completely healed based on what I am seeing. Fortunately I do not see any signs of active infection locally nor systemically which is great news. There is little piece of dried skin where the dressing had stuck once I remove this however there was nothing remaining underneath.' Readmission: 08-22-2022 this is an individual whom I am just seen actually at the beginning of the year although she healed this was in regard to her legs and her legs have continued to do quite well although she did have some issues with cellulitis but that was taking care of recently she has not been wearing her compression socks I did have [name] brief conversation with her about the fact that she needs to make sure to wear these on [name] regular basis she voiced understanding. Unfortunately the patient around January which is when I was seeing her of this past year started to have [name] mass noted in her left breast. Subsequent that in December she had had some nipple drainage although she did not pay too much attention to it even with  [name] mass noted. By March she had bloody drainage coming out of this and there was the mass actually  identified by way of I believe ultrasound at that point. They went in from [name] surgical standpoint on June 10 and remove the mass the good news is this was not cancer the unfortunate news that she is having [name] difficult time getting this to heal following. She has not been told to pack this although she tells me she was put [name] little bit of gauze in herself. Fortunately I do not see any signs of active infection at this point which is good news. She did request [name] referral back to [place] to try to help get this closed as we have done well with her leg. Patient's most recent hemoglobin A1c was on 07-26-2022 and was 7.1 this is doing pretty well at this point in my opinion. Otherwise her past medical history really has not changed significantly. 08-29-2022 upon evaluation today patient appears to be doing pretty well currently in regard to the wound currently on the left breast location. She has been tolerating the dressing changes without complication and fortunately this actually seems to be much less deep compared to last week's evaluation. I am extremely pleased with where we stand today and I do not see any signs of active infection at this time. Objective Constitutional Well-nourished and well-hydrated in no acute distress. Vitals Time Taken: 1:04 PM, Height: 67 in, Weight: 180 lbs, BMI: 28.2, Temperature: 97.9 F, Pulse: 80 bpm, Respiratory Rate: 18 breaths/min, Blood Pressure: 145/75 mmHg. Respiratory normal breathing without difficulty. Psychiatric this patient is able to make decisions and demonstrates good insight into disease process. Alert and Oriented x 3. pleasant and cooperative. General Notes: Upon inspection patient's wound bed actually does have some necrotic debris that is loose enough I believe it actually clear away today and I discussed that with her at this point. She is in agreement with me trending this way. I think that the Dakin's moistened gauze packing is actually doing quite  well to help fill this in very well at this point. Integumentary (Hair, Skin) Wound #2 status is Open. Original cause of wound was Surgical Injury. The date acquired was: 06/19/2022. The wound has been in treatment 1 weeks. The wound is located on the Left Breast. The wound measures 1cm length x 2.5cm width x 2.5cm depth; 1.963cm^2 area and 4.909cm^3 volume. There is Fat Layer (Subcutaneous Tissue) exposed. There is [name] small amount of serosanguineous drainage noted. There is medium (34-66%) red granulation within the wound bed. There is [name] small (1-33%) amount of necrotic tissue within the wound bed. 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 There was [name] Selective/Open Wound Non-Viable Tissue Debridement with [name] total area of 0.2 sq cm performed by Allen Derry, PA-C. With the following instrument(s): Forceps, and Scissors to remove Non-Viable tissue/material. Material removed includes Slough after achieving pain control using Lidocaine 2% Topical Gel. No specimens were taken. [name] time out was conducted at 13:37, prior to the start of the procedure. There was no bleeding. The  procedure was tolerated well with [name] pain level of 0 throughout and [name] pain level of 0 following the procedure. Post Debridement Measurements: 1cm length x 2.5cm width x 2.5cm depth; 4.909cm^3 volume. Character of Wound/Ulcer Post Debridement is improved. Post procedure Diagnosis  Wound #2: Same as Pre-Procedure Plan Follow-up Appointments: Return Appointment in 1 week. Home Health: Home Health Company: - [facility] for wound care. May utilize formulary equivalent dressing for wound treatment orders unless otherwise specified. Home Health Nurse may visit PRN to address patients wound care needs. Scheduled days for dressing changes to be completed; exception, patient has scheduled wound care visit that day. **Please direct any NON-WOUND related issues/requests for orders to patient's Primary Care Physician. **If current dressing causes regression in wound condition, may D/C ordered dressing product/s and apply Normal Saline Moist Dressing daily until next [facility] or Other MD appointment. **Notify [facility] of regression in wound condition at [phone]. Bathing/ Shower/ Hygiene: May shower; gently cleanse wound with antibacterial soap, rinse and pat dry prior to dressing wounds No tub bath. Anesthetic (Use 'Patient Medications' Section for Anesthetic Order Entry): Lidocaine applied to wound bed WOUND #2: - Breast Wound Laterality: Left Cleanser: Byram Ancillary Kit - 15 Day Supply (Generic) 1 x Per Day/15 Days Discharge Instructions: Use supplies as instructed; Kit contains: (15) Saline Bullets; (15) 3x3 Gauze; 15 pr Gloves Cleanser: Soap and Water 1 x Per Day/15 Days Discharge Instructions: Gently cleanse wound with antibacterial soap, rinse and pat dry prior to dressing wounds Prim Dressing: Gauze 1 x Per Day/15 Days ary Discharge Instructions: lightly pack daily As directed:moistened with Dakins Solution Secondary Dressing: (BORDER) Zetuvit Plus SILICONE BORDER Dressing 5x5 (in/in) (Generic) 1 x Per Day/15 Days Discharge Instructions: Please do not put silicone bordered dressings under wraps. Use non-bordered dressing only. 1. I would recommend currently that we continue with the Dakin's moistened gauze packing which  actually seems to be doing quite well. The patient has been pleased and states that is actually helps the area to feel better when she has [name] PAC. 2. I am then recommend as well that she should continue to monitor for any signs of infection or worsening. After thinking changes she knows to contact the office let me know. We will see patient back for reevaluation in 1 week here in the clinic. If anything worsens or changes patient will contact our office for additional recommendations. Electronic Signature(s) Signed: 08/29/2022 6:15:58 PM By: Allen Derry PA-C Entered By: Allen Derry on 08/29/2022 18:15:58 Courtney [name] ([id number]) [id number] Page 8 of 8 -------------------------------------------------------------------------------- SuperBill Details Patient Name: Date of Service: Courtney [name], Courtney [name] 08/29/2022 Medical Record Number: [id number] Patient Account Number: [id number] Date of Birth/Sex: Treating RN: 1940-12-31 (82 y.o. 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 bilateral lower extremity I73.89 Other specified peripheral vascular diseases I10 Essential (primary) hypertension Facility Procedures : CPT4 Code: 54627035 Description: 00938 - WOUND CARE VISIT-LEV 2 EST PT Modifier: Quantity: 1 : CPT4 Code: [id number] Description: 97597 - DEBRIDE WOUND 1ST 20 SQ CM OR < ICD-10 Diagnosis Description L98.492 Non-pressure chronic ulcer of skin of other sites with  fat layer  exposed Modifier: Quantity: 1 Physician Procedures : CPT4 Code Description Modifier [id number] 97597 - WC PHYS DEBR WO ANESTH 20 SQ CM ICD-10 Diagnosis Description L98.492 Non-pressure chronic ulcer of skin of other sites with fat layer exposed Quantity: 1 Electronic Signature(s) Signed: 08/29/2022 6:16:20 PM By: Allen Derry PA-C Entered By: Allen Derry on 08/29/2022 18:16:19

## 2022-09-05 ENCOUNTER — Encounter: Payer: Medicare Other | Admitting: Internal Medicine

## 2022-09-05 DIAGNOSIS — E11622 Type 2 diabetes mellitus with other skin ulcer: Secondary | ICD-10-CM | POA: Diagnosis not present

## 2022-09-06 NOTE — Progress Notes (Signed)
Courtney [name], Courtney [name] ([id number]) [id number] Page 1 of 6 Visit Report for 09/05/2022 HPI Details Patient Name: Date of Service: ARTESIA, DUNMORE 09/05/2022 12:00 PM Medical Record Number: [id number] Patient Account Number: [id number] Date of Birth/Sex: Treating RN: 02-17-1940 (82 y.o. [name] Primary Care Provider: Bethann Punches Other Clinician: Referring Provider: Treating Provider/Extender: Chauncey Mann, MICHA EL Mila Homer in Treatment: 2 History of Present Illness HPI Description: 12-22-2021 upon evaluation today patient appears to be doing well currently in regard to her wound all things considered especially in light of the picture that they showed me from August when she initially had [name] skin tear. Fortunately there does not appear to be any signs of active infection locally nor systemically at this time which is great news and overall I am extremely pleased with the fact that she is making some progress here she has been placed on Cipro due to [name] culture result that was done by rheumatologist which showed that she had Pseudomonas and Staphylococcus aureus noted as the organisms in question. Fortunately I do not think that there is anything that seems to be still present or significant here I think she is actually doing much better which is great news. With that being said the patient tells me that she has been using Medihoney at this point which actually is really good does help to clear up [name] lot of this I do believe. In the beginning she was also placed on or rather in an Unna boot wrap based on what we are being told that was for [name] couple of weeks and then she did not have any additional follow-up with primary eventually rheumatology who were doing injections for her realize that she had [name] wound they stopped the injections which obviously would lower her immune response and healing chances. That is when she started on the antibiotics and  subsequently also was referred to vascular. The good news is from [name] vascular standpoint she seems to be probably sufficient to heal with [name] right ABI of 1.02 and [name] left ABI of 1.02 as well. Her TBI on the right was 0.63 and on the left 0.54. She does have [name] history of diabetes mellitus type 2 and she has an A1c of 6.5 on 10-17-2021. Other than this she also does have chronic venous insufficiency her leg is swollen today I definitely think [name] compression wraps probably can help her she also has hypertension 12-29-2021 upon evaluation patient's wound is actually showing signs of excellent improvement. Fortunately I do not see any signs of infection I think she is making good progress here and overall I do believe that she is really looking quite nice as far as the overall appearance of the wound bed is concerned. There is [name] little bit of necrotic tissue noted on the surface of the wound that is going require debridement today. 12/28; continued improvement in the area on the left lateral leg which was initially [name] skin tear in the setting of chronic venous insufficiency. She has been using Prisma with 3 layer compression. The length of the wound is down 0.5 cm 01-12-2022 upon evaluation today patient appears to be doing well currently in regard to her leg ulcer. She has been tolerating dressing changes without complication. Fortunately I do not see any signs of active infection locally nor systemically which is great news. No fevers, chills, nausea, vomiting, or diarrhea. 01-20-2022 upon evaluation today patient appears to be doing well currently in regard to her  wound. In fact this is measuring significantly smaller and I am very pleased with where we stand. I do not see any signs of active infection locally or systemically at this time. No fevers, chills, nausea, vomiting, or diarrhea. 02-03-2022 upon evaluation today patient appears to be doing well currently in regard to her wound. In fact she appears to be  completely healed based on what I am seeing. Fortunately I do not see any signs of active infection locally nor systemically which is great news. There is little piece of dried skin where the dressing had stuck once I remove this however there was nothing remaining underneath.' Readmission: 08-22-2022 this is an individual whom I am just seen actually at the beginning of the year although she healed this was in regard to her legs and her legs have continued to do quite well although she did have some issues with cellulitis but that was taking care of recently she has not been wearing her compression socks I did have [name] brief conversation with her about the fact that she needs to make sure to wear these on [name] regular basis she voiced understanding. Unfortunately the patient around January which is when I was seeing her of this past year started to have [name] mass noted in her left breast. Subsequent that in December she had had some nipple drainage although she did not pay too much attention to it even with [name] mass noted. By March she had bloody drainage coming out of this and there was the mass actually identified by way of I believe ultrasound at that point. They went in from [name] surgical standpoint on June 10 and remove the mass the good news is this was not cancer the unfortunate news that she is having [name] difficult time getting this to heal following. She has not been told to pack this although she tells me she was put [name] little bit of gauze in herself. Fortunately I do not see any signs of active infection at this point which is good news. She did request [name] referral back to [place] to try to help get this closed as we have done well with her leg. Patient's most recent hemoglobin A1c was on 07-26-2022 and was 7.1 this is doing pretty well at this point in my opinion. Otherwise her past medical history really has not changed significantly. 08-29-2022 upon evaluation today patient appears to be doing pretty well  currently in regard to the wound currently on the left breast location. She has been tolerating the dressing changes without complication and fortunately this actually seems to be much less deep compared to last week's evaluation. I am extremely pleased with where we stand today and I do not see any signs of active infection at this time. 8/27; patient who had multiple at least 2 biopsies and procedures for [name] mass in the left breast. 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 Nevertheless she has been left with [name] nonhealing wound area just superior to the nipple area. We have been using Dakin's wet to dry. The patient complains of mild stinging at the orifice of the wound from the wet Dakin's wicked gauze BLESSING, ASKAR [name] ([id number]) [id number] Page 2 of 6 Electronic Signature(s) Signed: 09/05/2022 5:06:52 PM By: Baltazar Najjar MD Entered By: Baltazar Najjar on 09/05/2022 13:23:20 -------------------------------------------------------------------------------- Physical Exam Details Patient Name: Date of Service: Courtney [name]. 09/05/2022 12:00 PM Medical Record Number: [id number] Patient Account Number: [id number] Date  of Birth/Sex: Treating RN: 07/22/1940 (82 y.o. [name] Primary Care Provider: Bethann Punches Other Clinician: Referring Provider: Treating Provider/Extender: RO BSO N, MICHA EL Betsy Pries, Mark Weeks in Treatment: 2 Constitutional Sitting or standing Blood Pressure is within target range for patient.. Pulse regular and within target range for patient.Marland Kitchen Respirations regular, non-labored and within target range.. Temperature is normal and within the target range for the patient.. Notes Wound exam left breast. Just above the nipple area is [name] cavity with some depth. Under illumination the base of the wound does not look particularly healthy. What I can see most of the rest of this looks like  decent tissue. There is no drainage no purulence although the patient states she sometimes sees clear blood- tinged fluid coming out of the wound area. It is firm especially inferiorly in the nipple area but no tenderness. Electronic Signature(s) Signed: 09/05/2022 5:06:52 PM By: Baltazar Najjar MD Entered By: Baltazar Najjar on 09/05/2022 13:24:58 -------------------------------------------------------------------------------- Physician Orders Details Patient Name: Date of Service: Courtney [name]. 09/05/2022 12:00 PM Medical Record Number: [id number] Patient Account Number: [id number] Date of Birth/Sex: Treating RN: [birth year] (82 y.o. [name] Primary Care Provider: Bethann Punches Other Clinician: Referring Provider: Treating Provider/Extender: RO BSO N, MICHA EL Mila Homer in Treatment: 2 Verbal / Phone Orders: No Diagnosis Coding Follow-up Appointments Return Appointment in 1 week. Home Health Home Health Company: - [facility] for wound care. May utilize formulary equivalent dressing for wound treatment orders unless otherwise specified. Home Health Nurse may visit PRN to address patients wound care needs. Scheduled days for dressing changes to be completed; exception, patient has scheduled wound care visit that day. **Please direct any NON-WOUND related issues/requests for orders to patient's Primary Care Physician. **If current dressing causes regression in wound condition, may D/C ordered dressing product/s and apply Normal Saline Moist Dressing daily until next Wound LEARLINE, FREEBERG [name] ([id number]) [id number] Page 3 of 6 Healing Center or Other MD appointment. **Notify Wound Healing Center of regression in wound condition at [phone]. Bathing/ Shower/ Hygiene May shower; gently cleanse wound with antibacterial soap, rinse and pat dry prior to dressing wounds No tub bath. Anesthetic (Use 'Patient Medications'  Section for Anesthetic Order Entry) Lidocaine applied to wound bed Wound Treatment Wound #2 - Breast Wound Laterality: Left Cleanser: Byram Ancillary Kit - 15 Day Supply (Generic) 1 x Per Day/15 Days Discharge Instructions: Use supplies as instructed; Kit contains: (15) Saline Bullets; (15) 3x3 Gauze; 15 pr Gloves Cleanser: Soap and Water 1 x Per Day/15 Days Discharge Instructions: Gently cleanse wound with antibacterial soap, rinse and pat dry prior to dressing wounds Prim Dressing: Gauze 1 x Per Day/15 Days ary Discharge Instructions: lightly pack daily As directed:moistened with Dakins Solution Secondary Dressing: (BORDER) Zetuvit Plus SILICONE BORDER Dressing 5x5 (in/in) (Generic) 1 x Per Day/15 Days Discharge Instructions: Please do not put silicone bordered dressings under wraps. Use non-bordered dressing only. Electronic Signature(s) Signed: 09/05/2022 5:06:52 PM By: Baltazar Najjar MD Signed: 09/05/2022 6:06:34 PM By: Angelina Pih Entered By: Angelina Pih on 09/05/2022 13:05:39 -------------------------------------------------------------------------------- Problem List Details Patient Name: Date of Service: Courtney [name]. 09/05/2022 12:00 PM Medical Record Number: [id number] Patient Account Number: [id number] Date of Birth/Sex: Treating RN: September 17, 1940 (82 y.o. 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 09/05/2022 12:00 PM Medical Record Number: [id number] Patient Account Number: [id number] Date of Birth/Sex: Treating RN: 06-17-1940 (82 y.o. [name] Primary Care Provider: Bethann Punches Other Clinician: Referring Provider: Treating Provider/Extender: Chauncey Mann, MICHA EL Betsy Pries, Mark Weeks in Treatment: 2 Subjective History of Present Illness (HPI) 12-22-2021 upon evaluation today patient appears to be doing well currently in regard to her wound all things considered especially in light of the picture that they showed me from August when she initially had [name] skin tear. Fortunately there does not appear to be any signs of active infection locally nor systemically at this time which is great news and overall I am extremely pleased with the fact that she is making some progress here she has been placed on Cipro due to [name] culture result that was done by rheumatologist which showed that she had Pseudomonas and Staphylococcus aureus noted as the organisms in question. Fortunately I do not think that there is anything that seems to be still present or significant here I think she is actually doing much better which is great news. With that being said the patient tells me that she has been using Medihoney at this point which actually is really good does help to clear up [name] lot of this I do believe. In the beginning she was also placed on or rather  in an Unna boot wrap based on what we are being told that was for [name] couple of weeks and then she did not have any additional follow-up with primary eventually rheumatology who were doing injections for her realize that she had [name] wound they stopped the injections which obviously would lower her immune response and healing chances. That is when she started on the antibiotics and subsequently also was referred to vascular. The good news is from [name] vascular standpoint she seems to be probably sufficient to heal with [name] right ABI of 1.02 and [name] left ABI of 1.02 as well. Her TBI on the right was 0.63 and on the left 0.54. She does have [name] history of diabetes mellitus type 2 and she has an A1c of 6.5 on 10-17-2021. Other than this she also does have chronic venous insufficiency her leg is swollen today I definitely think [name] compression wraps probably can help her she also has hypertension 12-29-2021 upon evaluation patient's wound is actually showing signs of excellent improvement. Fortunately I do not see any signs of infection I think she is making good progress here and overall I do believe that she is really looking quite nice as far as the overall appearance of the wound bed is concerned. There is [name] little bit of necrotic tissue noted on the surface of the wound that is going require debridement today. 12/28; continued improvement in the area on the left lateral leg which was initially [name]  skin tear in the setting of chronic venous insufficiency. She has been using Prisma with 3 layer compression. The length of the wound is down 0.5 cm 01-12-2022 upon evaluation today patient appears to be doing well currently in regard to her leg ulcer. She has been tolerating dressing changes without complication. Fortunately I do not see any signs of active infection locally nor systemically which is great news. No fevers, chills, nausea, vomiting, or diarrhea. 01-20-2022 upon evaluation today patient appears to be doing well  currently in regard to her wound. In fact this is measuring significantly smaller and I am very pleased with where we stand. I do not see any signs of active infection locally or systemically at this time. No fevers, chills, nausea, vomiting, or diarrhea. 02-03-2022 upon evaluation today patient appears to be doing well currently in regard to her wound. In fact she appears to be completely healed based on what I am seeing. Fortunately I do not see any signs of active infection locally nor systemically which is great news. There is little piece of dried skin where the dressing had stuck once I remove this however there was nothing remaining underneath.' Readmission: 08-22-2022 this is an individual whom I am just seen actually at the beginning of the year although she healed this was in regard to her legs and her legs have continued to do quite well although she did have some issues with cellulitis but that was taking care of recently she has not been wearing her compression socks I did have [name] brief conversation with her about the fact that she needs to make sure to wear these on [name] regular basis she voiced understanding. KONNER, SHORES [name] ([id number]) [id number] Page 5 of 6 Unfortunately the patient around January which is when I was seeing her of this past year started to have [name] mass noted in her left breast. Subsequent that in December she had had some nipple drainage although she did not pay too much attention to it even with [name] mass noted. By March she had bloody drainage coming out of this and there was the mass actually identified by way of I believe ultrasound at that point. They went in from [name] surgical standpoint on June 10 and remove the mass the good news is this was not cancer the unfortunate news that she is having [name] difficult time getting this to heal following. She has not been told to pack this although she tells me she was put [name] little bit of gauze in herself.  Fortunately I do not see any signs of active infection at this point which is good news. She did request [name] referral back to [place] to try to help get this closed as we have done well with her leg. Patient's most recent hemoglobin A1c was on 07-26-2022 and was 7.1 this is doing pretty well at this point in my opinion. Otherwise her past medical history really has not changed significantly. 08-29-2022 upon evaluation today patient appears to be doing pretty well currently in regard to the wound currently on the left breast location. She has been tolerating the dressing changes without complication and fortunately this actually seems to be much less deep compared to last week's evaluation. I am extremely pleased with where we stand today and I do not see any signs of active infection at this time. 8/27; patient who had multiple at least 2 biopsies and procedures for [name] mass in the left breast. This turned out to be benign on pathology  with ruptured duct Ectasia and stromal fibrosis. Nevertheless she has been left with [name] nonhealing wound area just superior to the nipple area. We have been using Dakin's wet to dry. The patient complains of mild stinging at the orifice of the wound from the wet Dakin's wicked gauze Objective Constitutional Sitting or standing Blood Pressure is within target range for patient.. Pulse regular and within target range for patient.Marland Kitchen Respirations regular, non-labored and within target range.. Temperature is normal and within the target range for the patient.. Vitals Time Taken: 12:17 PM, Height: 67 in, Weight: 180 lbs, BMI: 28.2, Temperature: 97.7 F, Pulse: 82 bpm, Respiratory Rate: 18 breaths/min, Blood Pressure: 141/79 mmHg. General Notes: Wound exam left breast. Just above the nipple area is [name] cavity with some depth. Under illumination the base of the wound does not look particularly healthy. What I can see most of the rest of this looks like decent tissue. There is no drainage no  purulence although the patient states she sometimes sees clear blood-tinged fluid coming out of the wound area. It is firm especially inferiorly in the nipple area but no tenderness. Integumentary (Hair, Skin) Wound #2 status is Open. Original cause of wound was Surgical Injury. The date acquired was: 06/19/2022. The wound has been in treatment 2 weeks. The wound is located on the Left Breast. The wound measures 0.6cm length x 1.4cm width x 2.6cm depth; 0.66cm^2 area and 1.715cm^3 volume. There is Fat Layer (Subcutaneous Tissue) exposed. There is no tunneling or undermining noted. There is [name] medium amount of serosanguineous drainage noted. There is medium (34-66%) red granulation within the wound bed. There is [name] medium (34-66%) amount of necrotic tissue within the wound bed including Adherent Slough. 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 Home Health: Home Health Company: - [facility] for wound care. May utilize formulary equivalent dressing for wound treatment orders unless otherwise specified. Home Health Nurse may visit PRN to address patients wound care needs. Scheduled days for dressing changes to be completed; exception, patient has scheduled wound care visit that day. **Please direct any NON-WOUND related issues/requests for orders to patient's Primary Care Physician. **If current dressing causes regression in wound condition, may D/C ordered dressing product/s and apply Normal Saline Moist Dressing daily until next Wound Healing Center or Other  MD appointment. **Notify Wound Healing Center of regression in wound condition at [phone]. Bathing/ Shower/ Hygiene: May shower; gently cleanse wound with antibacterial soap, rinse and pat dry prior to dressing wounds No tub bath. Anesthetic (Use 'Patient Medications' Section for Anesthetic Order Entry): Lidocaine applied to wound bed WOUND #2: - Breast Wound Laterality: Left Cleanser: Byram Ancillary Kit - 15 Day Supply (Generic) 1 x Per Day/15 Days Discharge Instructions: Use supplies as instructed; Kit contains: (15) Saline Bullets; (15) 3x3 Gauze; 15 pr Gloves Cleanser: Soap and Water 1 x Per Day/15 Days Courtney [name], Courtney [name] ([id number]) [id number] Page 6 of 6 Discharge Instructions: Gently cleanse wound with antibacterial soap, rinse and pat dry prior to dressing wounds Prim Dressing: Gauze 1 x Per Day/15 Days ary Discharge Instructions: lightly pack daily As directed:moistened with Dakins Solution Secondary Dressing: (BORDER) Zetuvit Plus SILICONE BORDER Dressing 5x5 (in/in) (  Generic) 1 x Per Day/15 Days Discharge Instructions: Please do not put silicone bordered dressings under wraps. Use non-bordered dressing only. 1. After some discussion we continued with the Dakin's wet to dry dressing perhaps we can put some gauze in the inferior part of this to prevent irritation of the nipple area. The goal of this is to clean out the base of the wound and then will need an alternative dressing when we accomplish that. 2. No evidence of infection Electronic Signature(s) Signed: 09/05/2022 5:06:52 PM By: Baltazar Najjar MD Entered By: Baltazar Najjar on 09/05/2022 13:26:25 -------------------------------------------------------------------------------- SuperBill Details Patient Name: Date of Service: Courtney [name] 09/05/2022 Medical Record Number: [id number] Patient Account Number: [id number] Date of Birth/Sex: Treating RN: [birth year] (82 y.o. 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 lower extremity I73.89 Other specified peripheral vascular diseases I10 Essential (primary) hypertension Facility Procedures : CPT4 Code: 40981191 Description: 99213 - WOUND CARE VISIT-LEV 3 EST PT Modifier: Quantity: 1 Physician Procedures : CPT4 Code Description Modifier [id number] 99213 - WC PHYS LEVEL 3 - EST PT ICD-10 Diagnosis Description T81.31XA Disruption of external operation (surgical) wound, not elsewhere classified, initial encounter L98.492 Non-pressure chronic ulcer of skin of  other sites with fat layer exposed Quantity: 1 Electronic Signature(s) Signed: 09/05/2022 5:06:52 PM By: Baltazar Najjar MD Entered By: Baltazar Najjar on 09/05/2022 13:26:51

## 2022-09-06 NOTE — Progress Notes (Signed)
JOBYNA, WEIMAR [name] ([id number]) [id number] Page 1 of 8 Visit Report for 08/29/2022 Arrival Information Details Patient Name: Date of Service: Courtney [name], Courtney [name] 08/29/2022 1:00 PM Medical Record Number: [id number] Patient Account Number: [id number] Date of Birth/Sex: Treating RN: [birth year] (82 y.o. Courtney [name], Courtney [name] Primary Care Courtney [name]: Courtney [name] Other Clinician: Referring Courtney [name]: Treating Courtney [name]/Extender: Courtney [name] in Treatment: 1 Visit Information History Since Last Visit All ordered tests and consults were completed: No Patient Arrived: Courtney [name] Added or deleted any medications: No Arrival Time: 13:02 Any new allergies or adverse reactions: No Accompanied By: husband Had [name] fall or experienced change in No Transfer Assistance: Manual activities of daily living that may affect Patient Identification Verified: Yes risk of falls: Secondary Verification Process Completed: Yes Signs or symptoms of abuse/neglect since last visito No Patient Requires Transmission-Based No Hospitalized since last visit: No Precautions: Implantable device outside of the clinic excluding No Patient Has Alerts: Yes cellular tissue based products placed in the center Patient Alerts: Patient on Blood Thinner since last visit: Left breast mass Has Dressing in Place as Prescribed: Yes removed Pain Present Now: No Electronic Signature(s) Signed: 09/06/2022 5:16:58 PM By: Bonnell Public Entered By: Bonnell Public on 08/29/2022 13:03:33 -------------------------------------------------------------------------------- Clinic Level of Care Assessment Details Patient Name: Date of Service: Courtney [name] 08/29/2022 1:00 PM Medical Record Number: [id number] Patient Account Number: [id number] Date of Birth/Sex: Treating RN: 21-Mar-1940 (82 y.o. Courtney [name], Courtney [name] Primary Care Eryka Dolinger: Courtney [name] Other Clinician: Referring Courtney [name]: Treating Courtney [name]/Extender:  Courtney [name] in Treatment: 1 Clinic Level of Care Assessment Items TOOL 4 Quantity Score []  - 0 Use when only an EandM is performed on FOLLOW-UP visit ASSESSMENTS - Nursing Assessment / Reassessment X- 1 10 Reassessment of Co-morbidities (includes updates in patient status) X- 1 5 Reassessment of Adherence to Treatment Plan Courtney [name], Courtney [name] ([id number]) [id number] Page 2 of 8 ASSESSMENTS - Wound and Skin [name] ssessment / Reassessment X - Simple Wound Assessment / Reassessment - one wound 1 5 []  - 0 Complex Wound Assessment / Reassessment - multiple wounds []  - 0 Dermatologic / Skin Assessment (not related to wound area) ASSESSMENTS - Focused Assessment []  - 0 Circumferential Edema Measurements - multi extremities []  - 0 Nutritional Assessment / Counseling / Intervention []  - 0 Lower Extremity Assessment (monofilament, tuning fork, pulses) []  - 0 Peripheral Arterial Disease Assessment (using hand held doppler) ASSESSMENTS - Ostomy and/or Continence Assessment and Care []  - 0 Incontinence Assessment and Management []  - 0 Ostomy Care Assessment and Management (repouching, etc.) PROCESS - Coordination of Care []  - 0 Simple Patient / Family Education for ongoing care []  - 0 Complex (extensive) Patient / Family Education for ongoing care X- 1 10 Staff obtains Chiropractor, Records, T Results / Process Orders est []  - 0 Staff telephones HHA, Nursing Homes / Clarify orders / etc []  - 0 Routine Transfer to another Facility (non-emergent condition) []  - 0 Routine Hospital Admission (non-emergent condition) []  - 0 New Admissions / Manufacturing engineer / Ordering NPWT Apligraf, etc. , []  - 0 Emergency Hospital Admission (emergent condition) X- 1 10 Simple Discharge Coordination []  - 0 Complex (extensive) Discharge Coordination PROCESS - Special Needs []  - 0 Pediatric / Minor Patient Management []  - 0 Isolation Patient  Management []  - 0 Hearing / Language / Visual special needs []  - 0 Assessment of Community assistance (transportation, D/C planning, etc.) []  - 0 Additional assistance / Altered mentation []  - 0 Support Surface(s) Assessment (  bed, cushion, seat, etc.) INTERVENTIONS - Wound Cleansing / Measurement X - Simple Wound Cleansing - one wound 1 5 []  - 0 Complex Wound Cleansing - multiple wounds X- 1 5 Wound Imaging (photographs - any number of wounds) []  - 0 Wound Tracing (instead of photographs) X- 1 5 Simple Wound Measurement - one wound []  - 0 Complex Wound Measurement - multiple wounds INTERVENTIONS - Wound Dressings []  - 0 Small Wound Dressing one or multiple wounds []  - 0 Medium Wound Dressing one or multiple wounds []  - 0 Large Wound Dressing one or multiple wounds []  - 0 Application of Medications - topical []  - 0 Application of Medications - injection INTERVENTIONS - Miscellaneous []  - 0 External ear exam Courtney [name], Courtney [name] ([id number]) [id number] Page 3 of 8 []  - 0 Specimen Collection (cultures, biopsies, blood, body fluids, etc.) []  - 0 Specimen(s) / Culture(s) sent or taken to Lab for analysis []  - 0 Patient Transfer (multiple staff / Michiel Sites Lift / Similar devices) []  - 0 Simple Staple / Suture removal (25 or less) []  - 0 Complex Staple / Suture removal (26 or more) []  - 0 Hypo / Hyperglycemic Management (close monitor of Blood Glucose) []  - 0 Ankle / Brachial Index (ABI) - do not check if billed separately X- 1 5 Vital Signs Has the patient been seen at the hospital within the last three years: Yes Total Score: 60 Level Of Care: New/Established - Level 2 Electronic Signature(s) Signed: 09/06/2022 5:16:58 PM By: Bonnell Public Entered By: Bonnell Public on 08/29/2022 13:47:47 -------------------------------------------------------------------------------- Encounter Discharge Information Details Patient Name: Date of Service: Courtney Curd [name]. 08/29/2022 1:00 PM Medical Record Number: [id number] Patient Account Number: [id number] Date of Birth/Sex: Treating RN: 19-Dec-1940 (82 y.o. Courtney [name], Courtney [name] Primary Care Terryn Rosenkranz: Courtney [name] Other Clinician: Referring Belal Scallon: Treating Jeorgia Helming/Extender: Courtney [name] in Treatment: 1 Encounter Discharge Information Items Post Procedure Vitals Discharge Condition: Stable Temperature (F): 97.9 Ambulatory Status: Cane Pulse (bpm): 80 Discharge Destination: Home Respiratory Rate (breaths/min): 18 Transportation: Private Auto Blood Pressure (mmHg): 145/75 Accompanied By: husband Schedule Follow-up Appointment: Yes Clinical Summary of Care: Electronic Signature(s) Signed: 09/06/2022 5:16:58 PM By: Bonnell Public Entered By: Bonnell Public on 08/29/2022 13:49:56 Lower Extremity Assessment Details -------------------------------------------------------------------------------- Mendel Corning ([id number]) [id number] Page 4 of 8 Patient Name: Date of Service: Courtney [name], Courtney [name] 08/29/2022 1:00 PM Medical Record Number: [id number] Patient Account Number: [id number] Date of Birth/Sex: Treating RN: 02/17/1940 (82 y.o. Courtney [name], Courtney [name] Primary Care Caileigh Canche: Courtney [name] Other Clinician: Referring Ninnie Fein: Treating Denzel Etienne/Extender: Courtney [name] in Treatment: 1 Electronic Signature(s) Signed: 09/06/2022 5:16:58 PM By: Bonnell Public Entered By: Bonnell Public on 08/29/2022 13:06:26 -------------------------------------------------------------------------------- Multi Wound Chart Details Patient Name: Date of Service: Courtney Curd [name]. 08/29/2022 1:00 PM Medical Record Number: [id number] Patient Account Number: [id number] Date of Birth/Sex: Treating RN: 06/18/1940 (82 y.o. Courtney [name], Courtney [name] Primary Care Maika Mcelveen: Courtney [name] Other Clinician: Referring Monike Bragdon: Treating Loyce Flaming/Extender: Courtney [name] in Treatment: 1 Vital Signs Height(in): 67 Pulse(bpm): 80 Weight(lbs): 180 Blood Pressure(mmHg): 145/75 Body Mass Index(BMI): 28.2 Temperature(F): 97.9 Respiratory Rate(breaths/min): 18 [2:Photos:] [N/[name]:N/[name]] Left Breast N/[name] N/[name] Wound Location: Surgical Injury N/[name] N/[name] Wounding Event: Open Surgical Wound N/[name] N/[name] Primary Etiology: Sleep Apnea, Hypertension, Peripheral N/[name] N/[name] Comorbid History: Arterial Disease, Type II Diabetes, Rheumatoid Arthritis, Osteoarthritis, Neuropathy 06/19/2022 N/[name] N/[name] Date Acquired: 1 N/[name] N/[name] Weeks of Treatment: Open N/[name] N/[name] Wound Status: No N/[name] N/[name] Wound Recurrence: 1x205x2.5 N/[name] N/[name] Measurements  L x W x D (cm) 161.007 N/[name] N/[name] [name] (cm) : rea 402.517 N/[name] N/[name] Volume (cm) : -8102.10% N/[name] N/[name] % Reduction in Area: -5025.00% N/[name] N/[name] % Reduction in Volume: Full Thickness With Exposed Support N/[name] N/[name] Classification: Structures Small N/[name] N/[name] Exudate Amount: Serosanguineous N/[name] N/[name] Exudate Type: red, brown N/[name] N/[name] Exudate Color: Medium (34-66%) N/[name] N/[name] Granulation Amount: Red N/[name] N/[name] Granulation Quality: Small (1-33%) N/[name] N/[name] Necrotic Amount: Fat Layer (Subcutaneous Tissue): Yes N/[name] N/[name] Exposed Structures: EVANA, HOLEY [name] ([id number]) [id number] Page 5 of 8 None N/[name] N/[name] Epithelialization: Treatment Notes Electronic Signature(s) Signed: 09/06/2022 5:16:58 PM By: Bonnell Public Entered By: Bonnell Public on 08/29/2022 13:36:05 -------------------------------------------------------------------------------- Multi-Disciplinary Care Plan Details Patient Name: Date of Service: Courtney Curd [name]. 08/29/2022 1:00 PM Medical Record Number: [id number] Patient Account Number: [id number] Date of Birth/Sex: Treating RN: [birth year] (82 y.o. Courtney [name], Courtney [name] Primary Care Rosea Dory: Courtney [name] Other Clinician: Referring Lavonte Palos: Treating Bao Bazen/Extender: Courtney [name] in Treatment: 1 Active  Inactive Wound/Skin Impairment Nursing Diagnoses: Impaired tissue integrity Knowledge deficit related to ulceration/compromised skin integrity Goals: Ulcer/skin breakdown will have [name] volume reduction of 30% by week 4 Date Initiated: 08/22/2022 Target Resolution Date: 09/19/2022 Goal Status: Active Ulcer/skin breakdown will have [name] volume reduction of 50% by week 8 Date Initiated: 08/22/2022 Target Resolution Date: 10/17/2022 Goal Status: Active Ulcer/skin breakdown will have [name] volume reduction of 80% by week 12 Date Initiated: 08/22/2022 Target Resolution Date: 11/14/2022 Goal Status: Active Ulcer/skin breakdown will heal within 14 weeks Date Initiated: 08/22/2022 Target Resolution Date: 11/28/2022 Goal Status: Active Interventions: Assess patient/caregiver ability to obtain necessary supplies Assess patient/caregiver ability to perform ulcer/skin care regimen upon admission and as needed Assess ulceration(s) every visit Provide education on ulcer and skin care Treatment Activities: Skin care regimen initiated : 08/22/2022 Notes: Electronic Signature(s) Signed: 09/06/2022 5:16:58 PM By: Bonnell Public Entered By: Bonnell Public on 08/29/2022 13:49:00 Courtney [name] ([id number]) [id number] Page 6 of 8 -------------------------------------------------------------------------------- Pain Assessment Details Patient Name: Date of Service: Courtney [name], Courtney [name] 08/29/2022 1:00 PM Medical Record Number: [id number] Patient Account Number: [id number] Date of Birth/Sex: Treating RN: [birth year] (82 y.o. Courtney [name], Courtney [name] Primary Care Tarren Sabree: Courtney [name] Other Clinician: Referring Dahlton Hinde: Treating Charnise Lovan/Extender: Courtney [name] in Treatment: 1 Active Problems Location of Pain Severity and Description of Pain Patient Has Paino No Site Locations Pain Management and Medication Current Pain Management: Electronic Signature(s) Signed: 09/06/2022  5:16:58 PM By: Bonnell Public Entered By: Bonnell Public on 08/29/2022 13:05:58 -------------------------------------------------------------------------------- Patient/Caregiver Education Details Patient Name: Date of Service: Courtney [name] 8/20/2024andnbsp1:00 PM Medical Record Number: [id number] Patient Account Number: [id number] Date of Birth/Gender: Treating RN: 12-Jul-1940 (82 y.o. Courtney [name], Courtney [name] Primary Care Physician: Courtney [name] Other Clinician: Referring Physician: Treating Physician/Extender: Courtney [name] in Treatment: 1 Courtney [name], Courtney [name] ([id number]) [id number] Page 7 of 8 Education Assessment Education Provided To: Patient Education Topics Provided Wound/Skin Impairment: Handouts: Caring for Your Ulcer Methods: Explain/Verbal Responses: State content correctly Electronic Signature(s) Signed: 09/06/2022 5:16:58 PM By: Bonnell Public Entered By: Bonnell Public on 08/29/2022 13:49:15 -------------------------------------------------------------------------------- Wound Assessment Details Patient Name: Date of Service: Courtney [name], Courtney [name] 08/29/2022 1:00 PM Medical Record Number: [id number] Patient Account Number: [id number] Date of Birth/Sex: Treating RN: Feb 01, 1940 (82 y.o. Courtney [name], Courtney [name] Primary Care Montie Swiderski: Courtney [name] Other Clinician: Referring Waldemar Siegel: Treating Eliani Leclere/Extender: Courtney [name] in Treatment: 1 Wound Status Wound Number: 2 Primary Open Surgical Wound Etiology: Wound Location: Left Breast Wound  Open Wounding Event: Surgical Injury Status: Date Acquired: 06/19/2022 Comorbid Sleep Apnea, Hypertension, Peripheral Arterial Disease, Type II Weeks Of Treatment: 1 History: Diabetes, Rheumatoid Arthritis, Osteoarthritis, Neuropathy Clustered Wound: No Wound Measurements Length: (cm) 1 Width: (cm) 2.5 Depth: (cm) 2.5 Area: (cm) 1.963 Volume: (cm) 4.909 % Reduction in Area: 0% %  Reduction in Volume: 37.5% Epithelialization: None Wound Description Classification: Full Thickness With Exposed Support Structures Exudate Amount: Small Exudate Type: Serosanguineous Exudate Color: red, brown Foul Odor After Cleansing: No Slough/Fibrino Yes Wound Bed Granulation Amount: Medium (34-66%) Exposed Structure Granulation Quality: Red Fat Layer (Subcutaneous Tissue) Exposed: Yes Necrotic Amount: Small (1-33%) Electronic Signature(s) Signed: 09/06/2022 5:16:58 PM By: Bonnell Public Entered By: Bonnell Public on 08/29/2022 13:39:36 Courtney [name], Courtney [name] ([id number]) [id number] Page 8 of 8 -------------------------------------------------------------------------------- Vitals Details Patient Name: Date of Service: Courtney [name], Courtney [name] 08/29/2022 1:00 PM Medical Record Number: [id number] Patient Account Number: [id number] Date of Birth/Sex: Treating RN: [birth year] (82 y.o. Courtney [name], Courtney [name] Primary Care Ladarrian Asencio: Courtney [name] Other Clinician: Referring Shearon Clonch: Treating Ceniyah Thorp/Extender: Courtney [name] in Treatment: 1 Vital Signs Time Taken: 13:04 Temperature (F): 97.9 Height (in): 67 Pulse (bpm): 80 Weight (lbs): 180 Respiratory Rate (breaths/min): 18 Body Mass Index (BMI): 28.2 Blood Pressure (mmHg): 145/75 Reference Range: 80 - 120 mg / dl Electronic Signature(s) Signed: 09/06/2022 5:16:58 PM By: Bonnell Public Entered By: Bonnell Public on 08/29/2022 13:05:36

## 2022-09-06 NOTE — Progress Notes (Signed)
[name], LATTIN [name] ([id number]) [id number] Page 1 of 9 Visit Report for 09/05/2022 Arrival Information Details Patient Name: Date of Service: Courtney [name] 09/05/2022 12:00 PM Medical Record Number: [id number] Patient Account Number: [id number] Date of Birth/Sex: Treating RN: 1940-08-18 (82 y.o. [name] Primary Care Khrystyne Arpin: Bethann Punches Other Clinician: Referring Kanai Hilger: Treating Manson Luckadoo/Extender: RO BSO N, MICHA EL Betsy Pries, Mark Weeks in Treatment: 2 Visit Information History Since Last Visit Added or deleted any medications: No Patient Arrived: [place] Mor Any new allergies or adverse reactions: No Arrival Time: 12:15 Had [name] fall or experienced change in No Accompanied By: spouse activities of daily living that may affect Transfer Assistance: None risk of falls: Patient Identification Verified: Yes Hospitalized since last visit: No Secondary Verification Process Completed: Yes Has Dressing in Place as Prescribed: Yes Patient Requires Transmission-Based No Pain Present Now: No Precautions: Patient Has Alerts: Yes Patient Alerts: Patient on Blood Thinner Left breast mass removed Electronic Signature(s) Signed: 09/05/2022 6:06:34 PM By: Angelina Pih Entered By: Angelina Pih on 09/05/2022 12:17:15 -------------------------------------------------------------------------------- Clinic Level of Care Assessment Details Patient Name: Date of Service: Courtney [name] 09/05/2022 12:00 PM Medical Record Number: [id number] Patient Account Number: [id number] Date of Birth/Sex: Treating RN: [birth year] (82 y.o. [name] Primary Care Brean Carberry: Bethann Punches Other Clinician: Referring Jhair Witherington: Treating Mickle Campton/Extender: RO BSO N, MICHA EL Betsy Pries, Mark Weeks in Treatment: 2 Clinic Level of Care Assessment Items TOOL 4 Quantity Score []  - 0 Use when only an EandM is performed on FOLLOW-UP visit ASSESSMENTS - Nursing Assessment /  Reassessment X- 1 10 Reassessment of Co-morbidities (includes updates in patient status) X- 1 5 Reassessment of Adherence to Treatment Plan ASSESSMENTS - Wound and Skin Assessment / Reassessment KOOPER, [name] [name] ([id number]) [id number] Page 2 of 9 X- 1 5 Simple Wound Assessment / Reassessment - one wound []  - 0 Complex Wound Assessment / Reassessment - multiple wounds []  - 0 Dermatologic / Skin Assessment (not related to wound area) ASSESSMENTS - Focused Assessment []  - 0 Circumferential Edema Measurements - multi extremities []  - 0 Nutritional Assessment / Counseling / Intervention []  - 0 Lower Extremity Assessment (monofilament, tuning fork, pulses) []  - 0 Peripheral Arterial Disease Assessment (using hand held doppler) ASSESSMENTS - Ostomy and/or Continence Assessment and Care []  - 0 Incontinence Assessment and Management []  - 0 Ostomy Care Assessment and Management (repouching, etc.) PROCESS - Coordination of Care X - Simple Patient / Family Education for ongoing care 1 15 []  - 0 Complex (extensive) Patient / Family Education for ongoing care X- 1 10 Staff obtains Chiropractor, Records, T Results / Process Orders est []  - 0 Staff telephones HHA, Nursing Homes / Clarify orders / etc []  - 0 Routine Transfer to another Facility (non-emergent condition) []  - 0 Routine Hospital Admission (non-emergent condition) []  - 0 New Admissions / Manufacturing engineer / Ordering NPWT Apligraf, etc. , []  - 0 Emergency Hospital Admission (emergent condition) X- 1 10 Simple Discharge Coordination []  - 0 Complex (extensive) Discharge Coordination PROCESS - Special Needs []  - 0 Pediatric / Minor Patient Management []  - 0 Isolation Patient Management []  - 0 Hearing / Language / Visual special needs []  - 0 Assessment of Community assistance (transportation, D/C planning, etc.) []  - 0 Additional assistance / Altered mentation []  - 0 Support Surface(s)  Assessment (bed, cushion, seat, etc.) INTERVENTIONS - Wound Cleansing / Measurement X - Simple Wound Cleansing - one wound 1 5 []  - 0 Complex Wound Cleansing - multiple wounds  X- 1 5 Wound Imaging (photographs - any number of wounds) []  - 0 Wound Tracing (instead of photographs) X- 1 5 Simple Wound Measurement - one wound []  - 0 Complex Wound Measurement - multiple wounds INTERVENTIONS - Wound Dressings []  - 0 Small Wound Dressing one or multiple wounds X- 1 15 Medium Wound Dressing one or multiple wounds []  - 0 Large Wound Dressing one or multiple wounds []  - 0 Application of Medications - topical []  - 0 Application of Medications - injection INTERVENTIONS - Miscellaneous []  - 0 External ear exam []  - 0 Specimen Collection (cultures, biopsies, blood, body fluids, etc.) Courtney [name] ([id number]) [id number] Page 3 of 9 []  - 0 Specimen(s) / Culture(s) sent or taken to Lab for analysis []  - 0 Patient Transfer (multiple staff / Nurse, adult / Similar devices) []  - 0 Simple Staple / Suture removal (25 or less) []  - 0 Complex Staple / Suture removal (26 or more) []  - 0 Hypo / Hyperglycemic Management (close monitor of Blood Glucose) []  - 0 Ankle / Brachial Index (ABI) - do not check if billed separately X- 1 5 Vital Signs Has the patient been seen at the hospital within the last three years: Yes Total Score: 90 Level Of Care: New/Established - Level 3 Electronic Signature(s) Signed: 09/05/2022 6:06:34 PM By: Angelina Pih Entered By: Angelina Pih on 09/05/2022 13:06:21 -------------------------------------------------------------------------------- Encounter Discharge Information Details Patient Name: Date of Service: Courtney Curd [name]. 09/05/2022 12:00 PM Medical Record Number: [id number] Patient Account Number: [id number] Date of Birth/Sex: Treating RN: 1940-11-15 (82 y.o. [name] Primary Care Shimeka Bacot: Bethann Punches Other  Clinician: Referring Leanza Shepperson: Treating Ansen Sayegh/Extender: RO BSO N, MICHA EL Mila Homer in Treatment: 2 Encounter Discharge Information Items Discharge Condition: Stable Ambulatory Status: Cane Discharge Destination: Home Transportation: Private Auto Accompanied By: spouse Schedule Follow-up Appointment: Yes Clinical Summary of Care: Electronic Signature(s) Signed: 09/05/2022 6:06:34 PM By: Angelina Pih Entered By: Angelina Pih on 09/05/2022 13:07:34 -------------------------------------------------------------------------------- Lower Extremity Assessment Details Patient Name: Date of Service: JHERI, ROURKE 09/05/2022 12:00 PM Medical Record Number: [id number] Patient Account Number: [id number] EVERLENE, ITURRALDE [name] ([id number]) [id number] Page 4 of 9 Date of Birth/Sex: Treating RN: 1940-09-08 (82 y.o. [name] Primary Care Orlando Thalmann: Other Clinician: Bethann Punches Referring Sherine Cortese: Treating Simon Llamas/Extender: RO BSO Dorris Carnes, MICHA EL Mila Homer in Treatment: 2 Electronic Signature(s) Signed: 09/05/2022 6:06:34 PM By: Angelina Pih Entered By: Angelina Pih on 09/05/2022 12:27:50 -------------------------------------------------------------------------------- Multi Wound Chart Details Patient Name: Date of Service: Courtney Curd [name]. 09/05/2022 12:00 PM Medical Record Number: [id number] Patient Account Number: [id number] Date of Birth/Sex: Treating RN: 02/17/1940 (82 y.o. [name] Primary Care Jorgeluis Gurganus: Bethann Punches Other Clinician: Referring Massie Cogliano: Treating Rindy Kollman/Extender: RO BSO N, MICHA EL Betsy Pries, Mark Weeks in Treatment: 2 Vital Signs Height(in): 67 Pulse(bpm): 82 Weight(lbs): 180 Blood Pressure(mmHg): 141/79 Body Mass Index(BMI): 28.2 Temperature(F): 97.7 Respiratory Rate(breaths/min): 18 [2:Photos:] [N/[name]:N/[name]] Left Breast N/[name] N/[name] Wound Location: Surgical Injury N/[name] N/[name] Wounding  Event: Open Surgical Wound N/[name] N/[name] Primary Etiology: Sleep Apnea, Hypertension, Peripheral N/[name] N/[name] Comorbid History: Arterial Disease, Type II Diabetes, Rheumatoid Arthritis, Osteoarthritis, Neuropathy 06/19/2022 N/[name] N/[name] Date Acquired: 2 N/[name] N/[name] Weeks of Treatment: Open N/[name] N/[name] Wound Status: No N/[name] N/[name] Wound Recurrence: 0.6x1.4x2.6 N/[name] N/[name] Measurements L x W x D (cm) 0.66 N/[name] N/[name] [name] (cm) : rea 1.715 N/[name] N/[name] Volume (cm) : 66.40% N/[name] N/[name] % Reduction in Area: 78.20% N/[name] N/[name] % Reduction in Volume: Full  Thickness With Exposed Support N/[name] N/[name] Classification: Structures Medium N/[name] N/[name] Exudate [name] mount: Serosanguineous N/[name] N/[name] Exudate Type: red, brown N/[name] N/[name] Exudate Color: Medium (34-66%) N/[name] N/[name] Granulation [name] mount: Red N/[name] N/[name] Granulation Quality: Medium (34-66%) N/[name] N/[name] Necrotic [name] mount: Fat Layer (Subcutaneous Tissue): Yes N/[name] N/[name] Exposed Structures: None N/[name] N/[name] EpithelializationPURVI, RIDGELL [name] ([id number]) [id number] Page 5 of 9 Treatment Notes Electronic Signature(s) Signed: 09/05/2022 6:06:34 PM By: Angelina Pih Entered By: Angelina Pih on 09/05/2022 12:54:08 -------------------------------------------------------------------------------- Multi-Disciplinary Care Plan Details Patient Name: Date of Service: Courtney Curd [name]. 09/05/2022 12:00 PM Medical Record Number: [id number] Patient Account Number: [id number] Date of Birth/Sex: Treating RN: [birth year] (82 y.o. [name] Primary Care Yarimar Lavis: Bethann Punches Other Clinician: Referring Doyne Ellinger: Treating Allyah Heather/Extender: RO BSO N, MICHA EL Betsy Pries, Mark Weeks in Treatment: 2 Active Inactive Wound/Skin Impairment Nursing Diagnoses: Impaired tissue integrity Knowledge deficit related to ulceration/compromised skin integrity Goals: Ulcer/skin breakdown will have [name] volume reduction of 30% by week 4 Date Initiated: 08/22/2022 Target Resolution Date:  09/19/2022 Goal Status: Active Ulcer/skin breakdown will have [name] volume reduction of 50% by week 8 Date Initiated: 08/22/2022 Target Resolution Date: 10/17/2022 Goal Status: Active Ulcer/skin breakdown will have [name] volume reduction of 80% by week 12 Date Initiated: 08/22/2022 Target Resolution Date: 11/14/2022 Goal Status: Active Ulcer/skin breakdown will heal within 14 weeks Date Initiated: 08/22/2022 Target Resolution Date: 11/28/2022 Goal Status: Active Interventions: Assess patient/caregiver ability to obtain necessary supplies Assess patient/caregiver ability to perform ulcer/skin care regimen upon admission and as needed Assess ulceration(s) every visit Provide education on ulcer and skin care Treatment Activities: Skin care regimen initiated : 08/22/2022 Notes: Electronic Signature(s) Signed: 09/05/2022 6:06:34 PM By: Angelina Pih Entered By: Angelina Pih on 09/05/2022 13:06:43 [name] ([id number]) [id number] Page 6 of 9 -------------------------------------------------------------------------------- Pain Assessment Details Patient Name: Date of Service: TYONA, SANTIAGO 09/05/2022 12:00 PM Medical Record Number: [id number] Patient Account Number: [id number] Date of Birth/Sex: Treating RN: [birth year] (82 y.o. [name] Primary Care Ilina Xu: Bethann Punches Other Clinician: Referring Danford Tat: Treating Julie Paolini/Extender: RO BSO N, MICHA EL Betsy Pries, Mark Weeks in Treatment: 2 Active Problems Location of Pain Severity and Description of Pain Patient Has Paino No Site Locations Rate the pain. Current Pain Level: 0 Pain Management and Medication Current Pain Management: Electronic Signature(s) Signed: 09/05/2022 6:06:34 PM By: Angelina Pih Entered By: Angelina Pih on 09/05/2022 12:17:35 -------------------------------------------------------------------------------- Patient/Caregiver Education Details Patient Name: Date  of Service: Annamaria Boots 8/27/2024andnbsp12:00 PM Medical Record Number: [id number] Patient Account Number: [id number] Date of Birth/Gender: Treating RN: 13-Jul-1940 (82 y.o. [name] Primary Care Physician: Bethann Punches Other Clinician: Referring Physician: Treating Physician/Extender: RO BSO N, MICHA EL Mila Homer in Treatment: 2 TYEESHA, HERIN [name] ([id number]) [id number] Page 7 of 9 Education Assessment Education Provided To: Patient Education Topics Provided Wound/Skin Impairment: Handouts: Caring for Your Ulcer Methods: Explain/Verbal Responses: State content correctly Electronic Signature(s) Signed: 09/05/2022 6:06:34 PM By: Angelina Pih Entered By: Angelina Pih on 09/05/2022 13:07:03 -------------------------------------------------------------------------------- Wound Assessment Details Patient Name: Date of Service: AMOUR, TULLER 09/05/2022 12:00 PM Medical Record Number: [id number] Patient Account Number: [id number] Date of Birth/Sex: Treating RN: [birth year] (82 y.o. [name] Primary Care Izel Hochberg: Bethann Punches Other Clinician: Referring Sahar Ryback: Treating Satoshi Kalas/Extender: RO BSO N, MICHA EL Betsy Pries, Mark Weeks in Treatment: 2 Wound Status Wound Number: 2 Primary Open Surgical Wound Etiology: Wound Location: Left Breast Wound Open Wounding Event: Surgical Injury Status: Date Acquired:  06/19/2022 Comorbid Sleep Apnea, Hypertension, Peripheral Arterial Disease, Type II Weeks Of Treatment: 2 History: Diabetes, Rheumatoid Arthritis, Osteoarthritis, Neuropathy Clustered Wound: No Photos Wound Measurements Length: (cm) 0.6 Width: (cm) 1.4 Depth: (cm) 2.6 Area: (cm) 0.66 Volume: (cm) 1.715 % Reduction in Area: 66.4% % Reduction in Volume: 78.2% Epithelialization: None Tunneling: No Undermining: No Wound Description Classification: Full Thickness With Exposed Support Structures Exudate  Amount: Medium Exudate Type: Serosanguineous Jacuinde, Geralene [name] ([id number]) Exudate Color: red, brown Foul Odor After Cleansing: No Slough/Fibrino Yes [id number] Page 8 of 9 Wound Bed Granulation Amount: Medium (34-66%) Exposed Structure Granulation Quality: Red Fat Layer (Subcutaneous Tissue) Exposed: Yes Necrotic Amount: Medium (34-66%) Necrotic Quality: Adherent Slough Treatment Notes Wound #2 (Breast) Wound Laterality: Left Cleanser Byram Ancillary Kit - 15 Day Supply Discharge Instruction: Use supplies as instructed; Kit contains: (15) Saline Bullets; (15) 3x3 Gauze; 15 pr Gloves Soap and Water Discharge Instruction: Gently cleanse wound with antibacterial soap, rinse and pat dry prior to dressing wounds Peri-Wound Care Topical Primary Dressing Gauze Discharge Instruction: lightly pack daily As directed:moistened with Dakins Solution Secondary Dressing (BORDER) Zetuvit Plus SILICONE BORDER Dressing 5x5 (in/in) Discharge Instruction: Please do not put silicone bordered dressings under wraps. Use non-bordered dressing only. Secured With Compression Wrap Compression Stockings Facilities manager) Signed: 09/05/2022 6:06:34 PM By: Angelina Pih Entered By: Angelina Pih on 09/05/2022 12:27:37 -------------------------------------------------------------------------------- Vitals Details Patient Name: Date of Service: Courtney Curd [name]. 09/05/2022 12:00 PM Medical Record Number: [id number] Patient Account Number: [id number] Date of Birth/Sex: Treating RN: 08-Feb-1940 (82 y.o. [name] Primary Care Gricel Copen: Bethann Punches Other Clinician: Referring Sharonda Llamas: Treating Deion Forgue/Extender: RO BSO N, MICHA EL Betsy Pries, Mark Weeks in Treatment: 2 Vital Signs Time Taken: 12:17 Temperature (F): 97.7 Height (in): 67 Pulse (bpm): 82 Weight (lbs): 180 Respiratory Rate (breaths/min): 18 Body Mass Index (BMI): 28.2 Blood Pressure  (mmHg): 141/79 Reference Range: 80 - 120 mg / dl Electronic Signature(s) Signed: 09/05/2022 6:06:34 PM By: Hurshel PartyMervyn Skeeters ([id number]) PM By: Angelina Pih Kyllie 09/05/2022 6:06:34 [id number] Page 9 of 9 Entered By: Angelina Pih on 09/05/2022 12:17:29

## 2022-09-14 ENCOUNTER — Encounter: Payer: Medicare Other | Attending: Physician Assistant | Admitting: Physician Assistant

## 2022-09-14 DIAGNOSIS — E11622 Type 2 diabetes mellitus with other skin ulcer: Secondary | ICD-10-CM | POA: Diagnosis not present

## 2022-09-14 DIAGNOSIS — I87033 Postthrombotic syndrome with ulcer and inflammation of bilateral lower extremity: Secondary | ICD-10-CM | POA: Diagnosis not present

## 2022-09-14 DIAGNOSIS — I7389 Other specified peripheral vascular diseases: Secondary | ICD-10-CM | POA: Insufficient documentation

## 2022-09-14 DIAGNOSIS — T8131XA Disruption of external operation (surgical) wound, not elsewhere classified, initial encounter: Secondary | ICD-10-CM | POA: Insufficient documentation

## 2022-09-14 DIAGNOSIS — X58XXXA Exposure to other specified factors, initial encounter: Secondary | ICD-10-CM | POA: Diagnosis not present

## 2022-09-14 DIAGNOSIS — I10 Essential (primary) hypertension: Secondary | ICD-10-CM | POA: Insufficient documentation

## 2022-09-14 DIAGNOSIS — L98492 Non-pressure chronic ulcer of skin of other sites with fat layer exposed: Secondary | ICD-10-CM | POA: Insufficient documentation

## 2022-09-15 NOTE — Progress Notes (Signed)
Courtney [name], Courtney [name] ([id number]) [id number] Page 1 of 7 Visit Report for 09/14/2022 Chief Complaint Document Details Patient Name: Date of Service: Courtney [name], Courtney [name] 09/14/2022 1:45 PM Medical Record Number: [id number] Patient Account Number: [id number] Date of Birth/Sex: Treating RN: Jan 25, 1940 (82 y.o. Courtney [name] Primary Care Provider: Bethann Punches Other Clinician: Betha Loa Referring Provider: Treating Provider/Extender: Margorie John in Treatment: 3 Information Obtained from: Patient Chief Complaint Left breast surgical ulcer Electronic Signature(s) Signed: 09/14/2022 2:22:53 PM By: Allen Derry PA-C Entered By: Allen Derry on 09/14/2022 11:22:53 -------------------------------------------------------------------------------- HPI Details Patient Name: Date of Service: Courtney Curd [name]. 09/14/2022 1:45 PM Medical Record Number: [id number] Patient Account Number: [id number] Date of Birth/Sex: Treating RN: 1940-04-09 (82 y.o. Courtney [name] Primary Care Provider: Bethann Punches Other Clinician: Betha Loa Referring Provider: Treating Provider/Extender: Margorie John in Treatment: 3 History of Present Illness HPI Description: 12-22-2021 upon evaluation today patient appears to be doing well currently in regard to her wound all things considered especially in light of the picture that they showed me from August when she initially had [name] skin tear. Fortunately there does not appear to be any signs of active infection locally nor systemically at this time which is great news and overall I am extremely pleased with the fact that she is making some progress here she has been placed on Cipro due to [name] culture result that was done by rheumatologist which showed that she had Pseudomonas and Staphylococcus aureus noted as the organisms in question. Fortunately I do not think that there is anything that seems to be still  present or significant here I think she is actually doing much better which is great news. With that being said the patient tells me that she has been using Medihoney at this point which actually is really good does help to clear up [name] lot of this I do believe. In the beginning she was also placed on or rather in an Unna boot wrap based on what we are being told that was for [name] couple of weeks and then she did not have any additional follow-up with primary eventually rheumatology who were doing injections for her realize that she had [name] wound they stopped the injections which obviously would lower her immune response and healing chances. That is when she started on the antibiotics and subsequently also was referred to vascular. The good news is from [name] vascular standpoint she seems to be probably sufficient to heal with [name] right ABI of 1.02 and [name] left ABI of 1.02 as well. Her TBI on the right was 0.63 and on the left 0.54. She does have [name] history of diabetes mellitus type 2 and she has an A1c of 6.5 on 10-17-2021. Other than this she also does have chronic venous insufficiency her leg is swollen today I definitely think [name] compression wraps probably can help her she also has hypertension 12-29-2021 upon evaluation patient's wound is actually showing signs of excellent improvement. Fortunately I do not see any signs of infection I think she is making good progress here and overall I do believe that she is really looking quite nice as far as the overall appearance of the wound bed is concerned. There Courtney [name] [name] ([id number]) [phone].pdf Page 2 of 7 is [name] little bit of necrotic tissue noted on the surface of the wound that is going require debridement today. 12/28; continued improvement in the area on the left lateral leg which was initially [name]  skin tear in the setting of chronic venous insufficiency. She has been using Prisma with 3 layer compression. The length of the wound is  down 0.5 cm 01-12-2022 upon evaluation today patient appears to be doing well currently in regard to her leg ulcer. She has been tolerating dressing changes without complication. Fortunately I do not see any signs of active infection locally nor systemically which is great news. No fevers, chills, nausea, vomiting, or diarrhea. 01-20-2022 upon evaluation today patient appears to be doing well currently in regard to her wound. In fact this is measuring significantly smaller and I am very pleased with where we stand. I do not see any signs of active infection locally or systemically at this time. No fevers, chills, nausea, vomiting, or diarrhea. 02-03-2022 upon evaluation today patient appears to be doing well currently in regard to her wound. In fact she appears to be completely healed based on what I am seeing. Fortunately I do not see any signs of active infection locally nor systemically which is great news. There is little piece of dried skin where the dressing had stuck once I remove this however there was nothing remaining underneath.' Readmission: 08-22-2022 this is an individual whom I am just seen actually at the beginning of the year although she healed this was in regard to her legs and her legs have continued to do quite well although she did have some issues with cellulitis but that was taking care of recently she has not been wearing her compression socks I did have [name] brief conversation with her about the fact that she needs to make sure to wear these on [name] regular basis she voiced understanding. Unfortunately the patient around January which is when I was seeing her of this past year started to have [name] mass noted in her left breast. Subsequent that in December she had had some nipple drainage although she did not pay too much attention to it even with [name] mass noted. By March she had bloody drainage coming out of this and there was the mass actually identified by way of I believe ultrasound at  that point. They went in from [name] surgical standpoint on June 10 and remove the mass the good news is this was not cancer the unfortunate news that she is having [name] difficult time getting this to heal following. She has not been told to pack this although she tells me she was put [name] little bit of gauze in herself. Fortunately I do not see any signs of active infection at this point which is good news. She did request [name] referral back to [place] to try to help get this closed as we have done well with her leg. Patient's most recent hemoglobin A1c was on 07-26-2022 and was 7.1 this is doing pretty well at this point in my opinion. Otherwise her past medical history really has not changed significantly. 08-29-2022 upon evaluation today patient appears to be doing pretty well currently in regard to the wound currently on the left breast location. She has been tolerating the dressing changes without complication and fortunately this actually seems to be much less deep compared to last week's evaluation. I am extremely pleased with where we stand today and I do not see any signs of active infection at this time. 8/27; patient who had multiple at least 2 biopsies and procedures for [name] mass in the left breast. 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 Nevertheless she  has been left with [name] nonhealing wound area just superior to the nipple area. We have been using Dakin's wet to dry. The patient complains of mild stinging at the orifice of the wound from the wet Dakin's wicked gauze 09-14-2022 upon evaluation today patient appears to be doing well with regard to the wound on her left breast. This actually appears to be very clean and I do believe is making excellent headway towards being completely closed. Fortunately I do not see any signs of infection at this time. No fevers, chills, nausea, vomiting, or diarrhea. Electronic Signature(s) Signed: 09/14/2022 2:48:21 PM By:  Allen Derry PA-C Entered By: Allen Derry on 09/14/2022 11:48:21 -------------------------------------------------------------------------------- Physical Exam Details Patient Name: Date of Service: Courtney [name], Courtney [name] 09/14/2022 1:45 PM Medical Record Number: [id number] Patient Account Number: [id number] Date of Birth/Sex: Treating RN: [birth year] (82 y.o. Courtney [name] Primary Care Provider: Bethann Punches Other Clinician: Betha Loa Referring Provider: Treating Provider/Extender: Margorie John in Treatment: 3 Constitutional Well-nourished and well-hydrated in no acute distress. Respiratory normal breathing without difficulty. Psychiatric this patient is able to make decisions and demonstrates good insight into disease process. Alert and Oriented x 3. pleasant and cooperative. Notes Upon inspection patient's wound bed showed signs of actually being really good and clean I do not see any evidence of active infection at this time and in [place] [name] ([id number]) [id number] Page 3 of 7 general I think that she has [name] clean surface internally. We may need to just try to get this to feeling at this point as can to take [name] little bit of time but we will do our best to make this happen as quickly as possible. Electronic Signature(s) Signed: 09/14/2022 2:48:42 PM By: Allen Derry PA-C Entered By: Allen Derry on 09/14/2022 11:48:41 -------------------------------------------------------------------------------- Physician Orders Details Patient Name: Date of Service: Courtney Curd [name]. 09/14/2022 1:45 PM Medical Record Number: [id number] Patient Account Number: [id number] Date of Birth/Sex: Treating RN: 1940-07-04 (82 y.o. Courtney [name] Primary Care Provider: Bethann Punches Other Clinician: Betha Loa Referring Provider: Treating Provider/Extender: Margorie John in Treatment: 3 Verbal / Phone Orders: Yes Clinician:  Angelina Pih Read Back and Verified: Yes Diagnosis Coding Follow-up Appointments Return Appointment in 1 week. Home Health Home Health Company: - [facility] for wound care. May utilize formulary equivalent dressing for wound treatment orders unless otherwise specified. Home Health Nurse may visit PRN to address patients wound care needs. Scheduled days for dressing changes to be completed; exception, patient has scheduled wound care visit that day. **Please direct any NON-WOUND related issues/requests for orders to patient's Primary Care Physician. **If current dressing causes regression in wound condition, may D/C ordered dressing product/s and apply Normal Saline Moist Dressing daily until next Wound Healing Center or Other MD appointment. **Notify Wound Healing Center of regression in wound condition at [phone]. Bathing/ Shower/ Hygiene May shower; gently cleanse wound with antibacterial soap, rinse and pat dry prior to dressing wounds No tub bath. Anesthetic (Use 'Patient Medications' Section for Anesthetic Order Entry) Lidocaine applied to wound bed Wound Treatment Wound #2 - Breast Wound Laterality: Left Cleanser: Byram Ancillary Kit - 15 Day Supply (Generic) 1 x Per Day/15 Days Discharge Instructions: Use supplies as instructed; Kit contains: (15) Saline Bullets; (15) 3x3 Gauze; 15 pr Gloves Cleanser: Soap and Water 1 x Per Day/15 Days Discharge Instructions: Gently cleanse wound with antibacterial soap, rinse and pat dry prior to dressing wounds Prim Dressing: Hydrofera Blue  Ready Transfer Foam, 4x5 (in/in) 1 x Per Day/15 Days ary Discharge Instructions: cut strip and tuck into wound Secondary Dressing: (BORDER) Zetuvit Plus SILICONE BORDER Dressing 5x5 (in/in) (Generic) 1 x Per Day/15 Days Discharge Instructions: Please do not put silicone bordered dressings under wraps. Use non-bordered dressing only. Electronic Signature(s) Signed: 09/14/2022 5:10:42  PM By: Betha Loa Signed: 09/15/2022 2:02:35 PM By: Allen Derry PA-C Entered By: Betha Loa on 09/14/2022 11:16:06 Courtney [name] ([id number]) [id number] Page 4 of 7 -------------------------------------------------------------------------------- Problem List Details Patient Name: Date of Service: Courtney [name], Courtney [name] 09/14/2022 1:45 PM Medical Record Number: [id number] Patient Account Number: [id number] Date of Birth/Sex: Treating RN: December 13, 1940 (82 y.o. 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 08/22/2022 No Yes extremity I73.89 Other specified peripheral vascular diseases 08/22/2022 No Yes I10 Essential (primary) hypertension 08/22/2022 No Yes Inactive Problems Resolved Problems Electronic Signature(s) Signed: 09/14/2022 2:22:48 PM By: Allen Derry PA-C Entered By: Allen Derry on 09/14/2022 11:22:48 Courtney [name] ([id number]) [id number] Page 5 of 7 -------------------------------------------------------------------------------- Progress Note Details Patient Name: Date of Service: Courtney [name], Courtney [name] 09/14/2022 1:45 PM Medical Record Number: [id number] Patient Account Number: [id number] Date of Birth/Sex: Treating RN: [birth year] (82 y.o. Courtney [name] Primary Care Provider: Bethann Punches Other Clinician: Betha Loa Referring  Provider: Treating Provider/Extender: Margorie John in Treatment: 3 Subjective Chief Complaint Information obtained from Patient Left breast surgical ulcer History of Present Illness (HPI) 12-22-2021 upon evaluation today patient appears to be doing well currently in regard to her wound all things considered especially in light of the picture that they showed me from August when she initially had [name] skin tear. Fortunately there does not appear to be any signs of active infection locally nor systemically at this time which is great news and overall I am extremely pleased with the fact that she is making some progress here she has been placed on Cipro due to [name] culture result that was done by rheumatologist which showed that she had Pseudomonas and Staphylococcus aureus noted as the organisms in question. Fortunately I do not think that there is anything that seems to be still present or significant here I think she is actually doing much better which is great news. With that being said the patient tells me that she has been using Medihoney at this point which actually is really good does help to clear up [name] lot of this I do believe. In the beginning she was also placed on or rather in an Unna boot wrap based on what we are being told that was for [name] couple of weeks and then she did not have any additional follow-up with primary eventually rheumatology who were doing injections for her realize that she had [name] wound they stopped the injections which obviously would lower her immune response and healing chances. That is when she started on the antibiotics and subsequently also was referred to vascular. The good news is from [name] vascular standpoint she seems to be probably sufficient to heal with [name] right ABI  of 1.02 and [name] left ABI of 1.02 as well. Her TBI on the right was 0.63 and on the left 0.54. She does have [name] history of diabetes mellitus type 2 and she has an A1c of 6.5 on 10-17-2021.  Other than this she also does have chronic venous insufficiency her leg is swollen today I definitely think [name] compression wraps probably can help her she also has hypertension 12-29-2021 upon evaluation patient's wound is actually showing signs of excellent improvement. Fortunately I do not see any signs of infection I think she is making good progress here and overall I do believe that she is really looking quite nice as far as the overall appearance of the wound bed is concerned. There is [name] little bit of necrotic tissue noted on the surface of the wound that is going require debridement today. 12/28; continued improvement in the area on the left lateral leg which was initially [name] skin tear in the setting of chronic venous insufficiency. She has been using Prisma with 3 layer compression. The length of the wound is down 0.5 cm 01-12-2022 upon evaluation today patient appears to be doing well currently in regard to her leg ulcer. She has been tolerating dressing changes without complication. Fortunately I do not see any signs of active infection locally nor systemically which is great news. No fevers, chills, nausea, vomiting, or diarrhea. 01-20-2022 upon evaluation today patient appears to be doing well currently in regard to her wound. In fact this is measuring significantly smaller and I am very pleased with where we stand. I do not see any signs of active infection locally or systemically at this time. No fevers, chills, nausea, vomiting, or diarrhea. 02-03-2022 upon evaluation today patient appears to be doing well currently in regard to her wound. In fact she appears to be completely healed based on what I am seeing. Fortunately I do not see any signs of active infection locally nor systemically which is great news. There is little piece of dried skin where the dressing had stuck once I remove this however there was nothing remaining underneath.' Readmission: 08-22-2022 this is an individual whom I  am just seen actually at the beginning of the year although she healed this was in regard to her legs and her legs have continued to do quite well although she did have some issues with cellulitis but that was taking care of recently she has not been wearing her compression socks I did have [name] brief conversation with her about the fact that she needs to make sure to wear these on [name] regular basis she voiced understanding. Unfortunately the patient around January which is when I was seeing her of this past year started to have [name] mass noted in her left breast. Subsequent that in December she had had some nipple drainage although she did not pay too much attention to it even with [name] mass noted. By March she had bloody drainage coming out of this and there was the mass actually identified by way of I believe ultrasound at that point. They went in from [name] surgical standpoint on June 10 and remove the mass the good news is this was not cancer the unfortunate news that she is having [name] difficult time getting this to heal following. She has not been told to pack this although she tells me she was put [name] little bit of gauze in herself. Fortunately I do not see any signs of active infection at this point which is good news.  She did request [name] referral back to [place] to try to help get this closed as we have done well with her leg. Patient's most recent hemoglobin A1c was on 07-26-2022 and was 7.1 this is doing pretty well at this point in my opinion. Otherwise her past medical history really has not changed significantly. 08-29-2022 upon evaluation today patient appears to be doing pretty well currently in regard to the wound currently on the left breast location. She has been tolerating the dressing changes without complication and fortunately this actually seems to be much less deep compared to last week's evaluation. I am extremely pleased with where we stand today and I do not see any signs of active infection at this  time. 8/27; patient who had multiple at least 2 biopsies and procedures for [name] mass in the left breast. 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 Nevertheless she has been left with [name] nonhealing wound area just superior to the nipple area. We have been using Dakin's wet to dry. The patient complains of mild stinging at the orifice of the wound from the wet Dakin's wicked gauze 09-14-2022 upon evaluation today patient appears to be doing well with regard to the wound on her left breast. This actually appears to be very clean and I do believe is making excellent headway towards being completely closed. Fortunately I do not see any signs of infection at this time. No fevers, chills, nausea, vomiting, or diarrhea. Objective Courtney [name], Courtney [name] ([id number]) [id number] Page 6 of 7 Constitutional Well-nourished and well-hydrated in no acute distress. Vitals Time Taken: 1:49 PM, Height: 67 in, Weight: 180 lbs, BMI: 28.2, Temperature: 98.3 F, Pulse: 80 bpm, Respiratory Rate: 18 breaths/min, Blood Pressure: 177/81 mmHg. Respiratory normal breathing without difficulty. Psychiatric this patient is able to make decisions and demonstrates good insight into disease process. Alert and Oriented x 3. pleasant and cooperative. General Notes: Upon inspection patient's wound bed showed signs of actually being really good and clean I do not see any evidence of active infection at this time and in general I think that she has [name] clean surface internally. We may need to just try to get this to feeling at this point as can to take [name] little bit of time but we will do our best to make this happen as quickly as possible. Integumentary (Hair, Skin) Wound #2 status is Open. Original cause of wound was Surgical Injury. The date acquired was: 06/19/2022. The wound has been in treatment 3 weeks. The wound is located on the Left Breast. The wound measures  1.5cm length x 0.5cm width x 2.1cm depth; 0.589cm^2 area and 1.237cm^3 volume. There is Fat Layer (Subcutaneous Tissue) exposed. There is [name] medium amount of serosanguineous drainage noted. There is medium (34-66%) red granulation within the wound bed. There is [name] medium (34-66%) amount of necrotic tissue within the wound bed including Adherent Slough. 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 Home Health: Home Health Company: - [facility] for wound care. May utilize formulary equivalent dressing for wound treatment orders unless otherwise specified. Home Health Nurse may visit PRN to address patients wound care needs.  Scheduled days for dressing changes to be completed; exception, patient has scheduled wound care visit that day. **Please direct any NON-WOUND related issues/requests for orders to patient's Primary Care Physician. **If current dressing causes regression in wound condition, may D/C ordered dressing product/s and apply Normal Saline Moist Dressing daily until next Wound Healing Center or Other MD appointment. **Notify Wound Healing Center of regression in wound condition at [phone]. Bathing/ Shower/ Hygiene: May shower; gently cleanse wound with antibacterial soap, rinse and pat dry prior to dressing wounds No tub bath. Anesthetic (Use 'Patient Medications' Section for Anesthetic Order Entry): Lidocaine applied to wound bed WOUND #2: - Breast Wound Laterality: Left Cleanser: Byram Ancillary Kit - 15 Day Supply (Generic) 1 x Per Day/15  Days Discharge Instructions: Use supplies as instructed; Kit contains: (15) Saline Bullets; (15) 3x3 Gauze; 15 pr Gloves Cleanser: Soap and Water 1 x Per Day/15 Days Discharge Instructions: Gently cleanse wound with antibacterial soap, rinse and pat dry prior to dressing wounds Prim Dressing: Hydrofera Blue Ready Transfer Foam, 4x5 (in/in) 1 x Per Day/15 Days ary Discharge Instructions: cut strip and tuck into wound Secondary Dressing: (BORDER) Zetuvit Plus SILICONE BORDER Dressing 5x5 (in/in) (Generic) 1 x Per Day/15 Days Discharge Instructions: Please do not put silicone bordered dressings under wraps. Use non-bordered dressing only. 1. I would recommend that we continue with the recommendation now for [name] Hydrofera Blue dressing packed into the left breast area. She is in agreement with that plan. 2. I am also can recommend patient should continue to change this daily for now will probably go to every other day shortly but she is to be out of town next week and therefore did not want make any drastic changes at this point. We will see patient back for reevaluation in 1 week here in the clinic. If anything worsens or changes patient will contact our office for additional recommendations. Electronic Signature(s) Signed: 09/14/2022 2:49:09 PM By: Allen Derry PA-C Entered By: Allen Derry on 09/14/2022 11:49:09 Courtney [name] ([id number]) [id number] Page 7 of 7 -------------------------------------------------------------------------------- SuperBill Details Patient Name: Date of Service: Courtney [name], Courtney [name] 09/14/2022 Medical Record Number: [id number] Patient Account Number: [id number] Date of Birth/Sex: Treating RN: 08/20/1940 (82 y.o. 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

## 2022-09-15 NOTE — Progress Notes (Signed)
FINOLA, STATZER [name] ([id number]) [id number] Page 1 of 9 Visit Report for 09/14/2022 Arrival Information Details Patient Name: Date of Service: Courtney [name], Courtney [name] 09/14/2022 1:45 PM Medical Record Number: [id number] Patient Account Number: [id number] Date of Birth/Sex: Treating RN: 18-Feb-1940 (82 y.o. [name] Primary Care Kolina Kube: Bethann Punches Other Clinician: Betha Loa Referring Sherline Eberwein: Treating Eldonna Neuenfeldt/Extender: Margorie John in Treatment: 3 Visit Information History Since Last Visit All ordered tests and consults were completed: No Patient Arrived: Gilmer Mor Added or deleted any medications: No Arrival Time: 13:42 Any new allergies or adverse reactions: No Transfer Assistance: None Had [name] fall or experienced change in No Patient Identification Verified: Yes activities of daily living that may affect Secondary Verification Process Completed: Yes risk of falls: Patient Requires Transmission-Based No Signs or symptoms of abuse/neglect since last visito No Precautions: Hospitalized since last visit: No Patient Has Alerts: Yes Implantable device outside of the clinic excluding No Patient Alerts: Patient on Blood Thinner cellular tissue based products placed in the center Left breast mass since last visit: removed Has Dressing in Place as Prescribed: Yes Pain Present Now: No Electronic Signature(s) Signed: 09/14/2022 5:10:42 PM By: Betha Loa Entered By: Betha Loa on 09/14/2022 10:48:38 -------------------------------------------------------------------------------- Clinic Level of Care Assessment Details Patient Name: Date of Service: Courtney [name] 09/14/2022 1:45 PM Medical Record Number: [id number] Patient Account Number: [id number] Date of Birth/Sex: Treating RN: 07/13/1940 (82 y.o. [name] Primary Care Resa Rinks: Bethann Punches Other Clinician: Betha Loa Referring Anyeli Hockenbury: Treating  Della Scrivener/Extender: Margorie John in Treatment: 3 Clinic Level of Care Assessment Items TOOL 4 Quantity Score []  - 0 Use when only an EandM is performed on FOLLOW-UP visit ASSESSMENTS - Nursing Assessment / Reassessment X- 1 10 Reassessment of Co-morbidities (includes updates in patient status) X- 1 5 Reassessment of Adherence to Treatment Plan Courtney [name] ([id number]) [id number] Page 2 of 9 ASSESSMENTS - Wound and Skin [name] ssessment / Reassessment X - Simple Wound Assessment / Reassessment - one wound 1 5 []  - 0 Complex Wound Assessment / Reassessment - multiple wounds []  - 0 Dermatologic / Skin Assessment (not related to wound area) ASSESSMENTS - Focused Assessment []  - 0 Circumferential Edema Measurements - multi extremities []  - 0 Nutritional Assessment / Counseling / Intervention []  - 0 Lower Extremity Assessment (monofilament, tuning fork, pulses) []  - 0 Peripheral Arterial Disease Assessment (using hand held doppler) ASSESSMENTS - Ostomy and/or Continence Assessment and Care []  - 0 Incontinence Assessment and Management []  - 0 Ostomy Care Assessment and Management (repouching, etc.) PROCESS - Coordination of Care X - Simple Patient / Family Education for ongoing care 1 15 []  - 0 Complex (extensive) Patient / Family Education for ongoing care []  - 0 Staff obtains Chiropractor, Records, T Results / Process Orders est []  - 0 Staff telephones HHA, Nursing Homes / Clarify orders / etc []  - 0 Routine Transfer to another Facility (non-emergent condition) []  - 0 Routine Hospital Admission (non-emergent condition) []  - 0 New Admissions / Manufacturing engineer / Ordering NPWT Apligraf, etc. , []  - 0 Emergency Hospital Admission (emergent condition) X- 1 10 Simple Discharge Coordination []  - 0 Complex (extensive) Discharge Coordination PROCESS - Special Needs []  - 0 Pediatric / Minor Patient Management []  - 0 Isolation  Patient Management []  - 0 Hearing / Language / Visual special needs []  - 0 Assessment of Community assistance (transportation, D/C planning, etc.) []  - 0 Additional assistance / Altered mentation []  - 0 Support  Surface(s) Assessment (bed, cushion, seat, etc.) INTERVENTIONS - Wound Cleansing / Measurement X - Simple Wound Cleansing - one wound 1 5 []  - 0 Complex Wound Cleansing - multiple wounds X- 1 5 Wound Imaging (photographs - any number of wounds) []  - 0 Wound Tracing (instead of photographs) X- 1 5 Simple Wound Measurement - one wound []  - 0 Complex Wound Measurement - multiple wounds INTERVENTIONS - Wound Dressings []  - 0 Small Wound Dressing one or multiple wounds X- 1 15 Medium Wound Dressing one or multiple wounds []  - 0 Large Wound Dressing one or multiple wounds []  - 0 Application of Medications - topical []  - 0 Application of Medications - injection INTERVENTIONS - Miscellaneous []  - 0 External ear exam SHAHIDAH, HANIF [name] ([id number]) [id number] Page 3 of 9 []  - 0 Specimen Collection (cultures, biopsies, blood, body fluids, etc.) []  - 0 Specimen(s) / Culture(s) sent or taken to Lab for analysis []  - 0 Patient Transfer (multiple staff / Michiel Sites Lift / Similar devices) []  - 0 Simple Staple / Suture removal (25 or less) []  - 0 Complex Staple / Suture removal (26 or more) []  - 0 Hypo / Hyperglycemic Management (close monitor of Blood Glucose) []  - 0 Ankle / Brachial Index (ABI) - do not check if billed separately X- 1 5 Vital Signs Has the patient been seen at the hospital within the last three years: Yes Total Score: 80 Level Of Care: New/Established - Level 3 Electronic Signature(s) Signed: 09/14/2022 5:10:42 PM By: Betha Loa Entered By: Betha Loa on 09/14/2022 11:17:11 -------------------------------------------------------------------------------- Encounter Discharge Information Details Patient Name: Date of  Service: Courtney Curd [name]. 09/14/2022 1:45 PM Medical Record Number: [id number] Patient Account Number: [id number] Date of Birth/Sex: Treating RN: 1940-10-06 (82 y.o. [name] Primary Care Rusty Glodowski: Bethann Punches Other Clinician: Betha Loa Referring Jermani Pund: Treating Josephyne Tarter/Extender: Margorie John in Treatment: 3 Encounter Discharge Information Items Discharge Condition: Stable Ambulatory Status: Cane Discharge Destination: Home Transportation: Private Auto Accompanied By: husband Schedule Follow-up Appointment: Yes Clinical Summary of Care: Electronic Signature(s) Signed: 09/14/2022 5:10:42 PM By: Betha Loa Entered By: Betha Loa on 09/14/2022 11:26:50 Lower Extremity Assessment Details -------------------------------------------------------------------------------- Mendel Corning ([id number]) [id number] Page 4 of 9 Patient Name: Date of Service: NKECHINYERE, FERDERER 09/14/2022 1:45 PM Medical Record Number: [id number] Patient Account Number: [id number] Date of Birth/Sex: Treating RN: May 26, 1940 (82 y.o. [name] Primary Care Kylen Schliep: Bethann Punches Other Clinician: Betha Loa Referring Haig Gerardo: Treating Levern Kalka/Extender: Margorie John in Treatment: 3 Electronic Signature(s) Signed: 09/14/2022 4:38:23 PM By: Angelina Pih Signed: 09/14/2022 5:10:42 PM By: Betha Loa Entered By: Betha Loa on 09/14/2022 11:01:00 -------------------------------------------------------------------------------- Multi Wound Chart Details Patient Name: Date of Service: Courtney Curd [name]. 09/14/2022 1:45 PM Medical Record Number: [id number] Patient Account Number: [id number] Date of Birth/Sex: Treating RN: [birth year] (82 y.o. [name] Primary Care Estanislado Surgeon: Bethann Punches Other Clinician: Betha Loa Referring Remmy Crass: Treating Kaveri Perras/Extender: Margorie John in  Treatment: 3 Vital Signs Height(in): 67 Pulse(bpm): 80 Weight(lbs): 180 Blood Pressure(mmHg): 177/81 Body Mass Index(BMI): 28.2 Temperature(F): 98.3 Respiratory Rate(breaths/min): 18 [2:Photos:] [N/[name]:N/[name]] Left Breast N/[name] N/[name] Wound Location: Surgical Injury N/[name] N/[name] Wounding Event: Open Surgical Wound N/[name] N/[name] Primary Etiology: Sleep Apnea, Hypertension, Peripheral N/[name] N/[name] Comorbid History: Arterial Disease, Type II Diabetes, Rheumatoid Arthritis, Osteoarthritis, Neuropathy 06/19/2022 N/[name] N/[name] Date Acquired: 3 N/[name] N/[name] Weeks of Treatment: Open N/[name] N/[name] Wound Status: No N/[name] N/[name] Wound Recurrence: 1.5x0.5x2.1 N/[name] N/[name] Measurements L x  W x D (cm) 0.589 N/[name] N/[name] [name] (cm) : rea 1.237 N/[name] N/[name] Volume (cm) : 70.00% N/[name] N/[name] % Reduction in Area: 84.30% N/[name] N/[name] % Reduction in Volume: Full Thickness With Exposed Support N/[name] N/[name] Classification: Structures Medium N/[name] N/[name] Exudate Amount: Serosanguineous N/[name] N/[name] Exudate Type: red, brown N/[name] N/[name] Exudate Color: Medium (34-66%) N/[name] N/[name] Granulation Amount: Red N/[name] N/[name] Granulation Quality: Medium (34-66%) N/[name] N/[name] Necrotic Amount: SABLE, KUSS [name] ([id number]) [id number] Page 5 of 9 Fat Layer (Subcutaneous Tissue): Yes N/[name] N/[name] Exposed Structures: None N/[name] N/[name] Epithelialization: Treatment Notes Electronic Signature(s) Signed: 09/14/2022 5:10:42 PM By: Betha Loa Entered By: Betha Loa on 09/14/2022 11:01:05 -------------------------------------------------------------------------------- Multi-Disciplinary Care Plan Details Patient Name: Date of Service: Courtney Curd [name]. 09/14/2022 1:45 PM Medical Record Number: [id number] Patient Account Number: [id number] Date of Birth/Sex: Treating RN: 1940-04-13 (82 y.o. [name] Primary Care Alfard Cochrane: Bethann Punches Other Clinician: Betha Loa Referring Reis Pienta: Treating Silus Lanzo/Extender: Margorie John in Treatment: 3 Active  Inactive Wound/Skin Impairment Nursing Diagnoses: Impaired tissue integrity Knowledge deficit related to ulceration/compromised skin integrity Goals: Ulcer/skin breakdown will have [name] volume reduction of 30% by week 4 Date Initiated: 08/22/2022 Target Resolution Date: 09/19/2022 Goal Status: Active Ulcer/skin breakdown will have [name] volume reduction of 50% by week 8 Date Initiated: 08/22/2022 Target Resolution Date: 10/17/2022 Goal Status: Active Ulcer/skin breakdown will have [name] volume reduction of 80% by week 12 Date Initiated: 08/22/2022 Target Resolution Date: 11/14/2022 Goal Status: Active Ulcer/skin breakdown will heal within 14 weeks Date Initiated: 08/22/2022 Target Resolution Date: 11/28/2022 Goal Status: Active Interventions: Assess patient/caregiver ability to obtain necessary supplies Assess patient/caregiver ability to perform ulcer/skin care regimen upon admission and as needed Assess ulceration(s) every visit Provide education on ulcer and skin care Treatment Activities: Skin care regimen initiated : 08/22/2022 Notes: Electronic Signature(s) Signed: 09/14/2022 4:38:23 PM By: Angelina Pih Signed: 09/14/2022 5:10:42 PM By: Betha Loa Entered By: Betha Loa on 09/14/2022 11:17:30 Jeffcoat, Imojean [name] ([id number]) [id number] Page 6 of 9 -------------------------------------------------------------------------------- Pain Assessment Details Patient Name: Date of Service: KARMEL, BOWEN 09/14/2022 1:45 PM Medical Record Number: [id number] Patient Account Number: [id number] Date of Birth/Sex: Treating RN: [birth year] (82 y.o. [name] Primary Care Aluel Schwarz: Bethann Punches Other Clinician: Betha Loa Referring Prynce Jacober: Treating Reveca Desmarais/Extender: Margorie John in Treatment: 3 Active Problems Location of Pain Severity and Description of Pain Patient Has Paino No Site Locations Pain Management and  Medication Current Pain Management: Electronic Signature(s) Signed: 09/14/2022 4:38:23 PM By: Angelina Pih Signed: 09/14/2022 5:10:42 PM By: Betha Loa Entered By: Betha Loa on 09/14/2022 10:51:17 -------------------------------------------------------------------------------- Patient/Caregiver Education Details Patient Name: Date of Service: Annamaria Boots 9/5/2024andnbsp1:45 PM Medical Record Number: [id number] Patient Account Number: [id number] Date of Birth/Gender: Treating RN: 08-10-1940 (82 y.o. [name] Primary Care Physician: Bethann Punches Other Clinician: Betha Loa Referring Physician: Treating Physician/Extender: Margorie John in Treatment: 3 JODI, MCGURL [name] ([id number]) [id number] Page 7 of 9 Education Assessment Education Provided To: Patient Education Topics Provided Wound/Skin Impairment: Handouts: Other: continue wound care as directed Methods: Explain/Verbal Responses: State content correctly Electronic Signature(s) Signed: 09/14/2022 5:10:42 PM By: Betha Loa Entered By: Betha Loa on 09/14/2022 11:26:06 -------------------------------------------------------------------------------- Wound Assessment Details Patient Name: Date of Service: CAROL, SIDNEY 09/14/2022 1:45 PM Medical Record Number: [id number] Patient Account Number: [id number] Date of Birth/Sex: Treating RN: 1940/04/01 (82 y.o. [name] Primary Care Haizley Cannella: Bethann Punches Other Clinician: Betha Loa Referring Verla Bryngelson: Treating Jozeph Persing/Extender: Larina Bras,  Fleet Contras, Mark Weeks in Treatment: 3 Wound Status Wound Number: 2 Primary Open Surgical Wound Etiology: Wound Location: Left Breast Wound Open Wounding Event: Surgical Injury Status: Date Acquired: 06/19/2022 Comorbid Sleep Apnea, Hypertension, Peripheral Arterial Disease, Type II Weeks Of Treatment: 3 History: Diabetes, Rheumatoid Arthritis,  Osteoarthritis, Neuropathy Clustered Wound: No Photos Wound Measurements Length: (cm) 1.5 Width: (cm) 0.5 Depth: (cm) 2.1 Area: (cm) 0.589 Volume: (cm) 1.237 % Reduction in Area: 70% % Reduction in Volume: 84.3% Epithelialization: None Wound Description Classification: Full Thickness With Exposed Support Structures Exudate Amount: Medium Exudate Type: Serosanguineous Wilkinson, Liley [name] ([id number]) Exudate Color: red, brown Foul Odor After Cleansing: No Slough/Fibrino Yes [id number] Page 8 of 9 Wound Bed Granulation Amount: Medium (34-66%) Exposed Structure Granulation Quality: Red Fat Layer (Subcutaneous Tissue) Exposed: Yes Necrotic Amount: Medium (34-66%) Necrotic Quality: Adherent Slough Treatment Notes Wound #2 (Breast) Wound Laterality: Left Cleanser Byram Ancillary Kit - 15 Day Supply Discharge Instruction: Use supplies as instructed; Kit contains: (15) Saline Bullets; (15) 3x3 Gauze; 15 pr Gloves Soap and Water Discharge Instruction: Gently cleanse wound with antibacterial soap, rinse and pat dry prior to dressing wounds Peri-Wound Care Topical Primary Dressing Hydrofera Blue Ready Transfer Foam, 4x5 (in/in) Discharge Instruction: cut strip and tuck into wound Secondary Dressing (BORDER) Zetuvit Plus SILICONE BORDER Dressing 5x5 (in/in) Discharge Instruction: Please do not put silicone bordered dressings under wraps. Use non-bordered dressing only. Secured With Compression Wrap Compression Stockings Add-Ons Electronic Signature(s) Signed: 09/14/2022 4:38:23 PM By: Angelina Pih Signed: 09/14/2022 5:10:42 PM By: Betha Loa Entered By: Betha Loa on 09/14/2022 11:00:48 -------------------------------------------------------------------------------- Vitals Details Patient Name: Date of Service: Courtney Curd [name]. 09/14/2022 1:45 PM Medical Record Number: [id number] Patient Account Number: [id number] Date of Birth/Sex: Treating  RN: 1940/02/15 (82 y.o. [name] Primary Care Ronnetta Currington: Bethann Punches Other Clinician: Betha Loa Referring Calinda Stockinger: Treating Makayla Lanter/Extender: Margorie John in Treatment: 3 Vital Signs Time Taken: 13:49 Temperature (F): 98.3 Height (in): 67 Pulse (bpm): 80 Weight (lbs): 180 Respiratory Rate (breaths/min): 18 Body Mass Index (BMI): 28.2 Blood Pressure (mmHg): 177/81 Reference Range: 80 - 120 mg / dl Electronic Signature(s) Erazo, Meiling [name] ([id number]) [id number].pdf Page 9 of 9 Signed: 09/14/2022 5:10:42 PM By: Betha Loa Entered By: Betha Loa on 09/14/2022 10:51:10

## 2022-09-28 ENCOUNTER — Encounter: Payer: Medicare Other | Admitting: Physician Assistant

## 2022-09-28 DIAGNOSIS — T8131XA Disruption of external operation (surgical) wound, not elsewhere classified, initial encounter: Secondary | ICD-10-CM | POA: Diagnosis not present

## 2022-09-28 NOTE — Progress Notes (Signed)
1.4 Depth: (cm) 1.6 Area: (cm) 0.44 Volume: (cm) 0.704 % Reduction in Area: 77.6% % Reduction in Volume: 91% Epithelialization: None Tunneling: No Undermining: No Wound Description Classification: Full Thickness With Exposed Support Exudate Amount: Medium Exudate Type: Serosanguineous Lukic, Etheleen A  ([id number]) Exudate Color: red, brown Structures Foul Odor After Cleansing: No Slough/Fibrino Yes [id number] Page 8 of 9 Wound Bed Granulation Amount: Large (67-100%) Exposed Structure Granulation Quality: Pink, Friable Fat Layer (Subcutaneous Tissue) Exposed: Yes Necrotic Amount: Small (1-33%) Necrotic Quality: Adherent Slough Treatment Notes Wound #2 (Breast) Wound Laterality: Left Cleanser Byram Ancillary Kit - 15 Day Supply Discharge Instruction: Use supplies as instructed; Kit contains: (15) Saline Bullets; (15) 3x3 Gauze; 15 pr Gloves Soap and Water Discharge Instruction: Gently cleanse wound with antibacterial soap, rinse and pat dry prior to dressing wounds Peri-Wound Care Topical Primary Dressing Gauze Discharge Instruction: As directed: moistened with Dakins Solution Secondary Dressing (BORDER) Zetuvit Plus SILICONE BORDER Dressing 5x5 (in/in) Discharge Instruction: Please do not put silicone bordered dressings under wraps. Use non-bordered dressing only. Secured With Compression Wrap Compression Stockings Add-Ons Electronic Signature(s) Signed: 09/28/2022 4:38:26 PM By: Angelina Pih Entered By: Angelina Pih on 09/28/2022 13:18:09 -------------------------------------------------------------------------------- Vitals Details Patient Name: Date of Service: Courtney Curd A. 09/28/2022 1:00 PM Medical Record Number: [id number] Patient Account Number: [id number] Date of Birth/Sex: Treating RN: November 05, 1940 (82 y.o. [name] Primary Care Ariele Vidrio: Bethann Punches Other Clinician: Referring Raquel Sayres: Treating Benford Asch/Extender: Margorie John in Treatment: 5 Vital Signs Time Taken: 13:08 Temperature (F): 97.7 Height (in): 67 Pulse (bpm): 67 Weight (lbs): 180 Respiratory Rate (breaths/min): 18 Body Mass Index (BMI): 28.2 Blood Pressure (mmHg): 133/54 Reference Range: 80 - 120 mg / dl Electronic  Signature(s) Signed: 09/28/2022 4:38:26 PM By: Hurshel PartyMervyn Skeeters ([id number]) PM By: Angelina Pih Toshia 09/28/2022 4:38:26 [id number] Page 9 of 9 Entered By: Angelina Pih on 09/28/2022 13:09:11  CHAELA, QUINTELA A ([id number]) [id number] Page 1 of 9 Visit Report for 09/28/2022 Arrival Information Details Patient Name: Date of Service: Courtney [name] 09/28/2022 1:00 PM Medical Record Number: [id number] Patient Account Number: [id number] Date of Birth/Sex: Treating RN: [birth year] (82 y.o. [name] Primary Care Marithza Malachi: Bethann Punches Other Clinician: Referring Lizvette Lightsey: Treating Leana Springston/Extender: Margorie John in Treatment: 5 Visit Information History Since Last Visit Added or deleted any medications: No Patient Arrived: [place] Mor Any new allergies or adverse reactions: No Arrival Time: 13:08 Had a fall or experienced change in No Accompanied By: spouse activities of daily living that may affect Transfer Assistance: None risk of falls: Patient Identification Verified: Yes Hospitalized since last visit: No Secondary Verification Process Completed: Yes Has Dressing in Place as Prescribed: Yes Patient Requires Transmission-Based No Pain Present Now: No Precautions: Patient Has Alerts: Yes Patient Alerts: Patient on Blood Thinner Left breast mass removed Electronic Signature(s) Signed: 09/28/2022 4:38:26 PM By: Angelina Pih Entered By: Angelina Pih on 09/28/2022 13:08:30 -------------------------------------------------------------------------------- Clinic Level of Care Assessment Details Patient Name: Date of Service: [name], [name] 09/28/2022 1:00 PM Medical Record Number: [id number] Patient Account Number: [id number] Date of Birth/Sex: Treating RN: [birth year] (82 y.o. [name] Primary Care Frazer Rainville: Bethann Punches Other Clinician: Referring Nadiah Corbit: Treating Ryatt Corsino/Extender: Margorie John in Treatment: 5 Clinic Level of Care Assessment Items TOOL 4 Quantity Score []  - 0 Use when only an EandM is performed on FOLLOW-UP visit ASSESSMENTS - Nursing Assessment / Reassessment X- 1  10 Reassessment of Co-morbidities (includes updates in patient status) X- 1 5 Reassessment of Adherence to Treatment Plan ASSESSMENTS - Wound and Skin Assessment / Reassessment AJADA, GHALI A ([id number]) [id number] Page 2 of 9 X- 1 5 Simple Wound Assessment / Reassessment - one wound []  - 0 Complex Wound Assessment / Reassessment - multiple wounds []  - 0 Dermatologic / Skin Assessment (not related to wound area) ASSESSMENTS - Focused Assessment []  - 0 Circumferential Edema Measurements - multi extremities []  - 0 Nutritional Assessment / Counseling / Intervention []  - 0 Lower Extremity Assessment (monofilament, tuning fork, pulses) []  - 0 Peripheral Arterial Disease Assessment (using hand held doppler) ASSESSMENTS - Ostomy and/or Continence Assessment and Care []  - 0 Incontinence Assessment and Management []  - 0 Ostomy Care Assessment and Management (repouching, etc.) PROCESS - Coordination of Care X - Simple Patient / Family Education for ongoing care 1 15 []  - 0 Complex (extensive) Patient / Family Education for ongoing care X- 1 10 Staff obtains Chiropractor, Records, T Results / Process Orders est []  - 0 Staff telephones HHA, Nursing Homes / Clarify orders / etc []  - 0 Routine Transfer to another Facility (non-emergent condition) []  - 0 Routine Hospital Admission (non-emergent condition) []  - 0 New Admissions / Manufacturing engineer / Ordering NPWT Apligraf, etc. , []  - 0 Emergency Hospital Admission (emergent condition) X- 1 10 Simple Discharge Coordination []  - 0 Complex (extensive) Discharge Coordination PROCESS - Special Needs []  - 0 Pediatric / Minor Patient Management []  - 0 Isolation Patient Management []  - 0 Hearing / Language / Visual special needs []  - 0 Assessment of Community assistance (transportation, D/C planning, etc.) []  - 0 Additional assistance / Altered mentation []  - 0 Support Surface(s) Assessment (bed,  cushion, seat, etc.) INTERVENTIONS - Wound Cleansing / Measurement X - Simple Wound Cleansing - one wound 1 5 []  - 0 Complex Wound Cleansing - multiple wounds X- 1 5 Wound Imaging (photographs - any  CHAELA, QUINTELA A ([id number]) [id number] Page 1 of 9 Visit Report for 09/28/2022 Arrival Information Details Patient Name: Date of Service: Courtney [name] 09/28/2022 1:00 PM Medical Record Number: [id number] Patient Account Number: [id number] Date of Birth/Sex: Treating RN: [birth year] (82 y.o. [name] Primary Care Marithza Malachi: Bethann Punches Other Clinician: Referring Lizvette Lightsey: Treating Leana Springston/Extender: Margorie John in Treatment: 5 Visit Information History Since Last Visit Added or deleted any medications: No Patient Arrived: Gilmer Mor Any new allergies or adverse reactions: No Arrival Time: 13:08 Had a fall or experienced change in No Accompanied By: spouse activities of daily living that may affect Transfer Assistance: None risk of falls: Patient Identification Verified: Yes Hospitalized since last visit: No Secondary Verification Process Completed: Yes Has Dressing in Place as Prescribed: Yes Patient Requires Transmission-Based No Pain Present Now: No Precautions: Patient Has Alerts: Yes Patient Alerts: Patient on Blood Thinner Left breast mass removed Electronic Signature(s) Signed: 09/28/2022 4:38:26 PM By: Angelina Pih Entered By: Angelina Pih on 09/28/2022 13:08:30 -------------------------------------------------------------------------------- Clinic Level of Care Assessment Details Patient Name: Date of Service: [name], [name] 09/28/2022 1:00 PM Medical Record Number: [id number] Patient Account Number: [id number] Date of Birth/Sex: Treating RN: [birth year] (82 y.o. [name] Primary Care Frazer [place]: Bethann Punches Other Clinician: Referring Nadiah Corbit: Treating Ryatt Corsino/Extender: Margorie John in Treatment: 5 Clinic Level of Care Assessment Items TOOL 4 Quantity Score []  - 0 Use when only an EandM is performed on FOLLOW-UP visit ASSESSMENTS - Nursing Assessment / Reassessment X- 1  10 Reassessment of Co-morbidities (includes updates in patient status) X- 1 5 Reassessment of Adherence to Treatment Plan ASSESSMENTS - Wound and Skin Assessment / Reassessment AJADA, GHALI A ([id number]) [id number] Page 2 of 9 X- 1 5 Simple Wound Assessment / Reassessment - one wound []  - 0 Complex Wound Assessment / Reassessment - multiple wounds []  - 0 Dermatologic / Skin Assessment (not related to wound area) ASSESSMENTS - Focused Assessment []  - 0 Circumferential Edema Measurements - multi extremities []  - 0 Nutritional Assessment / Counseling / Intervention []  - 0 Lower Extremity Assessment (monofilament, tuning fork, pulses) []  - 0 Peripheral Arterial Disease Assessment (using hand held doppler) ASSESSMENTS - Ostomy and/or Continence Assessment and Care []  - 0 Incontinence Assessment and Management []  - 0 Ostomy Care Assessment and Management (repouching, etc.) PROCESS - Coordination of Care X - Simple Patient / Family Education for ongoing care 1 15 []  - 0 Complex (extensive) Patient / Family Education for ongoing care X- 1 10 Staff obtains Chiropractor, Records, T Results / Process Orders est []  - 0 Staff telephones HHA, Nursing Homes / Clarify orders / etc []  - 0 Routine Transfer to another Facility (non-emergent condition) []  - 0 Routine Hospital Admission (non-emergent condition) []  - 0 New Admissions / Manufacturing engineer / Ordering NPWT Apligraf, etc. , []  - 0 Emergency Hospital Admission (emergent condition) X- 1 10 Simple Discharge Coordination []  - 0 Complex (extensive) Discharge Coordination PROCESS - Special Needs []  - 0 Pediatric / Minor Patient Management []  - 0 Isolation Patient Management []  - 0 Hearing / Language / Visual special needs []  - 0 Assessment of Community assistance (transportation, D/C planning, etc.) []  - 0 Additional assistance / Altered mentation []  - 0 Support Surface(s) Assessment (bed,  cushion, seat, etc.) INTERVENTIONS - Wound Cleansing / Measurement X - Simple Wound Cleansing - one wound 1 5 []  - 0 Complex Wound Cleansing - multiple wounds X- 1 5 Wound Imaging (photographs - any  number of wounds) []  - 0 Wound Tracing (instead of photographs) X- 1 5 Simple Wound Measurement - one wound []  - 0 Complex Wound Measurement - multiple wounds INTERVENTIONS - Wound Dressings X - Small Wound Dressing one or multiple wounds 1 10 []  - 0 Medium Wound Dressing one or multiple wounds []  - 0 Large Wound Dressing one or multiple wounds X- 1 5 Application of Medications - topical []  - 0 Application of Medications - injection INTERVENTIONS - Miscellaneous []  - 0 External ear exam []  - 0 Specimen Collection (cultures, biopsies, blood, body fluids, etc.) ROMANITA, MACCRACKEN A ([id number]) [id number] Page 3 of 9 []  - 0 Specimen(s) / Culture(s) sent or taken to Lab for analysis []  - 0 Patient Transfer (multiple staff / Michiel Sites Lift / Similar devices) []  - 0 Simple Staple / Suture removal (25 or less) []  - 0 Complex Staple / Suture removal (26 or more) []  - 0 Hypo / Hyperglycemic Management (close monitor of Blood Glucose) []  - 0 Ankle / Brachial Index (ABI) - do not check if billed separately X- 1 5 Vital Signs Has the patient been seen at the hospital within the last three years: Yes Total Score: 90 Level Of Care: New/Established - Level 3 Electronic Signature(s) Signed: 09/28/2022 4:38:26 PM By: Angelina Pih Entered By: Angelina Pih on 09/28/2022 13:33:24 -------------------------------------------------------------------------------- Encounter Discharge Information Details Patient Name: Date of Service: Courtney Curd A. 09/28/2022 1:00 PM Medical Record Number: [id number] Patient Account Number: [id number] Date of Birth/Sex: Treating RN: 08/19/1940 (82 y.o. [name] Primary Care Cambren Helm: Bethann Punches Other  Clinician: Referring Jahmya Onofrio: Treating Jonus Coble/Extender: Margorie John in Treatment: 5 Encounter Discharge Information Items Discharge Condition: Stable Ambulatory Status: Cane Discharge Destination: Home Transportation: Private Auto Accompanied By: spouse Schedule Follow-up Appointment: Yes Clinical Summary of Care: Electronic Signature(s) Signed: 09/28/2022 4:38:26 PM By: Angelina Pih Entered By: Angelina Pih on 09/28/2022 13:34:28 -------------------------------------------------------------------------------- Lower Extremity Assessment Details Patient Name: Date of Service: TERELL, LUBRANO A. 09/28/2022 1:00 PM Medical Record Number: [id number] Patient Account Number: [id number] SELIN, HOPPMAN A ([id number]) [id number] Page 4 of 9 Date of Birth/Sex: Treating RN: August 15, 1940 (82 y.o. [name] Primary Care Sandralee Tarkington: Other Clinician: Bethann Punches Referring Avis Tirone: Treating Yumna Ebers/Extender: Margorie John in Treatment: 5 Electronic Signature(s) Signed: 09/28/2022 4:38:26 PM By: Angelina Pih Entered By: Angelina Pih on 09/28/2022 13:18:17 -------------------------------------------------------------------------------- Multi Wound Chart Details Patient Name: Date of Service: Courtney Curd A. 09/28/2022 1:00 PM Medical Record Number: [id number] Patient Account Number: [id number] Date of Birth/Sex: Treating RN: 01-27-1940 (82 y.o. [name] Primary Care Delaine Canter: Bethann Punches Other Clinician: Referring Larraine Argo: Treating Adler Alton/Extender: Margorie John in Treatment: 5 Vital Signs Height(in): 67 Pulse(bpm): 67 Weight(lbs): 180 Blood Pressure(mmHg): 133/54 Body Mass Index(BMI): 28.2 Temperature(F): 97.7 Respiratory Rate(breaths/min): 18 [2:Photos:] [N/A:N/A] Left Breast N/A N/A Wound Location: Surgical Injury N/A N/A Wounding Event: Open Surgical Wound N/A  N/A Primary Etiology: Sleep Apnea, Hypertension, Peripheral N/A N/A Comorbid History: Arterial Disease, Type II Diabetes, Rheumatoid Arthritis, Osteoarthritis, Neuropathy 06/19/2022 N/A N/A Date Acquired: 5 N/A N/A Weeks of Treatment: Open N/A N/A Wound Status: No N/A N/A Wound Recurrence: 0.4x1.4x1.6 N/A N/A Measurements L x W x D (cm) 0.44 N/A N/A A (cm) : rea 0.704 N/A N/A Volume (cm) : 77.60% N/A N/A % Reduction in Area: 91.00% N/A N/A % Reduction in Volume: Full Thickness With Exposed Support N/A N/A Classification: Structures Medium N/A N/A Exudate A mount: Serosanguineous N/A N/A Exudate Type:

## 2022-09-28 NOTE — Progress Notes (Addendum)
dressings under wraps. Use non-bordered dressing only. Electronic Signature(s) Signed: 09/28/2022 4:38:26 PM By: Angelina Pih Signed: 09/29/2022 9:00:01 AM By: Ashley Akin, [name] ([id number]) [id number] Page 4 of 7 Entered By: Angelina Pih on 09/28/2022  10:51:46 -------------------------------------------------------------------------------- Problem List Details Patient Name: Date of Service: Courtney [name], Courtney [name] 09/28/2022 1:00 PM Medical Record Number: [id number] Patient Account Number: [id number] Date of Birth/Sex: Treating RN: [birth year] (82 y.o. 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 08/22/2022 No Yes extremity I73.89 Other specified peripheral vascular diseases 08/22/2022 No Yes I10 Essential (primary) hypertension 08/22/2022 No Yes Inactive Problems Resolved Problems Electronic Signature(s) Signed: 09/28/2022 1:21:43 PM By: Allen Derry PA-C Entered By: Allen Derry on 09/28/2022 10:21:43 [name] ([id number]) [id number] Page 5 of 7 -------------------------------------------------------------------------------- Progress Note Details Patient Name: Date of Service: Courtney [name], Courtney [name] 09/28/2022 1:00 PM Medical Record Number: [id number] Patient Account Number: [id number] Date of Birth/Sex: Treating RN: 1940/06/15 (82 y.o. Courtney [name] Primary Care Provider: Bethann Punches Other Clinician: Referring Provider: Treating Provider/Extender: Margorie John in Treatment: 5 Subjective Chief Complaint Information obtained from Patient Left breast surgical ulcer History of Present Illness (HPI) 12-22-2021 upon  evaluation today patient appears to be doing well currently in regard to her wound all things considered especially in light of the picture that they showed me from August when she initially had [name] skin tear. Fortunately there does not appear to be any signs of active infection locally nor systemically at this time which is great news and overall I am extremely pleased with the fact that she is making some progress here she has been placed on Cipro due to [name] culture result that was done by rheumatologist which showed that she had Pseudomonas and Staphylococcus aureus noted as the organisms in question. Fortunately I do not think that there is anything that seems to be still present or significant here I think she is actually doing much better which is great news. With that being said the patient tells me that she has been using Medihoney at this point which actually is really good does help to clear up [name] lot of this I do believe. In the beginning she was also placed on or rather in an Unna boot wrap based on what we are being told that was for [name] couple of weeks and then she did not have any additional follow-up with primary eventually rheumatology who were doing injections for her realize that she had [name] wound they stopped the injections which obviously would lower her immune response and healing chances. That is when she started on the antibiotics and subsequently also was referred to vascular. The good news is from [name] vascular standpoint she seems to be probably sufficient to heal with [name] right ABI of 1.02 and [name] left ABI of 1.02 as well. Her TBI on the right was 0.63 and on the left 0.54. She does have [name] history of diabetes mellitus type 2 and she has an A1c of 6.5 on 10-17-2021. Other than this she also does  dressings under wraps. Use non-bordered dressing only. Electronic Signature(s) Signed: 09/28/2022 4:38:26 PM By: Angelina Pih Signed: 09/29/2022 9:00:01 AM By: Ashley Akin, [name] ([id number]) [id number] Page 4 of 7 Entered By: Angelina Pih on 09/28/2022  10:51:46 -------------------------------------------------------------------------------- Problem List Details Patient Name: Date of Service: Courtney [name], Courtney [name] 09/28/2022 1:00 PM Medical Record Number: [id number] Patient Account Number: [id number] Date of Birth/Sex: Treating RN: [birth year] (82 y.o. 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 08/22/2022 No Yes extremity I73.89 Other specified peripheral vascular diseases 08/22/2022 No Yes I10 Essential (primary) hypertension 08/22/2022 No Yes Inactive Problems Resolved Problems Electronic Signature(s) Signed: 09/28/2022 1:21:43 PM By: Allen Derry PA-C Entered By: Allen Derry on 09/28/2022 10:21:43 [name] ([id number]) [id number] Page 5 of 7 -------------------------------------------------------------------------------- Progress Note Details Patient Name: Date of Service: Courtney [name], Courtney [name] 09/28/2022 1:00 PM Medical Record Number: [id number] Patient Account Number: [id number] Date of Birth/Sex: Treating RN: 1940/06/15 (82 y.o. Courtney [name] Primary Care Provider: Bethann Punches Other Clinician: Referring Provider: Treating Provider/Extender: Margorie John in Treatment: 5 Subjective Chief Complaint Information obtained from Patient Left breast surgical ulcer History of Present Illness (HPI) 12-22-2021 upon  evaluation today patient appears to be doing well currently in regard to her wound all things considered especially in light of the picture that they showed me from August when she initially had [name] skin tear. Fortunately there does not appear to be any signs of active infection locally nor systemically at this time which is great news and overall I am extremely pleased with the fact that she is making some progress here she has been placed on Cipro due to [name] culture result that was done by rheumatologist which showed that she had Pseudomonas and Staphylococcus aureus noted as the organisms in question. Fortunately I do not think that there is anything that seems to be still present or significant here I think she is actually doing much better which is great news. With that being said the patient tells me that she has been using Medihoney at this point which actually is really good does help to clear up [name] lot of this I do believe. In the beginning she was also placed on or rather in an Unna boot wrap based on what we are being told that was for [name] couple of weeks and then she did not have any additional follow-up with primary eventually rheumatology who were doing injections for her realize that she had [name] wound they stopped the injections which obviously would lower her immune response and healing chances. That is when she started on the antibiotics and subsequently also was referred to vascular. The good news is from [name] vascular standpoint she seems to be probably sufficient to heal with [name] right ABI of 1.02 and [name] left ABI of 1.02 as well. Her TBI on the right was 0.63 and on the left 0.54. She does have [name] history of diabetes mellitus type 2 and she has an A1c of 6.5 on 10-17-2021. Other than this she also does  wound care visit that day. **Please direct any NON-WOUND related issues/requests for orders to patient's Primary Care Physician. **If current dressing causes regression in wound condition, may D/C ordered dressing product/s and apply Normal Saline Moist Dressing daily until next Wound Healing Center or Other MD appointment. **Notify Wound Healing Center of regression in wound condition at [phone]. Bathing/ Shower/ Hygiene: May shower; gently cleanse wound with antibacterial soap, rinse and pat dry prior to dressing wounds No tub bath. Anesthetic (Use 'Patient Medications' Section for Anesthetic Order Entry): Lidocaine applied to wound bed WOUND #2: - Breast Wound Laterality: Left Cleanser: Byram Ancillary Kit -  15 Day Supply (Generic) 1 x Per Day/15 Days Discharge Instructions: Use supplies as instructed; Kit contains: (15) Saline Bullets; (15) 3x3 Gauze; 15 pr Gloves Cleanser: Soap and Water 1 x Per Day/15 Days Discharge Instructions: Gently cleanse wound with antibacterial soap, rinse and pat dry prior to dressing wounds Prim Dressing: Gauze 1 x Per Day/15 Days ary Discharge Instructions: As directed: moistened with Dakins Solution Secondary Dressing: (BORDER) Zetuvit Plus SILICONE BORDER Dressing 5x5 (in/in) (Generic) 1 x Per Day/15 Days Discharge Instructions: Please do not put silicone bordered dressings under wraps. Use non-bordered dressing only. Courtney [name], Courtney [name] ([id number]) [id number] Page 7 of 7 1. I would recommend that she continue with the Dakin's moistened gauze packing she feels like this is doing much better for her and I am definitely okay with that would recommend that she continue to monitor for any signs of worsening overall. 2. I am good recommend as well that the patient should continue with the bordered foam dressing to cover which also seems to be doing quite well. We will see patient back for reevaluation in 1 week here in the clinic. If anything worsens or changes patient will contact our office for additional recommendations. Electronic Signature(s) Signed: 09/28/2022 1:47:53 PM By: Allen Derry PA-C Entered By: Allen Derry on 09/28/2022 10:47:53 -------------------------------------------------------------------------------- SuperBill Details Patient Name: Date of Service: Courtney Curd [name]. 09/28/2022 Medical Record Number: [id number] Patient Account Number: [id number] Date of Birth/Sex: Treating RN: [birth year] (82 y.o. 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 **Please direct any NON-WOUND related issues/requests for orders to patient's Primary Care Physician. **If current dressing causes regression in wound condition, may D/C ordered dressing product/s and apply Normal Saline Moist Dressing daily until next Wound Healing Center or Other MD appointment. **Notify Wound Healing Center of regression in wound condition at [phone]. Bathing/ Shower/ Hygiene: May shower; gently cleanse wound with antibacterial soap, rinse and pat dry prior to dressing wounds No tub bath. Anesthetic (Use 'Patient Medications' Section for Anesthetic Order Entry): Lidocaine applied to wound bed WOUND #2: - Breast Wound Laterality: Left Cleanser: Byram Ancillary Kit -  15 Day Supply (Generic) 1 x Per Day/15 Days Discharge Instructions: Use supplies as instructed; Kit contains: (15) Saline Bullets; (15) 3x3 Gauze; 15 pr Gloves Cleanser: Soap and Water 1 x Per Day/15 Days Discharge Instructions: Gently cleanse wound with antibacterial soap, rinse and pat dry prior to dressing wounds Prim Dressing: Gauze 1 x Per Day/15 Days ary Discharge Instructions: As directed: moistened with Dakins Solution Secondary Dressing: (BORDER) Zetuvit Plus SILICONE BORDER Dressing 5x5 (in/in) (Generic) 1 x Per Day/15 Days Discharge Instructions: Please do not put silicone bordered dressings under wraps. Use non-bordered dressing only. Courtney [name], Courtney [name] ([id number]) [id number] Page 7 of 7 1. I would recommend that she continue with the Dakin's moistened gauze packing she feels like this is doing much better for her and I am definitely okay with that would recommend that she continue to monitor for any signs of worsening overall. 2. I am good recommend as well that the patient should continue with the bordered foam dressing to cover which also seems to be doing quite well. We will see patient back for reevaluation in 1 week here in the clinic. If anything worsens or changes patient will contact our office for additional recommendations. Electronic Signature(s) Signed: 09/28/2022 1:47:53 PM By: Allen Derry PA-C Entered By: Allen Derry on 09/28/2022 10:47:53 -------------------------------------------------------------------------------- SuperBill Details Patient Name: Date of Service: Courtney Curd [name]. 09/28/2022 Medical Record Number: [id number] Patient Account Number: [id number] Date of Birth/Sex: Treating RN: [birth year] (82 y.o. 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 We have been using Dakin's wet to dry. The patient complains of mild stinging at the orifice of the wound from the wet Dakin's wicked gauze 09-14-2022 upon evaluation today patient appears to be doing well with regard to the wound on her left breast. This actually appears to be very clean and I do believe is making excellent headway towards being completely closed. Fortunately I do not see any signs of infection at this time. No fevers, chills, nausea, vomiting, or diarrhea. 09-28-2022 upon evaluation today patient appears to be doing decently well currently in  regard to her wound. She tells me the [facility] did not do so great for her it caused [name] lot of bleeding and it is somewhat uncomfortable. For that reason she switched back to the Dakin's moistened gauze packing which seems to be doing much better for her. Electronic Signature(s) Signed: 09/28/2022 1:47:19 PM By: Allen Derry PA-C Entered By: Allen Derry on 09/28/2022 10:47:19 -------------------------------------------------------------------------------- Physical Exam Details Patient Name: Date of Service: Courtney [name], Courtney [name] 09/28/2022 1:00 PM Medical Record Number: [id number] Patient Account Number: [id number] Date of Birth/Sex: Treating RN: Oct 05, 1940 (82 y.o. Courtney [name] Primary Care Provider: Bethann Punches Other Clinician: Referring Provider: Treating Provider/Extender: Margorie John in Treatment: 5 Constitutional Well-nourished and well-hydrated in no acute distress. Respiratory normal breathing without difficulty. Psychiatric this patient is able to make decisions and demonstrates good insight into disease process. Alert and Oriented x 3. pleasant and cooperative. EDELMIRA, LORENZ [name] ([id number]) [id number] Page 3 of 7 Notes Upon inspection patient's wound bed actually showed signs of good granulation epithelization at this point. Fortunately I do not see any evidence of worsening overall and I believe that the patient is making headway towards closure which is good news. Electronic Signature(s) Signed: 09/28/2022 1:47:32 PM By: Allen Derry PA-C Entered By: Allen Derry on 09/28/2022 10:47:32 -------------------------------------------------------------------------------- Physician Orders Details Patient Name: Date of Service: Courtney Curd [name]. 09/28/2022 1:00 PM Medical Record Number: [id number] Patient Account Number: [id number] Date of Birth/Sex: Treating RN: 17-Jun-1940 (82 y.o. 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 Bathing/ Shower/ Hygiene May shower; gently cleanse wound with antibacterial soap, rinse and pat dry prior to dressing wounds No tub bath. Anesthetic (Use 'Patient Medications' Section for Anesthetic Order Entry) Lidocaine applied to wound bed Wound Treatment Wound #2 - Breast Wound Laterality: Left Cleanser: Byram Ancillary Kit - 15 Day Supply (Generic) 1 x Per Day/15 Days Discharge Instructions: Use supplies as instructed; Kit contains: (15) Saline Bullets; (15) 3x3 Gauze; 15 pr Gloves Cleanser: Soap and Water 1 x Per Day/15 Days Discharge Instructions: Gently cleanse wound with antibacterial soap, rinse and pat dry prior to dressing wounds Prim Dressing: Gauze 1 x Per Day/15 Days ary Discharge Instructions: As directed: moistened with Dakins Solution Secondary Dressing: (BORDER) Zetuvit Plus SILICONE BORDER Dressing 5x5 (in/in) (Generic) 1 x Per Day/15 Days Discharge Instructions: Please do not put silicone bordered  [name] nonhealing wound area just superior to the nipple area. We have been using Dakin's wet to dry. The patient complains of mild stinging at the orifice of the wound from the wet Dakin's wicked gauze 09-14-2022 upon evaluation today patient appears to be doing well with regard to the wound on her left breast. This actually appears to be very clean and I do believe is making excellent headway towards being completely closed. Fortunately I do not see any signs of infection at this time. No fevers, chills, nausea, vomiting, or diarrhea. 09-28-2022 upon evaluation today patient appears to be doing decently well currently in  regard to her wound. She tells me the [facility] did not do so great for her it caused [name] lot of bleeding and it is somewhat uncomfortable. For that reason she switched back to the Dakin's moistened gauze packing which seems to be doing much better for her. Electronic Signature(s) Signed: 09/28/2022 1:47:19 PM By: Allen Derry PA-C Entered By: Allen Derry on 09/28/2022 10:47:19 -------------------------------------------------------------------------------- Physical Exam Details Patient Name: Date of Service: Courtney [name], Courtney [name] 09/28/2022 1:00 PM Medical Record Number: [id number] Patient Account Number: [id number] Date of Birth/Sex: Treating RN: Oct 05, 1940 (82 y.o. Courtney [name] Primary Care Provider: Bethann Punches Other Clinician: Referring Provider: Treating Provider/Extender: Margorie John in Treatment: 5 Constitutional Well-nourished and well-hydrated in no acute distress. Respiratory normal breathing without difficulty. Psychiatric this patient is able to make decisions and demonstrates good insight into disease process. Alert and Oriented x 3. pleasant and cooperative. EDELMIRA, LORENZ [name] ([id number]) [id number] Page 3 of 7 Notes Upon inspection patient's wound bed actually showed signs of good granulation epithelization at this point. Fortunately I do not see any evidence of worsening overall and I believe that the patient is making headway towards closure which is good news. Electronic Signature(s) Signed: 09/28/2022 1:47:32 PM By: Allen Derry PA-C Entered By: Allen Derry on 09/28/2022 10:47:32 -------------------------------------------------------------------------------- Physician Orders Details Patient Name: Date of Service: Courtney Curd [name]. 09/28/2022 1:00 PM Medical Record Number: [id number] Patient Account Number: [id number] Date of Birth/Sex: Treating RN: 17-Jun-1940 (82 y.o. 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 Bathing/ Shower/ Hygiene May shower; gently cleanse wound with antibacterial soap, rinse and pat dry prior to dressing wounds No tub bath. Anesthetic (Use 'Patient Medications' Section for Anesthetic Order Entry) Lidocaine applied to wound bed Wound Treatment Wound #2 - Breast Wound Laterality: Left Cleanser: Byram Ancillary Kit - 15 Day Supply (Generic) 1 x Per Day/15 Days Discharge Instructions: Use supplies as instructed; Kit contains: (15) Saline Bullets; (15) 3x3 Gauze; 15 pr Gloves Cleanser: Soap and Water 1 x Per Day/15 Days Discharge Instructions: Gently cleanse wound with antibacterial soap, rinse and pat dry prior to dressing wounds Prim Dressing: Gauze 1 x Per Day/15 Days ary Discharge Instructions: As directed: moistened with Dakins Solution Secondary Dressing: (BORDER) Zetuvit Plus SILICONE BORDER Dressing 5x5 (in/in) (Generic) 1 x Per Day/15 Days Discharge Instructions: Please do not put silicone bordered  [name] nonhealing wound area just superior to the nipple area. We have been using Dakin's wet to dry. The patient complains of mild stinging at the orifice of the wound from the wet Dakin's wicked gauze 09-14-2022 upon evaluation today patient appears to be doing well with regard to the wound on her left breast. This actually appears to be very clean and I do believe is making excellent headway towards being completely closed. Fortunately I do not see any signs of infection at this time. No fevers, chills, nausea, vomiting, or diarrhea. 09-28-2022 upon evaluation today patient appears to be doing decently well currently in  regard to her wound. She tells me the [facility] did not do so great for her it caused [name] lot of bleeding and it is somewhat uncomfortable. For that reason she switched back to the Dakin's moistened gauze packing which seems to be doing much better for her. Electronic Signature(s) Signed: 09/28/2022 1:47:19 PM By: Allen Derry PA-C Entered By: Allen Derry on 09/28/2022 10:47:19 -------------------------------------------------------------------------------- Physical Exam Details Patient Name: Date of Service: Courtney [name], Courtney [name] 09/28/2022 1:00 PM Medical Record Number: [id number] Patient Account Number: [id number] Date of Birth/Sex: Treating RN: Oct 05, 1940 (82 y.o. Courtney [name] Primary Care Provider: Bethann Punches Other Clinician: Referring Provider: Treating Provider/Extender: Margorie John in Treatment: 5 Constitutional Well-nourished and well-hydrated in no acute distress. Respiratory normal breathing without difficulty. Psychiatric this patient is able to make decisions and demonstrates good insight into disease process. Alert and Oriented x 3. pleasant and cooperative. EDELMIRA, LORENZ [name] ([id number]) [id number] Page 3 of 7 Notes Upon inspection patient's wound bed actually showed signs of good granulation epithelization at this point. Fortunately I do not see any evidence of worsening overall and I believe that the patient is making headway towards closure which is good news. Electronic Signature(s) Signed: 09/28/2022 1:47:32 PM By: Allen Derry PA-C Entered By: Allen Derry on 09/28/2022 10:47:32 -------------------------------------------------------------------------------- Physician Orders Details Patient Name: Date of Service: Courtney Curd [name]. 09/28/2022 1:00 PM Medical Record Number: [id number] Patient Account Number: [id number] Date of Birth/Sex: Treating RN: 17-Jun-1940 (82 y.o. 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 Bathing/ Shower/ Hygiene May shower; gently cleanse wound with antibacterial soap, rinse and pat dry prior to dressing wounds No tub bath. Anesthetic (Use 'Patient Medications' Section for Anesthetic Order Entry) Lidocaine applied to wound bed Wound Treatment Wound #2 - Breast Wound Laterality: Left Cleanser: Byram Ancillary Kit - 15 Day Supply (Generic) 1 x Per Day/15 Days Discharge Instructions: Use supplies as instructed; Kit contains: (15) Saline Bullets; (15) 3x3 Gauze; 15 pr Gloves Cleanser: Soap and Water 1 x Per Day/15 Days Discharge Instructions: Gently cleanse wound with antibacterial soap, rinse and pat dry prior to dressing wounds Prim Dressing: Gauze 1 x Per Day/15 Days ary Discharge Instructions: As directed: moistened with Dakins Solution Secondary Dressing: (BORDER) Zetuvit Plus SILICONE BORDER Dressing 5x5 (in/in) (Generic) 1 x Per Day/15 Days Discharge Instructions: Please do not put silicone bordered

## 2022-10-16 ENCOUNTER — Encounter: Payer: Medicare Other | Attending: Physician Assistant | Admitting: Physician Assistant

## 2022-10-16 DIAGNOSIS — E11622 Type 2 diabetes mellitus with other skin ulcer: Secondary | ICD-10-CM | POA: Diagnosis present

## 2022-10-16 DIAGNOSIS — I10 Essential (primary) hypertension: Secondary | ICD-10-CM | POA: Diagnosis not present

## 2022-10-16 DIAGNOSIS — L98492 Non-pressure chronic ulcer of skin of other sites with fat layer exposed: Secondary | ICD-10-CM | POA: Insufficient documentation

## 2022-10-16 DIAGNOSIS — I872 Venous insufficiency (chronic) (peripheral): Secondary | ICD-10-CM | POA: Insufficient documentation

## 2022-10-16 DIAGNOSIS — M069 Rheumatoid arthritis, unspecified: Secondary | ICD-10-CM | POA: Diagnosis not present

## 2022-10-16 DIAGNOSIS — E1151 Type 2 diabetes mellitus with diabetic peripheral angiopathy without gangrene: Secondary | ICD-10-CM | POA: Diagnosis not present

## 2022-10-16 DIAGNOSIS — E1140 Type 2 diabetes mellitus with diabetic neuropathy, unspecified: Secondary | ICD-10-CM | POA: Insufficient documentation

## 2022-10-16 NOTE — Progress Notes (Addendum)
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 Bathing/ Shower/ Hygiene: May shower; gently cleanse wound with antibacterial soap, rinse and pat dry prior to dressing wounds No tub bath. Anesthetic (Use 'Patient Medications' Section for Anesthetic Order Entry): Lidocaine applied to wound bed WOUND #2: - Breast Wound Laterality: Left Cleanser: Byram Ancillary Kit - 15 Day Supply (Generic) 1 x Per Day/15 Days Discharge Instructions: Use supplies as  instructed; Kit contains: (15) Saline Bullets; (15) 3x3 Gauze; 15 pr Gloves Cleanser: Soap and Water 1 x Per Day/15 Days Discharge Instructions: Gently cleanse wound with antibacterial soap, rinse and pat dry prior to dressing wounds Prim Dressing: Gauze 1 x Per Day/15 Days ary Discharge Instructions: As directed: moistened with Dakins Solution Secondary Dressing: (BORDER) Zetuvit Plus SILICONE BORDER Dressing 5x5 (in/in) (Generic) 1 x Per Day/15 Days Discharge Instructions: Please do not put silicone bordered dressings under wraps. Use non-bordered dressing only. 1. I am going to recommend that we have the patient continue with the Dakin's moistened gauze packing she is doing really well with this and in fact states that Courtney, CAUSEY [name] ([id number]) [id number] Page 7 of 7 this seems to be the best thing for her we will try to switch this may cause [name] lot more irritation. 2. I am good recommend as well that we have the patient continue with the bordered foam dressing to cover. 3. I am also can recommend the patient should continue to monitor for any signs of infection if anything changes she knows contact the office let me know. We will see patient back for reevaluation in 2 weeks here in the clinic. If anything worsens or changes patient will contact our office for additional recommendations. This is per patient's request. Electronic Signature(s) Signed: 10/16/2022 1:48:54 PM By: Allen Derry PA-C Entered By: Allen Derry on 10/16/2022 13:48:54 -------------------------------------------------------------------------------- SuperBill Details Patient Name: Date of Service: Courtney [name] 10/16/2022 Medical Record Number: [id number] Patient Account Number: [id number] Date of Birth/Sex: Treating RN: Mar 24, 1940 (82 y.o. 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 I73.89 Other specified peripheral vascular diseases I10 Essential (primary) hypertension Facility Procedures : CPT4 Code: [id number] Description: [phone] - WOUND CARE VISIT-LEV 2 EST PT Modifier: Quantity: 1 Physician Procedures : CPT4 Code Description Modifier [id number] - WC PHYS LEVEL 3 - EST PT ICD-10 Diagnosis Description T81.31XA Disruption of external operation (surgical) wound, not elsewhere classified, initial encounter L98.492 Non-pressure chronic ulcer of skin of  other sites with fat layer exposed E11.622 Type 2 diabetes mellitus with other skin ulcer I87.033 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 We have been using Dakin's wet to dry. The patient complains of mild stinging at the orifice of the wound from the wet Dakin's wicked gauze 09-14-2022 upon evaluation today patient appears to be doing well with regard to the wound on her left breast. This actually appears to be very clean and I do believe is making excellent headway towards being completely closed. Fortunately I do not see any signs of infection at this time. No fevers, chills, nausea, vomiting, or diarrhea. 09-28-2022 upon evaluation today patient appears to be doing decently well currently in  regard to her wound. She tells me the [facility] did not do so great for her it caused [name] lot of bleeding and it is somewhat uncomfortable. For that reason she switched back to the Dakin's moistened gauze packing which seems to be doing much better for her. 10-16-2022 upon evaluation today patient's wound actually showed signs of excellent improvement actually very pleased with where we stand I do believe that the patient is making good headway towards complete closure. Fortunately I do not see any evidence of worsening overall and I believe that the patient is making good headway at this time. Electronic Signature(s) Signed: 10/16/2022 1:47:39 PM By: Allen Derry PA-C Entered By: Allen Derry on 10/16/2022 13:47:38 -------------------------------------------------------------------------------- Physical Exam Details Patient Name: Date of Service: Courtney [name], Courtney [name] 10/16/2022 1:00 PM Medical Record Number: [id number] Patient Account Number: [id number] Date of Birth/Sex: Treating RN: 1940/12/25 (82 y.o. Courtney [name] Primary Care Provider: Bethann Punches Other Clinician: Referring Provider: Treating Provider/Extender: Margorie John in Treatment: 7 Constitutional Well-nourished and well-hydrated in no acute distress. Respiratory normal breathing without difficulty. SHALISHA, CLAUSING [name] ([id number]) [id number] Page 3 of 7 Psychiatric this patient is able to make decisions and demonstrates good insight into disease process. Alert and Oriented x 3. pleasant and cooperative. Notes Upon inspection patient's wound bed actually showed signs of good granulation epithelization at this point. Fortunately I see no signs of infection neuro I do believe that the patient is doing extremely well. Electronic Signature(s) Signed: 10/16/2022 1:47:54 PM By: Allen Derry PA-C Entered By: Allen Derry on 10/16/2022  13:47:54 -------------------------------------------------------------------------------- Physician Orders Details Patient Name: Date of Service: Dwana Curd [name]. 10/16/2022 1:00 PM Medical Record Number: [id number] Patient Account Number: [id number] Date of Birth/Sex: Treating RN: 04/26/1940 (82 y.o. 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 Bathing/ Shower/ Hygiene May shower; gently cleanse wound with antibacterial soap, rinse and pat dry prior to dressing wounds No tub bath. Anesthetic (Use 'Patient Medications' Section for Anesthetic Order Entry) Lidocaine applied to wound bed Wound Treatment Wound #2 - Breast Wound Laterality: Left Cleanser: Byram Ancillary Kit - 15 Day Supply (Generic) 1 x Per Day/15 Days Discharge Instructions: Use supplies as instructed; Kit contains: (15) Saline Bullets; (15) 3x3 Gauze; 15 pr Gloves Cleanser: Soap and Water 1 x Per Day/15 Days Discharge Instructions: Gently cleanse wound with antibacterial soap, rinse  and pat dry prior to dressing wounds Prim Dressing: Gauze 1 x Per Day/15 Days ary Discharge Instructions: As directed: moistened with Dakins Solution Secondary Dressing: (BORDER) Zetuvit Plus SILICONE BORDER Dressing 5x5 (in/in) (Generic) 1 x Per Day/15 Days Discharge  Instructions: Please do not put silicone bordered dressings under wraps. Use non-bordered dressing only. Electronic Signature(s) ANTONETTE, HENDRICKS [name] ([id number]) [id number] Page 4 of 7 Signed: 10/16/2022 1:46:05 PM By: Angelina Pih Signed: 10/16/2022 3:41:41 PM By: Allen Derry PA-C Entered By: Angelina Pih on 10/16/2022 13:46:04 -------------------------------------------------------------------------------- Problem List Details Patient Name: Date of Service: Dwana Curd [name]. 10/16/2022 1:00 PM Medical Record Number: [id number] Patient Account Number: [id number] Date of Birth/Sex: Treating RN: 1940/01/27 (82 y.o. Courtney [name] Primary Care Provider: Bethann Punches Other Clinician: Referring Provider: Treating Provider/Extender: Margorie John in Treatment: 7 Active Problems ICD-10 Encounter Code Description Active Date MDM Diagnosis T81.31XA Disruption of external operation (surgical) wound, not elsewhere classified, 08/22/2022 No Yes initial encounter L98.492 Non-pressure chronic ulcer of skin of other sites with fat layer exposed 08/22/2022 No Yes E11.622 Type 2 diabetes mellitus with other skin ulcer 08/22/2022 No Yes I87.033 Postthrombotic syndrome with ulcer and inflammation of bilateral lower 08/22/2022 No Yes extremity I73.89 Other specified peripheral vascular diseases 08/22/2022 No Yes I10 Essential (primary) hypertension 08/22/2022 No Yes Inactive Problems Resolved Problems Electronic Signature(s) Signed: 10/16/2022 1:26:57 PM By: Allen Derry PA-C Entered By: Allen Derry on 10/16/2022 13:26:57 Vanvorst, Danielle [name] ([id number]) [id number] Page 5 of 7 -------------------------------------------------------------------------------- Progress Note Details Patient Name: Date of Service: Courtney [name], Courtney [name] 10/16/2022 1:00 PM Medical Record Number: [id number] Patient Account Number: [id number] Date of Birth/Sex:  Treating RN: Jun 26, 1940 (82 y.o. Courtney [name] Primary Care Provider: Bethann Punches Other Clinician: Referring Provider: Treating Provider/Extender: Margorie John in Treatment: 7 Subjective Chief Complaint Information obtained from Patient Left breast surgical ulcer History of Present Illness (HPI) 12-22-2021 upon evaluation today patient appears to be doing well currently in regard to her wound all things considered especially in light of the picture that they showed me from August when she initially had [name] skin tear. Fortunately there does not appear to be any signs of active infection locally nor systemically at this time which is great news and overall I am extremely pleased with the fact that she is making some progress here she has been placed on Cipro due to [name] culture result that was done by rheumatologist which showed that she had Pseudomonas and Staphylococcus aureus noted as the organisms in question. Fortunately I do not think that there is anything that seems to be still present or significant here I think she is actually doing much better which is great news. With that being said the patient tells me that she has been using Medihoney at this point which actually is really good does help to clear up [name] lot of this I do believe. In the beginning she was also placed on or rather in an Unna boot wrap based on what we are being told that was for [name] couple of weeks and then she did not have any additional follow-up with primary eventually rheumatology who were doing injections for her realize that she had [name] wound they stopped the injections which obviously would lower her immune response and healing chances. That is when she started on the antibiotics and subsequently also was referred to vascular. 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 Bathing/ Shower/ Hygiene: May shower; gently cleanse wound with antibacterial soap, rinse and pat dry prior to dressing wounds No tub bath. Anesthetic (Use 'Patient Medications' Section for Anesthetic Order Entry): Lidocaine applied to wound bed WOUND #2: - Breast Wound Laterality: Left Cleanser: Byram Ancillary Kit - 15 Day Supply (Generic) 1 x Per Day/15 Days Discharge Instructions: Use supplies as  instructed; Kit contains: (15) Saline Bullets; (15) 3x3 Gauze; 15 pr Gloves Cleanser: Soap and Water 1 x Per Day/15 Days Discharge Instructions: Gently cleanse wound with antibacterial soap, rinse and pat dry prior to dressing wounds Prim Dressing: Gauze 1 x Per Day/15 Days ary Discharge Instructions: As directed: moistened with Dakins Solution Secondary Dressing: (BORDER) Zetuvit Plus SILICONE BORDER Dressing 5x5 (in/in) (Generic) 1 x Per Day/15 Days Discharge Instructions: Please do not put silicone bordered dressings under wraps. Use non-bordered dressing only. 1. I am going to recommend that we have the patient continue with the Dakin's moistened gauze packing she is doing really well with this and in fact states that Courtney, CAUSEY [name] ([id number]) [id number] Page 7 of 7 this seems to be the best thing for her we will try to switch this may cause [name] lot more irritation. 2. I am good recommend as well that we have the patient continue with the bordered foam dressing to cover. 3. I am also can recommend the patient should continue to monitor for any signs of infection if anything changes she knows contact the office let me know. We will see patient back for reevaluation in 2 weeks here in the clinic. If anything worsens or changes patient will contact our office for additional recommendations. This is per patient's request. Electronic Signature(s) Signed: 10/16/2022 1:48:54 PM By: Allen Derry PA-C Entered By: Allen Derry on 10/16/2022 13:48:54 -------------------------------------------------------------------------------- SuperBill Details Patient Name: Date of Service: Courtney [name] 10/16/2022 Medical Record Number: [id number] Patient Account Number: [id number] Date of Birth/Sex: Treating RN: Mar 24, 1940 (82 y.o. 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 I73.89 Other specified peripheral vascular diseases I10 Essential (primary) hypertension Facility Procedures : CPT4 Code: [id number] Description: [phone] - WOUND CARE VISIT-LEV 2 EST PT Modifier: Quantity: 1 Physician Procedures : CPT4 Code Description Modifier [id number] - WC PHYS LEVEL 3 - EST PT ICD-10 Diagnosis Description T81.31XA Disruption of external operation (surgical) wound, not elsewhere classified, initial encounter L98.492 Non-pressure chronic ulcer of skin of  other sites with fat layer exposed E11.622 Type 2 diabetes mellitus with other skin ulcer I87.033 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 We have been using Dakin's wet to dry. The patient complains of mild stinging at the orifice of the wound from the wet Dakin's wicked gauze 09-14-2022 upon evaluation today patient appears to be doing well with regard to the wound on her left breast. This actually appears to be very clean and I do believe is making excellent headway towards being completely closed. Fortunately I do not see any signs of infection at this time. No fevers, chills, nausea, vomiting, or diarrhea. 09-28-2022 upon evaluation today patient appears to be doing decently well currently in  regard to her wound. She tells me the [facility] did not do so great for her it caused [name] lot of bleeding and it is somewhat uncomfortable. For that reason she switched back to the Dakin's moistened gauze packing which seems to be doing much better for her. 10-16-2022 upon evaluation today patient's wound actually showed signs of excellent improvement actually very pleased with where we stand I do believe that the patient is making good headway towards complete closure. Fortunately I do not see any evidence of worsening overall and I believe that the patient is making good headway at this time. Electronic Signature(s) Signed: 10/16/2022 1:47:39 PM By: Allen Derry PA-C Entered By: Allen Derry on 10/16/2022 13:47:38 -------------------------------------------------------------------------------- Physical Exam Details Patient Name: Date of Service: Courtney [name], Courtney [name] 10/16/2022 1:00 PM Medical Record Number: [id number] Patient Account Number: [id number] Date of Birth/Sex: Treating RN: 1940/12/25 (82 y.o. Courtney [name] Primary Care Provider: Bethann Punches Other Clinician: Referring Provider: Treating Provider/Extender: Margorie John in Treatment: 7 Constitutional Well-nourished and well-hydrated in no acute distress. Respiratory normal breathing without difficulty. SHALISHA, CLAUSING [name] ([id number]) [id number] Page 3 of 7 Psychiatric this patient is able to make decisions and demonstrates good insight into disease process. Alert and Oriented x 3. pleasant and cooperative. Notes Upon inspection patient's wound bed actually showed signs of good granulation epithelization at this point. Fortunately I see no signs of infection neuro I do believe that the patient is doing extremely well. Electronic Signature(s) Signed: 10/16/2022 1:47:54 PM By: Allen Derry PA-C Entered By: Allen Derry on 10/16/2022  13:47:54 -------------------------------------------------------------------------------- Physician Orders Details Patient Name: Date of Service: Dwana Curd [name]. 10/16/2022 1:00 PM Medical Record Number: [id number] Patient Account Number: [id number] Date of Birth/Sex: Treating RN: 04/26/1940 (82 y.o. 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 Bathing/ Shower/ Hygiene May shower; gently cleanse wound with antibacterial soap, rinse and pat dry prior to dressing wounds No tub bath. 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 Bathing/ Shower/ Hygiene: May shower; gently cleanse wound with antibacterial soap, rinse and pat dry prior to dressing wounds No tub bath. Anesthetic (Use 'Patient Medications' Section for Anesthetic Order Entry): Lidocaine applied to wound bed WOUND #2: - Breast Wound Laterality: Left Cleanser: Byram Ancillary Kit - 15 Day Supply (Generic) 1 x Per Day/15 Days Discharge Instructions: Use supplies as  instructed; Kit contains: (15) Saline Bullets; (15) 3x3 Gauze; 15 pr Gloves Cleanser: Soap and Water 1 x Per Day/15 Days Discharge Instructions: Gently cleanse wound with antibacterial soap, rinse and pat dry prior to dressing wounds Prim Dressing: Gauze 1 x Per Day/15 Days ary Discharge Instructions: As directed: moistened with Dakins Solution Secondary Dressing: (BORDER) Zetuvit Plus SILICONE BORDER Dressing 5x5 (in/in) (Generic) 1 x Per Day/15 Days Discharge Instructions: Please do not put silicone bordered dressings under wraps. Use non-bordered dressing only. 1. I am going to recommend that we have the patient continue with the Dakin's moistened gauze packing she is doing really well with this and in fact states that Courtney, CAUSEY [name] ([id number]) [id number] Page 7 of 7 this seems to be the best thing for her we will try to switch this may cause [name] lot more irritation. 2. I am good recommend as well that we have the patient continue with the bordered foam dressing to cover. 3. I am also can recommend the patient should continue to monitor for any signs of infection if anything changes she knows contact the office let me know. We will see patient back for reevaluation in 2 weeks here in the clinic. If anything worsens or changes patient will contact our office for additional recommendations. This is per patient's request. Electronic Signature(s) Signed: 10/16/2022 1:48:54 PM By: Allen Derry PA-C Entered By: Allen Derry on 10/16/2022 13:48:54 -------------------------------------------------------------------------------- SuperBill Details Patient Name: Date of Service: Courtney [name] 10/16/2022 Medical Record Number: [id number] Patient Account Number: [id number] Date of Birth/Sex: Treating RN: Mar 24, 1940 (82 y.o. 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 I73.89 Other specified peripheral vascular diseases I10 Essential (primary) hypertension Facility Procedures : CPT4 Code: [id number] Description: [phone] - WOUND CARE VISIT-LEV 2 EST PT Modifier: Quantity: 1 Physician Procedures : CPT4 Code Description Modifier [id number] - WC PHYS LEVEL 3 - EST PT ICD-10 Diagnosis Description T81.31XA Disruption of external operation (surgical) wound, not elsewhere classified, initial encounter L98.492 Non-pressure chronic ulcer of skin of  other sites with fat layer exposed E11.622 Type 2 diabetes mellitus with other skin ulcer I87.033 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 Bathing/ Shower/ Hygiene: May shower; gently cleanse wound with antibacterial soap, rinse and pat dry prior to dressing wounds No tub bath. Anesthetic (Use 'Patient Medications' Section for Anesthetic Order Entry): Lidocaine applied to wound bed WOUND #2: - Breast Wound Laterality: Left Cleanser: Byram Ancillary Kit - 15 Day Supply (Generic) 1 x Per Day/15 Days Discharge Instructions: Use supplies as  instructed; Kit contains: (15) Saline Bullets; (15) 3x3 Gauze; 15 pr Gloves Cleanser: Soap and Water 1 x Per Day/15 Days Discharge Instructions: Gently cleanse wound with antibacterial soap, rinse and pat dry prior to dressing wounds Prim Dressing: Gauze 1 x Per Day/15 Days ary Discharge Instructions: As directed: moistened with Dakins Solution Secondary Dressing: (BORDER) Zetuvit Plus SILICONE BORDER Dressing 5x5 (in/in) (Generic) 1 x Per Day/15 Days Discharge Instructions: Please do not put silicone bordered dressings under wraps. Use non-bordered dressing only. 1. I am going to recommend that we have the patient continue with the Dakin's moistened gauze packing she is doing really well with this and in fact states that Courtney, CAUSEY [name] ([id number]) [id number] Page 7 of 7 this seems to be the best thing for her we will try to switch this may cause [name] lot more irritation. 2. I am good recommend as well that we have the patient continue with the bordered foam dressing to cover. 3. I am also can recommend the patient should continue to monitor for any signs of infection if anything changes she knows contact the office let me know. We will see patient back for reevaluation in 2 weeks here in the clinic. If anything worsens or changes patient will contact our office for additional recommendations. This is per patient's request. Electronic Signature(s) Signed: 10/16/2022 1:48:54 PM By: Allen Derry PA-C Entered By: Allen Derry on 10/16/2022 13:48:54 -------------------------------------------------------------------------------- SuperBill Details Patient Name: Date of Service: Courtney [name] 10/16/2022 Medical Record Number: [id number] Patient Account Number: [id number] Date of Birth/Sex: Treating RN: Mar 24, 1940 (82 y.o. 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 I73.89 Other specified peripheral vascular diseases I10 Essential (primary) hypertension Facility Procedures : CPT4 Code: [id number] Description: [phone] - WOUND CARE VISIT-LEV 2 EST PT Modifier: Quantity: 1 Physician Procedures : CPT4 Code Description Modifier [id number] - WC PHYS LEVEL 3 - EST PT ICD-10 Diagnosis Description T81.31XA Disruption of external operation (surgical) wound, not elsewhere classified, initial encounter L98.492 Non-pressure chronic ulcer of skin of  other sites with fat layer exposed E11.622 Type 2 diabetes mellitus with other skin ulcer I87.033 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

## 2022-10-16 NOTE — Progress Notes (Signed)
cm) 1.2 Depth: (cm) 1.2 Area: (cm) 0.094 Volume: (cm) 0.113 % Reduction in Area: 95.2% % Reduction in Volume: 98.6% Epithelialization: Small (1-33%) Tunneling: No Undermining: No Wound Description Classification: Full Thickness With Exposed Support Exudate Amount: Medium Exudate  Type: Serosanguineous Chismar, Brienna [name] ([id number]) Exudate Color: red, brown Structures Foul Odor After Cleansing: No Slough/Fibrino Yes [id number] Page 8 of 9 Wound Bed Granulation Amount: Large (67-100%) Exposed Structure Granulation Quality: Pink, Friable Fat Layer (Subcutaneous Tissue) Exposed: Yes Necrotic Amount: Small (1-33%) Necrotic Quality: Adherent Slough Treatment Notes Wound #2 (Breast) Wound Laterality: Left Cleanser Byram Ancillary Kit - 15 Day Supply Discharge Instruction: Use supplies as instructed; Kit contains: (15) Saline Bullets; (15) 3x3 Gauze; 15 pr Gloves Soap and Water Discharge Instruction: Gently cleanse wound with antibacterial soap, rinse and pat dry prior to dressing wounds Peri-Wound Care Topical Primary Dressing Gauze Discharge Instruction: As directed: moistened with Dakins Solution Secondary Dressing (BORDER) Zetuvit Plus SILICONE BORDER Dressing 5x5 (in/in) Discharge Instruction: Please do not put silicone bordered dressings under wraps. Use non-bordered dressing only. Secured With Compression Wrap Compression Stockings Add-Ons Electronic Signature(s) Signed: 10/16/2022 3:41:25 PM By: Angelina Pih Entered By: Angelina Pih on 10/16/2022 10:14:20 -------------------------------------------------------------------------------- Vitals Details Patient Name: Date of Service: Courtney [name]. 10/16/2022 1:00 PM Medical Record Number: [id number] Patient Account Number: [id number] Date of Birth/Sex: Treating RN: March 24, 1940 (82 y.o. Courtney [name] Primary Care Jessilynn Taft: Bethann Punches Other Clinician: Referring Lin Glazier: Treating Myrtha Tonkovich/Extender: Margorie John in Treatment: 7 Vital Signs Time Taken: 13:06 Temperature (F): 97.9 Height (in): 67 Pulse (bpm): 74 Weight (lbs): 180 Respiratory Rate (breaths/min): 18 Body Mass Index (BMI): 28.2 Blood Pressure (mmHg): 181/77 Reference Range: 80  - 120 mg / dl Electronic Signature(s) Signed: 10/16/2022 3:41:25 PM By: Hurshel PartyMervyn Skeeters ([id number]) PM By: Angelina Pih Denys 10/16/2022 3:41:25 [id number] Page 9 of 9 Entered By: Angelina Pih on 10/16/2022 10:06:27  number of wounds) []  - 0 Wound Tracing (instead of photographs) X- 1 5 Simple Wound Measurement - one wound []  - 0 Complex Wound Measurement - multiple wounds INTERVENTIONS - Wound Dressings X - Small Wound Dressing one or multiple wounds 1 10 []  - 0 Medium Wound Dressing one or multiple wounds []  - 0 Large Wound Dressing one or multiple wounds []  - 0 Application of Medications - topical []  - 0 Application of Medications - injection INTERVENTIONS - Miscellaneous []  - 0 External ear exam []  - 0 Specimen Collection (cultures, biopsies, blood, body fluids, etc.) Courtney [name], Courtney [name] ([id number]) [id number] Page 3 of 9 []  - 0 Specimen(s) / Culture(s) sent or taken to Lab for analysis []  - 0 Patient Transfer (multiple staff / Michiel Sites Lift / Similar devices) []  - 0 Simple Staple / Suture removal (25 or less) []  - 0 Complex Staple / Suture removal (26 or more) []  - 0 Hypo / Hyperglycemic Management (close monitor of Blood Glucose) []  - 0 Ankle / Brachial Index (ABI) - do not check if billed separately X- 1 5 Vital Signs Has the patient been seen at the hospital within the last three years: Yes Total Score: 75 Level Of Care: New/Established - Level 2 Electronic Signature(s) Signed: 10/16/2022 3:41:25 PM By: Angelina Pih Entered By: Angelina Pih on 10/16/2022 10:46:26 -------------------------------------------------------------------------------- Encounter Discharge Information Details Patient Name: Date of Service: Courtney [name]. 10/16/2022 1:00 PM Medical Record Number: [id number] Patient Account Number: [id number] Date of Birth/Sex: Treating RN: Sep 29, 1940 (82 y.o. Courtney [name] Primary Care Denika Krone: Bethann Punches Other  Clinician: Referring Charissa Knowles: Treating Wilberta Dorvil/Extender: Margorie John in Treatment: 7 Encounter Discharge Information Items Discharge Condition: Stable Ambulatory Status: Cane Discharge Destination: Home Transportation: Private Auto Accompanied By: spouse Schedule Follow-up Appointment: Yes Clinical Summary of Care: Electronic Signature(s) Signed: 10/16/2022 2:26:18 PM By: Angelina Pih Entered By: Angelina Pih on 10/16/2022 11:26:18 -------------------------------------------------------------------------------- Lower Extremity Assessment Details Patient Name: Date of Service: Courtney [name], Courtney [name] 10/16/2022 1:00 PM Medical Record Number: [id number] Patient Account Number: [id number] Courtney [name], Courtney [name] ([id number]) [phone].pdf Page 4 of 9 Date of Birth/Sex: Treating RN: 1940/03/06 (82 y.o. Courtney [name] Primary Care Cathan Gearin: Other Clinician: Bethann Punches Referring Dayven Linsley: Treating Koston Hennes/Extender: Margorie John in Treatment: 7 Electronic Signature(s) Signed: 10/16/2022 3:41:25 PM By: Angelina Pih Entered By: Angelina Pih on 10/16/2022 10:06:42 -------------------------------------------------------------------------------- Multi Wound Chart Details Patient Name: Date of Service: Courtney [name]. 10/16/2022 1:00 PM Medical Record Number: [id number] Patient Account Number: [id number] Date of Birth/Sex: Treating RN: 1940-06-24 (82 y.o. Courtney [name] Primary Care Tenya Araque: Bethann Punches Other Clinician: Referring Amorita Vanrossum: Treating Sharrod Achille/Extender: Margorie John in Treatment: 7 Vital Signs Height(in): 67 Pulse(bpm): 74 Weight(lbs): 180 Blood Pressure(mmHg): 181/77 Body Mass Index(BMI): 28.2 Temperature(F): 97.9 Respiratory Rate(breaths/min): 18 [2:Photos:] [N/[name]:N/[name]] Left Breast N/[name] N/[name] Wound Location: Surgical Injury N/[name] N/[name] Wounding Event: Open Surgical Wound N/[name]  N/[name] Primary Etiology: Sleep Apnea, Hypertension, Peripheral N/[name] N/[name] Comorbid History: Arterial Disease, Type II Diabetes, Rheumatoid Arthritis, Osteoarthritis, Neuropathy 06/19/2022 N/[name] N/[name] Date Acquired: 7 N/[name] N/[name] Weeks of Treatment: Open N/[name] N/[name] Wound Status: No N/[name] N/[name] Wound Recurrence: 0.1x1.2x1.2 N/[name] N/[name] Measurements L x W x D (cm) 0.094 N/[name] N/[name] [name] (cm) : rea 0.113 N/[name] N/[name] Volume (cm) : 95.20% N/[name] N/[name] % Reduction in Area: 98.60% N/[name] N/[name] % Reduction in Volume: Full Thickness With Exposed Support N/[name] N/[name] Classification: Structures Medium N/[name] N/[name] Exudate [name] mount: Serosanguineous N/[name] N/[name] Exudate Type:  red, brown N/[name] N/[name] Exudate Color: Large (67-100%) N/[name] N/[name] Granulation [name] mount: Pink, Friable N/[name] N/[name] Granulation Quality: Small (1-33%) N/[name] N/[name] Necrotic [name] mount: Fat Layer (Subcutaneous Tissue): Yes N/[name] N/[name] Exposed Structures: Small (1-33%) N/[name] N/[name] EpithelializationKELECHI, Courtney [name] ([id number]) [id number] Page 5 of 9 Treatment Notes Electronic Signature(s) Signed: 10/16/2022 1:45:34 PM By: Angelina Pih Entered By: Angelina Pih on 10/16/2022 10:45:34 -------------------------------------------------------------------------------- Multi-Disciplinary Care Plan Details Patient Name: Date of Service: Courtney [name]. 10/16/2022 1:00 PM Medical Record Number: [id number] Patient Account Number: [id number] Date of Birth/Sex: Treating RN: 30-Apr-1940 (82 y.o. Courtney [name] Primary Care Paola Flynt: Bethann Punches Other Clinician: Referring Ahlaya Ende: Treating Danikah Budzik/Extender: Margorie John in Treatment: 7 Active Inactive Wound/Skin Impairment Nursing Diagnoses: Impaired tissue integrity Knowledge deficit related to ulceration/compromised skin integrity Goals: Ulcer/skin breakdown will have [name] volume reduction of 30% by week 4 Date Initiated: 08/22/2022 Date Inactivated: 09/28/2022 Target Resolution Date:  09/19/2022 Goal Status: Met Ulcer/skin breakdown will have [name] volume reduction of 50% by week 8 Date Initiated: 08/22/2022 Target Resolution Date: 10/24/2022 Goal Status: Active Ulcer/skin breakdown will have [name] volume reduction of 80% by week 12 Date Initiated: 08/22/2022 Target Resolution Date: 11/14/2022 Goal Status: Active Ulcer/skin breakdown will heal within 14 weeks Date Initiated: 08/22/2022 Target Resolution Date: 11/28/2022 Goal Status: Active Interventions: Assess patient/caregiver ability to obtain necessary supplies Assess patient/caregiver ability to perform ulcer/skin care regimen upon admission and as needed Assess ulceration(s) every visit Provide education on ulcer and skin care Treatment Activities: Skin care regimen initiated : 08/22/2022 Notes: Electronic Signature(s) Signed: 10/16/2022 2:25:33 PM By: Angelina Pih Entered By: Angelina Pih on 10/16/2022 11:25:33 Courtney [name] ([id number]) [id number] Page 6 of 9 -------------------------------------------------------------------------------- Pain Assessment Details Patient Name: Date of Service: Courtney [name], Courtney [name] 10/16/2022 1:00 PM Medical Record Number: [id number] Patient Account Number: [id number] Date of Birth/Sex: Treating RN: 1940-09-06 (82 y.o. Courtney [name] Primary Care Wyonia Fontanella: Bethann Punches Other Clinician: Referring Sydney Azure: Treating Fred Franzen/Extender: Margorie John in Treatment: 7 Active Problems Location of Pain Severity and Description of Pain Patient Has Paino No Site Locations Rate the pain. Current Pain Level: 0 Pain Management and Medication Current Pain Management: Electronic Signature(s) Signed: 10/16/2022 3:41:25 PM By: Angelina Pih Entered By: Angelina Pih on 10/16/2022 10:06:33 -------------------------------------------------------------------------------- Patient/Caregiver Education Details Patient Name: Date of  Service: Courtney [name] 10/7/2024andnbsp1:00 PM Medical Record Number: [id number] Patient Account Number: [id number] Date of Birth/Gender: Treating RN: [birth year] (82 y.o. Courtney [name] Primary Care Physician: Bethann Punches Other Clinician: Referring Physician: Treating Physician/Extender: Margorie John in Treatment: 7 Courtney [name], Courtney [name] ([id number]) [id number] Page 7 of 9 Education Assessment Education Provided To: Patient Education Topics Provided Wound/Skin Impairment: Handouts: Caring for Your Ulcer Methods: Explain/Verbal Responses: State content correctly Electronic Signature(s) Signed: 10/16/2022 3:41:25 PM By: Angelina Pih Entered By: Angelina Pih on 10/16/2022 11:25:44 -------------------------------------------------------------------------------- Wound Assessment Details Patient Name: Date of Service: Courtney [name]. 10/16/2022 1:00 PM Medical Record Number: [id number] Patient Account Number: [id number] Date of Birth/Sex: Treating RN: October 31, 1940 (82 y.o. Courtney [name] Primary Care Rebekah Zackery: Bethann Punches Other Clinician: Referring Aziyah Provencal: Treating Equilla Que/Extender: Margorie John in Treatment: 7 Wound Status Wound Number: 2 Primary Open Surgical Wound Etiology: Wound Location: Left Breast Wound Open Wounding Event: Surgical Injury Status: Date Acquired: 06/19/2022 Comorbid Sleep Apnea, Hypertension, Peripheral Arterial Disease, Type II Weeks Of Treatment: 7 History: Diabetes, Rheumatoid Arthritis, Osteoarthritis, Neuropathy Clustered Wound: No Photos Wound Measurements Length: (cm) 0.1 Width: (  KEAJA, REAUME [name] ([id number]) [id number] Page 1 of 9 Visit Report for 10/16/2022 Arrival Information Details Patient Name: Date of Service: Courtney [name], Courtney [name] 10/16/2022 1:00 PM Medical Record Number: [id number] Patient Account Number: [id number] Date of Birth/Sex: Treating RN: 29-Jun-1940 (82 y.o. Courtney [name] Primary Care Suzanne Kho: Bethann Punches Other Clinician: Referring Shenee Wignall: Treating Cornie Mccomber/Extender: Margorie John in Treatment: 7 Visit Information History Since Last Visit Added or deleted any medications: No Patient Arrived: [place] Any new allergies or adverse reactions: No Arrival Time: 13:05 Had [name] fall or experienced change in No Accompanied By: spouse activities of daily living that may affect Transfer Assistance: None risk of falls: Patient Identification Verified: Yes Hospitalized since last visit: No Secondary Verification Process Completed: Yes Has Dressing in Place as Prescribed: Yes Patient Requires Transmission-Based No Pain Present Now: No Precautions: Patient Has Alerts: Yes Patient Alerts: Patient on Blood Thinner Left breast mass removed Electronic Signature(s) Signed: 10/16/2022 3:41:25 PM By: Angelina Pih Entered By: Angelina Pih on 10/16/2022 10:06:10 -------------------------------------------------------------------------------- Clinic Level of Care Assessment Details Patient Name: Date of Service: Courtney [name], Courtney [name] 10/16/2022 1:00 PM Medical Record Number: [id number] Patient Account Number: [id number] Date of Birth/Sex: Treating RN: 1940/12/18 (82 y.o. Courtney [name] Primary Care Cashton Hosley: Bethann Punches Other Clinician: Referring Tionna Gigante: Treating Shruti Arrey/Extender: Margorie John in Treatment: 7 Clinic Level of Care Assessment Items TOOL 4 Quantity Score []  - 0 Use when only an EandM is performed on FOLLOW-UP visit ASSESSMENTS - Nursing Assessment / Reassessment X- 1  10 Reassessment of Co-morbidities (includes updates in patient status) X- 1 5 Reassessment of Adherence to Treatment Plan ASSESSMENTS - Wound and Skin Assessment / Reassessment Courtney [name], Courtney [name] ([id number]) [id number] Page 2 of 9 X- 1 5 Simple Wound Assessment / Reassessment - one wound []  - 0 Complex Wound Assessment / Reassessment - multiple wounds []  - 0 Dermatologic / Skin Assessment (not related to wound area) ASSESSMENTS - Focused Assessment []  - 0 Circumferential Edema Measurements - multi extremities []  - 0 Nutritional Assessment / Counseling / Intervention []  - 0 Lower Extremity Assessment (monofilament, tuning fork, pulses) []  - 0 Peripheral Arterial Disease Assessment (using hand held doppler) ASSESSMENTS - Ostomy and/or Continence Assessment and Care []  - 0 Incontinence Assessment and Management []  - 0 Ostomy Care Assessment and Management (repouching, etc.) PROCESS - Coordination of Care X - Simple Patient / Family Education for ongoing care 1 15 []  - 0 Complex (extensive) Patient / Family Education for ongoing care []  - 0 Staff obtains Chiropractor, Records, T Results / Process Orders est []  - 0 Staff telephones HHA, Nursing Homes / Clarify orders / etc []  - 0 Routine Transfer to another Facility (non-emergent condition) []  - 0 Routine Hospital Admission (non-emergent condition) []  - 0 New Admissions / Manufacturing engineer / Ordering NPWT Apligraf, etc. , []  - 0 Emergency Hospital Admission (emergent condition) X- 1 10 Simple Discharge Coordination []  - 0 Complex (extensive) Discharge Coordination PROCESS - Special Needs []  - 0 Pediatric / Minor Patient Management []  - 0 Isolation Patient Management []  - 0 Hearing / Language / Visual special needs []  - 0 Assessment of Community assistance (transportation, D/C planning, etc.) []  - 0 Additional assistance / Altered mentation []  - 0 Support Surface(s) Assessment (bed,  cushion, seat, etc.) INTERVENTIONS - Wound Cleansing / Measurement X - Simple Wound Cleansing - one wound 1 5 []  - 0 Complex Wound Cleansing - multiple wounds X- 1 5 Wound Imaging (photographs - any

## 2022-10-24 ENCOUNTER — Other Ambulatory Visit: Payer: Self-pay | Admitting: Specialist

## 2022-10-24 DIAGNOSIS — R918 Other nonspecific abnormal finding of lung field: Secondary | ICD-10-CM

## 2022-10-30 ENCOUNTER — Encounter: Payer: Medicare Other | Admitting: Physician Assistant

## 2022-10-30 DIAGNOSIS — E11622 Type 2 diabetes mellitus with other skin ulcer: Secondary | ICD-10-CM | POA: Diagnosis not present

## 2022-10-30 NOTE — Progress Notes (Addendum)
hypertension Follow-up Appointments Return Appointment in 1 week. Bathing/ Shower/ Hygiene May shower; gently cleanse wound with antibacterial soap, rinse and pat dry prior to dressing wounds No tub bath. Anesthetic (Use 'Patient Medications' Section for Anesthetic Order Entry) Lidocaine applied to wound bed Wound Treatment Wound #2 - Breast Wound Laterality: Left Cleanser: Byram Ancillary Kit - 15 Day Supply (Generic) 3 x Per  Week/15 Days Discharge Instructions: Use supplies as instructed; Kit contains: (15) Saline Bullets; (15) 3x3 Gauze; 15 pr Gloves Cleanser: Soap and Water 3 x Per Week/15 Days Discharge Instructions: Gently cleanse wound with antibacterial soap, rinse and pat dry prior to dressing wounds Prim Dressing: Endoform Natural, Non-fenestrated, 2x2 (in/in) ary 3 x Per Week/15 Days Discharge Instructions: cut into strips Secondary Dressing: (BORDER) Zetuvit Plus SILICONE BORDER Dressing 5x5 (in/in) (Generic) 3 x Per Week/15 Days Courtney [name], Courtney [name] ([id number]) [id number] Page 4 of 7 Discharge Instructions: Please do not put silicone bordered dressings under wraps. Use non-bordered dressing only. Electronic Signature(s) Signed: 10/30/2022 1:55:04 PM By: Angelina Pih Signed: 10/30/2022 4:50:43 PM By: Allen Derry PA-C Entered By: Angelina Pih on 10/30/2022 10:55:04 -------------------------------------------------------------------------------- Problem List Details Patient Name: Date of Service: Courtney Curd [name]. 10/30/2022 1:00 PM Medical Record Number: [id number] Patient Account Number: [id number] Date of Birth/Sex: Treating RN: 31-Oct-1940 (82 y.o. 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 08/22/2022 No Yes extremity I73.89 Other specified peripheral vascular diseases 08/22/2022 No Yes I10 Essential  (primary) hypertension 08/22/2022 No Yes Inactive Problems Resolved Problems Electronic Signature(s) Signed: 10/30/2022 1:17:40 PM By: Allen Derry PA-C Entered By: Allen Derry on 10/30/2022 10:17:39 Courtney [name] ([id number]) [id number] Page 5 of 7 -------------------------------------------------------------------------------- Progress Note Details Patient Name: Date of Service: Courtney [name], Courtney [name] 10/30/2022 1:00 PM Medical Record Number: [id number] Patient Account Number: [id number] Date of Birth/Sex: Treating RN: May 01, 1940 (82 y.o. [name] Primary Care Provider: Bethann Punches Other Clinician: Referring Provider: Treating Provider/Extender: Margorie John in Treatment: 9 Subjective Chief Complaint Information obtained from Patient Left breast surgical ulcer History of Present Illness (HPI) 12-22-2021 upon evaluation today patient appears to be doing well currently in regard to her wound all things considered especially in light of the picture that they showed me from August when she initially had [name] skin tear. Fortunately there does not appear to be any signs of active infection locally nor systemically at this time which is great news and overall I am extremely pleased with the fact that she is making some progress here she has been placed on Cipro due to [name] culture result that was done by rheumatologist which showed that she had Pseudomonas and Staphylococcus aureus noted as the organisms in question. Fortunately I do not think that there is anything that seems to be still present or significant here I think she is actually doing much better which is great news. With that being said the patient tells me that she has been using Medihoney at this point which actually is really good does help to clear up [name] lot of this I do  Courtney [name], Courtney [name] ([id number]) [id number] Page 1 of 7 Visit Report for 10/30/2022 Chief Complaint Document Details Patient Name: Date of Service: Courtney [name], Courtney [name] 10/30/2022 1:00 PM Medical Record Number: [id number] Patient Account Number: [id number] Date of Birth/Sex: Treating RN: July 11, 1940 (82 y.o. [name] Primary Care Provider: Bethann Punches Other Clinician: Referring Provider: Treating Provider/Extender: Margorie John in Treatment: 9 Information Obtained from: Patient Chief Complaint Left breast surgical ulcer Electronic Signature(s) Signed: 10/30/2022 1:17:49 PM By: Allen Derry PA-C Entered By: Allen Derry on 10/30/2022 10:17:49 -------------------------------------------------------------------------------- HPI Details Patient Name: Date of Service: Courtney Curd [name]. 10/30/2022 1:00 PM Medical Record Number: [id number] Patient Account Number: [id number] Date of Birth/Sex: Treating RN: 01-10-1941 (82 y.o. [name] Primary Care Provider: Bethann Punches Other Clinician: Referring Provider: Treating Provider/Extender: Margorie John in Treatment: 9 History of Present Illness HPI Description: 12-22-2021 upon evaluation today patient appears to be doing well currently in regard to her wound all things considered especially in light of the picture that they showed me from August when she initially had [name] skin tear. Fortunately there does not appear to be any signs of active infection locally nor systemically at this time which is great news and overall I am extremely pleased with the fact that she is making some progress here she has been placed on Cipro due to [name] culture result that was done by rheumatologist which showed that she had Pseudomonas and Staphylococcus aureus noted as the organisms in question. Fortunately I do not think that there is anything that seems to be still present or significant here  I think she is actually doing much better which is great news. With that being said the patient tells me that she has been using Medihoney at this point which actually is really good does help to clear up [name] lot of this I do believe. In the beginning she was also placed on or rather in an Unna boot wrap based on what we are being told that was for [name] couple of weeks and then she did not have any additional follow-up with primary eventually rheumatology who were doing injections for her realize that she had [name] wound they stopped the injections which obviously would lower her immune response and healing chances. That is when she started on the antibiotics and subsequently also was referred to vascular. The good news is from [name] vascular standpoint she seems to be probably sufficient to heal with [name] right ABI of 1.02 and [name] left ABI of 1.02 as well. Her TBI on the right was 0.63 and on the left 0.54. She does have [name] history of diabetes mellitus type 2 and she has an A1c of 6.5 on 10-17-2021. Other than this she also does have chronic venous insufficiency her leg is swollen today I definitely think [name] compression wraps probably can help her she also has hypertension 12-29-2021 upon evaluation patient's wound is actually showing signs of excellent improvement. Fortunately I do not see any signs of infection I think she is making good progress here and overall I do believe that she is really looking quite nice as far as the overall appearance of the wound bed is concerned. There Courtney [name], Courtney [name] ([id number]) [id number] Page 2 of 7 is [name] little bit of necrotic tissue noted on the surface of the wound that is going require debridement today. 12/28; continued improvement in the area on the left lateral leg which was initially [name] skin tear in the  hypertension Follow-up Appointments Return Appointment in 1 week. Bathing/ Shower/ Hygiene May shower; gently cleanse wound with antibacterial soap, rinse and pat dry prior to dressing wounds No tub bath. Anesthetic (Use 'Patient Medications' Section for Anesthetic Order Entry) Lidocaine applied to wound bed Wound Treatment Wound #2 - Breast Wound Laterality: Left Cleanser: Byram Ancillary Kit - 15 Day Supply (Generic) 3 x Per  Week/15 Days Discharge Instructions: Use supplies as instructed; Kit contains: (15) Saline Bullets; (15) 3x3 Gauze; 15 pr Gloves Cleanser: Soap and Water 3 x Per Week/15 Days Discharge Instructions: Gently cleanse wound with antibacterial soap, rinse and pat dry prior to dressing wounds Prim Dressing: Endoform Natural, Non-fenestrated, 2x2 (in/in) ary 3 x Per Week/15 Days Discharge Instructions: cut into strips Secondary Dressing: (BORDER) Zetuvit Plus SILICONE BORDER Dressing 5x5 (in/in) (Generic) 3 x Per Week/15 Days Courtney [name], Courtney [name] ([id number]) [id number] Page 4 of 7 Discharge Instructions: Please do not put silicone bordered dressings under wraps. Use non-bordered dressing only. Electronic Signature(s) Signed: 10/30/2022 1:55:04 PM By: Angelina Pih Signed: 10/30/2022 4:50:43 PM By: Allen Derry PA-C Entered By: Angelina Pih on 10/30/2022 10:55:04 -------------------------------------------------------------------------------- Problem List Details Patient Name: Date of Service: Courtney Curd [name]. 10/30/2022 1:00 PM Medical Record Number: [id number] Patient Account Number: [id number] Date of Birth/Sex: Treating RN: 31-Oct-1940 (82 y.o. 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 08/22/2022 No Yes extremity I73.89 Other specified peripheral vascular diseases 08/22/2022 No Yes I10 Essential  (primary) hypertension 08/22/2022 No Yes Inactive Problems Resolved Problems Electronic Signature(s) Signed: 10/30/2022 1:17:40 PM By: Allen Derry PA-C Entered By: Allen Derry on 10/30/2022 10:17:39 Courtney [name] ([id number]) [id number] Page 5 of 7 -------------------------------------------------------------------------------- Progress Note Details Patient Name: Date of Service: Courtney [name], Courtney [name] 10/30/2022 1:00 PM Medical Record Number: [id number] Patient Account Number: [id number] Date of Birth/Sex: Treating RN: May 01, 1940 (82 y.o. [name] Primary Care Provider: Bethann Punches Other Clinician: Referring Provider: Treating Provider/Extender: Margorie John in Treatment: 9 Subjective Chief Complaint Information obtained from Patient Left breast surgical ulcer History of Present Illness (HPI) 12-22-2021 upon evaluation today patient appears to be doing well currently in regard to her wound all things considered especially in light of the picture that they showed me from August when she initially had [name] skin tear. Fortunately there does not appear to be any signs of active infection locally nor systemically at this time which is great news and overall I am extremely pleased with the fact that she is making some progress here she has been placed on Cipro due to [name] culture result that was done by rheumatologist which showed that she had Pseudomonas and Staphylococcus aureus noted as the organisms in question. Fortunately I do not think that there is anything that seems to be still present or significant here I think she is actually doing much better which is great news. With that being said the patient tells me that she has been using Medihoney at this point which actually is really good does help to clear up [name] lot of this I do  hypertension Follow-up Appointments Return Appointment in 1 week. Bathing/ Shower/ Hygiene May shower; gently cleanse wound with antibacterial soap, rinse and pat dry prior to dressing wounds No tub bath. Anesthetic (Use 'Patient Medications' Section for Anesthetic Order Entry) Lidocaine applied to wound bed Wound Treatment Wound #2 - Breast Wound Laterality: Left Cleanser: Byram Ancillary Kit - 15 Day Supply (Generic) 3 x Per  Week/15 Days Discharge Instructions: Use supplies as instructed; Kit contains: (15) Saline Bullets; (15) 3x3 Gauze; 15 pr Gloves Cleanser: Soap and Water 3 x Per Week/15 Days Discharge Instructions: Gently cleanse wound with antibacterial soap, rinse and pat dry prior to dressing wounds Prim Dressing: Endoform Natural, Non-fenestrated, 2x2 (in/in) ary 3 x Per Week/15 Days Discharge Instructions: cut into strips Secondary Dressing: (BORDER) Zetuvit Plus SILICONE BORDER Dressing 5x5 (in/in) (Generic) 3 x Per Week/15 Days Courtney [name], Courtney [name] ([id number]) [id number] Page 4 of 7 Discharge Instructions: Please do not put silicone bordered dressings under wraps. Use non-bordered dressing only. Electronic Signature(s) Signed: 10/30/2022 1:55:04 PM By: Angelina Pih Signed: 10/30/2022 4:50:43 PM By: Allen Derry PA-C Entered By: Angelina Pih on 10/30/2022 10:55:04 -------------------------------------------------------------------------------- Problem List Details Patient Name: Date of Service: Courtney Curd [name]. 10/30/2022 1:00 PM Medical Record Number: [id number] Patient Account Number: [id number] Date of Birth/Sex: Treating RN: 31-Oct-1940 (82 y.o. 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 08/22/2022 No Yes extremity I73.89 Other specified peripheral vascular diseases 08/22/2022 No Yes I10 Essential  (primary) hypertension 08/22/2022 No Yes Inactive Problems Resolved Problems Electronic Signature(s) Signed: 10/30/2022 1:17:40 PM By: Allen Derry PA-C Entered By: Allen Derry on 10/30/2022 10:17:39 Courtney [name] ([id number]) [id number] Page 5 of 7 -------------------------------------------------------------------------------- Progress Note Details Patient Name: Date of Service: Courtney [name], Courtney [name] 10/30/2022 1:00 PM Medical Record Number: [id number] Patient Account Number: [id number] Date of Birth/Sex: Treating RN: May 01, 1940 (82 y.o. [name] Primary Care Provider: Bethann Punches Other Clinician: Referring Provider: Treating Provider/Extender: Margorie John in Treatment: 9 Subjective Chief Complaint Information obtained from Patient Left breast surgical ulcer History of Present Illness (HPI) 12-22-2021 upon evaluation today patient appears to be doing well currently in regard to her wound all things considered especially in light of the picture that they showed me from August when she initially had [name] skin tear. Fortunately there does not appear to be any signs of active infection locally nor systemically at this time which is great news and overall I am extremely pleased with the fact that she is making some progress here she has been placed on Cipro due to [name] culture result that was done by rheumatologist which showed that she had Pseudomonas and Staphylococcus aureus noted as the organisms in question. Fortunately I do not think that there is anything that seems to be still present or significant here I think she is actually doing much better which is great news. With that being said the patient tells me that she has been using Medihoney at this point which actually is really good does help to clear up [name] lot of this I do  inspection patient's wound bed again showed excellent granulation of the base of the wound this is very small there is no undermining though it has not completely filled again I think that endoform may be [name] good option here. I discussed how this does not have to be completely retrieved she can pull out what she can leave what ever is remaining and then subsequently repack. Will see where things stand in [name] week. Integumentary (Hair, Skin) Wound #2 status is Open. Original cause of wound was Surgical Injury. The date acquired was: 06/19/2022. The wound has been in treatment 9 weeks. The wound is located on the Left Breast. The wound measures 0.1cm length x 0.3cm width x 1cm depth; 0.024cm^2 area and 0.024cm^3 volume. There is Fat Layer (Subcutaneous Tissue) exposed. There is no tunneling or undermining noted. There is [name] medium amount of serosanguineous drainage noted. There is large (67- 100%) pink, friable  granulation within the wound bed. There is no necrotic tissue within the wound bed. 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 Bathing/ Shower/ Hygiene: May shower; gently cleanse wound with antibacterial soap, rinse and pat dry prior to dressing wounds No tub bath. Anesthetic (Use 'Patient Medications' Section for Anesthetic Order Entry): Lidocaine applied to wound bed WOUND #2: - Breast Wound Laterality: Left Cleanser: Byram Ancillary Kit - 15 Day Supply (Generic) 3 x Per Week/15 Days Discharge Instructions: Use supplies as instructed; Kit contains: (15) Saline Bullets; (15) 3x3 Gauze; 15 pr Gloves Cleanser: Soap and Water 3 x Per Week/15 Days Discharge Instructions: Gently cleanse wound with antibacterial soap, rinse and pat dry prior to dressing wounds Prim Dressing: Endoform Natural, Non-fenestrated, 2x2 (in/in) 3 x Per Week/15 Days ary Discharge Instructions: cut into strips Secondary Dressing: (BORDER) Zetuvit Plus SILICONE BORDER Dressing 5x5 (in/in) (Generic) 3 x Per Week/15 Days Discharge Instructions: Please do not put silicone bordered dressings under wraps. Use non-bordered dressing only. Courtney [name], Courtney [name] ([id number]) [id number] Page 7 of 7 1. Would recommend that we have the patient continue to monitor for any signs of infection or worsening. Based on what I see I do believe that she is making excellent headway towards closure. 2. I am going to recommend as well that the patient should continue to monitor for any signs of  infection or worsening. If anything changes she knows to contact the office and let me know. 3. We will initiate treatment with the endoform and we will see how this does. We will see patient back for reevaluation in 1 week here in the clinic. If anything worsens or changes patient will contact our office for additional recommendations. Electronic Signature(s) Signed: 10/30/2022 2:03:20 PM By: Allen Derry PA-C Entered By: Allen Derry on 10/30/2022 11:03:20 -------------------------------------------------------------------------------- SuperBill Details Patient Name: Date of Service: Annamaria Boots 10/30/2022 Medical Record Number: [id number] Patient Account Number: [id number] Date of Birth/Sex: Treating RN: [birth year] (82 y.o. [name] Primary Care Provider: Bethann Punches Other Clinician: Referring Provider: Treating Provider/Extender: Margorie John in Treatment: 9 Diagnosis Coding ICD-10 Codes Code Description T81.31XA Disruption of external operation (surgical) wound, not elsewhere classified, initial encounter L98.492 Non-pressure chronic ulcer of skin of other sites with fat layer exposed  [name] nonhealing wound area just superior to the nipple area. We have been using Dakin's wet to dry. The patient complains of mild stinging at the orifice of the wound from the wet Dakin's wicked gauze 09-14-2022 upon evaluation today patient appears to be doing well with regard to the wound on her left breast. This actually appears to be very clean and I do believe is making excellent headway towards being completely closed. Fortunately I do not see any signs of infection at this time. No fevers, chills, nausea, vomiting, or diarrhea. 09-28-2022 upon evaluation today patient appears to be doing decently well currently in  regard to her wound. She tells me the [facility] did not do so great for her it caused [name] lot of bleeding and it is somewhat uncomfortable. For that reason she switched back to the Dakin's moistened gauze packing which seems to be doing much better for her. 10-16-2022 upon evaluation today patient's wound actually showed signs of excellent improvement actually very pleased with where we stand I do believe that the patient is making good headway towards complete closure. Fortunately I do not see any evidence of worsening overall and I believe that the patient is making good headway at this time. 10-30-2022 upon evaluation today patient appears to be doing well currently in regard to her wound which is actually showing signs of improvement in fact this is getting very small to the point she cannot even pack into this anymore with the Dakin's moistened gauze. Electronic Signature(s) Signed: 10/30/2022 2:02:28 PM By: Allen Derry PA-C Entered By: Allen Derry on 10/30/2022 11:02:28 -------------------------------------------------------------------------------- Physical Exam Details Patient Name: Date of Service: Courtney Tamala Ser [name]. 10/30/2022 1:00 PM Medical Record Number: [id number] Patient Account Number: [id number] Date of Birth/Sex: Treating RN: [birth year] (82 y.o. [name] Primary Care Provider: Bethann Punches Other Clinician: Referring Provider: Treating Provider/Extender: Margorie John in Treatment: 9 Constitutional Well-nourished and well-hydrated in no acute distress. Courtney [name], Courtney [name] ([id number]) [id number] Page 3 of 7 Respiratory normal breathing without difficulty. Psychiatric this patient is able to make decisions and demonstrates good insight into disease process. Alert and Oriented x 3. pleasant and cooperative. Notes Upon inspection patient's wound bed again showed excellent granulation of the base of the wound this is very  small there is no undermining though it has not completely filled again I think that endoform may be [name] good option here. I discussed how this does not have to be completely retrieved she can pull out what she can leave what ever is remaining and then subsequently repack. Will see where things stand in [name] week. Electronic Signature(s) Signed: 10/30/2022 2:02:53 PM By: Allen Derry PA-C Entered By: Allen Derry on 10/30/2022 11:02:53 -------------------------------------------------------------------------------- Physician Orders Details Patient Name: Date of Service: Courtney Curd [name]. 10/30/2022 1:00 PM Medical Record Number: [id number] Patient Account Number: [id number] Date of Birth/Sex: Treating RN: Jan 19, 1940 (82 y.o. 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 [name] Primary Care Provider: Bethann Punches Other Clinician: Referring Provider: Treating Provider/Extender: Margorie John in Treatment: 9 Information Obtained from: Patient Chief Complaint Left breast surgical ulcer Electronic Signature(s) Signed: 10/30/2022 1:17:49 PM By: Allen Derry PA-C Entered By: Allen Derry on 10/30/2022 10:17:49 -------------------------------------------------------------------------------- HPI Details Patient Name: Date of Service: Courtney Curd [name]. 10/30/2022 1:00 PM Medical Record Number: [id number] Patient Account Number: [id number] Date of Birth/Sex: Treating RN: 01-10-1941 (82 y.o. [name] Primary Care Provider: Bethann Punches Other Clinician: Referring Provider: Treating Provider/Extender: Margorie John in Treatment: 9 History of Present Illness HPI Description: 12-22-2021 upon evaluation today patient appears to be doing well currently in regard to her wound all things considered especially in light of the picture that they showed me from August when she initially had [name] skin tear. Fortunately there does not appear to be any signs of active infection locally nor systemically at this time which is great news and overall I am extremely pleased with the fact that she is making some progress here she has been placed on Cipro due to [name] culture result that was done by rheumatologist which showed that she had Pseudomonas and Staphylococcus aureus noted as the organisms in question. Fortunately I do not think that there is anything that seems to be still present or significant here  I think she is actually doing much better which is great news. With that being said the patient tells me that she has been using Medihoney at this point which actually is really good does help to clear up [name] lot of this I do believe. In the beginning she was also placed on or rather in an Unna boot wrap based on what we are being told that was for [name] couple of weeks and then she did not have any additional follow-up with primary eventually rheumatology who were doing injections for her realize that she had [name] wound they stopped the injections which obviously would lower her immune response and healing chances. That is when she started on the antibiotics and subsequently also was referred to vascular. The good news is from [name] vascular standpoint she seems to be probably sufficient to heal with [name] right ABI of 1.02 and [name] left ABI of 1.02 as well. Her TBI on the right was 0.63 and on the left 0.54. She does have [name] history of diabetes mellitus type 2 and she has an A1c of 6.5 on 10-17-2021. Other than this she also does have chronic venous insufficiency her leg is swollen today I definitely think [name] compression wraps probably can help her she also has hypertension 12-29-2021 upon evaluation patient's wound is actually showing signs of excellent improvement. Fortunately I do not see any signs of infection I think she is making good progress here and overall I do believe that she is really looking quite nice as far as the overall appearance of the wound bed is concerned. There Courtney [name], Courtney [name] ([id number]) [id number] Page 2 of 7 is [name] little bit of necrotic tissue noted on the surface of the wound that is going require debridement today. 12/28; continued improvement in the area on the left lateral leg which was initially [name] skin tear in the  inspection patient's wound bed again showed excellent granulation of the base of the wound this is very small there is no undermining though it has not completely filled again I think that endoform may be [name] good option here. I discussed how this does not have to be completely retrieved she can pull out what she can leave what ever is remaining and then subsequently repack. Will see where things stand in [name] week. Integumentary (Hair, Skin) Wound #2 status is Open. Original cause of wound was Surgical Injury. The date acquired was: 06/19/2022. The wound has been in treatment 9 weeks. The wound is located on the Left Breast. The wound measures 0.1cm length x 0.3cm width x 1cm depth; 0.024cm^2 area and 0.024cm^3 volume. There is Fat Layer (Subcutaneous Tissue) exposed. There is no tunneling or undermining noted. There is [name] medium amount of serosanguineous drainage noted. There is large (67- 100%) pink, friable  granulation within the wound bed. There is no necrotic tissue within the wound bed. 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 Bathing/ Shower/ Hygiene: May shower; gently cleanse wound with antibacterial soap, rinse and pat dry prior to dressing wounds No tub bath. Anesthetic (Use 'Patient Medications' Section for Anesthetic Order Entry): Lidocaine applied to wound bed WOUND #2: - Breast Wound Laterality: Left Cleanser: Byram Ancillary Kit - 15 Day Supply (Generic) 3 x Per Week/15 Days Discharge Instructions: Use supplies as instructed; Kit contains: (15) Saline Bullets; (15) 3x3 Gauze; 15 pr Gloves Cleanser: Soap and Water 3 x Per Week/15 Days Discharge Instructions: Gently cleanse wound with antibacterial soap, rinse and pat dry prior to dressing wounds Prim Dressing: Endoform Natural, Non-fenestrated, 2x2 (in/in) 3 x Per Week/15 Days ary Discharge Instructions: cut into strips Secondary Dressing: (BORDER) Zetuvit Plus SILICONE BORDER Dressing 5x5 (in/in) (Generic) 3 x Per Week/15 Days Discharge Instructions: Please do not put silicone bordered dressings under wraps. Use non-bordered dressing only. Courtney [name], Courtney [name] ([id number]) [id number] Page 7 of 7 1. Would recommend that we have the patient continue to monitor for any signs of infection or worsening. Based on what I see I do believe that she is making excellent headway towards closure. 2. I am going to recommend as well that the patient should continue to monitor for any signs of  infection or worsening. If anything changes she knows to contact the office and let me know. 3. We will initiate treatment with the endoform and we will see how this does. We will see patient back for reevaluation in 1 week here in the clinic. If anything worsens or changes patient will contact our office for additional recommendations. Electronic Signature(s) Signed: 10/30/2022 2:03:20 PM By: Allen Derry PA-C Entered By: Allen Derry on 10/30/2022 11:03:20 -------------------------------------------------------------------------------- SuperBill Details Patient Name: Date of Service: Annamaria Boots 10/30/2022 Medical Record Number: [id number] Patient Account Number: [id number] Date of Birth/Sex: Treating RN: [birth year] (82 y.o. [name] Primary Care Provider: Bethann Punches Other Clinician: Referring Provider: Treating Provider/Extender: Margorie John in Treatment: 9 Diagnosis Coding ICD-10 Codes Code Description T81.31XA Disruption of external operation (surgical) wound, not elsewhere classified, initial encounter L98.492 Non-pressure chronic ulcer of skin of other sites with fat layer exposed

## 2022-10-30 NOTE — Progress Notes (Signed)
Courtney [name], Courtney [name] ([id number]) [id number] Page 1 of 9 Visit Report for 10/30/2022 Arrival Information Details Patient Name: Date of Service: Courtney [name], Courtney [name] 10/30/2022 1:00 PM Medical Record Number: [id number] Patient Account Number: [id number] Date of Birth/Sex: Treating RN: [birth year] (82 y.o. Courtney [name] Primary Care Courtney [name]: Bethann Punches Other Clinician: Referring Courtney [name]: Treating Courtney [name]/Extender: Courtney [name] in Treatment: 9 Visit Information History Since Last Visit Added or deleted any medications: No Patient Arrived: Ambulatory Any new allergies or adverse reactions: No Arrival Time: 13:21 Had [name] fall or experienced change in No Accompanied By: self activities of daily living that may affect Transfer Assistance: None risk of falls: Patient Identification Verified: Yes Hospitalized since last visit: No Secondary Verification Process Completed: Yes Has Dressing in Place as Prescribed: Yes Patient Requires Transmission-Based No Pain Present Now: No Precautions: Patient Has Alerts: Yes Patient Alerts: Patient on Blood Thinner Left breast mass removed Electronic Signature(s) Signed: 10/30/2022 1:21:58 PM By: Angelina Pih Entered By: Angelina Pih on 10/30/2022 10:21:58 -------------------------------------------------------------------------------- Clinic Level of Care Assessment Details Patient Name: Date of Service: Courtney [name], Courtney [name] 10/30/2022 1:00 PM Medical Record Number: [id number] Patient Account Number: [id number] Date of Birth/Sex: Treating RN: 08/10/1940 (82 y.o. Courtney [name] Primary Care Courtney [name]: Bethann Punches Other Clinician: Referring Courtney [name]: Treating Courtney [name]/Extender: Courtney [name] in Treatment: 9 Clinic Level of Care Assessment Items TOOL 4 Quantity Score []  - 0 Use when only an EandM is performed on FOLLOW-UP visit ASSESSMENTS - Nursing Assessment /  Reassessment X- 1 10 Reassessment of Co-morbidities (includes updates in patient status) X- 1 5 Reassessment of Adherence to Treatment Plan ASSESSMENTS - Wound and Skin Assessment / Reassessment Courtney [name], Courtney [name] ([id number]) [id number] Page 2 of 9 X- 1 5 Simple Wound Assessment / Reassessment - one wound []  - 0 Complex Wound Assessment / Reassessment - multiple wounds []  - 0 Dermatologic / Skin Assessment (not related to wound area) ASSESSMENTS - Focused Assessment []  - 0 Circumferential Edema Measurements - multi extremities []  - 0 Nutritional Assessment / Counseling / Intervention []  - 0 Lower Extremity Assessment (monofilament, tuning fork, pulses) []  - 0 Peripheral Arterial Disease Assessment (using hand held doppler) ASSESSMENTS - Ostomy and/or Continence Assessment and Care []  - 0 Incontinence Assessment and Management []  - 0 Ostomy Care Assessment and Management (repouching, etc.) PROCESS - Coordination of Care X - Simple Patient / Family Education for ongoing care 1 15 []  - 0 Complex (extensive) Patient / Family Education for ongoing care X- 1 10 Staff obtains Chiropractor, Records, T Results / Process Orders est []  - 0 Staff telephones HHA, Nursing Homes / Clarify orders / etc []  - 0 Routine Transfer to another Facility (non-emergent condition) []  - 0 Routine Hospital Admission (non-emergent condition) []  - 0 New Admissions / Manufacturing engineer / Ordering NPWT Apligraf, etc. , []  - 0 Emergency Hospital Admission (emergent condition) X- 1 10 Simple Discharge Coordination []  - 0 Complex (extensive) Discharge Coordination PROCESS - Special Needs []  - 0 Pediatric / Minor Patient Management []  - 0 Isolation Patient Management []  - 0 Hearing / Language / Visual special needs []  - 0 Assessment of Community assistance (transportation, D/C planning, etc.) []  - 0 Additional assistance / Altered mentation []  - 0 Support Surface(s)  Assessment (bed, cushion, seat, etc.) INTERVENTIONS - Wound Cleansing / Measurement X - Simple Wound Cleansing - one wound 1 5 []  - 0 Complex Wound Cleansing - multiple wounds X- 1 5 Wound Imaging (photographs - any  Length: (cm) 0.1 Width: (cm) 0.3 Depth: (cm) 1 Area: (cm) 0.024 Volume: (cm) 0.024 % Reduction in Area: 98.8% % Reduction in Volume: 99.7% Epithelialization: Small (1-33%) Tunneling: No Undermining: No Wound Description Classification: Full Thickness With Exposed Support  Structures Exudate Amount: Medium Exudate Type: Serosanguineous [name], [name] ([id number]) Exudate Color: red, brown Foul Odor After Cleansing: No Slough/Fibrino Yes [id number] Page 8 of 9 Wound Bed Granulation Amount: Large (67-100%) Exposed Structure Granulation Quality: Pink, Friable Fat Layer (Subcutaneous Tissue) Exposed: Yes Necrotic Amount: None Present (0%) Treatment Notes Wound #2 (Breast) Wound Laterality: Left Cleanser Byram Ancillary Kit - 15 Day Supply Discharge Instruction: Use supplies as instructed; Kit contains: (15) Saline Bullets; (15) 3x3 Gauze; 15 pr Gloves Soap and Water Discharge Instruction: Gently cleanse wound with antibacterial soap, rinse and pat dry prior to dressing wounds Peri-Wound Care Topical Primary Dressing Endoform Natural, Non-fenestrated, 2x2 (in/in) Discharge Instruction: cut into strips Secondary Dressing (BORDER) Zetuvit Plus SILICONE BORDER Dressing 5x5 (in/in) Discharge Instruction: Please do not put silicone bordered dressings under wraps. Use non-bordered dressing only. Secured With Compression Wrap Compression Stockings Facilities manager) Signed: 10/30/2022 5:09:45 PM By: Angelina Pih Entered By: Angelina Pih on 10/30/2022 10:14:40 -------------------------------------------------------------------------------- Vitals Details Patient Name: Date of Service: Courtney Curd [name]. 10/30/2022 1:00 PM Medical Record Number: [id number] Patient Account Number: [id number] Date of Birth/Sex: Treating RN: Aug 07, 1940 (82 y.o. Courtney [name] Primary Care Jeanet Lupe: Bethann Punches Other Clinician: Referring Courtney [name] Wack: Treating Renise Gillies/Extender: Courtney [name] in Treatment: 9 Vital Signs Time Taken: 13:10 Temperature (F): 97.8 Height (in): 67 Pulse (bpm): 80 Weight (lbs): 180 Respiratory Rate (breaths/min): 18 Body Mass Index (BMI): 28.2 Blood Pressure (mmHg):  172/86 Reference Range: 80 - 120 mg / dl Electronic Signature(s) Signed: 10/30/2022 1:22:16 PM By: Hessie Diener, Khalil [name] ([id number]) [id number] Page 9 of 9 Entered By: Angelina Pih on 10/30/2022 10:22:16  Courtney [name], Courtney [name] ([id number]) [id number] Page 1 of 9 Visit Report for 10/30/2022 Arrival Information Details Patient Name: Date of Service: Courtney [name], Courtney [name] 10/30/2022 1:00 PM Medical Record Number: [id number] Patient Account Number: [id number] Date of Birth/Sex: Treating RN: [birth year] (82 y.o. Courtney [name] Primary Care Courtney [name]: Bethann Punches Other Clinician: Referring Courtney [name]: Treating Courtney [name]/Extender: Courtney [name] in Treatment: 9 Visit Information History Since Last Visit Added or deleted any medications: No Patient Arrived: Ambulatory Any new allergies or adverse reactions: No Arrival Time: 13:21 Had [name] fall or experienced change in No Accompanied By: self activities of daily living that may affect Transfer Assistance: None risk of falls: Patient Identification Verified: Yes Hospitalized since last visit: No Secondary Verification Process Completed: Yes Has Dressing in Place as Prescribed: Yes Patient Requires Transmission-Based No Pain Present Now: No Precautions: Patient Has Alerts: Yes Patient Alerts: Patient on Blood Thinner Left breast mass removed Electronic Signature(s) Signed: 10/30/2022 1:21:58 PM By: Angelina Pih Entered By: Angelina Pih on 10/30/2022 10:21:58 -------------------------------------------------------------------------------- Clinic Level of Care Assessment Details Patient Name: Date of Service: Courtney [name], Courtney [name] 10/30/2022 1:00 PM Medical Record Number: [id number] Patient Account Number: [id number] Date of Birth/Sex: Treating RN: 08/10/1940 (82 y.o. Courtney [name] Primary Care Courtney [name]: Bethann Punches Other Clinician: Referring Courtney [name]: Treating Courtney [name]/Extender: Courtney [name] in Treatment: 9 Clinic Level of Care Assessment Items TOOL 4 Quantity Score []  - 0 Use when only an EandM is performed on FOLLOW-UP visit ASSESSMENTS - Nursing Assessment /  Reassessment X- 1 10 Reassessment of Co-morbidities (includes updates in patient status) X- 1 5 Reassessment of Adherence to Treatment Plan ASSESSMENTS - Wound and Skin Assessment / Reassessment Courtney [name], Courtney [name] ([id number]) [id number] Page 2 of 9 X- 1 5 Simple Wound Assessment / Reassessment - one wound []  - 0 Complex Wound Assessment / Reassessment - multiple wounds []  - 0 Dermatologic / Skin Assessment (not related to wound area) ASSESSMENTS - Focused Assessment []  - 0 Circumferential Edema Measurements - multi extremities []  - 0 Nutritional Assessment / Counseling / Intervention []  - 0 Lower Extremity Assessment (monofilament, tuning fork, pulses) []  - 0 Peripheral Arterial Disease Assessment (using hand held doppler) ASSESSMENTS - Ostomy and/or Continence Assessment and Care []  - 0 Incontinence Assessment and Management []  - 0 Ostomy Care Assessment and Management (repouching, etc.) PROCESS - Coordination of Care X - Simple Patient / Family Education for ongoing care 1 15 []  - 0 Complex (extensive) Patient / Family Education for ongoing care X- 1 10 Staff obtains Chiropractor, Records, T Results / Process Orders est []  - 0 Staff telephones HHA, Nursing Homes / Clarify orders / etc []  - 0 Routine Transfer to another Facility (non-emergent condition) []  - 0 Routine Hospital Admission (non-emergent condition) []  - 0 New Admissions / Manufacturing engineer / Ordering NPWT Apligraf, etc. , []  - 0 Emergency Hospital Admission (emergent condition) X- 1 10 Simple Discharge Coordination []  - 0 Complex (extensive) Discharge Coordination PROCESS - Special Needs []  - 0 Pediatric / Minor Patient Management []  - 0 Isolation Patient Management []  - 0 Hearing / Language / Visual special needs []  - 0 Assessment of Community assistance (transportation, D/C planning, etc.) []  - 0 Additional assistance / Altered mentation []  - 0 Support Surface(s)  Assessment (bed, cushion, seat, etc.) INTERVENTIONS - Wound Cleansing / Measurement X - Simple Wound Cleansing - one wound 1 5 []  - 0 Complex Wound Cleansing - multiple wounds X- 1 5 Wound Imaging (photographs - any  red, brown N/[name] N/[name] Exudate Color: Large (67-100%) N/[name] N/[name] Granulation [name] mount: Pink, Friable N/[name] N/[name] Granulation Quality: None Present (0%) N/[name] N/[name] Necrotic [name] mount: Fat Layer (Subcutaneous Tissue): Yes N/[name] N/[name] Exposed Structures: Small (1-33%) N/[name] N/[name] EpithelializationKATHARINE, Courtney [name] ([id number]) [id number] Page 5 of 9 Treatment Notes Electronic Signature(s) Signed: 10/30/2022 1:53:43 PM By: Angelina Pih Entered By: Angelina Pih on 10/30/2022 10:53:43 -------------------------------------------------------------------------------- Multi-Disciplinary Care Plan Details Patient Name: Date of Service: Courtney Curd [name]. 10/30/2022 1:00 PM Medical Record Number: [id number] Patient Account Number: [id number] Date of Birth/Sex: Treating RN: [birth year] (82 y.o. Courtney [name] Primary Care Demisha Nokes: Bethann Punches Other Clinician: Referring Gearline Spilman: Treating Antinio Sanderfer/Extender: Courtney [name] in Treatment: 9 Active Inactive Wound/Skin Impairment Nursing Diagnoses: Impaired tissue integrity Knowledge deficit related to ulceration/compromised skin integrity Goals: Ulcer/skin breakdown will have [name] volume reduction of 30% by week 4 Date Initiated: 08/22/2022 Date Inactivated: 09/28/2022 Target Resolution Date:  09/19/2022 Goal Status: Met Ulcer/skin breakdown will have [name] volume reduction of 50% by week 8 Date Initiated: 08/22/2022 Date Inactivated: 10/30/2022 Target Resolution Date: 10/24/2022 Goal Status: Met Ulcer/skin breakdown will have [name] volume reduction of 80% by week 12 Date Initiated: 08/22/2022 Target Resolution Date: 11/14/2022 Goal Status: Active Ulcer/skin breakdown will heal within 14 weeks Date Initiated: 08/22/2022 Target Resolution Date: 11/28/2022 Goal Status: Active Interventions: Assess patient/caregiver ability to obtain necessary supplies Assess patient/caregiver ability to perform ulcer/skin care regimen upon admission and as needed Assess ulceration(s) every visit Provide education on ulcer and skin care Treatment Activities: Skin care regimen initiated : 08/22/2022 Notes: Electronic Signature(s) Signed: 10/30/2022 1:56:58 PM By: Angelina Pih Entered By: Angelina Pih on 10/30/2022 10:56:58 Courtney [name] ([id number]) [id number] Page 6 of 9 -------------------------------------------------------------------------------- Pain Assessment Details Patient Name: Date of Service: Courtney [name], Courtney [name] 10/30/2022 1:00 PM Medical Record Number: [id number] Patient Account Number: [id number] Date of Birth/Sex: Treating RN: [birth year] (82 y.o. Courtney [name] Primary Care Ridge Lafond: Bethann Punches Other Clinician: Referring Ashanti Littles: Treating Jemuel Laursen/Extender: Courtney [name] in Treatment: 9 Active Problems Location of Pain Severity and Description of Pain Patient Has Paino No Site Locations Rate the pain. Current Pain Level: 0 Pain Management and Medication Current Pain Management: Electronic Signature(s) Signed: 10/30/2022 1:22:27 PM By: Angelina Pih Entered By: Angelina Pih on 10/30/2022 10:22:26 -------------------------------------------------------------------------------- Patient/Caregiver Education  Details Patient Name: Date of Service: Courtney [name] 10/21/2024andnbsp1:00 PM Medical Record Number: [id number] Patient Account Number: [id number] Date of Birth/Gender: Treating RN: [birth year] (82 y.o. Courtney [name] Primary Care Physician: Bethann Punches Other Clinician: Referring Physician: Treating Physician/Extender: Courtney [name] in Treatment: [address] Shantea [name] ([id number]) [id number] Page 7 of 9 Education Assessment Education Provided To: Patient Education Topics Provided Wound/Skin Impairment: Handouts: Caring for Your Ulcer Methods: Explain/Verbal Responses: State content correctly Electronic Signature(s) Signed: 10/30/2022 5:09:45 PM By: Angelina Pih Entered By: Angelina Pih on 10/30/2022 10:57:33 -------------------------------------------------------------------------------- Wound Assessment Details Patient Name: Date of Service: Courtney [name], Courtney [name] 10/30/2022 1:00 PM Medical Record Number: [id number] Patient Account Number: [id number] Date of Birth/Sex: Treating RN: 05-22-1940 (82 y.o. Courtney [name] Primary Care Katrell Milhorn: Bethann Punches Other Clinician: Referring Warnell Rasnic: Treating Jason Hauge/Extender: Courtney [name] in Treatment: 9 Wound Status Wound Number: 2 Primary Open Surgical Wound Etiology: Wound Location: Left Breast Wound Open Wounding Event: Surgical Injury Status: Date Acquired: 06/19/2022 Comorbid Sleep Apnea, Hypertension, Peripheral Arterial Disease, Type II Weeks Of Treatment: 9 History: Diabetes, Rheumatoid Arthritis, Osteoarthritis, Neuropathy Clustered Wound: No Photos Wound Measurements

## 2022-10-31 ENCOUNTER — Ambulatory Visit
Admission: RE | Admit: 2022-10-31 | Discharge: 2022-10-31 | Disposition: A | Discharge status: Home / Self Care | Payer: Medicare Other | Source: Ambulatory Visit | Attending: Specialist | Admitting: Specialist

## 2022-10-31 DIAGNOSIS — R918 Other nonspecific abnormal finding of lung field: Secondary | ICD-10-CM | POA: Diagnosis present

## 2022-11-06 ENCOUNTER — Encounter: Payer: Medicare Other | Admitting: Physician Assistant

## 2022-11-06 DIAGNOSIS — E11622 Type 2 diabetes mellitus with other skin ulcer: Secondary | ICD-10-CM | POA: Diagnosis not present

## 2022-11-06 NOTE — Progress Notes (Addendum)
Courtney [name], Courtney [name] ([id number]) [id number] Page 1 of 8 Visit Report for 11/06/2022 Arrival Information Details Patient Name: Date of Service: Courtney [name] 11/06/2022 1:00 PM Medical Record Number: [id number] Patient Account Number: [id number] Date of Birth/Sex: Treating RN: 1940-10-29 (82 y.o. Courtney [name] Primary Care Courtney [name]: Courtney [name] Other Clinician: Referring Courtney [name]: Treating Courtney [name]/Extender: Courtney [name] in Treatment: 10 Visit Information History Since Last Visit Added or deleted any medications: No Patient Arrived: Courtney [name] Any new allergies or adverse reactions: No Arrival Time: 13:15 Had [name] fall or experienced change in No Accompanied By: spouse activities of daily living that may affect Transfer Assistance: None risk of falls: Patient Identification Verified: Yes Hospitalized since last visit: No Secondary Verification Process Completed: Yes Has Dressing in Place as Prescribed: Yes Patient Requires Transmission-Based No Pain Present Now: No Precautions: Patient Has Alerts: Yes Patient Alerts: Patient on Blood Thinner Left breast mass removed Electronic Signature(s) Signed: 11/06/2022 1:16:11 PM By: Courtney [name] Entered By: Courtney [name] on 11/06/2022 13:16:11 -------------------------------------------------------------------------------- Clinic Level of Care Assessment Details Patient Name: Date of Service: Courtney [name], Courtney [name] 11/06/2022 1:00 PM Medical Record Number: [id number] Patient Account Number: [id number] Date of Birth/Sex: Treating RN: 10-10-1940 (82 y.o. Courtney [name] Primary Care Hou[place] Nie: Courtney [name] Other Clinician: Referring Kenji Mapel: Treating Bradan Congrove/Extender: Courtney [name] in Treatment: 10 Clinic Level of Care Assessment Items TOOL 4 Quantity Score []  - 0 Use when only an EandM is performed on FOLLOW-UP visit ASSESSMENTS - Nursing Assessment /  Reassessment X- 1 10 Reassessment of Co-morbidities (includes updates in patient status) X- 1 5 Reassessment of Adherence to Treatment Plan ASSESSMENTS - Wound and Skin Assessment / Reassessment [name], [name] ([id number]) [id number] Page 2 of 8 X- 1 5 Simple Wound Assessment / Reassessment - one wound []  - 0 Complex Wound Assessment / Reassessment - multiple wounds []  - 0 Dermatologic / Skin Assessment (not related to wound area) ASSESSMENTS - Focused Assessment []  - 0 Circumferential Edema Measurements - multi extremities []  - 0 Nutritional Assessment / Counseling / Intervention []  - 0 Lower Extremity Assessment (monofilament, tuning fork, pulses) []  - 0 Peripheral Arterial Disease Assessment (using hand held doppler) ASSESSMENTS - Ostomy and/or Continence Assessment and Care []  - 0 Incontinence Assessment and Management []  - 0 Ostomy Care Assessment and Management (repouching, etc.) PROCESS - Coordination of Care X - Simple Patient / Family Education for ongoing care 1 15 []  - 0 Complex (extensive) Patient / Family Education for ongoing care X- 1 10 Staff obtains Chiropractor, Records, T Results / Process Orders est []  - 0 Staff telephones HHA, Nursing Homes / Clarify orders / etc []  - 0 Routine Transfer to another Facility (non-emergent condition) []  - 0 Routine Hospital Admission (non-emergent condition) []  - 0 New Admissions / Manufacturing engineer / Ordering NPWT Apligraf, etc. , []  - 0 Emergency Hospital Admission (emergent condition) X- 1 10 Simple Discharge Coordination []  - 0 Complex (extensive) Discharge Coordination PROCESS - Special Needs []  - 0 Pediatric / Minor Patient Management []  - 0 Isolation Patient Management []  - 0 Hearing / Language / Visual special needs []  - 0 Assessment of Community assistance (transportation, D/C planning, etc.) []  - 0 Additional assistance / Altered mentation []  - 0 Support Surface(s)  Assessment (bed, cushion, seat, etc.) INTERVENTIONS - Wound Cleansing / Measurement X - Simple Wound Cleansing - one wound 1 5 []  - 0 Complex Wound Cleansing - multiple wounds X- 1 5 Wound Imaging (photographs - any  Length: (cm) 0.1 Width: (cm) 0.2 Depth: (cm) 1.2 Area: (cm) 0.016 Volume: (cm) 0.019 % Reduction in Area: 99.2% % Reduction in Volume: 99.8% Epithelialization: Small (1-33%) Tunneling: No Undermining: No Wound Description Classification: Full Thickness With Exposed Support  Structures Exudate Amount: Medium Exudate Type: Serosanguineous Courtney [name], Courtney [name] ([id number]) Exudate Color: red, brown Foul Odor After Cleansing: No Slough/Fibrino Yes [id number] Page 8 of 8 Wound Bed Granulation Amount: Large (67-100%) Exposed Structure Granulation Quality: Pink, Friable Fat Layer (Subcutaneous Tissue) Exposed: Yes Necrotic Amount: None Present (0%) Treatment Notes Wound #2 (Breast) Wound Laterality: Left Cleanser Byram Ancillary Kit - 15 Day Supply Discharge Instruction: Use supplies as instructed; Kit contains: (15) Saline Bullets; (15) 3x3 Gauze; 15 pr Gloves Soap and Water Discharge Instruction: Gently cleanse wound with antibacterial soap, rinse and pat dry prior to dressing wounds Peri-Wound Care Topical Primary Dressing Aquacel Extra Hydrofiber Dressing, 4x5 (in/in) Secondary Dressing (BORDER) Zetuvit Plus SILICONE BORDER Dressing 5x5 (in/in) Discharge Instruction: Please do not put silicone bordered dressings under wraps. Use non-bordered dressing only. Secured With Compression Wrap Compression Stockings Add-Ons Electronic Signature(s) Signed: 11/06/2022 4:09:56 PM By: Courtney [name] Entered By: Courtney [name] on 11/06/2022 13:15:10 -------------------------------------------------------------------------------- Vitals Details Patient Name: Date of Service: Courtney Curd [name]. 11/06/2022 1:00 PM Medical Record Number: [id number] Patient Account Number: [id number] Date of Birth/Sex: Treating RN: 07-02-1940 (82 y.o. Courtney [name] Primary Care Lalo Tromp: Courtney [name] Other Clinician: Referring Khori Rosevear: Treating Nashaly Dorantes/Extender: Courtney [name] in Treatment: 10 Vital Signs Time Taken: 13:10 Temperature (F): 97.7 Height (in): 67 Pulse (bpm): 73 Weight (lbs): 180 Respiratory Rate (breaths/min): 18 Body Mass Index (BMI): 28.2 Blood Pressure (mmHg): 136/50 Reference Range: 80 - 120 mg /  dl Electronic Signature(s) Signed: 11/06/2022 1:16:36 PM By: Courtney [name] Entered By: Courtney [name] on 11/06/2022 13:16:36  red, brown N/[name] N/[name] Exudate Color: Large (67-100%) N/[name] N/[name] Granulation [name] mount: Pink, Friable N/[name] N/[name] Granulation Quality: None Present (0%) N/[name] N/[name] Necrotic [name] mount: Fat Layer (Subcutaneous Tissue): Yes N/[name] N/[name] Exposed Structures: Small (1-33%) N/[name] N/[name] EpithelializationSTELLE, Courtney [name] ([id number]) [id number] Page 5 of 8 Treatment Notes Electronic Signature(s) Signed: 11/06/2022 4:09:56 PM By: Courtney [name] Entered By: Courtney [name] on 11/06/2022 13:20:12 -------------------------------------------------------------------------------- Multi-Disciplinary Care Plan Details Patient Name: Date of Service: Courtney Curd [name]. 11/06/2022 1:00 PM Medical Record Number: [id number] Patient Account Number: [id number] Date of Birth/Sex: Treating RN: 1940/05/23 (82 y.o. Courtney [name] Primary Care Treyvin Glidden: Courtney [name] Other Clinician: Referring Kinsley Nicklaus: Treating Darl Brisbin/Extender: Courtney [name] in Treatment: 10 Active Inactive Wound/Skin Impairment Nursing Diagnoses: Impaired tissue integrity Knowledge deficit related to ulceration/compromised skin integrity Goals: Ulcer/skin breakdown will have [name] volume reduction of 30% by week 4 Date Initiated: 08/22/2022 Date Inactivated: 09/28/2022 Target Resolution  Date: 09/19/2022 Goal Status: Met Ulcer/skin breakdown will have [name] volume reduction of 50% by week 8 Date Initiated: 08/22/2022 Date Inactivated: 10/30/2022 Target Resolution Date: 10/24/2022 Goal Status: Met Ulcer/skin breakdown will have [name] volume reduction of 80% by week 12 Date Initiated: 08/22/2022 Target Resolution Date: 11/14/2022 Goal Status: Active Ulcer/skin breakdown will heal within 14 weeks Date Initiated: 08/22/2022 Target Resolution Date: 11/28/2022 Goal Status: Active Interventions: Assess patient/caregiver ability to obtain necessary supplies Assess patient/caregiver ability to perform ulcer/skin care regimen upon admission and as needed Assess ulceration(s) every visit Provide education on ulcer and skin care Treatment Activities: Skin care regimen initiated : 08/22/2022 Notes: Electronic Signature(s) Signed: 11/06/2022 4:09:56 PM By: Courtney [name] Entered By: Courtney [name] on 11/06/2022 13:26:52 Courtney [name] ([id number]) [id number] Page 6 of 8 -------------------------------------------------------------------------------- Pain Assessment Details Patient Name: Date of Service: DYLILAH, STASER 11/06/2022 1:00 PM Medical Record Number: [id number] Patient Account Number: [id number] Date of Birth/Sex: Treating RN: 10-17-1940 (82 y.o. Courtney [name] Primary Care Satara Virella: Courtney [name] Other Clinician: Referring Shronda Boeh: Treating Danaiya Steadman/Extender: Courtney [name] in Treatment: 10 Active Problems Location of Pain Severity and Description of Pain Patient Has Paino No Site Locations Rate the pain. Current Pain Level: 0 Pain Management and Medication Current Pain Management: Electronic Signature(s) Signed: 11/06/2022 1:16:45 PM By: Courtney [name] Entered By: Courtney [name] on 11/06/2022 13:16:45 -------------------------------------------------------------------------------- Patient/Caregiver Education  Details Patient Name: Date of Service: Courtney [name] 10/28/2024andnbsp1:00 PM Medical Record Number: [id number] Patient Account Number: [id number] Date of Birth/Gender: Treating RN: [birth year] (82 y.o. Courtney [name] Primary Care Physician: Courtney [name] Other Clinician: Referring Physician: Treating Physician/Extender: Courtney [name] in Treatment: [address] [name] ([id number]) [id number] Page 7 of 8 Education Assessment Education Provided To: Patient Education Topics Provided Wound/Skin Impairment: Handouts: Caring for Your Ulcer Methods: Explain/Verbal Responses: State content correctly Electronic Signature(s) Signed: 11/06/2022 4:09:56 PM By: Courtney [name] Entered By: Courtney [name] on 11/06/2022 13:27:04 -------------------------------------------------------------------------------- Wound Assessment Details Patient Name: Date of Service: Courtney [name], Courtney [name] 11/06/2022 1:00 PM Medical Record Number: [id number] Patient Account Number: [id number] Date of Birth/Sex: Treating RN: [birth year] (82 y.o. 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 Courtney [name] Primary Care Courtney [name]: Courtney [name] Other Clinician: Referring Courtney [name]: Treating Courtney [name]/Extender: Courtney [name] in Treatment: 10 Visit Information History Since Last Visit Added or deleted any medications: No Patient Arrived: Courtney [name] Any new allergies or adverse reactions: No Arrival Time: 13:15 Had [name] fall or experienced change in No Accompanied By: spouse activities of daily living that may affect Transfer Assistance: None risk of falls: Patient Identification Verified: Yes Hospitalized since last visit: No Secondary Verification Process Completed: Yes Has Dressing in Place as Prescribed: Yes Patient Requires Transmission-Based No Pain Present Now: No Precautions: Patient Has Alerts: Yes Patient Alerts: Patient on Blood Thinner Left breast mass removed Electronic Signature(s) Signed: 11/06/2022 1:16:11 PM By: Courtney [name] Entered By: Courtney [name] on 11/06/2022 13:16:11 -------------------------------------------------------------------------------- Clinic Level of Care Assessment Details Patient Name: Date of Service: Courtney [name], Courtney [name] 11/06/2022 1:00 PM Medical Record Number: [id number] Patient Account Number: [id number] Date of Birth/Sex: Treating RN: 10-10-1940 (82 y.o. Courtney [name] Primary Care [place]: Courtney [name] Other Clinician: Referring Kenji Mapel: Treating Bradan Congrove/Extender: Courtney [name] in Treatment: 10 Clinic Level of Care Assessment Items TOOL 4 Quantity Score []  - 0 Use when only an EandM is performed on FOLLOW-UP visit ASSESSMENTS - Nursing Assessment /  Reassessment X- 1 10 Reassessment of Co-morbidities (includes updates in patient status) X- 1 5 Reassessment of Adherence to Treatment Plan ASSESSMENTS - Wound and Skin Assessment / Reassessment [name], [name] ([id number]) [id number] Page 2 of 8 X- 1 5 Simple Wound Assessment / Reassessment - one wound []  - 0 Complex Wound Assessment / Reassessment - multiple wounds []  - 0 Dermatologic / Skin Assessment (not related to wound area) ASSESSMENTS - Focused Assessment []  - 0 Circumferential Edema Measurements - multi extremities []  - 0 Nutritional Assessment / Counseling / Intervention []  - 0 Lower Extremity Assessment (monofilament, tuning fork, pulses) []  - 0 Peripheral Arterial Disease Assessment (using hand held doppler) ASSESSMENTS - Ostomy and/or Continence Assessment and Care []  - 0 Incontinence Assessment and Management []  - 0 Ostomy Care Assessment and Management (repouching, etc.) PROCESS - Coordination of Care X - Simple Patient / Family Education for ongoing care 1 15 []  - 0 Complex (extensive) Patient / Family Education for ongoing care X- 1 10 Staff obtains Chiropractor, Records, T Results / Process Orders est []  - 0 Staff telephones HHA, Nursing Homes / Clarify orders / etc []  - 0 Routine Transfer to another Facility (non-emergent condition) []  - 0 Routine Hospital Admission (non-emergent condition) []  - 0 New Admissions / Manufacturing engineer / Ordering NPWT Apligraf, etc. , []  - 0 Emergency Hospital Admission (emergent condition) X- 1 10 Simple Discharge Coordination []  - 0 Complex (extensive) Discharge Coordination PROCESS - Special Needs []  - 0 Pediatric / Minor Patient Management []  - 0 Isolation Patient Management []  - 0 Hearing / Language / Visual special needs []  - 0 Assessment of Community assistance (transportation, D/C planning, etc.) []  - 0 Additional assistance / Altered mentation []  - 0 Support Surface(s)  Assessment (bed, cushion, seat, etc.) INTERVENTIONS - Wound Cleansing / Measurement X - Simple Wound Cleansing - one wound 1 5 []  - 0 Complex Wound Cleansing - multiple wounds X- 1 5 Wound Imaging (photographs - any

## 2022-11-06 NOTE — Progress Notes (Addendum)
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 Bathing/ Shower/ Hygiene May shower; gently cleanse wound with antibacterial soap, rinse and pat dry  prior to dressing wounds No tub bath. Anesthetic (Use 'Patient Medications' Section for Anesthetic Order Entry) Lidocaine applied to wound bed Wound Treatment Wound #2 - Breast Wound Laterality: Left Cleanser: Byram Ancillary Kit - 15 Day Supply (Generic) Every Other Day/15 Days Discharge Instructions: Use supplies as instructed; Kit contains: (15) Saline Bullets; (15) 3x3 Gauze; 15 pr Gloves Cleanser: Soap and Water Every Other Day/15 Days Discharge Instructions: Gently cleanse wound with antibacterial soap, rinse and pat dry prior to dressing wounds Prim Dressing: Aquacel Extra Hydrofiber Dressing, 4x5 (in/in) ary Every Other Day/15 Days MANOLA, BRUMFIELD A ([id number]) [id number] Page 4 of 7 Secondary Dressing: (BORDER) Zetuvit Plus SILICONE BORDER Dressing 5x5 (in/in) (Generic) Every Other Day/15 Days Discharge Instructions: Please do not put silicone bordered dressings under wraps. Use non-bordered dressing only. Electronic Signature(s) Signed: 11/06/2022 2:19:13 PM By: Allen Derry PA-C Signed: 11/06/2022 4:09:56 PM By: Angelina Pih Entered By: Angelina Pih on 11/06/2022 13:27:57 -------------------------------------------------------------------------------- Problem List Details Patient Name: Date of Service: [name] 11/06/2022 1:00 PM Medical Record Number: [id number] Patient Account Number: [id number] Date of Birth/Sex: Treating RN: [birth year] (82 y.o. 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 Postthrombotic syndrome with ulcer and inflammation of bilateral lower 08/22/2022 No Yes extremity I73.89 Other specified peripheral vascular diseases 08/22/2022 No Yes I10 Essential (primary) hypertension 08/22/2022 No Yes Inactive Problems Resolved Problems Electronic Signature(s) Signed: 11/06/2022 1:04:37 PM By: Allen Derry PA-C Entered By: Allen Derry on 11/06/2022 13:04:37 Kassis, Taisley A ([id number]) [id number] Page 5 of 7 -------------------------------------------------------------------------------- Progress Note Details Patient Name: Date of Service: MAANASA, AUGUSTE 11/06/2022 1:00 PM Medical Record Number: [id number] Patient Account Number: [id number] Date of Birth/Sex: Treating RN: 1940/10/09 (82 y.o. [name] Primary Care Provider: Bethann Punches Other Clinician: Referring Provider: Treating Provider/Extender: Margorie John in Treatment: 10 Subjective Chief Complaint Information obtained from Patient Left breast surgical ulcer History of Present Illness (HPI) 12-22-2021 upon evaluation today patient appears to be doing well currently in regard to her wound all things considered especially in light of the picture that they showed me from August when she initially had a skin tear. Fortunately there does not appear to be any signs of active infection locally nor systemically at this time which is great news and overall I am extremely pleased with the fact that she is making some progress here she has been placed on Cipro due to a culture result that was done by rheumatologist which showed that she had Pseudomonas and Staphylococcus aureus noted as the organisms in question. Fortunately I do not think that there is anything that seems to be still present or significant here I think she is actually doing much better which is great news. With that being  LITSA, CURE A ([id number]) [id number] Page 1 of 7 Visit Report for 11/06/2022 Chief Complaint Document Details Patient Name: Date of Service: RAMONITA, CHEREK 11/06/2022 1:00 PM Medical Record Number: [id number] Patient Account Number: [id number] Date of Birth/Sex: Treating RN: Aug 12, 1940 (82 y.o. [name] Primary Care Provider: Bethann Punches Other Clinician: Referring Provider: Treating Provider/Extender: Margorie John in Treatment: 10 Information Obtained from: Patient Chief Complaint Left breast surgical ulcer Electronic Signature(s) Signed: 11/06/2022 1:04:40 PM By: Allen Derry PA-C Entered By: Allen Derry on 11/06/2022 13:04:40 -------------------------------------------------------------------------------- HPI Details Patient Name: Date of Service: [name] 11/06/2022 1:00 PM Medical Record Number: [id number] Patient Account Number: [id number] Date of Birth/Sex: Treating RN: 03-Feb-1940 (82 y.o. [name] Primary Care Provider: Bethann Punches Other Clinician: Referring Provider: Treating Provider/Extender: Margorie John in Treatment: 10 History of Present Illness HPI Description: 12-22-2021 upon evaluation today patient appears to be doing well currently in regard to her wound all things considered especially in light of the picture that they showed me from August when she initially had a skin tear. Fortunately there does not appear to be any signs of active infection locally nor systemically at this time which is great news and overall I am extremely pleased with the fact that she is making some progress here she has been placed on Cipro due to a culture result that was done by rheumatologist which showed that she had Pseudomonas and Staphylococcus aureus noted as the organisms in question. Fortunately I do not think that there is anything that seems to be still present or significant  here I think she is actually doing much better which is great news. With that being said the patient tells me that she has been using Medihoney at this point which actually is really good does help to clear up a lot of this I do believe. In the beginning she was also placed on or rather in an Unna boot wrap based on what we are being told that was for a couple of weeks and then she did not have any additional follow-up with primary eventually rheumatology who were doing injections for her realize that she had a wound they stopped the injections which obviously would lower her immune response and healing chances. That is when she started on the antibiotics and subsequently also was referred to vascular. The good news is from a vascular standpoint she seems to be probably sufficient to heal with a right ABI of 1.02 and a left ABI of 1.02 as well. Her TBI on the right was 0.63 and on the left 0.54. She does have a history of diabetes mellitus type 2 and she has an A1c of 6.5 on 10-17-2021. Other than this she also does have chronic venous insufficiency her leg is swollen today I definitely think a compression wraps probably can help her she also has hypertension 12-29-2021 upon evaluation patient's wound is actually showing signs of excellent improvement. Fortunately I do not see any signs of infection I think she is making good progress here and overall I do believe that she is really looking quite nice as far as the overall appearance of the wound bed is concerned. There JANECIA, PURA A ([id number]) [id number] Page 2 of 7 is a little bit of necrotic tissue noted on the surface of the wound that is going require debridement today. 12/28; continued improvement in the area on the left lateral leg which was initially a skin tear in the  [id number] Patient Account Number: [id number] Date of Birth/Sex: Treating RN: 1940/12/06 (82 y.o. 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 bilateral lower extremity I73.89 Other specified peripheral vascular diseases I10 Essential (primary) hypertension Facility Procedures : CPT4 Code: 08657846 Description: 99213 - WOUND CARE VISIT-LEV 3 EST PT Modifier: Quantity:  1 Physician Procedures : CPT4 Code Description Modifier [id number] 99213 - WC PHYS LEVEL 3 - EST PT ICD-10 Diagnosis Description T81.31XA Disruption of external operation (surgical) wound, not elsewhere classified, initial encounter L98.492 Non-pressure chronic ulcer of skin of  other sites with fat layer exposed 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 [name] Primary Care Provider: Bethann Punches Other Clinician: Referring Provider: Treating Provider/Extender: Margorie John in Treatment: 10 Information Obtained from: Patient Chief Complaint Left breast surgical ulcer Electronic Signature(s) Signed: 11/06/2022 1:04:40 PM By: Allen Derry PA-C Entered By: Allen Derry on 11/06/2022 13:04:40 -------------------------------------------------------------------------------- HPI Details Patient Name: Date of Service: [name] 11/06/2022 1:00 PM Medical Record Number: [id number] Patient Account Number: [id number] Date of Birth/Sex: Treating RN: 03-Feb-1940 (82 y.o. [name] Primary Care Provider: Bethann Punches Other Clinician: Referring Provider: Treating Provider/Extender: Margorie John in Treatment: 10 History of Present Illness HPI Description: 12-22-2021 upon evaluation today patient appears to be doing well currently in regard to her wound all things considered especially in light of the picture that they showed me from August when she initially had a skin tear. Fortunately there does not appear to be any signs of active infection locally nor systemically at this time which is great news and overall I am extremely pleased with the fact that she is making some progress here she has been placed on Cipro due to a culture result that was done by rheumatologist which showed that she had Pseudomonas and Staphylococcus aureus noted as the organisms in question. Fortunately I do not think that there is anything that seems to be still present or significant  here I think she is actually doing much better which is great news. With that being said the patient tells me that she has been using Medihoney at this point which actually is really good does help to clear up a lot of this I do believe. In the beginning she was also placed on or rather in an Unna boot wrap based on what we are being told that was for a couple of weeks and then she did not have any additional follow-up with primary eventually rheumatology who were doing injections for her realize that she had a wound they stopped the injections which obviously would lower her immune response and healing chances. That is when she started on the antibiotics and subsequently also was referred to vascular. The good news is from a vascular standpoint she seems to be probably sufficient to heal with a right ABI of 1.02 and a left ABI of 1.02 as well. Her TBI on the right was 0.63 and on the left 0.54. She does have a history of diabetes mellitus type 2 and she has an A1c of 6.5 on 10-17-2021. Other than this she also does have chronic venous insufficiency her leg is swollen today I definitely think a compression wraps probably can help her she also has hypertension 12-29-2021 upon evaluation patient's wound is actually showing signs of excellent improvement. Fortunately I do not see any signs of infection I think she is making good progress here and overall I do believe that she is really looking quite nice as far as the overall appearance of the wound bed is concerned. There JANECIA, PURA A ([id number]) [id number] Page 2 of 7 is a little bit of necrotic tissue noted on the surface of the wound that is going require debridement today. 12/28; continued improvement in the area on the left lateral leg which was initially a skin tear in the  LITSA, CURE A ([id number]) [id number] Page 1 of 7 Visit Report for 11/06/2022 Chief Complaint Document Details Patient Name: Date of Service: RAMONITA, CHEREK 11/06/2022 1:00 PM Medical Record Number: [id number] Patient Account Number: [id number] Date of Birth/Sex: Treating RN: Aug 12, 1940 (82 y.o. [name] Primary Care Provider: Bethann Punches Other Clinician: Referring Provider: Treating Provider/Extender: Margorie John in Treatment: 10 Information Obtained from: Patient Chief Complaint Left breast surgical ulcer Electronic Signature(s) Signed: 11/06/2022 1:04:40 PM By: Allen Derry PA-C Entered By: Allen Derry on 11/06/2022 13:04:40 -------------------------------------------------------------------------------- HPI Details Patient Name: Date of Service: [name] 11/06/2022 1:00 PM Medical Record Number: [id number] Patient Account Number: [id number] Date of Birth/Sex: Treating RN: 03-Feb-1940 (82 y.o. [name] Primary Care Provider: Bethann Punches Other Clinician: Referring Provider: Treating Provider/Extender: Margorie John in Treatment: 10 History of Present Illness HPI Description: 12-22-2021 upon evaluation today patient appears to be doing well currently in regard to her wound all things considered especially in light of the picture that they showed me from August when she initially had a skin tear. Fortunately there does not appear to be any signs of active infection locally nor systemically at this time which is great news and overall I am extremely pleased with the fact that she is making some progress here she has been placed on Cipro due to a culture result that was done by rheumatologist which showed that she had Pseudomonas and Staphylococcus aureus noted as the organisms in question. Fortunately I do not think that there is anything that seems to be still present or significant  here I think she is actually doing much better which is great news. With that being said the patient tells me that she has been using Medihoney at this point which actually is really good does help to clear up a lot of this I do believe. In the beginning she was also placed on or rather in an Unna boot wrap based on what we are being told that was for a couple of weeks and then she did not have any additional follow-up with primary eventually rheumatology who were doing injections for her realize that she had a wound they stopped the injections which obviously would lower her immune response and healing chances. That is when she started on the antibiotics and subsequently also was referred to vascular. The good news is from a vascular standpoint she seems to be probably sufficient to heal with a right ABI of 1.02 and a left ABI of 1.02 as well. Her TBI on the right was 0.63 and on the left 0.54. She does have a history of diabetes mellitus type 2 and she has an A1c of 6.5 on 10-17-2021. Other than this she also does have chronic venous insufficiency her leg is swollen today I definitely think a compression wraps probably can help her she also has hypertension 12-29-2021 upon evaluation patient's wound is actually showing signs of excellent improvement. Fortunately I do not see any signs of infection I think she is making good progress here and overall I do believe that she is really looking quite nice as far as the overall appearance of the wound bed is concerned. There JANECIA, PURA A ([id number]) [id number] Page 2 of 7 is a little bit of necrotic tissue noted on the surface of the wound that is going require debridement today. 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 Bathing/ Shower/ Hygiene May shower; gently cleanse wound with antibacterial soap, rinse and pat dry  prior to dressing wounds No tub bath. Anesthetic (Use 'Patient Medications' Section for Anesthetic Order Entry) Lidocaine applied to wound bed Wound Treatment Wound #2 - Breast Wound Laterality: Left Cleanser: Byram Ancillary Kit - 15 Day Supply (Generic) Every Other Day/15 Days Discharge Instructions: Use supplies as instructed; Kit contains: (15) Saline Bullets; (15) 3x3 Gauze; 15 pr Gloves Cleanser: Soap and Water Every Other Day/15 Days Discharge Instructions: Gently cleanse wound with antibacterial soap, rinse and pat dry prior to dressing wounds Prim Dressing: Aquacel Extra Hydrofiber Dressing, 4x5 (in/in) ary Every Other Day/15 Days MANOLA, BRUMFIELD A ([id number]) [id number] Page 4 of 7 Secondary Dressing: (BORDER) Zetuvit Plus SILICONE BORDER Dressing 5x5 (in/in) (Generic) Every Other Day/15 Days Discharge Instructions: Please do not put silicone bordered dressings under wraps. Use non-bordered dressing only. Electronic Signature(s) Signed: 11/06/2022 2:19:13 PM By: Allen Derry PA-C Signed: 11/06/2022 4:09:56 PM By: Angelina Pih Entered By: Angelina Pih on 11/06/2022 13:27:57 -------------------------------------------------------------------------------- Problem List Details Patient Name: Date of Service: [name] 11/06/2022 1:00 PM Medical Record Number: [id number] Patient Account Number: [id number] Date of Birth/Sex: Treating RN: [birth year] (82 y.o. 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 Postthrombotic syndrome with ulcer and inflammation of bilateral lower 08/22/2022 No Yes extremity I73.89 Other specified peripheral vascular diseases 08/22/2022 No Yes I10 Essential (primary) hypertension 08/22/2022 No Yes Inactive Problems Resolved Problems Electronic Signature(s) Signed: 11/06/2022 1:04:37 PM By: Allen Derry PA-C Entered By: Allen Derry on 11/06/2022 13:04:37 Kassis, Taisley A ([id number]) [id number] Page 5 of 7 -------------------------------------------------------------------------------- Progress Note Details Patient Name: Date of Service: MAANASA, AUGUSTE 11/06/2022 1:00 PM Medical Record Number: [id number] Patient Account Number: [id number] Date of Birth/Sex: Treating RN: 1940/10/09 (82 y.o. [name] Primary Care Provider: Bethann Punches Other Clinician: Referring Provider: Treating Provider/Extender: Margorie John in Treatment: 10 Subjective Chief Complaint Information obtained from Patient Left breast surgical ulcer History of Present Illness (HPI) 12-22-2021 upon evaluation today patient appears to be doing well currently in regard to her wound all things considered especially in light of the picture that they showed me from August when she initially had a skin tear. Fortunately there does not appear to be any signs of active infection locally nor systemically at this time which is great news and overall I am extremely pleased with the fact that she is making some progress here she has been placed on Cipro due to a culture result that was done by rheumatologist which showed that she had Pseudomonas and Staphylococcus aureus noted as the organisms in question. Fortunately I do not think that there is anything that seems to be still present or significant here I think she is actually doing much better which is great news. With that being  [id number] Patient Account Number: [id number] Date of Birth/Sex: Treating RN: 1940/12/06 (82 y.o. 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 bilateral lower extremity I73.89 Other specified peripheral vascular diseases I10 Essential (primary) hypertension Facility Procedures : CPT4 Code: 08657846 Description: 99213 - WOUND CARE VISIT-LEV 3 EST PT Modifier: Quantity:  1 Physician Procedures : CPT4 Code Description Modifier [id number] 99213 - WC PHYS LEVEL 3 - EST PT ICD-10 Diagnosis Description T81.31XA Disruption of external operation (surgical) wound, not elsewhere classified, initial encounter L98.492 Non-pressure chronic ulcer of skin of  other sites with fat layer exposed 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 We have been using Dakin's wet to dry. The patient complains of mild stinging at the orifice of the wound from the wet Dakin's wicked gauze 09-14-2022 upon evaluation today patient appears to be doing well with regard to the wound on her left breast. This actually appears to be very clean and I do believe is making excellent headway towards being completely closed. Fortunately I do not see any signs of infection at this time. No fevers, chills, nausea, vomiting, or diarrhea. 09-28-2022 upon evaluation today patient appears to be doing decently well currently in  regard to her wound. She tells me the [facility] did not do so great for her it caused a lot of bleeding and it is somewhat uncomfortable. For that reason she switched back to the Dakin's moistened gauze packing which seems to be doing much better for her. 10-16-2022 upon evaluation today patient's wound actually showed signs of excellent improvement actually very pleased with where we stand I do believe that the patient is making good headway towards complete closure. Fortunately I do not see any evidence of worsening overall and I believe that the patient is making good headway at this time. 10-30-2022 upon evaluation today patient appears to be doing well currently in regard to her wound which is actually showing signs of improvement in fact this is getting very small to the point she cannot even pack into this anymore with the Dakin's moistened gauze. 11-06-2022 upon evaluation today patient appears to be doing well currently in regard to her wound which is actually showing signs of good improvement. Fortunately I do not see any signs of active infection at this time which is great news her wound is doing much better there is a little irritation of the skin but she thinks this may have been just some drainage. Electronic Signature(s) Signed: 11/06/2022 1:26:27 PM By: Allen Derry PA-C Entered By: Allen Derry on 11/06/2022 13:26:27 -------------------------------------------------------------------------------- Physical Exam Details Patient Name: Date of Service: STA Tamala Ser A. 11/06/2022 1:00 PM Medical Record Number: [id number] Patient Account Number: [id number] Date of Birth/Sex: Treating RN: 1940/07/05 (82 y.o. [name] Primary Care Provider: Bethann Punches Other Clinician: Referring Provider: Treating Provider/Extender: Margorie John in Treatment: [address] A ([id number]) [id number] Page 3 of  7 Constitutional Well-nourished and well-hydrated in no acute distress. Respiratory normal breathing without difficulty. Psychiatric this patient is able to make decisions and demonstrates good insight into disease process. Alert and Oriented x 3. pleasant and cooperative. Notes Upon inspection patient's wound bed actually showed signs of good granulation epithelization at this point. Fortunately I do not see any signs of active infection looking or systemically which is great news and in general I do believe that we are making really good headway here towards closure. Electronic Signature(s) Signed: 11/06/2022 1:27:03 PM By: Allen Derry PA-C Entered By: Allen Derry on 11/06/2022 13:27:03 -------------------------------------------------------------------------------- Physician Orders Details Patient Name: Date of Service: [name] 11/06/2022 1:00 PM Medical Record Number: [id number] Patient Account Number: [id number] Date of Birth/Sex: Treating RN: March 10, 1940 (82 y.o. 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

## 2022-11-13 ENCOUNTER — Ambulatory Visit: Payer: Medicare Other | Admitting: Physician Assistant

## 2022-11-14 ENCOUNTER — Other Ambulatory Visit (INDEPENDENT_AMBULATORY_CARE_PROVIDER_SITE_OTHER): Payer: Self-pay | Admitting: Nurse Practitioner

## 2022-11-14 DIAGNOSIS — I739 Peripheral vascular disease, unspecified: Secondary | ICD-10-CM

## 2022-11-20 ENCOUNTER — Encounter (INDEPENDENT_AMBULATORY_CARE_PROVIDER_SITE_OTHER): Payer: Self-pay | Admitting: Vascular Surgery

## 2022-11-20 ENCOUNTER — Encounter: Payer: Medicare Other | Attending: Physician Assistant | Admitting: Physician Assistant

## 2022-11-20 ENCOUNTER — Ambulatory Visit (INDEPENDENT_AMBULATORY_CARE_PROVIDER_SITE_OTHER): Payer: Medicare Other

## 2022-11-20 ENCOUNTER — Ambulatory Visit (INDEPENDENT_AMBULATORY_CARE_PROVIDER_SITE_OTHER): Payer: Medicare Other | Admitting: Vascular Surgery

## 2022-11-20 VITALS — BP 140/69 | HR 74 | Resp 16 | Wt 176.4 lb

## 2022-11-20 DIAGNOSIS — E1151 Type 2 diabetes mellitus with diabetic peripheral angiopathy without gangrene: Secondary | ICD-10-CM | POA: Insufficient documentation

## 2022-11-20 DIAGNOSIS — I739 Peripheral vascular disease, unspecified: Secondary | ICD-10-CM

## 2022-11-20 DIAGNOSIS — M48062 Spinal stenosis, lumbar region with neurogenic claudication: Secondary | ICD-10-CM

## 2022-11-20 DIAGNOSIS — I10 Essential (primary) hypertension: Secondary | ICD-10-CM | POA: Diagnosis not present

## 2022-11-20 DIAGNOSIS — E782 Mixed hyperlipidemia: Secondary | ICD-10-CM | POA: Diagnosis not present

## 2022-11-20 DIAGNOSIS — L98492 Non-pressure chronic ulcer of skin of other sites with fat layer exposed: Secondary | ICD-10-CM | POA: Insufficient documentation

## 2022-11-20 DIAGNOSIS — E1140 Type 2 diabetes mellitus with diabetic neuropathy, unspecified: Secondary | ICD-10-CM | POA: Insufficient documentation

## 2022-11-20 DIAGNOSIS — E11622 Type 2 diabetes mellitus with other skin ulcer: Secondary | ICD-10-CM | POA: Diagnosis present

## 2022-11-20 DIAGNOSIS — I872 Venous insufficiency (chronic) (peripheral): Secondary | ICD-10-CM | POA: Insufficient documentation

## 2022-11-20 DIAGNOSIS — M069 Rheumatoid arthritis, unspecified: Secondary | ICD-10-CM | POA: Diagnosis not present

## 2022-11-20 NOTE — Progress Notes (Signed)
[name], [name] [name] ([id number]) [id number] Page 1 of 9 Visit Report for 11/20/2022 Arrival Information Details Patient Name: Date of Service: Courtney [name], Courtney [name] 11/20/2022 2:00 PM Medical Record Number: [id number] Patient Account Number: [id number] Date of Birth/Sex: Treating RN: [birth year] (82 y.o. Courtney [name] Primary Care Chirstopher Iovino: Bethann Punches Other Clinician: Referring Hobert Poplaski: Treating Makiah Foye/Extender: Margorie John in Treatment: 12 Visit Information History Since Last Visit Added or deleted any medications: No Patient Arrived: Gil[place] Mor Any new allergies or adverse reactions: No Arrival Time: 14:05 Had [name] fall or experienced change in No Accompanied By: family activities of daily living that may affect Transfer Assistance: None risk of falls: Patient Identification Verified: Yes Hospitalized since last visit: No Secondary Verification Process Completed: Yes Has Dressing in Place as Prescribed: Yes Patient Requires Transmission-Based No Pain Present Now: Yes Precautions: Patient Has Alerts: Yes Patient Alerts: Patient on Blood Thinner Left breast mass removed Electronic Signature(s) Signed: 11/20/2022 4:17:41 PM By: Angelina Pih Entered By: Angelina Pih on 11/20/2022 11:05:54 -------------------------------------------------------------------------------- Clinic Level of Care Assessment Details Patient Name: Date of Service: Courtney [name] 11/20/2022 2:00 PM Medical Record Number: [id number] Patient Account Number: [id number] Date of Birth/Sex: Treating RN: October 25, 1940 (82 y.o. Courtney [name] Primary Care Joane Postel: Bethann Punches Other Clinician: Referring French Kendra: Treating Naol Ontiveros/Extender: Margorie John in Treatment: 12 Clinic Level of Care Assessment Items TOOL 4 Quantity Score []  - 0 Use when only an EandM is performed on FOLLOW-UP visit ASSESSMENTS - Nursing Assessment /  Reassessment X- 1 10 Reassessment of Co-morbidities (includes updates in patient status) X- 1 5 Reassessment of Adherence to Treatment Plan ASSESSMENTS - Wound and Skin Assessment / Reassessment Courtney [name] [name] ([id number]) [id number] Page 2 of 9 X- 1 5 Simple Wound Assessment / Reassessment - one wound []  - 0 Complex Wound Assessment / Reassessment - multiple wounds []  - 0 Dermatologic / Skin Assessment (not related to wound area) ASSESSMENTS - Focused Assessment []  - 0 Circumferential Edema Measurements - multi extremities []  - 0 Nutritional Assessment / Counseling / Intervention []  - 0 Lower Extremity Assessment (monofilament, tuning fork, pulses) []  - 0 Peripheral Arterial Disease Assessment (using hand held doppler) ASSESSMENTS - Ostomy and/or Continence Assessment and Care []  - 0 Incontinence Assessment and Management []  - 0 Ostomy Care Assessment and Management (repouching, etc.) PROCESS - Coordination of Care X - Simple Patient / Family Education for ongoing care 1 15 []  - 0 Complex (extensive) Patient / Family Education for ongoing care X- 1 10 Staff obtains Chiropractor, Records, T Results / Process Orders est []  - 0 Staff telephones HHA, Nursing Homes / Clarify orders / etc []  - 0 Routine Transfer to another Facility (non-emergent condition) []  - 0 Routine Hospital Admission (non-emergent condition) []  - 0 New Admissions / Manufacturing engineer / Ordering NPWT Apligraf, etc. , []  - 0 Emergency Hospital Admission (emergent condition) X- 1 10 Simple Discharge Coordination []  - 0 Complex (extensive) Discharge Coordination PROCESS - Special Needs []  - 0 Pediatric / Minor Patient Management []  - 0 Isolation Patient Management []  - 0 Hearing / Language / Visual special needs []  - 0 Assessment of Community assistance (transportation, D/C planning, etc.) []  - 0 Additional assistance / Altered mentation []  - 0 Support Surface(s)  Assessment (bed, cushion, seat, etc.) INTERVENTIONS - Wound Cleansing / Measurement X - Simple Wound Cleansing - one wound 1 5 []  - 0 Complex Wound Cleansing - multiple wounds X- 1 5 Wound Imaging (photographs - any  number of wounds) []  - 0 Wound Tracing (instead of photographs) X- 1 5 Simple Wound Measurement - one wound []  - 0 Complex Wound Measurement - multiple wounds INTERVENTIONS - Wound Dressings X - Small Wound Dressing one or multiple wounds 1 10 []  - 0 Medium Wound Dressing one or multiple wounds []  - 0 Large Wound Dressing one or multiple wounds []  - 0 Application of Medications - topical []  - 0 Application of Medications - injection INTERVENTIONS - Miscellaneous []  - 0 External ear exam []  - 0 Specimen Collection (cultures, biopsies, blood, body fluids, etc.) Courtney [name] [name] ([id number]) [id number] Page 3 of 9 []  - 0 Specimen(s) / Culture(s) sent or taken to Lab for analysis []  - 0 Patient Transfer (multiple staff / Michiel Sites Lift / Similar devices) []  - 0 Simple Staple / Suture removal (25 or less) []  - 0 Complex Staple / Suture removal (26 or more) []  - 0 Hypo / Hyperglycemic Management (close monitor of Blood Glucose) []  - 0 Ankle / Brachial Index (ABI) - do not check if billed separately X- 1 5 Vital Signs Has the patient been seen at the hospital within the last three years: Yes Total Score: 85 Level Of Care: New/Established - Level 3 Electronic Signature(s) Signed: 11/20/2022 4:17:41 PM By: Angelina Pih Entered By: Angelina Pih on 11/20/2022 11:39:22 -------------------------------------------------------------------------------- Encounter Discharge Information Details Patient Name: Date of Service: Courtney [name]. 11/20/2022 2:00 PM Medical Record Number: [id number] Patient Account Number: [id number] Date of Birth/Sex: Treating RN: 1940/05/07 (82 y.o. Courtney [name] Primary Care Stefon Ramthun: Bethann Punches Other  Clinician: Referring Gayle Collard: Treating Airanna Partin/Extender: Margorie John in Treatment: 12 Encounter Discharge Information Items Discharge Condition: Stable Ambulatory Status: Cane Discharge Destination: Home Transportation: Private Auto Accompanied By: family Schedule Follow-up Appointment: Yes Clinical Summary of Care: Patient Declined Electronic Signature(s) Signed: 11/20/2022 2:41:59 PM By: Angelina Pih Entered By: Angelina Pih on 11/20/2022 11:41:59 -------------------------------------------------------------------------------- Lower Extremity Assessment Details Patient Name: Date of Service: Courtney [name], Courtney [name] 11/20/2022 2:00 PM Medical Record Number: [id number] Patient Account Number: [id number] Courtney [name], Courtney [name] ([id number]) [phone].pdf Page 4 of 9 Date of Birth/Sex: Treating RN: 02-May-1940 (82 y.o. Courtney [name] Primary Care Safia Panzer: Other Clinician: Bethann Punches Referring Laportia Carley: Treating Nygeria Lager/Extender: Margorie John in Treatment: 12 Electronic Signature(s) Signed: 11/20/2022 4:17:41 PM By: Angelina Pih Entered By: Angelina Pih on 11/20/2022 11:10:19 -------------------------------------------------------------------------------- Multi Wound Chart Details Patient Name: Date of Service: Courtney [name]. 11/20/2022 2:00 PM Medical Record Number: [id number] Patient Account Number: [id number] Date of Birth/Sex: Treating RN: 1940-12-25 (82 y.o. Courtney [name] Primary Care Gerlad Pelzel: Bethann Punches Other Clinician: Referring Kadedra Vanaken: Treating Emanuelle Hammerstrom/Extender: Margorie John in Treatment: 12 Vital Signs Height(in): 67 Pulse(bpm): 75 Weight(lbs): 180 Blood Pressure(mmHg): 115/95 Body Mass Index(BMI): 28.2 Temperature(F): 97.8 Respiratory Rate(breaths/min): 18 [2:Photos:] [N/[name]:N/[name]] Left Breast N/[name] N/[name] Wound Location: Surgical Injury N/[name] N/[name] Wounding  Event: Open Surgical Wound N/[name] N/[name] Primary Etiology: Sleep Apnea, Hypertension, Peripheral N/[name] N/[name] Comorbid History: Arterial Disease, Type II Diabetes, Rheumatoid Arthritis, Osteoarthritis, Neuropathy 06/19/2022 N/[name] N/[name] Date Acquired: 12 N/[name] N/[name] Weeks of Treatment: Open N/[name] N/[name] Wound Status: No N/[name] N/[name] Wound Recurrence: 0.1x0.2x0.4 N/[name] N/[name] Measurements L x W x D (cm) 0.016 N/[name] N/[name] [name] (cm) : rea 0.006 N/[name] N/[name] Volume (cm) : 99.20% N/[name] N/[name] % Reduction in Area: 99.90% N/[name] N/[name] % Reduction in Volume: Full Thickness With Exposed Support N/[name] N/[name] Classification: Structures Medium N/[name] N/[name] Exudate [name] mount: Serosanguineous N/[name] N/[name]  Exudate Type: red, brown N/[name] N/[name] Exudate Color: Large (67-100%) N/[name] N/[name] Granulation [name] mount: Pink N/[name] N/[name] Granulation Quality: None Present (0%) N/[name] N/[name] Necrotic [name] mount: Fat Layer (Subcutaneous Tissue): No N/[name] N/[name] Exposed Structures: Small (1-33%) N/[name] N/[name] EpithelializationRAVON, Courtney [name] ([id number]) [id number] Page 5 of 9 Treatment Notes Electronic Signature(s) Signed: 11/20/2022 2:38:40 PM By: Angelina Pih Entered By: Angelina Pih on 11/20/2022 11:38:40 -------------------------------------------------------------------------------- Multi-Disciplinary Care Plan Details Patient Name: Date of Service: Courtney [name]. 11/20/2022 2:00 PM Medical Record Number: [id number] Patient Account Number: [id number] Date of Birth/Sex: Treating RN: 1940-12-24 (82 y.o. Courtney [name] Primary Care Mazey Mantell: Bethann Punches Other Clinician: Referring Ericberto Padget: Treating Freeland Pracht/Extender: Margorie John in Treatment: 12 Active Inactive Wound/Skin Impairment Nursing Diagnoses: Impaired tissue integrity Knowledge deficit related to ulceration/compromised skin integrity Goals: Ulcer/skin breakdown will have [name] volume reduction of 30% by week 4 Date Initiated: 08/22/2022 Date Inactivated:  09/28/2022 Target Resolution Date: 09/19/2022 Goal Status: Met Ulcer/skin breakdown will have [name] volume reduction of 50% by week 8 Date Initiated: 08/22/2022 Date Inactivated: 10/30/2022 Target Resolution Date: 10/24/2022 Goal Status: Met Ulcer/skin breakdown will have [name] volume reduction of 80% by week 12 Date Initiated: 08/22/2022 Date Inactivated: 11/20/2022 Target Resolution Date: 11/14/2022 Goal Status: Met Ulcer/skin breakdown will heal within 14 weeks Date Initiated: 08/22/2022 Target Resolution Date: 11/28/2022 Goal Status: Active Interventions: Assess patient/caregiver ability to obtain necessary supplies Assess patient/caregiver ability to perform ulcer/skin care regimen upon admission and as needed Assess ulceration(s) every visit Provide education on ulcer and skin care Treatment Activities: Skin care regimen initiated : 08/22/2022 Notes: Electronic Signature(s) Signed: 11/20/2022 2:40:25 PM By: Angelina Pih Entered By: Angelina Pih on 11/20/2022 11:40:25 Courtney [name] ([id number]) [id number] Page 6 of 9 -------------------------------------------------------------------------------- Pain Assessment Details Patient Name: Date of Service: CHLO…, Courtney [name] 11/20/2022 2:00 PM Medical Record Number: [id number] Patient Account Number: [id number] Date of Birth/Sex: Treating RN: [birth year] (82 y.o. Courtney [name] Primary Care Durene Dodge: Bethann Punches Other Clinician: Referring Niranjan Rufener: Treating Lun Muro/Extender: Margorie John in Treatment: 12 Active Problems Location of Pain Severity and Description of Pain Patient Has Paino Yes Site Locations Pain Management and Medication Current Pain Management: Notes generalized arthritis pain Electronic Signature(s) Signed: 11/20/2022 4:17:41 PM By: Angelina Pih Entered By: Angelina Pih on 11/20/2022  11:06:21 -------------------------------------------------------------------------------- Patient/Caregiver Education Details Patient Name: Date of Service: Courtney [name] 11/11/2024andnbsp2:00 PM Medical Record Number: [id number] Patient Account Number: [id number] Date of Birth/Gender: Treating RN: Nov 09, 1940 (82 y.o. Courtney [name] Primary Care Physician: Bethann Punches Other Clinician: INETTA, Courtney [name] ([id number]) [id number] Page 7 of 9 Referring Physician: Treating Physician/Extender: Margorie John in Treatment: 12 Education Assessment Education Provided To: Patient Education Topics Provided Wound/Skin Impairment: Handouts: Caring for Your Ulcer Methods: Explain/Verbal Responses: State content correctly Electronic Signature(s) Signed: 11/20/2022 4:17:41 PM By: Angelina Pih Entered By: Angelina Pih on 11/20/2022 11:40:36 -------------------------------------------------------------------------------- Wound Assessment Details Patient Name: Date of Service: Courtney [name], Courtney [name] 11/20/2022 2:00 PM Medical Record Number: [id number] Patient Account Number: [id number] Date of Birth/Sex: Treating RN: 11/01/1940 (82 y.o. Courtney [name] Primary Care Amiria Orrison: Bethann Punches Other Clinician: Referring Artyom Stencel: Treating Katherine Syme/Extender: Margorie John in Treatment: 12 Wound Status Wound Number: 2 Primary Open Surgical Wound Etiology: Wound Location: Left Breast Wound Open Wounding Event: Surgical Injury Status: Date Acquired: 06/19/2022 Comorbid Sleep Apnea, Hypertension, Peripheral Arterial Disease, Type II Weeks Of Treatment: 12 History: Diabetes, Rheumatoid Arthritis, Osteoarthritis, Neuropathy Clustered Wound: No Photos Wound  Measurements Length: (cm) 0.1 Width: (cm) 0.2 Depth: (cm) 0.4 Area: (cm) 0.016 Volume: (cm) 0.006 % Reduction in Area: 99.2% % Reduction in Volume:  99.9% Epithelialization: Small (1-33%) Tunneling: No Undermining: No Wound Description Courtney [name], Courtney [name] ([id number]) Classification: Full Thickness With Exposed Support Structures Exudate Amount: Medium Exudate Type: Serosanguineous Exudate Color: red, brown [id number] Page 8 of 9 Foul Odor After Cleansing: No Slough/Fibrino Yes Wound Bed Granulation Amount: Large (67-100%) Exposed Structure Granulation Quality: Pink Fat Layer (Subcutaneous Tissue) Exposed: No Necrotic Amount: None Present (0%) Treatment Notes Wound #2 (Breast) Wound Laterality: Left Cleanser [place] Ancillary Kit - 15 Day Supply Discharge Instruction: Use supplies as instructed; Kit contains: (15) Saline Bullets; (15) 3x3 Gauze; 15 pr Gloves Soap and Water Discharge Instruction: Gently cleanse wound with antibacterial soap, rinse and pat dry prior to dressing wounds Peri-Wound Care Topical calmoseptine Primary Dressing Gauze Discharge Instruction: As directed: dry Secondary Dressing Secured With Compression Wrap Compression Stockings Add-Ons Electronic Signature(s) Signed: 11/20/2022 4:17:41 PM By: Angelina Pih Entered By: Angelina Pih on 11/20/2022 11:10:10 -------------------------------------------------------------------------------- Vitals Details Patient Name: Date of Service: Courtney [name]. 11/20/2022 2:00 PM Medical Record Number: [id number] Patient Account Number: [id number] Date of Birth/Sex: Treating RN: 07/03/1940 (82 y.o. Courtney [name] Primary Care Ozias Dicenzo: Bethann Punches Other Clinician: Referring Daquon Greenleaf: Treating Muhsin Doris/Extender: Margorie John in Treatment: 12 Vital Signs Time Taken: 14:05 Temperature (F): 97.8 Height (in): 67 Pulse (bpm): 75 Weight (lbs): 180 Respiratory Rate (breaths/min): 18 Body Mass Index (BMI): 28.2 Blood Pressure (mmHg): 115/95 Reference Range: 80 - 120 mg / dl Electronic Signature(s) Signed:  11/20/2022 4:17:41 PM By: Hurshel PartyMervyn Skeeters ([id number]) [place] 11/20/2022 4:17:41 PM By: Kayleen Memos.pdf Page 9 of 9 Entered By: Angelina Pih on 11/20/2022 11:06:09

## 2022-11-20 NOTE — Progress Notes (Addendum)
SAMI, FARINAS [name] ([id number]) [id number] Page 1 of 7 Visit Report for 11/20/2022 Chief Complaint Document Details Patient Name: Date of Service: Courtney [name], Courtney [name] 11/20/2022 2:00 PM Medical Record Number: [id number] Patient Account Number: [id number] Date of Birth/Sex: Treating RN: Jan 18, 1940 (82 y.o. [name] Primary Care Provider: Bethann Punches Other Clinician: Referring Provider: Treating Provider/Extender: Margorie John in Treatment: 12 Information Obtained from: Patient Chief Complaint Left breast surgical ulcer Electronic Signature(s) Signed: 11/20/2022 2:17:37 PM By: Allen Derry PA-C Entered By: Allen Derry on 11/20/2022 11:17:37 -------------------------------------------------------------------------------- HPI Details Patient Name: Date of Service: Courtney Curd [name]. 11/20/2022 2:00 PM Medical Record Number: [id number] Patient Account Number: [id number] Date of Birth/Sex: Treating RN: 12/20/1940 (82 y.o. [name] Primary Care Provider: Bethann Punches Other Clinician: Referring Provider: Treating Provider/Extender: Margorie John in Treatment: 12 History of Present Illness HPI Description: 12-22-2021 upon evaluation today patient appears to be doing well currently in regard to her wound all things considered especially in light of the picture that they showed me from August when she initially had [name] skin tear. Fortunately there does not appear to be any signs of active infection locally nor systemically at this time which is great news and overall I am extremely pleased with the fact that she is making some progress here she has been placed on Cipro due to [name] culture result that was done by rheumatologist which showed that she had Pseudomonas and Staphylococcus aureus noted as the organisms in question. Fortunately I do not think that there is anything that seems to be still present or significant  here I think she is actually doing much better which is great news. With that being said the patient tells me that she has been using Medihoney at this point which actually is really good does help to clear up [name] lot of this I do believe. In the beginning she was also placed on or rather in an Unna boot wrap based on what we are being told that was for [name] couple of weeks and then she did not have any additional follow-up with primary eventually rheumatology who were doing injections for her realize that she had [name] wound they stopped the injections which obviously would lower her immune response and healing chances. That is when she started on the antibiotics and subsequently also was referred to vascular. The good news is from [name] vascular standpoint she seems to be probably sufficient to heal with [name] right ABI of 1.02 and [name] left ABI of 1.02 as well. Her TBI on the right was 0.63 and on the left 0.54. She does have [name] history of diabetes mellitus type 2 and she has an A1c of 6.5 on 10-17-2021. Other than this she also does have chronic venous insufficiency her leg is swollen today I definitely think [name] compression wraps probably can help her she also has hypertension 12-29-2021 upon evaluation patient's wound is actually showing signs of excellent improvement. Fortunately I do not see any signs of infection I think she is making good progress here and overall I do believe that she is really looking quite nice as far as the overall appearance of the wound bed is concerned. There YMANI, THAEMERT [name] ([id number]) [id number] Page 2 of 7 is [name] little bit of necrotic tissue noted on the surface of the wound that is going require debridement today. 12/28; continued improvement in the area on the left lateral leg which was initially [name] skin tear in the  setting of chronic venous insufficiency. She has been using Prisma with 3 layer compression. The length of the wound is down 0.5 cm 01-12-2022 upon  evaluation today patient appears to be doing well currently in regard to her leg ulcer. She has been tolerating dressing changes without complication. Fortunately I do not see any signs of active infection locally nor systemically which is great news. No fevers, chills, nausea, vomiting, or diarrhea. 01-20-2022 upon evaluation today patient appears to be doing well currently in regard to her wound. In fact this is measuring significantly smaller and I am very pleased with where we stand. I do not see any signs of active infection locally or systemically at this time. No fevers, chills, nausea, vomiting, or diarrhea. 02-03-2022 upon evaluation today patient appears to be doing well currently in regard to her wound. In fact she appears to be completely healed based on what I am seeing. Fortunately I do not see any signs of active infection locally nor systemically which is great news. There is little piece of dried skin where the dressing had stuck once I remove this however there was nothing remaining underneath.' Readmission: 08-22-2022 this is an individual whom I am just seen actually at the beginning of the year although she healed this was in regard to her legs and her legs have continued to do quite well although she did have some issues with cellulitis but that was taking care of recently she has not been wearing her compression socks I did have [name] brief conversation with her about the fact that she needs to make sure to wear these on [name] regular basis she voiced understanding. Unfortunately the patient around January which is when I was seeing her of this past year started to have [name] mass noted in her left breast. Subsequent that in December she had had some nipple drainage although she did not pay too much attention to it even with [name] mass noted. By March she had bloody drainage coming out of this and there was the mass actually identified by way of I believe ultrasound at that point. They went in from  [name] surgical standpoint on June 10 and remove the mass the good news is this was not cancer the unfortunate news that she is having [name] difficult time getting this to heal following. She has not been told to pack this although she tells me she was put [name] little bit of gauze in herself. Fortunately I do not see any signs of active infection at this point which is good news. She did request [name] referral back to [place] to try to help get this closed as we have done well with her leg. Patient's most recent hemoglobin A1c was on 07-26-2022 and was 7.1 this is doing pretty well at this point in my opinion. Otherwise her past medical history really has not changed significantly. 08-29-2022 upon evaluation today patient appears to be doing pretty well currently in regard to the wound currently on the left breast location. She has been tolerating the dressing changes without complication and fortunately this actually seems to be much less deep compared to last week's evaluation. I am extremely pleased with where we stand today and I do not see any signs of active infection at this time. 8/27; patient who had multiple at least 2 biopsies and procedures for [name] mass in the left breast. 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 Nevertheless she has been left with  [name] nonhealing wound area just superior to the nipple area. We have been using Dakin's wet to dry. The patient complains of mild stinging at the orifice of the wound from the wet Dakin's wicked gauze 09-14-2022 upon evaluation today patient appears to be doing well with regard to the wound on her left breast. This actually appears to be very clean and I do believe is making excellent headway towards being completely closed. Fortunately I do not see any signs of infection at this time. No fevers, chills, nausea, vomiting, or diarrhea. 09-28-2022 upon evaluation today patient appears to be doing decently well currently in  regard to her wound. She tells me the [facility] did not do so great for her it caused [name] lot of bleeding and it is somewhat uncomfortable. For that reason she switched back to the Dakin's moistened gauze packing which seems to be doing much better for her. 10-16-2022 upon evaluation today patient's wound actually showed signs of excellent improvement actually very pleased with where we stand I do believe that the patient is making good headway towards complete closure. Fortunately I do not see any evidence of worsening overall and I believe that the patient is making good headway at this time. 10-30-2022 upon evaluation today patient appears to be doing well currently in regard to her wound which is actually showing signs of improvement in fact this is getting very small to the point she cannot even pack into this anymore with the Dakin's moistened gauze. 11-06-2022 upon evaluation today patient appears to be doing well currently in regard to her wound which is actually showing signs of good improvement. Fortunately I do not see any signs of active infection at this time which is great news her wound is doing much better there is [name] little irritation of the skin but she thinks this may have been just some drainage. 11-20-2022 upon evaluation today patient appears to be doing well currently in regard to her wound. She has been tolerating the dressing changes without complication. Fortunately I do not see any signs of active infection at this time. I think her wound is pretty much about completely healed. Electronic Signature(s) Signed: 11/20/2022 2:25:57 PM By: Allen Derry PA-C Entered By: Allen Derry on 11/20/2022 11:25:57 -------------------------------------------------------------------------------- Physical Exam Details Patient Name: Date of Service: Courtney [name], Courtney [name]. 11/20/2022 2:00 PM Medical Record Number: [id number] Patient Account Number: [id number] Date of Birth/Sex: Treating  RN: Sep 06, 1940 (82 y.o. [name] Primary Care Provider: Bethann Punches Other Clinician: TANAYAH, Courtney [name] ([id number]) [id number] Page 3 of 7 Referring Provider: Treating Provider/Extender: Margorie John in Treatment: 12 Constitutional Well-nourished and well-hydrated in no acute distress. Respiratory normal breathing without difficulty. Psychiatric this patient is able to make decisions and demonstrates good insight into disease process. Alert and Oriented x 3. pleasant and cooperative. Notes Patient's wound bed actually showed signs of excellent granulation epithelization at this point. Fortunately I do not see any signs of worsening overall and I believe that the patient is making good headway here towards closure. No fevers, chills, nausea, vomiting, or diarrhea. I really when looking into the wound do not see much open there may be [name] pinpoint area of that I think we just protected with Calmoseptine or something of the like this is doing quite well. Electronic Signature(s) Signed: 11/20/2022 2:26:40 PM By: Allen Derry PA-C Entered By: Allen Derry on 11/20/2022 11:26:40 -------------------------------------------------------------------------------- Physician Orders Details Patient Name: Date of Service: Courtney [name], Courtney [name]. 11/20/2022 2:00 PM  Medical Record Number: [id number] Patient Account Number: [id number] Date of Birth/Sex: Treating RN: [birth year] (82 y.o. 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 Bathing/ Shower/ Hygiene May shower; gently cleanse wound with antibacterial soap, rinse and pat dry prior to dressing wounds No tub bath. Anesthetic (Use 'Patient Medications' Section for Anesthetic Order Entry) Lidocaine applied to wound bed Wound Treatment Wound #2 - Breast Wound Laterality: Left Cleanser: Byram Ancillary Kit - 15 Day Supply (Generic) 1 x Per Day/15 Days Discharge Instructions: Use supplies as instructed; Kit contains: (15) Saline Bullets; (15) 3x3 Gauze; 15 pr Gloves Courtney [name], Courtney [name] ([id number]) [id number] Page 4 of 7 Cleanser: Soap and Water 1 x Per Day/15 Days Discharge Instructions: Gently cleanse wound with antibacterial soap, rinse and pat dry prior to dressing wounds Topical: calmoseptine 1 x Per Day/15 Days Prim Dressing: Gauze 1 x Per Day/15 Days ary Discharge Instructions: As directed: dry Electronic Signature(s) Signed: 11/20/2022 2:38:57 PM By: Angelina Pih Signed: 11/20/2022 4:31:01 PM By: Allen Derry PA-C Entered By: Angelina Pih on 11/20/2022 11:38:56 -------------------------------------------------------------------------------- Problem List Details Patient Name: Date of Service: Courtney Curd [name]. 11/20/2022 2:00 PM Medical Record Number: [id number] Patient Account Number: [id number] Date of Birth/Sex: Treating RN: 07-03-1940 (82 y.o. 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 11/20/2022 2:00 PM Medical Record Number: [id number] Patient Account Number: [id number] Date of Birth/Sex: Treating RN: 12-16-1940 (82 y.o. [name] Primary Care Provider: Bethann Punches Other Clinician: Referring Provider: Treating Provider/Extender: Margorie John in Treatment: 12 Subjective Chief Complaint Information obtained from Patient Left breast surgical ulcer History of Present Illness (HPI) 12-22-2021 upon evaluation today patient appears to be doing well currently in regard to her wound all things considered especially in light of the picture that they showed me from August when she initially had [name] skin tear. Fortunately there does not appear to be any signs of active infection locally nor systemically at this time which is great news and overall I am extremely pleased with the fact that she is making some progress here she has been placed  on Cipro due to [name] culture result that was done by rheumatologist which showed that she had  Pseudomonas and Staphylococcus aureus noted as the organisms in question. Fortunately I do not think that there is anything that seems to be still present or significant here I think she is actually doing much better which is great news. With that being said the patient tells me that she has been using Medihoney at this point which actually is really good does help to clear up [name] lot of this I do believe. In the beginning she was also placed on or rather in an Unna boot wrap based on what we are being told that was for [name] couple of weeks and then she did not have any additional follow-up with primary eventually rheumatology who were doing injections for her realize that she had [name] wound they stopped the injections which obviously would lower her immune response and healing chances. That is when she started on the antibiotics and subsequently also was referred to vascular. The good news is from [name] vascular standpoint she seems to be probably sufficient to heal with [name] right ABI of 1.02 and [name] left ABI of 1.02 as well. Her TBI on the right was 0.63 and on the left 0.54. She does have [name] history of diabetes mellitus type 2 and she has an A1c of 6.5 on 10-17-2021. Other than this she also does have chronic venous insufficiency her leg is swollen today I definitely think [name] compression wraps probably can help her she also has hypertension 12-29-2021 upon evaluation patient's wound is actually showing signs of excellent improvement. Fortunately I do not see any signs of infection I think she is making good progress here and overall I do believe that she is really looking quite nice as far as the overall appearance of the wound bed is concerned. There is [name] little bit of necrotic tissue noted on the surface of the wound that is going require debridement today. 12/28; continued improvement in the area on the left lateral leg which was initially [name] skin tear in the setting of chronic venous insufficiency. She has been  using Prisma with 3 layer compression. The length of the wound is down 0.5 cm 01-12-2022 upon evaluation today patient appears to be doing well currently in regard to her leg ulcer. She has been tolerating dressing changes without complication. Fortunately I do not see any signs of active infection locally nor systemically which is great news. No fevers, chills, nausea, vomiting, or diarrhea. 01-20-2022 upon evaluation today patient appears to be doing well currently in regard to her wound. In fact this is measuring significantly smaller and I am very pleased with where we stand. I do not see any signs of active infection locally or systemically at this time. No fevers, chills, nausea, vomiting, or diarrhea. 02-03-2022 upon evaluation today patient appears to be doing well currently in regard to her wound. In fact she appears to be completely healed based on what I am seeing. Fortunately I do not see any signs of active infection locally nor systemically which is great news. There is little piece of dried skin where the dressing had stuck once I remove this however there was nothing remaining underneath.' Readmission: 08-22-2022 this is an individual whom I am just seen actually at the beginning of the year although she healed this was in regard to her legs and her legs have continued to do quite well although she did have some issues with cellulitis but  that was taking care of recently she has not been wearing her compression socks I did have [name] brief conversation with her about the fact that she needs to make sure to wear these on [name] regular basis she voiced understanding. Unfortunately the patient around January which is when I was seeing her of this past year started to have [name] mass noted in her left breast. Subsequent that in December she had had some nipple drainage although she did not pay too much attention to it even with [name] mass noted. By March she had bloody drainage coming out of this and there  was the mass actually identified by way of I believe ultrasound at that point. They went in from [name] surgical standpoint on June 10 and remove the mass the good news is this was not cancer the unfortunate news that she is having [name] difficult time getting this to heal following. She has not been told to pack this although she tells me she was put [name] little bit of gauze in herself. Fortunately I do not see any signs of active infection at this point which is good news. She did request [name] referral back to [place] to try to help get this closed as we have done well with her leg. Patient's most recent hemoglobin A1c was on 07-26-2022 and was 7.1 this is doing pretty well at this point in my opinion. Otherwise her past medical history really has not changed significantly. 08-29-2022 upon evaluation today patient appears to be doing pretty well currently in regard to the wound currently on the left breast location. She has been tolerating the dressing changes without complication and fortunately this actually seems to be much less deep compared to last week's evaluation. I am extremely pleased with where we stand today and I do not see any signs of active infection at this time. Courtney [name], Courtney [name] ([id number]) [id number] Page 6 of 7 8/27; patient who had multiple at least 2 biopsies and procedures for [name] mass in the left breast. 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 Nevertheless she has been left with [name] nonhealing wound area just superior to the nipple area. We have been using Dakin's wet to dry. The patient complains of mild stinging at the orifice of the wound from the wet Dakin's wicked gauze 09-14-2022 upon evaluation today patient appears to be doing well with regard to the wound on her left breast. This actually appears to be very clean and I do believe is making excellent headway towards being completely closed. Fortunately I do not see  any signs of infection at this time. No fevers, chills, nausea, vomiting, or diarrhea. 09-28-2022 upon evaluation today patient appears to be doing decently well currently in regard to her wound. She tells me the [facility] did not do so great for her it caused [name] lot of bleeding and it is somewhat uncomfortable. For that reason she switched back to the Dakin's moistened gauze packing which seems to be doing much better for her. 10-16-2022 upon evaluation today patient's wound actually showed signs of excellent improvement actually very pleased with where we stand I do believe that the patient is making good headway towards complete closure. Fortunately I do not see any evidence of worsening overall and I believe that the patient is making good headway at this time. 10-30-2022 upon evaluation today patient appears to be doing well currently in regard to her wound which is actually showing signs of  improvement in fact this is getting very small to the point she cannot even pack into this anymore with the Dakin's moistened gauze. 11-06-2022 upon evaluation today patient appears to be doing well currently in regard to her wound which is actually showing signs of good improvement. Fortunately I do not see any signs of active infection at this time which is great news her wound is doing much better there is [name] little irritation of the skin but she thinks this may have been just some drainage. 11-20-2022 upon evaluation today patient appears to be doing well currently in regard to her wound. She has been tolerating the dressing changes without complication. Fortunately I do not see any signs of active infection at this time. I think her wound is pretty much about completely healed. Objective Constitutional Well-nourished and well-hydrated in no acute distress. Vitals Time Taken: 2:05 PM, Height: 67 in, Weight: 180 lbs, BMI: 28.2, Temperature: 97.8 F, Pulse: 75 bpm, Respiratory Rate: 18 breaths/min, Blood  Pressure: 115/95 mmHg. Respiratory normal breathing without difficulty. Psychiatric this patient is able to make decisions and demonstrates good insight into disease process. Alert and Oriented x 3. pleasant and cooperative. General Notes: Patient's wound bed actually showed signs of excellent granulation epithelization at this point. Fortunately I do not see any signs of worsening overall and I believe that the patient is making good headway here towards closure. No fevers, chills, nausea, vomiting, or diarrhea. I really when looking into the wound do not see much open there may be [name] pinpoint area of that I think we just protected with Calmoseptine or something of the like this is doing quite well. Integumentary (Hair, Skin) Wound #2 status is Open. Original cause of wound was Surgical Injury. The date acquired was: 06/19/2022. The wound has been in treatment 12 weeks. The wound is located on the Left Breast. The wound measures 0.1cm length x 0.2cm width x 0.4cm depth; 0.016cm^2 area and 0.006cm^3 volume. There is no tunneling or undermining noted. There is [name] medium amount of serosanguineous drainage noted. There is large (67-100%) pink granulation within the wound bed. There is no necrotic tissue within the wound bed. 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 I would recommend based on what we are seeing that we have the patient going to continue to monitor for any signs of infection or worsening. Based on what I am seeing I do believe that were making good headway here towards closure. 2. I am going to recommend as well  the patient should continue to monitor for any signs of infection or worsening. Courtney [name], Courtney [name] ([id number]) [id number] Page 7 of 7 3. I am going to recommend the Calmoseptine cream and we will see how things go if anything changes she will let me know but my hope is this will be completely healed by next week. We will see patient back for reevaluation in 1 week here in the clinic. If anything worsens or changes patient will contact our office for additional recommendations. Electronic Signature(s) Signed: 11/20/2022 2:27:19 PM By: Allen Derry PA-C Entered By: Allen Derry on 11/20/2022 11:27:19 -------------------------------------------------------------------------------- SuperBill Details Patient Name: Date of Service: Courtney Tamala Ser [name]. 11/20/2022 Medical Record Number: [id number] Patient Account Number: [id number]  Date of Birth/Sex: Treating RN: July 23, 1940 (82 y.o. 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 I73.89 Other specified peripheral vascular diseases I10 Essential (primary) hypertension Facility Procedures : CPT4 Code: 13086578 Description: 99213 - WOUND CARE VISIT-LEV 3 EST PT Modifier: Quantity: 1 Physician Procedures : CPT4 Code Description Modifier [id number] - WC PHYS LEVEL 3 - EST PT ICD-10 Diagnosis Description T81.31XA Disruption of external operation (surgical) wound, not elsewhere classified, initial encounter L98.492 Non-pressure  chronic ulcer of skin of  other sites with fat layer exposed E11.622 Type 2 diabetes mellitus with other skin ulcer I87.033 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

## 2022-11-21 LAB — VAS US ABI WITH/WO TBI

## 2022-11-22 ENCOUNTER — Encounter (INDEPENDENT_AMBULATORY_CARE_PROVIDER_SITE_OTHER): Payer: Self-pay | Admitting: Vascular Surgery

## 2022-11-22 NOTE — Progress Notes (Signed)
MRN : [id number]  Courtney [name] is a 82 y.o. (January 26, 1940) female who presents with chief complaint of check circulation.  History of Present Illness:   The patient returns to the office for followup regarding atherosclerotic changes of the lower extremities and review of the noninvasive studies.   There have been no interval changes in lower extremity symptoms. No interval shortening of the patient's claudication distance or development of rest pain symptoms. No new ulcers or wounds have occurred since the last visit.  There have been no significant changes to the patient's overall health care.  The patient denies amaurosis fugax or recent TIA symptoms. There are no documented recent neurological changes noted. There is no history of DVT, PE or superficial thrombophlebitis. The patient denies recent episodes of angina or shortness of breath.    Current Meds  Medication Sig   acetaminophen (TYLENOL) 500 MG tablet Take 500 mg by mouth every 6 (six) hours as needed (for pain.).    aspirin EC 81 MG tablet Take 81 mg by mouth daily.   bacitracin ointment Apply topically 2 (two) times daily.   bisoprolol (ZEBETA) 10 MG tablet Take 1 tablet (10 mg total) by mouth daily.   cetirizine (ZYRTEC) 10 MG tablet Take 10 mg by mouth at bedtime.    Cholecalciferol (D3) 25 MCG (1000 UT) capsule Take 1,000 Units by mouth daily.   famotidine (PEPCID) 40 MG tablet Take 40 mg by mouth daily.   gabapentin (NEURONTIN) 100 MG capsule Take 100 mg by mouth 2 (two) times daily.   inFLIXimab (REMICADE IV) Inject 500 mg into the vein every 6 (six) weeks.   insulin NPH Human (HUMULIN N,NOVOLIN N) 100 UNIT/ML injection Inject 0.4 mLs (40 Units total) into the skin daily before breakfast. (Patient taking differently: Inject 25 Units into the skin in the morning and at bedtime.)   melatonin 10 MG TABS Take 10 mg by mouth at bedtime. (Patient  taking differently: Take 10 mg by mouth as needed.)   montelukast (SINGULAIR) 10 MG tablet Take 10 mg by mouth daily.   pantoprazole (PROTONIX) 40 MG tablet Take 40 mg by mouth 2 (two) times daily.   polyethylene glycol (MIRALAX / GLYCOLAX) 17 g packet Take 17 g by mouth daily as needed for mild constipation.   rOPINIRole (REQUIP) 0.5 MG tablet Take 0.5 mg by mouth at bedtime as needed (RLS).   simvastatin (ZOCOR) 40 MG tablet Take 40 mg by mouth at bedtime.    Past Medical History:  Diagnosis Date   Arthritis    rheumatoid arthritis   B12 deficiency    Cancer (HCC)    skin cancer (squamous cell)   Collagen vascular disease (HCC)    RA   Dermatitis    Diabetes mellitus without complication (HCC)    type 2   Eczema    Fibrosis, pulmonary, interstitial, diffuse (HCC)    GERD (gastroesophageal reflux disease)    Headache    migraines in the past (none since menopause)   Heart murmur    History of hiatal hernia    Hives    "  chronic"   Hypertension    Loose stools    Osteoarthritis    lumbar spine, cervical spine, plantar fascitis    Pneumonia    PONV (postoperative nausea and vomiting)    Psoriasis    Rosacea    Sleep apnea 2004   does not use cpap   Urethral stenosis    w/ bladder polyps. followed by Dr Leonette Monarch    Urticaria    chronic    Varicose veins     Past Surgical History:  Procedure Laterality Date   ABDOMINAL AORTOGRAM W/LOWER EXTREMITY Left 07/25/2022   Procedure: ABDOMINAL AORTOGRAM W/LOWER EXTREMITY;  Surgeon: Renford Dills, MD;  Location: [facility];  Service: Cardiovascular;  Laterality: Left;   ANTERIOR CERVICAL DECOMP/DISCECTOMY FUSION N/A 08/20/2017   Procedure: Anterior Cervical Decompression Fusion - Cervical Four - Cervical Five - Cervical Five - Cervical Six;  Surgeon: Julio Sicks, MD;  Location: [facility] OR;  Service: Neurosurgery;  Laterality: N/A;  Anterior Cervical Decompression Fusion - Cervical Four - Cervical Five - Cervical Five -  Cervical Six   APPENDECTOMY     BILATERAL CARPAL TUNNEL RELEASE     BREAST BIOPSY Left 04/24/2022   [place] Bx, 6:00 heart clip, SUBAREOLAR BREAST TISSUE WITH RUPTURED DUCT ECTASIA.   BREAST BIOPSY Left 04/24/2022   [place] bx, 4:00 coil clip, SUBAREOLAR BREAST TISSUE WITH AREAS OF STROMAL FIBROSIS.   BREAST BIOPSY Left 04/24/2022   [place] LT BREAST BX W LOC DEV 1ST LESION IMG BX SPEC US GUIDE 04/24/2022 [facility]   BREAST BIOPSY Left 04/24/2022   [place] LT BREAST BX W LOC DEV EA ADD LESION IMG BX SPEC US GUIDE 04/24/2022 [facility]   BREAST BIOPSY WITH RADIO FREQUENCY LOCALIZER Left 06/19/2022   Procedure: BREAST BIOPSY WITH RADIO FREQUENCY LOCALIZER;  Surgeon: Carolan Shiver, MD;  Location: [facility] ORS;  Service: General;  Laterality: Left;   BREAST CYST ASPIRATION Left    neg   CATARACT EXTRACTION Bilateral    CHOLECYSTECTOMY  1963   COLONOSCOPY WITH PROPOFOL N/A 12/31/2015   Procedure: COLONOSCOPY WITH PROPOFOL;  Surgeon: Christena Deem, MD;  Location: [facility];  Service: Endoscopy;  Laterality: N/A;   ENDOMETRIAL ABLATION  1991   ESOPHAGOGASTRODUODENOSCOPY (EGD) WITH PROPOFOL N/A 12/31/2015   Procedure: ESOPHAGOGASTRODUODENOSCOPY (EGD) WITH PROPOFOL;  Surgeon: Christena Deem, MD;  Location: [facility] ENDOSCOPY;  Service: Endoscopy;  Laterality: N/A;   ESOPHAGOGASTRODUODENOSCOPY (EGD) WITH PROPOFOL N/A 06/02/2016   Procedure: ESOPHAGOGASTRODUODENOSCOPY (EGD) WITH PROPOFOL;  Surgeon: Christena Deem, MD;  Location: [facility] ENDOSCOPY;  Service: Endoscopy;  Laterality: N/A;   ESOPHAGOGASTRODUODENOSCOPY (EGD) WITH PROPOFOL N/A 11/07/2016   Procedure: ESOPHAGOGASTRODUODENOSCOPY (EGD) WITH PROPOFOL;  Surgeon: Christena Deem, MD;  Location: [facility] ENDOSCOPY;  Service: Endoscopy;  Laterality: N/A;   EYE SURGERY Bilateral 2006 and 2012   cataract surgery with lens implant   eyelid surgery Bilateral 2014   FOOT SURGERY Right    ligament and spurs   LUMBAR LAMINECTOMY/DECOMPRESSION  MICRODISCECTOMY Right 12/24/2013   Procedure: RIGHT LUMBAR THREE TO FOUR, LUMBAR FOUR TO FIVE, LUMBAR FIVE TO SACRAL ONE LAMINECTOMY/FORAMINOTOMY;  Surgeon: Karn Cassis, MD;  Location: [facility]O ORS;  Service: Neurosurgery;  Laterality: Right;  RIGHT L3-4 L4-5 L5-S1 LAMINECTOMY/FORAMINOTOMY   NASAL SEPTUM SURGERY  2004   SHOULDER SURGERY Right    for a frozen shoulder   SKIN CANCER EXCISION Right    leg x 4   skin cancer removal     TONSILLECTOMY     TUBAL LIGATION  2    Social History Social History   Tobacco Use   Smoking status: Never   Smokeless tobacco: Never  Vaping Use   Vaping status: Never Used  Substance Use Topics   Alcohol use: No   Drug use: No    Family History Family History  Problem Relation Age of Onset   COPD Mother    Arthritis/Rheumatoid Mother    Alzheimer's disease Father    Heart attack Father    Breast cancer Paternal Aunt 39    Allergies  Allergen Reactions   Methotrexate Derivatives Shortness Of Breath    Breathing difficulties    Acyclovir And Related Rash   Codeine Nausea Only   Contrast Media [Iodinated Contrast Media] Itching and Rash   Inderide [Propranolol-Hctz] Rash   Lodine [Etodolac] Rash   Metrizamide Itching and Rash   Procardia [Nifedipine] Rash     REVIEW OF SYSTEMS (Negative unless checked)  Constitutional: [] Weight loss  [] Fever  [] Chills Cardiac: [] Chest pain   [] Chest pressure   [] Palpitations   [] Shortness of breath when laying flat   [] Shortness of breath with exertion. Vascular:  [x] Pain in legs with walking   [] Pain in legs at rest  [] History of DVT   [] Phlebitis   [] Swelling in legs   [] Varicose veins   [] Non-healing ulcers Pulmonary:   [] Uses home oxygen   [] Productive cough   [] Hemoptysis   [] Wheeze  [] COPD   [] Asthma Neurologic:  [] Dizziness   [] Seizures   [] History of stroke   [] History of TIA  [] Aphasia   [] Vissual changes   [] Weakness or numbness in arm   [] Weakness or numbness in leg Musculoskeletal:    [] Joint swelling   [] Joint pain   [] Low back pain Hematologic:  [] Easy bruising  [] Easy bleeding   [] Hypercoagulable state   [] Anemic Gastrointestinal:  [] Diarrhea   [] Vomiting  [] Gastroesophageal reflux/heartburn   [] Difficulty swallowing. Genitourinary:  [] Chronic kidney disease   [] Difficult urination  [] Frequent urination   [] Blood in urine Skin:  [] Rashes   [] Ulcers  Psychological:  [] History of anxiety   []  History of major depression.  Physical Examination  Vitals:   11/20/22 1559  BP: (!) 140/69  Pulse: 74  Resp: 16  Weight: 176 lb 6.4 oz (80 kg)   Body mass index is 28.47 kg/m. Gen: WD/WN, NAD Head: [place]/AT, No temporalis wasting.  Ear/Nose/Throat: Hearing grossly intact, nares w/o erythema or drainage Eyes: PER, EOMI, sclera nonicteric.  Neck: Supple, no masses.  No bruit or JVD.  Pulmonary:  Good air movement, no audible wheezing, no use of accessory muscles.  Cardiac: RRR, normal S1, S2, no Murmurs. Vascular:  mild trophic changes, no open wounds Vessel Right Left  Radial Palpable Palpable  PT Not Palpable Not Palpable  DP Not Palpable Not Palpable  Gastrointestinal: soft, non-distended. No guarding/no peritoneal signs.  Musculoskeletal: M/S 5/5 throughout.  No visible deformity.  Neurologic: CN 2-12 intact. Pain and light touch intact in extremities.  Symmetrical.  Speech is fluent. Motor exam as listed above. Psychiatric: Judgment intact, Mood & affect appropriate for pt's clinical situation. Dermatologic: No rashes or ulcers noted.  No changes consistent with cellulitis.   CBC Lab Results  Component Value Date   WBC 5.1 07/27/2022   HGB 10.4 (L) 07/27/2022   HCT 31.5 (L) 07/27/2022   MCV 87.7 07/27/2022   PLT 93 (L) 07/27/2022    BMET    Component Value Date/Time   NA 139 07/27/2022 0335   K 4.1 07/27/2022 0335  CL 107 07/27/2022 0335   CO2 24 07/27/2022 0335   GLUCOSE 293 (H) 07/27/2022 0335   BUN 57 (H) 07/27/2022 0335   CREATININE 1.27 (H)  07/27/2022 0335   CALCIUM 9.1 07/27/2022 0335   GFRNONAA 42 (L) 07/27/2022 0335   GFRAA >60 08/13/2017 1430   CrCl cannot be calculated (Patient's most recent lab result is older than the maximum 21 days allowed.).  COAG Lab Results  Component Value Date   INR 1.22 12/25/2014    Radiology VAS [place] ABI WITH/WO TBI  Result Date: 11/21/2022  LOWER EXTREMITY DOPPLER STUDY Patient Name:  JAKITA WIRE  Date of Exam:   11/20/2022 Medical Rec #: [id number]        Accession #:    [id number] Date of Birth: May 06, 1940        Patient Gender: F Patient Age:   33 years Exam Location:  [facility] Procedure:      VAS [place] ABI WITH/WO TBI Referring Phys: [name] --------------------------------------------------------------------------------  Indications: Peripheral artery disease. High Risk Factors: Hypertension.  Comparison Study: 02/03/2022 Performing Technologist: Debbe Bales RVS  Examination Guidelines: A complete evaluation includes at minimum, Doppler waveform signals and systolic blood pressure reading at the level of bilateral brachial, anterior tibial, and posterior tibial arteries, when vessel segments are accessible. Bilateral testing is considered an integral part of a complete examination. Photoelectric Plethysmograph (PPG) waveforms and toe systolic pressure readings are included as required and additional duplex testing as needed. Limited examinations for reoccurring indications may be performed as noted.  ABI Findings: +---------+------------------+-----+--------+--------+ Right    Rt Pressure (mmHg)IndexWaveformComment  +---------+------------------+-----+--------+--------+ Brachial 225                                     +---------+------------------+-----+--------+--------+ ATA                             biphasicNC       +---------+------------------+-----+--------+--------+ PTA                             biphasicNC        +---------+------------------+-----+--------+--------+ Great Toe210               0.84 Normal           +---------+------------------+-----+--------+--------+ +---------+------------------+-----+--------+-------+ Left     Lt Pressure (mmHg)IndexWaveformComment +---------+------------------+-----+--------+-------+ Brachial 250                            [place]      +---------+------------------+-----+--------+-------+ ATA                                     [place]      +---------+------------------+-----+--------+-------+ PTA                                     [place]      +---------+------------------+-----+--------+-------+ Great Toe193               0.77 Normal          +---------+------------------+-----+--------+-------+ +-------+-----------+-----------+------------+------------+ ABI/TBIToday's ABIToday's TBIPrevious ABIPrevious TBI +-------+-----------+-----------+------------+------------+ Right  [place]         .  84        [place]          .53          +-------+-----------+-----------+------------+------------+ Left   [place]         .77        [place]          .49          +-------+-----------+-----------+------------+------------+  Bilateral TBIs appear increased compared to prior study on 02/03/2022. Bilateral ABIs appear essentially unchanged compared to prior study on 02/03/2022.  Summary: Right: Resting right ankle-brachial index indicates noncompressible right lower extremity arteries. The right toe-brachial index is normal. Brachial Blood Pressures Were excessively High ABI were not obtainable. Left: Resting left ankle-brachial index indicates noncompressible left lower extremity arteries. The left toe-brachial index is normal. Brachial Blood Pressures Were excessively High ABI were not obtainable.  *See table(s) above for measurements and observations.  Electronically signed by [name] MD on 11/21/2022 at 1:24:40 PM.    Final    CT CHEST WO CONTRAST  Result Date:  11/16/2022 CLINICAL DATA:  Follow-up pulmonary nodules EXAM: CT CHEST WITHOUT CONTRAST TECHNIQUE: Multidetector CT imaging of the chest was performed following the standard protocol without IV contrast. RADIATION DOSE REDUCTION: This exam was performed according to the departmental dose-optimization program which includes automated exposure control, adjustment of the mA and/or kV according to patient size and/or use of iterative reconstruction technique. COMPARISON:  CT chest dated 07/24/2022 FINDINGS: Cardiovascular: Heart is normal in size.  No pericardial effusion. No evidence of thoracic aortic aneurysm. Atherosclerotic calcifications of the aortic arch. Moderate sclerosis of the LAD and left circumflex. Mediastinum/Nodes: No suspicious mediastinal adenopathy. Visualized thyroid is unremarkable. Lungs/Pleura: Stable bilateral pulmonary nodules, including a dominant 11 mm cavitary nodule in the medial left lower lobe (series 3/image 88) and an 18 x 7 mm cavitary nodule in the medial right lower lobe (series 3/image 29). These are new from 2023. Given persistence from recent prior, infection is considered unlikely. The bilateral appearance does not favor primary bronchogenic carcinoma. Inflammatory process is favored over metastases. Given the history of rheumatoid arthritis, rheumatoid nodules are favored. Mild biapical pleural-parenchymal scarring. Mild centrilobular emphysematous changes, upper lung predominant. No focal consolidation. No pleural effusion or pneumothorax. Upper Abdomen: Visualized upper abdomen is notable for a tiny hiatal hernia, cirrhosis, and extensive vascular calcifications. Musculoskeletal: Degenerative changes of the visualized thoracolumbar spine. Cervical spine fixation hardware, incompletely visualized. IMPRESSION: Stable bilateral cavitary lower lobe pulmonary nodules, as above. Rheumatoid nodules are favored, with metastases considered unlikely. Cirrhosis. Aortic Atherosclerosis  (ICD10-I70.0) and Emphysema (ICD10-J43.9). Electronically Signed   By: Charline Bills M.D.   On: 11/16/2022 23:53     Assessment/Plan 1. PAD (peripheral artery disease) (HCC)  Recommend:  The patient has evidence of atherosclerosis of the lower extremities with claudication.  The patient does not voice lifestyle limiting changes at this point in time.  Noninvasive studies do not suggest clinically significant change.  No invasive studies, angiography or surgery at this time The patient should continue walking and begin a more formal exercise program.  The patient should continue antiplatelet therapy and aggressive treatment of the lipid abnormalities  No changes in the patient's medications at this time  Continued surveillance is indicated as atherosclerosis is likely to progress with time.    The patient will continue follow up with noninvasive studies as ordered.   2. Essential hypertension Continue antihypertensive medications as already ordered, these medications have been reviewed and there are no  changes at this time.  3. Mixed hyperlipidemia Continue statin as ordered and reviewed, no changes at this time  4. Spinal stenosis, lumbar region, with neurogenic claudication Continue medications to treat the patient's degenerative disease as already ordered, these medications have been reviewed and there are no changes at this time.  Continued activity and therapy was stressed.    [name], MD  11/22/2022 9:00 PM

## 2022-11-27 ENCOUNTER — Encounter: Payer: Medicare Other | Admitting: Physician Assistant

## 2022-11-27 DIAGNOSIS — E11622 Type 2 diabetes mellitus with other skin ulcer: Secondary | ICD-10-CM | POA: Diagnosis not present

## 2022-11-27 NOTE — Progress Notes (Addendum)
Courtney Courtney [name], Courtney Courtney [name] ([id number]) [id number] Page 1 of 7 Visit Report for 11/27/2022 Chief Complaint Document Details Patient Name: Date of Service: Courtney, [name] 11/27/2022 3:30 PM Medical Record Number: [id number] Patient Account Number: [id number] Date of Birth/Sex: Treating RN: 12/10/1940 (82 y.o. Courtney Courtney [name] Primary Care Provider: Bethann Courtney [name] Other Clinician: Referring Provider: Treating Provider/Extender: Courtney Courtney [name] in Treatment: 13 Information Obtained from: Patient Chief Complaint Left breast surgical ulcer Electronic Signature(s) Signed: 11/27/2022 3:39:51 PM By: Courtney Derry PA-C Entered By: Courtney Courtney [name] on 11/27/2022 15:39:51 -------------------------------------------------------------------------------- HPI Details Patient Name: Date of Service: Courtney Curd Courtney [name]. 11/27/2022 3:30 PM Medical Record Number: [id number] Patient Account Number: [id number] Date of Birth/Sex: Treating RN: 1940-07-18 (82 y.o. Courtney Courtney [name] Primary Care Provider: Bethann Courtney [name] Other Clinician: Referring Provider: Treating Provider/Extender: Courtney Courtney [name] in Treatment: 13 History of Present Illness HPI Description: 12-22-2021 upon evaluation today patient appears to be doing well currently in regard to her wound all things considered especially in light of the picture that they showed me from August when she initially had Courtney [name] skin tear. Fortunately there does not appear to be any signs of active infection locally nor systemically at this time which is great news and overall I am extremely pleased with the fact that she is making some progress here she has been placed on Cipro due to Courtney [name] culture result that was done by rheumatologist which showed that she had Pseudomonas and Staphylococcus aureus noted as the organisms in question. Fortunately I do not think that there is anything that seems to be still present or significant here I  think she is actually doing much better which is great news. With that being said the patient tells me that she has been using Medihoney at this point which actually is really good does help to clear up Courtney [name] lot of this I do believe. In the beginning she was also placed on or rather in an Unna boot wrap based on what we are being told that was for Courtney [name] couple of weeks and then she did not have any additional follow-up with primary eventually rheumatology who were doing injections for her realize that she had Courtney [name] wound they stopped the injections which obviously would lower her immune response and healing chances. That is when she started on the antibiotics and subsequently also was referred to vascular. The good news is from Courtney [name] vascular standpoint she seems to be probably sufficient to heal with Courtney [name] right ABI of 1.02 and Courtney [name] left ABI of 1.02 as well. Her TBI on the right was 0.63 and on the left 0.54. She does have Courtney [name] history of diabetes mellitus type 2 and she has an A1c of 6.5 on 10-17-2021. Other than this she also does have chronic venous insufficiency her leg is swollen today I definitely think Courtney [name] compression wraps probably can help her she also has hypertension 12-29-2021 upon evaluation patient's wound is actually showing signs of excellent improvement. Fortunately I do not see any signs of infection I think she is making good progress here and overall I do believe that she is really looking quite nice as far as the overall appearance of the wound bed is concerned. There EVI, POLLOK Courtney [name] ([id number]) [id number] Page 2 of 7 is Courtney [name] little bit of necrotic tissue noted on the surface of the wound that is going require debridement today. 12/28; continued improvement in the area on the left lateral leg which was initially Courtney [name] skin tear in the  setting of chronic venous insufficiency. She has been using Prisma with 3 layer compression. The length of the wound is down 0.5 cm 01-12-2022 upon  evaluation today patient appears to be doing well currently in regard to her leg ulcer. She has been tolerating dressing changes without complication. Fortunately I do not see any signs of active infection locally nor systemically which is great news. No fevers, chills, nausea, vomiting, or diarrhea. 01-20-2022 upon evaluation today patient appears to be doing well currently in regard to her wound. In fact this is measuring significantly smaller and I am very pleased with where we stand. I do not see any signs of active infection locally or systemically at this time. No fevers, chills, nausea, vomiting, or diarrhea. 02-03-2022 upon evaluation today patient appears to be doing well currently in regard to her wound. In fact she appears to be completely healed based on what I am seeing. Fortunately I do not see any signs of active infection locally nor systemically which is great news. There is little piece of dried skin where the dressing had stuck once I remove this however there was nothing remaining underneath.' Readmission: 08-22-2022 this is an individual whom I am just seen actually at the beginning of the year although she healed this was in regard to her legs and her legs have continued to do quite well although she did have some issues with cellulitis but that was taking care of recently she has not been wearing her compression socks I did have Courtney [name] brief conversation with her about the fact that she needs to make sure to wear these on Courtney [name] regular basis she voiced understanding. Unfortunately the patient around January which is when I was seeing her of this past year started to have Courtney [name] mass noted in her left breast. Subsequent that in December she had had some nipple drainage although she did not pay too much attention to it even with Courtney [name] mass noted. By March she had bloody drainage coming out of this and there was the mass actually identified by way of I believe ultrasound at that point. They went in from  Courtney [name] surgical standpoint on June 10 and remove the mass the good news is this was not cancer the unfortunate news that she is having Courtney [name] difficult time getting this to heal following. She has not been told to pack this although she tells me she was put Courtney [name] little bit of gauze in herself. Fortunately I do not see any signs of active infection at this point which is good news. She did request Courtney [name] referral back to [place] to try to help get this closed as we have done well with her leg. Patient's most recent hemoglobin A1c was on 07-26-2022 and was 7.1 this is doing pretty well at this point in my opinion. Otherwise her past medical history really has not changed significantly. 08-29-2022 upon evaluation today patient appears to be doing pretty well currently in regard to the wound currently on the left breast location. She has been tolerating the dressing changes without complication and fortunately this actually seems to be much less deep compared to last week's evaluation. I am extremely pleased with where we stand today and I do not see any signs of active infection at this time. 8/27; patient who had multiple at least 2 biopsies and procedures for Courtney [name] mass in the left breast. 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 Nevertheless she has been left with  Courtney [name] nonhealing wound area just superior to the nipple area. We have been using Dakin's wet to dry. The patient complains of mild stinging at the orifice of the wound from the wet Dakin's wicked gauze 09-14-2022 upon evaluation today patient appears to be doing well with regard to the wound on her left breast. This actually appears to be very clean and I do believe is making excellent headway towards being completely closed. Fortunately I do not see any signs of infection at this time. No fevers, chills, nausea, vomiting, or diarrhea. 09-28-2022 upon evaluation today patient appears to be doing decently well currently in  regard to her wound. She tells me the [facility] did not do so great for her it caused Courtney [name] lot of bleeding and it is somewhat uncomfortable. For that reason she switched back to the Dakin's moistened gauze packing which seems to be doing much better for her. 10-16-2022 upon evaluation today patient's wound actually showed signs of excellent improvement actually very pleased with where we stand I do believe that the patient is making good headway towards complete closure. Fortunately I do not see any evidence of worsening overall and I believe that the patient is making good headway at this time. 10-30-2022 upon evaluation today patient appears to be doing well currently in regard to her wound which is actually showing signs of improvement in fact this is getting very small to the point she cannot even pack into this anymore with the Dakin's moistened gauze. 11-06-2022 upon evaluation today patient appears to be doing well currently in regard to her wound which is actually showing signs of good improvement. Fortunately I do not see any signs of active infection at this time which is great news her wound is doing much better there is Courtney [name] little irritation of the skin but she thinks this may have been just some drainage. 11-20-2022 upon evaluation today patient appears to be doing well currently in regard to her wound. She has been tolerating the dressing changes without complication. Fortunately I do not see any signs of active infection at this time. I think her wound is pretty much about completely healed. 11-27-2022 upon evaluation today patient appears to be doing well currently in regard to her wound which is actually showing signs of complete epithelization. I do not see any evidence of worsening overall and I feel like the patient has done extremely well. Electronic Signature(s) Signed: 11/27/2022 4:27:03 PM By: Courtney Derry PA-C Entered By: Courtney Courtney [name] on 11/27/2022  16:27:03 -------------------------------------------------------------------------------- Physical Exam Details Patient Name: Date of Service: STA Tamala Ser Courtney [name]. 11/27/2022 3:30 PM Medical Record Number: [id number] Patient Account Number: [id number] Courtney Courtney [name], Courtney Courtney [name] ([id number]) [id number] Page 3 of 7 Date of Birth/Sex: Treating RN: [birth year] (82 y.o. Courtney Courtney [name] Primary Care Provider: Other Clinician: Bethann Courtney [name] Referring Provider: Treating Provider/Extender: Courtney Courtney [name] in Treatment: 13 Constitutional Well-nourished and well-hydrated in no acute distress. Respiratory normal breathing without difficulty. Psychiatric this patient is able to make decisions and demonstrates good insight into disease process. Alert and Oriented x 3. pleasant and cooperative. Notes Upon inspection patient's wound bed actually showed signs of good granulation and epithelization at this point. Fortunately I see no signs of active infection and I believe that she is really doing excellent as far as the healing is concerned this appears to be completely closed which is great news. Electronic Signature(s) Signed: 11/27/2022 4:27:29 PM By: Courtney Derry PA-C Entered By: Courtney Courtney [name] on 11/27/2022 16:27:29 -------------------------------------------------------------------------------- Physician Orders  Details Patient Name: Date of Service: Courtney Courtney [name], Courtney Courtney [name] 11/27/2022 3:30 PM Medical Record Number: [id number] Patient Account Number: [id number] Date of Birth/Sex: Treating RN: 07/27/1940 (82 y.o. Courtney Courtney [name] Primary Care Provider: Bethann Courtney [name] Other Clinician: Referring Provider: Treating Provider/Extender: Courtney Courtney [name] in Treatment: 13 The following information was scribed by: Midge Aver The information was scribed for: Courtney Courtney [name] Verbal / Phone Orders: No Diagnosis Coding ICD-10 Coding Code Description T81.31XA Disruption of  external operation (surgical) wound, not elsewhere classified, initial encounter L98.492 Non-pressure chronic ulcer of skin of other sites with fat layer exposed E11.622 Type 2 diabetes mellitus with other skin ulcer 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 Electronic Signature(s) Signed: 11/28/2022 4:00:09 PM By: Courtney Derry PA-C Signed: 12/01/2022 12:46:12 PM By: Midge Aver MSN RN CNS WTA Entered By: Midge Aver on 11/27/2022 15:53:45 Courtney Courtney [name] ([id number]) [id number] Page 4 of 7 -------------------------------------------------------------------------------- Problem List Details Patient Name: Date of Service: Courtney Courtney [name], Courtney Courtney [name] 11/27/2022 3:30 PM Medical Record Number: [id number] Patient Account Number: [id number] Date of Birth/Sex: Treating RN: [birth year] (82 y.o. 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 08/22/2022 No Yes extremity I73.89 Other specified peripheral vascular diseases 08/22/2022 No Yes I10 Essential (primary) hypertension 08/22/2022 No Yes Inactive Problems Resolved Problems Electronic Signature(s) Signed:  11/27/2022 3:39:42 PM By: Courtney Derry PA-C Entered By: Courtney Courtney [name] on 11/27/2022 15:39:42 Gotsch, Nakyra Courtney [name] ([id number]) [id number] Page 5 of 7 -------------------------------------------------------------------------------- Progress Note Details Patient Name: Date of Service: Courtney Courtney [name], Courtney Courtney [name] 11/27/2022 3:30 PM Medical Record Number: [id number] Patient Account Number: [id number] Date of Birth/Sex: Treating RN: 1941-01-09 (82 y.o. Courtney Courtney [name] Primary Care Provider: Bethann Courtney [name] Other Clinician: Referring Provider: Treating Provider/Extender: Courtney Courtney [name] in Treatment: 13 Subjective Chief Complaint Information obtained from Patient Left breast surgical ulcer History of Present Illness (HPI) 12-22-2021 upon evaluation today patient appears to be doing well currently in regard to her wound all things considered especially in light of the picture that they showed me from August when she initially had Courtney [name] skin tear. Fortunately there does not appear to be any signs of active infection locally nor systemically at this time which is great news and overall I am extremely pleased with the fact that she is making some progress here she has been placed on Cipro due to Courtney [name] culture result that was done by rheumatologist which showed that she had Pseudomonas and Staphylococcus aureus noted as the organisms in question. Fortunately I do not think that there is anything that seems to be still present or significant here I think she is actually doing much better which is great news. With that being said the patient tells me that she has been using Medihoney at this point which actually is really good does help to clear up Courtney [name] lot of this I do believe. In the  beginning she was also placed on or rather in an Unna boot wrap based on what we are being told that was for Courtney [name] couple of weeks and then she did not have any additional follow-up with primary eventually  rheumatology who were doing injections for her realize that she had Courtney [name] wound they stopped the injections which obviously would lower her immune response and healing chances. That is when she started on the antibiotics and subsequently also was referred to vascular. The good news is from Courtney [name] vascular standpoint she seems to be probably sufficient to heal with Courtney [name] right ABI of 1.02 and Courtney [name] left ABI of 1.02 as well. Her TBI on the right was 0.63 and on the left 0.54. She does have Courtney [name] history of diabetes mellitus type 2 and she has an A1c of 6.5 on 10-17-2021. Other than this she also does have chronic venous insufficiency her leg is swollen today I definitely think Courtney [name] compression wraps probably can help her she also has hypertension 12-29-2021 upon evaluation patient's wound is actually showing signs of excellent improvement. Fortunately I do not see any signs of infection I think she is making good progress here and overall I do believe that she is really looking quite nice as far as the overall appearance of the wound bed is concerned. There is Courtney [name] little bit of necrotic tissue noted on the surface of the wound that is going require debridement today. 12/28; continued improvement in the area on the left lateral leg which was initially Courtney [name] skin tear in the setting of chronic venous insufficiency. She has been using Prisma with 3 layer compression. The length of the wound is down 0.5 cm 01-12-2022 upon evaluation today patient appears to be doing well currently in regard to her leg ulcer. She has been tolerating dressing changes without complication. Fortunately I do not see any signs of active infection locally nor systemically which is great news. No fevers, chills, nausea, vomiting, or diarrhea. 01-20-2022 upon evaluation today patient appears to be doing well currently in regard to her wound. In fact this is measuring significantly smaller and I am very pleased with where we stand. I do not see any signs of active  infection locally or systemically at this time. No fevers, chills, nausea, vomiting, or diarrhea. 02-03-2022 upon evaluation today patient appears to be doing well currently in regard to her wound. In fact she appears to be completely healed based on what I am seeing. Fortunately I do not see any signs of active infection locally nor systemically which is great news. There is little piece of dried skin where the dressing had stuck once I remove this however there was nothing remaining underneath.' Readmission: 08-22-2022 this is an individual whom I am just seen actually at the beginning of the year although she healed this was in regard to her legs and her legs have continued to do quite well although she did have some issues with cellulitis but that was taking care of recently she has not been wearing her compression socks I did have Courtney [name] brief conversation with her about the fact that she needs to make sure to wear these on Courtney [name] regular basis she voiced understanding. Unfortunately the patient around January which is when I was seeing her of this past year started to have Courtney [name] mass noted in her left breast. Subsequent that in December she had had some nipple drainage although she did not pay too much attention to it even with Courtney [name] mass noted. By  March she had bloody drainage coming out of this and there was the mass actually identified by way of I believe ultrasound at that point. They went in from Courtney [name] surgical standpoint on June 10 and remove the mass the good news is this was not cancer the unfortunate news that she is having Courtney [name] difficult time getting this to heal following. She has not been told to pack this although she tells me she was put Courtney [name] little bit of gauze in herself. Fortunately I do not see any signs of active infection at this point which is good news. She did request Courtney [name] referral back to [place] to try to help get this closed as we have done well with her leg. Patient's most recent hemoglobin A1c was on  07-26-2022 and was 7.1 this is doing pretty well at this point in my opinion. Otherwise her past medical history really has not changed significantly. 08-29-2022 upon evaluation today patient appears to be doing pretty well currently in regard to the wound currently on the left breast location. She has been tolerating the dressing changes without complication and fortunately this actually seems to be much less deep compared to last week's evaluation. I am extremely pleased with where we stand today and I do not see any signs of active infection at this time. 8/27; patient who had multiple at least 2 biopsies and procedures for Courtney [name] mass in the left breast. 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 Nevertheless she has been left with Courtney [name] nonhealing wound area just superior to the nipple area. We have been using Dakin's wet to dry. The patient complains of mild stinging at the orifice of the wound from the wet Dakin's wicked gauze 09-14-2022 upon evaluation today patient appears to be doing well with regard to the wound on her left breast. This actually appears to be very clean and I do believe is making excellent headway towards being completely closed. Fortunately I do not see any signs of infection at this time. No fevers, chills, nausea, vomiting, or diarrhea. 09-28-2022 upon evaluation today patient appears to be doing decently well currently in regard to her wound. She tells me the [facility] did not do so great for her it caused Courtney [name] lot of bleeding and it is somewhat uncomfortable. For that reason she switched back to the Dakin's moistened gauze packing which seems to be doing much better for her. 10-16-2022 upon evaluation today patient's wound actually showed signs of excellent improvement actually very pleased with where we stand I do believe that the patient is making good headway towards complete closure. Fortunately I do not see any evidence of  worsening overall and I believe that the patient is making good headway at this time. 10-30-2022 upon evaluation today patient appears to be doing well currently in regard to her wound which is actually showing signs of improvement in fact this is getting very small to the point she cannot even pack into this anymore with the Dakin's moistened gauze. Courtney Courtney [name], Courtney Courtney [name] Courtney [name] ([id number]) [id number] Page 6 of 7 11-06-2022 upon evaluation today patient appears to be doing well currently in regard to her wound which is actually showing signs of good improvement. Fortunately I do not see any signs of active infection at this time which is great news her wound is doing much better there is Courtney [name] little irritation of the skin but she thinks this may have been just some drainage. 11-20-2022 upon evaluation today patient  appears to be doing well currently in regard to her wound. She has been tolerating the dressing changes without complication. Fortunately I do not see any signs of active infection at this time. I think her wound is pretty much about completely healed. 11-27-2022 upon evaluation today patient appears to be doing well currently in regard to her wound which is actually showing signs of complete epithelization. I do not see any evidence of worsening overall and I feel like the patient has done extremely well. Objective Constitutional Well-nourished and well-hydrated in no acute distress. Vitals Time Taken: 3:32 PM, Height: 67 in, Weight: 180 lbs, BMI: 28.2, Temperature: 97.7 F, Pulse: 71 bpm, Respiratory Rate: 16 breaths/min, Blood Pressure: 113/46 mmHg. Respiratory normal breathing without difficulty. Psychiatric this patient is able to make decisions and demonstrates good insight into disease process. Alert and Oriented x 3. pleasant and cooperative. General Notes: Upon inspection patient's wound bed actually showed signs of good granulation and epithelization at this  point. Fortunately I see no signs of active infection and I believe that she is really doing excellent as far as the healing is concerned this appears to be completely closed which is great news. Integumentary (Hair, Skin) Wound #2 status is Healed - Epithelialized. Original cause of wound was Surgical Injury. The date acquired was: 06/19/2022. The wound has been in treatment 13 weeks. The wound is located on the Left Breast. The wound measures 0cm length x 0cm width x 0cm depth; 0cm^2 area and 0cm^3 volume. There is Courtney [name] medium amount of serosanguineous drainage noted. There is large (67-100%) pink granulation within the wound bed. There is no necrotic tissue within the wound bed. 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 1. I am going to recommend that the patient should continue to use Courtney [name] little bit of the Calmoseptine cream that seems to have done extremely well for her and I think that doing that for another 1 to 2 weeks should be perfect otherwise she can wash and bathe as normal with no complications or concerns. 2. I am going to recommend as well she should continue to monitor for any signs of infection or worsening if anything changes she knows contact the office and let me know. We will see patient back for reevaluation in 1 week here in the clinic. If anything worsens or changes patient will contact our office for additional recommendations. Electronic Signature(s) Signed: 11/27/2022 4:27:50 PM By:  Courtney Derry PA-C Entered By: Courtney Courtney [name] on 11/27/2022 16:27:50 Courtney Courtney [name] ([id number]) [id number] Page 7 of 7 -------------------------------------------------------------------------------- SuperBill Details Patient Name: Date of Service: Courtney Courtney [name], Courtney Courtney [name] 11/27/2022 Medical Record Number: [id number] Patient Account Number: [id number] Date of Birth/Sex: Treating RN: [birth year] (82 y.o. Courtney Courtney [name] Primary Care Provider: Bethann Courtney [name] Other Clinician: Referring Provider: Treating Provider/Extender: Courtney Courtney [name] in Treatment: 13 Diagnosis Coding ICD-10 Codes Code Description T81.31XA Disruption of external operation (surgical) wound, not elsewhere classified, initial encounter L98.492 Non-pressure chronic ulcer of skin of other sites with fat layer exposed E11.622 Type 2 diabetes mellitus with other skin ulcer I87.033 Postthrombotic syndrome with ulcer and inflammation of  bilateral lower extremity I73.89 Other specified peripheral vascular diseases I10 Essential (primary) hypertension Facility Procedures : CPT4 Code: 16109604 Description: 99213 - WOUND CARE VISIT-LEV 3 EST PT Modifier: Quantity: 1 Physician Procedures : CPT4 Code Description Modifier [id number] 99213 - WC PHYS LEVEL 3 - EST PT ICD-10 Diagnosis Description T81.31XA Disruption of external operation (surgical) wound, not elsewhere classified, initial encounter L98.492 Non-pressure chronic ulcer of skin of  other sites with fat layer exposed E11.622 Type 2 diabetes mellitus with other skin ulcer I87.033 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

## 2022-12-01 NOTE — Progress Notes (Signed)
Courtney [name] ([id number]) [id number] Page 1 of 8 Visit Report for 11/27/2022 Arrival Information Details Patient Name: Date of Service: Courtney [name] 11/27/2022 3:30 PM Medical Record Number: [id number] Patient Account Number: [id number] Date of Birth/Sex: Treating RN: 1940-02-12 (82 y.o. Ginette Pitman Primary Care Tiffany Talarico: Bethann Punches Other Clinician: Referring Meagan Spease: Treating Ashaki Frosch/Extender: Margorie John in Treatment: 13 Visit Information History Since Last Visit Added or deleted any medications: No Patient Arrived: Cane Any new allergies or adverse reactions: No Arrival Time: 15:28 Has Dressing in Place as Prescribed: Yes Accompanied By: husband Pain Present Now: No Transfer Assistance: None Patient Identification Verified: Yes Secondary Verification Process Completed: Yes Patient Requires Transmission-Based No Precautions: Patient Has Alerts: Yes Patient Alerts: Patient on Blood Thinner Left breast mass removed Electronic Signature(s) Signed: 12/01/2022 12:46:12 PM By: Midge Aver MSN RN CNS WTA Entered By: Midge Aver on 11/27/2022 15:32:00 -------------------------------------------------------------------------------- Clinic Level of Care Assessment Details Patient Name: Date of Service: Courtney [name] 11/27/2022 3:30 PM Medical Record Number: [id number] Patient Account Number: [id number] Date of Birth/Sex: Treating RN: October 08, 1940 (82 y.o. Ginette Pitman Primary Care Yana Schorr: Bethann Punches Other Clinician: Referring Brand Siever: Treating Lyall Faciane/Extender: Margorie John in Treatment: 13 Clinic Level of Care Assessment Items TOOL 4 Quantity Score X- 1 0 Use when only an EandM is performed on FOLLOW-UP visit ASSESSMENTS - Nursing Assessment / Reassessment X- 1 10 Reassessment of Co-morbidities (includes updates in patient status) X- 1 5 Reassessment of Adherence to Treatment  Plan ASSESSMENTS - Wound and Skin Assessment / Reassessment Courtney [name] ([id number]) [id number] Page 2 of 8 X- 1 5 Simple Wound Assessment / Reassessment - one wound []  - 0 Complex Wound Assessment / Reassessment - multiple wounds []  - 0 Dermatologic / Skin Assessment (not related to wound area) ASSESSMENTS - Focused Assessment []  - 0 Circumferential Edema Measurements - multi extremities []  - 0 Nutritional Assessment / Counseling / Intervention []  - 0 Lower Extremity Assessment (monofilament, tuning fork, pulses) []  - 0 Peripheral Arterial Disease Assessment (using hand held doppler) ASSESSMENTS - Ostomy and/or Continence Assessment and Care []  - 0 Incontinence Assessment and Management []  - 0 Ostomy Care Assessment and Management (repouching, etc.) PROCESS - Coordination of Care X - Simple Patient / Family Education for ongoing care 1 15 []  - 0 Complex (extensive) Patient / Family Education for ongoing care X- 1 10 Staff obtains Chiropractor, Records, T Results / Process Orders est []  - 0 Staff telephones HHA, Nursing Homes / Clarify orders / etc []  - 0 Routine Transfer to another Facility (non-emergent condition) []  - 0 Routine Hospital Admission (non-emergent condition) []  - 0 New Admissions / Manufacturing engineer / Ordering NPWT Apligraf, etc. , []  - 0 Emergency Hospital Admission (emergent condition) X- 1 10 Simple Discharge Coordination []  - 0 Complex (extensive) Discharge Coordination PROCESS - Special Needs []  - 0 Pediatric / Minor Patient Management []  - 0 Isolation Patient Management []  - 0 Hearing / Language / Visual special needs []  - 0 Assessment of Community assistance (transportation, D/C planning, etc.) []  - 0 Additional assistance / Altered mentation []  - 0 Support Surface(s) Assessment (bed, cushion, seat, etc.) INTERVENTIONS - Wound Cleansing / Measurement X - Simple Wound Cleansing - one wound 1 5 []  -  0 Complex Wound Cleansing - multiple wounds X- 1 5 Wound Imaging (photographs - any number of wounds) []  - 0 Wound Tracing (instead of photographs) X- 1 5 Simple Wound Measurement - one  wound []  - 0 Complex Wound Measurement - multiple wounds INTERVENTIONS - Wound Dressings X - Small Wound Dressing one or multiple wounds 1 10 []  - 0 Medium Wound Dressing one or multiple wounds []  - 0 Large Wound Dressing one or multiple wounds []  - 0 Application of Medications - topical []  - 0 Application of Medications - injection INTERVENTIONS - Miscellaneous []  - 0 External ear exam []  - 0 Specimen Collection (cultures, biopsies, blood, body fluids, etc.) Courtney [name] ([id number]) [id number] Page 3 of 8 []  - 0 Specimen(s) / Culture(s) sent or taken to Lab for analysis []  - 0 Patient Transfer (multiple staff / Michiel Sites Lift / Similar devices) []  - 0 Simple Staple / Suture removal (25 or less) []  - 0 Complex Staple / Suture removal (26 or more) []  - 0 Hypo / Hyperglycemic Management (close monitor of Blood Glucose) []  - 0 Ankle / Brachial Index (ABI) - do not check if billed separately X- 1 5 Vital Signs Has the patient been seen at the hospital within the last three years: Yes Total Score: 85 Level Of Care: New/Established - Level 3 Electronic Signature(s) Signed: 12/01/2022 12:46:12 PM By: Midge Aver MSN RN CNS WTA Entered By: Midge Aver on 11/27/2022 15:54:14 -------------------------------------------------------------------------------- Encounter Discharge Information Details Patient Name: Date of Service: Courtney Curd [name]. 11/27/2022 3:30 PM Medical Record Number: [id number] Patient Account Number: [id number] Date of Birth/Sex: Treating RN: 1940-07-19 (82 y.o. Ginette Pitman Primary Care Laster Appling: Bethann Punches Other Clinician: Referring Amarissa Koerner: Treating Skye Rodarte/Extender: Margorie John in Treatment: 13 Encounter  Discharge Information Items Discharge Condition: Stable Ambulatory Status: Cane Discharge Destination: Home Transportation: Private Auto Accompanied By: husband Schedule Follow-up Appointment: No Clinical Summary of Care: Electronic Signature(s) Signed: 12/01/2022 12:46:12 PM By: Midge Aver MSN RN CNS WTA Entered By: Midge Aver on 11/27/2022 15:55:21 -------------------------------------------------------------------------------- Lower Extremity Assessment Details Patient Name: Date of Service: Courtney Curd [name]. 11/27/2022 3:30 PM Medical Record Number: [id number] Patient Account Number: [id number] KENNIE, RUPAR [name] ([id number]) [id number] Page 4 of 8 Date of Birth/Sex: Treating RN: Feb 27, 1940 (82 y.o. Ginette Pitman Primary Care Asheton Viramontes: Other Clinician: Bethann Punches Referring Lunabelle Oatley: Treating Anjana Cheek/Extender: Margorie John in Treatment: 13 Electronic Signature(s) Signed: 12/01/2022 12:46:12 PM By: Midge Aver MSN RN CNS WTA Entered By: Midge Aver on 11/27/2022 15:50:42 -------------------------------------------------------------------------------- Multi Wound Chart Details Patient Name: Date of Service: Courtney Curd [name]. 11/27/2022 3:30 PM Medical Record Number: [id number] Patient Account Number: [id number] Date of Birth/Sex: Treating RN: Nov 26, 1940 (82 y.o. Ginette Pitman Primary Care Quindell Shere: Bethann Punches Other Clinician: Referring Khaila Velarde: Treating Lajean Boese/Extender: Margorie John in Treatment: 13 Vital Signs Height(in): 67 Pulse(bpm): 71 Weight(lbs): 180 Blood Pressure(mmHg): 113/46 Body Mass Index(BMI): 28.2 Temperature(F): 97.7 Respiratory Rate(breaths/min): 16 [2:Photos:] [N/[name]:N/[name]] Left Breast N/[name] N/[name] Wound Location: Surgical Injury N/[name] N/[name] Wounding Event: Open Surgical Wound N/[name] N/[name] Primary Etiology: Sleep Apnea, Hypertension, Peripheral N/[name] N/[name] Comorbid History: Arterial  Disease, Type II Diabetes, Rheumatoid Arthritis, Osteoarthritis, Neuropathy 06/19/2022 N/[name] N/[name] Date Acquired: 13 N/[name] N/[name] Weeks of Treatment: Open N/[name] N/[name] Wound Status: No N/[name] N/[name] Wound Recurrence: 0.1x0.1x0.1 N/[name] N/[name] Measurements L x W x D (cm) 0.008 N/[name] N/[name] [name] (cm) : rea 0.001 N/[name] N/[name] Volume (cm) : 99.60% N/[name] N/[name] % Reduction in Area: 100.00% N/[name] N/[name] % Reduction in Volume: Full Thickness With Exposed Support N/[name] N/[name] Classification: Structures Medium N/[name] N/[name] Exudate [name] mount: Serosanguineous N/[name] N/[name] Exudate Type: red, brown N/[name] N/[name] Exudate Color: Large (  67-100%) N/[name] N/[name] Granulation [name] mount: Pink N/[name] N/[name] Granulation Quality: None Present (0%) N/[name] N/[name] Necrotic [name] mount: Fat Layer (Subcutaneous Tissue): No N/[name] N/[name] Exposed Structures: Small (1-33%) N/[name] N/[name] EpithelializationMYA, REUM [name] ([id number]) [id number] Page 5 of 8 Treatment Notes Electronic Signature(s) Signed: 12/01/2022 12:46:12 PM By: Midge Aver MSN RN CNS WTA Entered By: Midge Aver on 11/27/2022 15:36:24 -------------------------------------------------------------------------------- Multi-Disciplinary Care Plan Details Patient Name: Date of Service: Courtney Curd [name]. 11/27/2022 3:30 PM Medical Record Number: [id number] Patient Account Number: [id number] Date of Birth/Sex: Treating RN: [birth year] (82 y.o. Ginette Pitman Primary Care Augusto Deckman: Bethann Punches Other Clinician: Referring Dreyden Rohrman: Treating Toryn Dewalt/Extender: Margorie John in Treatment: 13 Active Inactive Electronic Signature(s) Signed: 12/01/2022 12:46:12 PM By: Midge Aver MSN RN CNS WTA Entered By: Midge Aver on 11/27/2022 15:54:30 -------------------------------------------------------------------------------- Pain Assessment Details Patient Name: Date of Service: Courtney Curd [name]. 11/27/2022 3:30 PM Medical Record Number: [id number] Patient Account Number: [id number] Date of  Birth/Sex: Treating RN: February 28, 1940 (82 y.o. Ginette Pitman Primary Care Rowyn Spilde: Bethann Punches Other Clinician: Referring Virdia Ziesmer: Treating Day Greb/Extender: Margorie John in Treatment: 13 Active Problems Location of Pain Severity and Description of Pain Patient Has Paino No Site Locations [name] ([id number]) [id number] Page 6 of 8 Pain Management and Medication Current Pain Management: Electronic Signature(s) Signed: 12/01/2022 12:46:12 PM By: Midge Aver MSN RN CNS WTA Entered By: Midge Aver on 11/27/2022 15:32:31 -------------------------------------------------------------------------------- Patient/Caregiver Education Details Patient Name: Date of Service: Annamaria Boots 11/18/2024andnbsp3:30 PM Medical Record Number: [id number] Patient Account Number: [id number] Date of Birth/Gender: Treating RN: Feb 20, 1940 (82 y.o. Ginette Pitman Primary Care Physician: Bethann Punches Other Clinician: Referring Physician: Treating Physician/Extender: Margorie John in Treatment: 13 Education Assessment Education Provided To: Patient Education Topics Provided Discharge Packet: Methods: Explain/Verbal Responses: State content correctly Electronic Signature(s) Signed: 12/01/2022 12:46:12 PM By: Midge Aver MSN RN CNS WTA Entered By: Midge Aver on 11/27/2022 15:54:45 [name] ([id number]) [id number] Page 7 of 8 -------------------------------------------------------------------------------- Wound Assessment Details Patient Name: Date of Service: ANNITA, [place] 11/27/2022 3:30 PM Medical Record Number: [id number] Patient Account Number: [id number] Date of Birth/Sex: Treating RN: 08-16-1940 (82 y.o. Ginette Pitman Primary Care Dalen [place]: Bethann Punches Other Clinician: Referring Jenisis Harmsen: Treating Bee Hammerschmidt/Extender: Margorie John in Treatment: 13 Wound  Status Wound Number: 2 Primary Open Surgical Wound Etiology: Wound Location: Left Breast Wound Healed - Epithelialized Wounding Event: Surgical Injury Status: Date Acquired: 06/19/2022 Comorbid Sleep Apnea, Hypertension, Peripheral Arterial Disease, Type II Weeks Of Treatment: 13 History: Diabetes, Rheumatoid Arthritis, Osteoarthritis, Neuropathy Clustered Wound: No Photos Wound Measurements Length: (cm) Width: (cm) Depth: (cm) Area: (cm) Volume: (cm) 0 % Reduction in Area: 99.6% 0 % Reduction in Volume: 100% 0 Epithelialization: Small (1-33%) 0 0 Wound Description Classification: Full Thickness With Exposed Suppo Exudate Amount: Medium Exudate Type: Serosanguineous Exudate Color: red, brown rt Structures Foul Odor After Cleansing: No Slough/Fibrino Yes Wound Bed Granulation Amount: Large (67-100%) Exposed Structure Granulation Quality: Pink Fat Layer (Subcutaneous Tissue) Exposed: No Necrotic Amount: None Present (0%) Treatment Notes Wound #2 (Breast) Wound Laterality: Left Cleanser Peri-Wound Care Topical Primary Dressing Secondary Dressing MIALYNN, FUDALA [name] ([id number]) [id number] Page 8 of 8 Secured With Compression Wrap Compression Stockings Facilities manager) Signed: 12/01/2022 12:46:12 PM By: Midge Aver MSN RN CNS WTA Entered By: Midge Aver on 11/27/2022 15:50:33 -------------------------------------------------------------------------------- Vitals Details Patient Name: Date of Service: Courtney Curd [name]. 11/27/2022 3:30 PM Medical Record Number:  [id number] Patient Account Number: [id number] Date of Birth/Sex: Treating RN: [birth year] (82 y.o. Ginette Pitman Primary Care Marice Guidone: Bethann Punches Other Clinician: Referring Jhovani Griswold: Treating Bria Portales/Extender: Margorie John in Treatment: 13 Vital Signs Time Taken: 15:32 Temperature (F): 97.7 Height (in): 67 Pulse (bpm): 71 Weight (lbs):  180 Respiratory Rate (breaths/min): 16 Body Mass Index (BMI): 28.2 Blood Pressure (mmHg): 113/46 Reference Range: 80 - 120 mg / dl Electronic Signature(s) Signed: 12/01/2022 12:46:12 PM By: Midge Aver MSN RN CNS WTA Entered By: Midge Aver on 11/27/2022 15:32:19

## 2023-01-30 ENCOUNTER — Other Ambulatory Visit: Payer: Self-pay | Admitting: General Surgery

## 2023-01-30 DIAGNOSIS — N6320 Unspecified lump in the left breast, unspecified quadrant: Secondary | ICD-10-CM

## 2023-01-30 DIAGNOSIS — R928 Other abnormal and inconclusive findings on diagnostic imaging of breast: Secondary | ICD-10-CM

## 2023-01-30 DIAGNOSIS — Z1231 Encounter for screening mammogram for malignant neoplasm of breast: Secondary | ICD-10-CM

## 2023-01-30 DIAGNOSIS — N63 Unspecified lump in unspecified breast: Secondary | ICD-10-CM

## 2023-01-30 DIAGNOSIS — N6452 Nipple discharge: Secondary | ICD-10-CM

## 2023-01-30 DIAGNOSIS — Z9889 Other specified postprocedural states: Secondary | ICD-10-CM

## 2023-02-01 ENCOUNTER — Other Ambulatory Visit: Payer: Self-pay | Admitting: General Surgery

## 2023-02-01 ENCOUNTER — Encounter: Payer: Self-pay | Admitting: General Surgery

## 2023-02-01 DIAGNOSIS — R2232 Localized swelling, mass and lump, left upper limb: Secondary | ICD-10-CM

## 2023-02-09 ENCOUNTER — Ambulatory Visit
Admission: RE | Admit: 2023-02-09 | Discharge: 2023-02-09 | Disposition: A | Discharge status: Home / Self Care | Payer: Medicare Other | Source: Ambulatory Visit | Attending: General Surgery | Admitting: General Surgery

## 2023-02-09 ENCOUNTER — Ambulatory Visit
Admission: RE | Admit: 2023-02-09 | Discharge: 2023-02-09 | Disposition: A | Discharge status: Home / Self Care | Payer: Medicare Other | Source: Ambulatory Visit | Attending: General Surgery

## 2023-02-09 DIAGNOSIS — R2232 Localized swelling, mass and lump, left upper limb: Secondary | ICD-10-CM

## 2023-02-09 DIAGNOSIS — Z9889 Other specified postprocedural states: Secondary | ICD-10-CM | POA: Insufficient documentation

## 2023-02-09 DIAGNOSIS — N6325 Unspecified lump in the left breast, overlapping quadrants: Secondary | ICD-10-CM | POA: Diagnosis not present

## 2023-02-15 ENCOUNTER — Other Ambulatory Visit (INDEPENDENT_AMBULATORY_CARE_PROVIDER_SITE_OTHER): Payer: Self-pay | Admitting: Nurse Practitioner

## 2023-02-15 DIAGNOSIS — I96 Gangrene, not elsewhere classified: Secondary | ICD-10-CM

## 2023-02-19 ENCOUNTER — Ambulatory Visit (INDEPENDENT_AMBULATORY_CARE_PROVIDER_SITE_OTHER): Payer: Medicare Other

## 2023-02-19 DIAGNOSIS — I96 Gangrene, not elsewhere classified: Secondary | ICD-10-CM | POA: Diagnosis not present

## 2023-02-19 LAB — VAS US ABI WITH/WO TBI

## 2023-03-02 ENCOUNTER — Encounter (INDEPENDENT_AMBULATORY_CARE_PROVIDER_SITE_OTHER): Payer: Self-pay | Admitting: Nurse Practitioner

## 2023-03-02 ENCOUNTER — Ambulatory Visit (INDEPENDENT_AMBULATORY_CARE_PROVIDER_SITE_OTHER): Payer: Medicare Other | Admitting: Nurse Practitioner

## 2023-03-02 VITALS — BP 119/53 | HR 70 | Resp 16

## 2023-03-02 DIAGNOSIS — Z794 Long term (current) use of insulin: Secondary | ICD-10-CM | POA: Diagnosis not present

## 2023-03-02 DIAGNOSIS — I10 Essential (primary) hypertension: Secondary | ICD-10-CM | POA: Diagnosis not present

## 2023-03-02 DIAGNOSIS — E1140 Type 2 diabetes mellitus with diabetic neuropathy, unspecified: Secondary | ICD-10-CM | POA: Diagnosis not present

## 2023-03-02 DIAGNOSIS — I7025 Atherosclerosis of native arteries of other extremities with ulceration: Secondary | ICD-10-CM | POA: Diagnosis not present

## 2023-03-02 NOTE — H&P (View-Only) (Signed)
 Subjective:    Patient ID: Courtney [name], female    DOB: 12-22-1940, 83 y.o.   MRN: [id number] Chief Complaint  Patient presents with   Follow-up    Ref Ether Griffins consult right 2nd toe gangrene    Courtney [name] is an 83 year old female who presents today with gangrenous changes to her right second toe.  She also has some early gangrenous changes on her right first toe as well.  She has a history of peripheral vascular disease with an angiogram done in July which showed tibial vessel disease in the left lower extremity.  She notes that her right lower extremity is painful for her.  She describes rest pain like symptoms.  Today she has noncompressible ABIs bilaterally with a TBI 0.12 on the right and 0.23 on the left.  She has monophasic tibial vessels.    Review of Systems  Cardiovascular:  Positive for leg swelling.  Skin:  Positive for wound.  Neurological:  Positive for weakness.  All other systems reviewed and are negative.      Objective:   Physical Exam Vitals reviewed.  HENT:     Head: Normocephalic.  Cardiovascular:     Rate and Rhythm: Normal rate.  Pulmonary:     Effort: Pulmonary effort is normal.  Musculoskeletal:     Right lower leg: Edema present.  Skin:    General: Skin is warm and dry.  Neurological:     Mental Status: She is alert and oriented to person, place, and time.  Psychiatric:        Mood and Affect: Mood normal.        Behavior: Behavior normal.        Thought Content: Thought content normal.        Judgment: Judgment normal.    BP (!) 119/53   Pulse 70   Resp 16   Past Medical History:  Diagnosis Date   Arthritis    rheumatoid arthritis   B12 deficiency    Cancer (HCC)    skin cancer (squamous cell)   Collagen vascular disease (HCC)    RA   Dermatitis    Diabetes mellitus without complication (HCC)    type 2   Eczema    Fibrosis, pulmonary, interstitial, diffuse (HCC)    GERD (gastroesophageal reflux disease)    Headache     migraines in the past (none since menopause)   Heart murmur    History of hiatal hernia    Hives    "chronic"   Hypertension    Loose stools    Osteoarthritis    lumbar spine, cervical spine, plantar fascitis    Pneumonia    PONV (postoperative nausea and vomiting)    Psoriasis    Rosacea    Sleep apnea 2004   does not use cpap   Urethral stenosis    w/ bladder polyps. followed by Dr Leonette Monarch    Urticaria    chronic    Varicose veins     Social History   Socioeconomic History   Marital status: Married    Spouse name: glenn   Number of children: Not on file   Years of education: Not on file   Highest education level: Not on file  Occupational History   Not on file  Tobacco Use   Smoking status: Never   Smokeless tobacco: Never  Vaping Use   Vaping status: Never Used  Substance and Sexual Activity   Alcohol use: No   Drug use: No  Sexual activity: Not on file  Other Topics Concern   Not on file  Social History Narrative   Not on file   Social Drivers of Health   Financial Resource Strain: Low Risk  (02/01/2023)   Received from [facility]   Overall Financial Resource Strain (CARDIA)    Difficulty of Paying Living Expenses: Not very hard  Food Insecurity: No Food Insecurity (02/01/2023)   Received from [facility]   Hunger Vital Sign    Worried About Running Out of Food in the Last Year: Never true    Ran Out of Food in the Last Year: Never true  Transportation Needs: No Transportation Needs (02/01/2023)   Received from [facility] - [facility]    In the past 12 months, has lack of transportation kept you from medical appointments or from getting medications?: No    Lack of Transportation (Non-Medical): No  Physical Activity: Not on file  Stress: Not on file  Social Connections: Not on file  Intimate Partner Violence: Not At Risk (07/25/2022)   Humiliation, Afraid, Rape, and Kick  questionnaire    Fear of Current or Ex-Partner: No    Emotionally Abused: No    Physically Abused: No    Sexually Abused: No    Past Surgical History:  Procedure Laterality Date   ABDOMINAL AORTOGRAM W/LOWER EXTREMITY Left 07/25/2022   Procedure: ABDOMINAL AORTOGRAM W/LOWER EXTREMITY;  Surgeon: Renford Dills, MD;  Location: [facility] INVASIVE CV LAB;  Service: Cardiovascular;  Laterality: Left;   ANTERIOR CERVICAL DECOMP/DISCECTOMY FUSION N/A 08/20/2017   Procedure: Anterior Cervical Decompression Fusion - Cervical Four - Cervical Five - Cervical Five - Cervical Six;  Surgeon: Julio Sicks, MD;  Location: [facility] OR;  Service: Neurosurgery;  Laterality: N/A;  Anterior Cervical Decompression Fusion - Cervical Four - Cervical Five - Cervical Five - Cervical Six   APPENDECTOMY     BILATERAL CARPAL TUNNEL RELEASE     BREAST BIOPSY Left 04/24/2022   [place] Bx, 6:00 heart clip, SUBAREOLAR BREAST TISSUE WITH RUPTURED DUCT ECTASIA.   BREAST BIOPSY Left 04/24/2022   [place] bx, 4:00 coil clip, SUBAREOLAR BREAST TISSUE WITH AREAS OF STROMAL FIBROSIS.   BREAST BIOPSY Left 04/24/2022   [place] LT BREAST BX W LOC DEV 1ST LESION IMG BX SPEC US GUIDE 04/24/2022 [facility]   BREAST BIOPSY Left 04/24/2022   [place] LT BREAST BX W LOC DEV EA ADD LESION IMG BX SPEC US GUIDE 04/24/2022 [facility]   BREAST BIOPSY WITH RADIO FREQUENCY LOCALIZER Left 06/19/2022   Procedure: BREAST BIOPSY WITH RADIO FREQUENCY LOCALIZER;  Surgeon: Carolan Shiver, MD;  Location: [facility] ORS;  Service: General;  Laterality: Left;   BREAST CYST ASPIRATION Left    neg   CATARACT EXTRACTION Bilateral    CHOLECYSTECTOMY  1963   COLONOSCOPY WITH PROPOFOL N/A 12/31/2015   Procedure: COLONOSCOPY WITH PROPOFOL;  Surgeon: Christena Deem, MD;  Location: [facility] ENDOSCOPY;  Service: Endoscopy;  Laterality: N/A;   ENDOMETRIAL ABLATION  1991   ESOPHAGOGASTRODUODENOSCOPY (EGD) WITH PROPOFOL N/A 12/31/2015   Procedure: ESOPHAGOGASTRODUODENOSCOPY (EGD)  WITH PROPOFOL;  Surgeon: Christena Deem, MD;  Location: [facility] ENDOSCOPY;  Service: Endoscopy;  Laterality: N/A;   ESOPHAGOGASTRODUODENOSCOPY (EGD) WITH PROPOFOL N/A 06/02/2016   Procedure: ESOPHAGOGASTRODUODENOSCOPY (EGD) WITH PROPOFOL;  Surgeon: Christena Deem, MD;  Location: [facility] ENDOSCOPY;  Service: Endoscopy;  Laterality: N/A;   ESOPHAGOGASTRODUODENOSCOPY (EGD) WITH PROPOFOL N/A 11/07/2016   Procedure: ESOPHAGOGASTRODUODENOSCOPY (EGD) WITH PROPOFOL;  Surgeon: Barnetta Chapel  U, MD;  Location: [facility] ENDOSCOPY;  Service: Endoscopy;  Laterality: N/A;   EYE SURGERY Bilateral 2006 and 2012   cataract surgery with lens implant   eyelid surgery Bilateral 2014   FOOT SURGERY Right    ligament and spurs   LUMBAR LAMINECTOMY/DECOMPRESSION MICRODISCECTOMY Right 12/24/2013   Procedure: RIGHT LUMBAR THREE TO FOUR, LUMBAR FOUR TO FIVE, LUMBAR FIVE TO SACRAL ONE LAMINECTOMY/FORAMINOTOMY;  Surgeon: Karn Cassis, MD;  Location: [facility] NEURO ORS;  Service: Neurosurgery;  Laterality: Right;  RIGHT L3-4 L4-5 L5-S1 LAMINECTOMY/FORAMINOTOMY   NASAL SEPTUM SURGERY  2004   SHOULDER SURGERY Right    for a frozen shoulder   SKIN CANCER EXCISION Right    leg x 4   skin cancer removal     TONSILLECTOMY     TUBAL LIGATION  1968    Family History  Problem Relation Age of Onset   COPD Mother    Arthritis/Rheumatoid Mother    Alzheimer's disease Father    Heart attack Father    Breast cancer Paternal Aunt 53    Allergies  Allergen Reactions   Methotrexate Derivatives Shortness Of Breath    Breathing difficulties    Acyclovir And Related Rash   Codeine Nausea Only   Contrast Media [Iodinated Contrast Media] Itching and Rash   Hydroxychloroquine Rash   Inderide [Propranolol-Hctz] Rash   Lodine [Etodolac] Rash   Metrizamide Itching and Rash   Procardia [Nifedipine] Rash   Propranolol Hcl Rash       Latest Ref Rng & Units 07/27/2022    3:35 AM 07/25/2022   12:35 AM 07/24/2022    3:02 PM  CBC   WBC 4.0 - 10.5 K/uL 5.1  6.5  8.7   Hemoglobin 12.0 - 15.0 g/dL 65.7  84.6  96.2   Hematocrit 36.0 - 46.0 % 31.5  36.0  38.4   Platelets 150 - 400 K/uL 93  120  148       CMP     Component Value Date/Time   NA 139 07/27/2022 0335   K 4.1 07/27/2022 0335   CL 107 07/27/2022 0335   CO2 24 07/27/2022 0335   GLUCOSE 293 (H) 07/27/2022 0335   BUN 57 (H) 07/27/2022 0335   CREATININE 1.27 (H) 07/27/2022 0335   CALCIUM 9.1 07/27/2022 0335   PROT 7.1 07/24/2022 1502   ALBUMIN 3.2 (L) 07/24/2022 1502   AST 21 07/24/2022 1502   ALT 14 07/24/2022 1502   ALKPHOS 120 07/24/2022 1502   BILITOT 0.6 07/24/2022 1502   GFRNONAA 42 (L) 07/27/2022 0335     VAS [place] ABI WITH/WO TBI Result Date: 02/19/2023  LOWER EXTREMITY DOPPLER STUDY Patient Name:  KIMYAH FREIN  Date of Exam:   02/19/2023 Medical Rec #: [id number]        Accession #:    [id number] Date of Birth: [birth year]        Patient Gender: F Patient Age:   59 years Exam Location:  [facility] Procedure:      VAS [place] ABI WITH/WO TBI Referring Phys: Vivia Birmingham Cassy Sprowl --------------------------------------------------------------------------------  High Risk Factors: Hypertension, hyperlipidemia, Diabetes, no history of                    smoking.  Comparison Study: 07/25/2022 angiogram showed occluded PTA and Peroneal artery Performing Technologist: Hardie Lora RVT  Examination Guidelines: A complete evaluation includes at minimum, Doppler waveform signals and systolic blood pressure reading at the level of bilateral brachial, anterior tibial,  and posterior tibial arteries, when vessel segments are accessible. Bilateral testing is considered an integral part of a complete examination. Photoelectric Plethysmograph (PPG) waveforms and toe systolic pressure readings are included as required and additional duplex testing as needed. Limited examinations for reoccurring indications may be performed as noted.  ABI Findings:  +---------+------------------+-----+----------+----------------+ Right    Rt Pressure (mmHg)IndexWaveform  Comment          +---------+------------------+-----+----------+----------------+ Brachial 225                                               +---------+------------------+-----+----------+----------------+ ATA      255               1.00 monophasicNon compressible +---------+------------------+-----+----------+----------------+ PTA      96                0.38 monophasic                 +---------+------------------+-----+----------+----------------+ Great Toe26                0.10                            +---------+------------------+-----+----------+----------------+ +---------+------------------+-----+--------+----------------+ Left     Lt Pressure (mmHg)IndexWaveformComment          +---------+------------------+-----+--------+----------------+ Brachial 255                            Non compressible +---------+------------------+-----+--------+----------------+ ATA      255               1.00 biphasicNon compressible +---------+------------------+-----+--------+----------------+ PTA      148               0.58 biphasic                 +---------+------------------+-----+--------+----------------+ Great Toe52                0.20                          +---------+------------------+-----+--------+----------------+ +-------+----------------+-----------+----------------+------------+ ABI/TBIToday's ABI     Today's TBIPrevious ABI    Previous TBI +-------+----------------+-----------+----------------+------------+ Right  Non compressible0.12       Non compressible0.53         +-------+----------------+-----------+----------------+------------+ Left   Non compressible0.23       Non compressible0.49         +-------+----------------+-----------+----------------+------------+  None of the indices are valid secondary to non  compressible left brachial artery. Bilateral ABIs appear essentially unchanged compared to prior study on 11/20/2022.  Summary: Right: Resting right ankle-brachial index indicates noncompressible right lower extremity arteries. The right toe-brachial index is abnormal. Left: Resting left ankle-brachial index indicates noncompressible left lower extremity arteries. The left toe-brachial index is abnormal. *See table(s) above for measurements and observations.  Electronically signed by [name] MD on 02/19/2023 at 4:07:20 PM.    Final        Assessment & Plan:   1. Atherosclerosis of native arteries of the extremities with ulceration (HCC) (Primary)  Recommend:  The patient has evidence of severe atherosclerotic changes of both lower extremities associated with ulceration and tissue loss of the right foot.  This represents a limb threatening ischemia and places the patient at the  risk for right limb loss.  Patient should undergo angiography of the right lower extremity with the hope for intervention for limb salvage.  The risks and benefits as well as the alternative therapies was discussed in detail with the patient.  All questions were answered.  Patient agrees to proceed with right angiography.  The patient will follow up with me in the office after the procedure.   2. Essential hypertension Continue antihypertensive medications as already ordered, these medications have been reviewed and there are no changes at this time.  3. Controlled type 2 diabetes mellitus with diabetic neuropathy, with long-term current use of insulin (HCC) Continue hypoglycemic medications as already ordered, these medications have been reviewed and there are no changes at this time.  Hgb A1C to be monitored as already arranged by primary service   Current Outpatient Medications on File Prior to Visit  Medication Sig Dispense Refill   acetaminophen (TYLENOL) 500 MG tablet Take 500 mg by mouth every 6 (six)  hours as needed (for pain.).      aspirin EC 81 MG tablet Take 81 mg by mouth daily.     bacitracin ointment Apply topically 2 (two) times daily. 120 g 0   bisoprolol (ZEBETA) 10 MG tablet Take 1 tablet (10 mg total) by mouth daily. 30 tablet 1   cetirizine (ZYRTEC) 10 MG tablet Take 10 mg by mouth at bedtime.      Cholecalciferol (D3) 25 MCG (1000 UT) capsule Take 1,000 Units by mouth daily.     famotidine (PEPCID) 40 MG tablet Take 40 mg by mouth daily.     gabapentin (NEURONTIN) 100 MG capsule Take 100 mg by mouth 2 (two) times daily.     inFLIXimab (REMICADE IV) Inject 500 mg into the vein every 6 (six) weeks.     insulin NPH Human (HUMULIN N,NOVOLIN N) 100 UNIT/ML injection Inject 0.4 mLs (40 Units total) into the skin daily before breakfast. (Patient taking differently: Inject 25 Units into the skin in the morning and at bedtime.) 10 mL 11   melatonin 10 MG TABS Take 10 mg by mouth at bedtime. (Patient taking differently: Take 10 mg by mouth as needed.) 30 tablet 0   montelukast (SINGULAIR) 10 MG tablet Take 10 mg by mouth daily.     pantoprazole (PROTONIX) 40 MG tablet Take 40 mg by mouth 2 (two) times daily.     polyethylene glycol (MIRALAX / GLYCOLAX) 17 g packet Take 17 g by mouth daily as needed for mild constipation. 14 each 0   rOPINIRole (REQUIP) 0.5 MG tablet Take 0.5 mg by mouth at bedtime as needed (RLS).     simvastatin (ZOCOR) 40 MG tablet Take 40 mg by mouth at bedtime.     ascorbic acid (VITAMIN C) 500 MG tablet Take 1 tablet (500 mg total) by mouth 2 (two) times daily. (Patient not taking: Reported on 03/02/2023) 20 tablet 0   Multiple Vitamin (MULTIVITAMIN WITH MINERALS) TABS tablet Take 1 tablet by mouth daily. (Patient not taking: Reported on 03/02/2023) 30 tablet 0   zinc sulfate 220 (50 Zn) MG capsule Take 1 capsule (220 mg total) by mouth daily. (Patient not taking: Reported on 03/02/2023) 14 capsule 0   No current facility-administered medications on file prior to visit.     There are no Patient Instructions on file for this visit. No follow-ups on file.   Georgiana Spinner, NP

## 2023-03-02 NOTE — Progress Notes (Signed)
Subjective:    Patient ID: Courtney [name], female    DOB: 12-22-1940, 83 y.o.   MRN: [id number] Chief Complaint  Patient presents with   Follow-up    Ref Ether Griffins consult right 2nd toe gangrene    Courtney [name] is an 83 year old female who presents today with gangrenous changes to her right second toe.  She also has some early gangrenous changes on her right first toe as well.  She has a history of peripheral vascular disease with an angiogram done in July which showed tibial vessel disease in the left lower extremity.  She notes that her right lower extremity is painful for her.  She describes rest pain like symptoms.  Today she has noncompressible ABIs bilaterally with a TBI 0.12 on the right and 0.23 on the left.  She has monophasic tibial vessels.    Review of Systems  Cardiovascular:  Positive for leg swelling.  Skin:  Positive for wound.  Neurological:  Positive for weakness.  All other systems reviewed and are negative.      Objective:   Physical Exam Vitals reviewed.  HENT:     Head: Normocephalic.  Cardiovascular:     Rate and Rhythm: Normal rate.  Pulmonary:     Effort: Pulmonary effort is normal.  Musculoskeletal:     Right lower leg: Edema present.  Skin:    General: Skin is warm and dry.  Neurological:     Mental Status: She is alert and oriented to person, place, and time.  Psychiatric:        Mood and Affect: Mood normal.        Behavior: Behavior normal.        Thought Content: Thought content normal.        Judgment: Judgment normal.    BP (!) 119/53   Pulse 70   Resp 16   Past Medical History:  Diagnosis Date   Arthritis    rheumatoid arthritis   B12 deficiency    Cancer (HCC)    skin cancer (squamous cell)   Collagen vascular disease (HCC)    RA   Dermatitis    Diabetes mellitus without complication (HCC)    type 2   Eczema    Fibrosis, pulmonary, interstitial, diffuse (HCC)    GERD (gastroesophageal reflux disease)    Headache     migraines in the past (none since menopause)   Heart murmur    History of hiatal hernia    Hives    "chronic"   Hypertension    Loose stools    Osteoarthritis    lumbar spine, cervical spine, plantar fascitis    Pneumonia    PONV (postoperative nausea and vomiting)    Psoriasis    Rosacea    Sleep apnea 2004   does not use cpap   Urethral stenosis    w/ bladder polyps. followed by Dr Leonette Monarch    Urticaria    chronic    Varicose veins     Social History   Socioeconomic History   Marital status: Married    Spouse name: glenn   Number of children: Not on file   Years of education: Not on file   Highest education level: Not on file  Occupational History   Not on file  Tobacco Use   Smoking status: Never   Smokeless tobacco: Never  Vaping Use   Vaping status: Never Used  Substance and Sexual Activity   Alcohol use: No   Drug use: No  Sexual activity: Not on file  Other Topics Concern   Not on file  Social History Narrative   Not on file   Social Drivers of Health   Financial Resource Strain: Low Risk  (02/01/2023)   Received from [facility]   Overall Financial Resource Strain (CARDIA)    Difficulty of Paying Living Expenses: Not very hard  Food Insecurity: No Food Insecurity (02/01/2023)   Received from [facility]   Hunger Vital Sign    Worried About Running Out of Food in the Last Year: Never true    Ran Out of Food in the Last Year: Never true  Transportation Needs: No Transportation Needs (02/01/2023)   Received from [facility] - Transportation    In the past 12 months, has lack of transportation kept you from medical appointments or from getting medications?: No    Lack of Transportation (Non-Medical): No  Physical Activity: Not on file  Stress: Not on file  Social Connections: Not on file  Intimate Partner Violence: Not At Risk (07/25/2022)   Humiliation, Afraid, Rape, and Kick  questionnaire    Fear of Current or Ex-Partner: No    Emotionally Abused: No    Physically Abused: No    Sexually Abused: No    Past Surgical History:  Procedure Laterality Date   ABDOMINAL AORTOGRAM W/LOWER EXTREMITY Left 07/25/2022   Procedure: ABDOMINAL AORTOGRAM W/LOWER EXTREMITY;  Surgeon: Renford Dills, MD;  Location: [facility] INVASIVE CV LAB;  Service: Cardiovascular;  Laterality: Left;   ANTERIOR CERVICAL DECOMP/DISCECTOMY FUSION N/A 08/20/2017   Procedure: Anterior Cervical Decompression Fusion - Cervical Four - Cervical Five - Cervical Five - Cervical Six;  Surgeon: Julio Sicks, MD;  Location: [facility] OR;  Service: Neurosurgery;  Laterality: N/A;  Anterior Cervical Decompression Fusion - Cervical Four - Cervical Five - Cervical Five - Cervical Six   APPENDECTOMY     BILATERAL CARPAL TUNNEL RELEASE     BREAST BIOPSY Left 04/24/2022   [place] Bx, 6:00 heart clip, SUBAREOLAR BREAST TISSUE WITH RUPTURED DUCT ECTASIA.   BREAST BIOPSY Left 04/24/2022   [place] bx, 4:00 coil clip, SUBAREOLAR BREAST TISSUE WITH AREAS OF STROMAL FIBROSIS.   BREAST BIOPSY Left 04/24/2022   [place] LT BREAST BX W LOC DEV 1ST LESION IMG BX SPEC US GUIDE 04/24/2022 [facility]   BREAST BIOPSY Left 04/24/2022   [place] LT BREAST BX W LOC DEV EA ADD LESION IMG BX SPEC US GUIDE 04/24/2022 [facility]   BREAST BIOPSY WITH RADIO FREQUENCY LOCALIZER Left 06/19/2022   Procedure: BREAST BIOPSY WITH RADIO FREQUENCY LOCALIZER;  Surgeon: Carolan Shiver, MD;  Location: [facility] ORS;  Service: General;  Laterality: Left;   BREAST CYST ASPIRATION Left    neg   CATARACT EXTRACTION Bilateral    CHOLECYSTECTOMY  1963   COLONOSCOPY WITH PROPOFOL N/A 12/31/2015   Procedure: COLONOSCOPY WITH PROPOFOL;  Surgeon: Christena Deem, MD;  Location: [facility] ENDOSCOPY;  Service: Endoscopy;  Laterality: N/A;   ENDOMETRIAL ABLATION  1991   ESOPHAGOGASTRODUODENOSCOPY (EGD) WITH PROPOFOL N/A 12/31/2015   Procedure: ESOPHAGOGASTRODUODENOSCOPY (EGD)  WITH PROPOFOL;  Surgeon: Christena Deem, MD;  Location: [facility] ENDOSCOPY;  Service: Endoscopy;  Laterality: N/A;   ESOPHAGOGASTRODUODENOSCOPY (EGD) WITH PROPOFOL N/A 06/02/2016   Procedure: ESOPHAGOGASTRODUODENOSCOPY (EGD) WITH PROPOFOL;  Surgeon: Christena Deem, MD;  Location: [facility] ENDOSCOPY;  Service: Endoscopy;  Laterality: N/A;   ESOPHAGOGASTRODUODENOSCOPY (EGD) WITH PROPOFOL N/A 11/07/2016   Procedure: ESOPHAGOGASTRODUODENOSCOPY (EGD) WITH PROPOFOL;  Surgeon: Barnetta Chapel  U, MD;  Location: [facility] ENDOSCOPY;  Service: Endoscopy;  Laterality: N/A;   EYE SURGERY Bilateral 2006 and 2012   cataract surgery with lens implant   eyelid surgery Bilateral 2014   FOOT SURGERY Right    ligament and spurs   LUMBAR LAMINECTOMY/DECOMPRESSION MICRODISCECTOMY Right 12/24/2013   Procedure: RIGHT LUMBAR THREE TO FOUR, LUMBAR FOUR TO FIVE, LUMBAR FIVE TO SACRAL ONE LAMINECTOMY/FORAMINOTOMY;  Surgeon: Karn Cassis, MD;  Location: [facility] NEURO ORS;  Service: Neurosurgery;  Laterality: Right;  RIGHT L3-4 L4-5 L5-S1 LAMINECTOMY/FORAMINOTOMY   NASAL SEPTUM SURGERY  2004   SHOULDER SURGERY Right    for a frozen shoulder   SKIN CANCER EXCISION Right    leg x 4   skin cancer removal     TONSILLECTOMY     TUBAL LIGATION  1968    Family History  Problem Relation Age of Onset   COPD Mother    Arthritis/Rheumatoid Mother    Alzheimer's disease Father    Heart attack Father    Breast cancer Paternal Aunt 53    Allergies  Allergen Reactions   Methotrexate Derivatives Shortness Of Breath    Breathing difficulties    Acyclovir And Related Rash   Codeine Nausea Only   Contrast Media [Iodinated Contrast Media] Itching and Rash   Hydroxychloroquine Rash   Inderide [Propranolol-Hctz] Rash   Lodine [Etodolac] Rash   Metrizamide Itching and Rash   Procardia [Nifedipine] Rash   Propranolol Hcl Rash       Latest Ref Rng & Units 07/27/2022    3:35 AM 07/25/2022   12:35 AM 07/24/2022    3:02 PM  CBC   WBC 4.0 - 10.5 K/uL 5.1  6.5  8.7   Hemoglobin 12.0 - 15.0 g/dL 65.7  84.6  96.2   Hematocrit 36.0 - 46.0 % 31.5  36.0  38.4   Platelets 150 - 400 K/uL 93  120  148       CMP     Component Value Date/Time   NA 139 07/27/2022 0335   K 4.1 07/27/2022 0335   CL 107 07/27/2022 0335   CO2 24 07/27/2022 0335   GLUCOSE 293 (H) 07/27/2022 0335   BUN 57 (H) 07/27/2022 0335   CREATININE 1.27 (H) 07/27/2022 0335   CALCIUM 9.1 07/27/2022 0335   PROT 7.1 07/24/2022 1502   ALBUMIN 3.2 (L) 07/24/2022 1502   AST 21 07/24/2022 1502   ALT 14 07/24/2022 1502   ALKPHOS 120 07/24/2022 1502   BILITOT 0.6 07/24/2022 1502   GFRNONAA 42 (L) 07/27/2022 0335     VAS [place] ABI WITH/WO TBI Result Date: 02/19/2023  LOWER EXTREMITY DOPPLER STUDY Patient Name:  KIMYAH FREIN  Date of Exam:   02/19/2023 Medical Rec #: [id number]        Accession #:    [id number] Date of Birth: [birth year]        Patient Gender: F Patient Age:   59 years Exam Location:  [facility] Procedure:      VAS [place] ABI WITH/WO TBI Referring Phys: Vivia Birmingham Cassy Sprowl --------------------------------------------------------------------------------  High Risk Factors: Hypertension, hyperlipidemia, Diabetes, no history of                    smoking.  Comparison Study: 07/25/2022 angiogram showed occluded PTA and Peroneal artery Performing Technologist: Hardie Lora RVT  Examination Guidelines: A complete evaluation includes at minimum, Doppler waveform signals and systolic blood pressure reading at the level of bilateral brachial, anterior tibial,  and posterior tibial arteries, when vessel segments are accessible. Bilateral testing is considered an integral part of a complete examination. Photoelectric Plethysmograph (PPG) waveforms and toe systolic pressure readings are included as required and additional duplex testing as needed. Limited examinations for reoccurring indications may be performed as noted.  ABI Findings:  +---------+------------------+-----+----------+----------------+ Right    Rt Pressure (mmHg)IndexWaveform  Comment          +---------+------------------+-----+----------+----------------+ Brachial 225                                               +---------+------------------+-----+----------+----------------+ ATA      255               1.00 monophasicNon compressible +---------+------------------+-----+----------+----------------+ PTA      96                0.38 monophasic                 +---------+------------------+-----+----------+----------------+ Great Toe26                0.10                            +---------+------------------+-----+----------+----------------+ +---------+------------------+-----+--------+----------------+ Left     Lt Pressure (mmHg)IndexWaveformComment          +---------+------------------+-----+--------+----------------+ Brachial 255                            Non compressible +---------+------------------+-----+--------+----------------+ ATA      255               1.00 biphasicNon compressible +---------+------------------+-----+--------+----------------+ PTA      148               0.58 biphasic                 +---------+------------------+-----+--------+----------------+ Great Toe52                0.20                          +---------+------------------+-----+--------+----------------+ +-------+----------------+-----------+----------------+------------+ ABI/TBIToday's ABI     Today's TBIPrevious ABI    Previous TBI +-------+----------------+-----------+----------------+------------+ Right  Non compressible0.12       Non compressible0.53         +-------+----------------+-----------+----------------+------------+ Left   Non compressible0.23       Non compressible0.49         +-------+----------------+-----------+----------------+------------+  None of the indices are valid secondary to non  compressible left brachial artery. Bilateral ABIs appear essentially unchanged compared to prior study on 11/20/2022.  Summary: Right: Resting right ankle-brachial index indicates noncompressible right lower extremity arteries. The right toe-brachial index is abnormal. Left: Resting left ankle-brachial index indicates noncompressible left lower extremity arteries. The left toe-brachial index is abnormal. *See table(s) above for measurements and observations.  Electronically signed by [name] MD on 02/19/2023 at 4:07:20 PM.    Final        Assessment & Plan:   1. Atherosclerosis of native arteries of the extremities with ulceration (HCC) (Primary)  Recommend:  The patient has evidence of severe atherosclerotic changes of both lower extremities associated with ulceration and tissue loss of the right foot.  This represents a limb threatening ischemia and places the patient at the  risk for right limb loss.  Patient should undergo angiography of the right lower extremity with the hope for intervention for limb salvage.  The risks and benefits as well as the alternative therapies was discussed in detail with the patient.  All questions were answered.  Patient agrees to proceed with right angiography.  The patient will follow up with me in the office after the procedure.   2. Essential hypertension Continue antihypertensive medications as already ordered, these medications have been reviewed and there are no changes at this time.  3. Controlled type 2 diabetes mellitus with diabetic neuropathy, with long-term current use of insulin (HCC) Continue hypoglycemic medications as already ordered, these medications have been reviewed and there are no changes at this time.  Hgb A1C to be monitored as already arranged by primary service   Current Outpatient Medications on File Prior to Visit  Medication Sig Dispense Refill   acetaminophen (TYLENOL) 500 MG tablet Take 500 mg by mouth every 6 (six)  hours as needed (for pain.).      aspirin EC 81 MG tablet Take 81 mg by mouth daily.     bacitracin ointment Apply topically 2 (two) times daily. 120 g 0   bisoprolol (ZEBETA) 10 MG tablet Take 1 tablet (10 mg total) by mouth daily. 30 tablet 1   cetirizine (ZYRTEC) 10 MG tablet Take 10 mg by mouth at bedtime.      Cholecalciferol (D3) 25 MCG (1000 UT) capsule Take 1,000 Units by mouth daily.     famotidine (PEPCID) 40 MG tablet Take 40 mg by mouth daily.     gabapentin (NEURONTIN) 100 MG capsule Take 100 mg by mouth 2 (two) times daily.     inFLIXimab (REMICADE IV) Inject 500 mg into the vein every 6 (six) weeks.     insulin NPH Human (HUMULIN N,NOVOLIN N) 100 UNIT/ML injection Inject 0.4 mLs (40 Units total) into the skin daily before breakfast. (Patient taking differently: Inject 25 Units into the skin in the morning and at bedtime.) 10 mL 11   melatonin 10 MG TABS Take 10 mg by mouth at bedtime. (Patient taking differently: Take 10 mg by mouth as needed.) 30 tablet 0   montelukast (SINGULAIR) 10 MG tablet Take 10 mg by mouth daily.     pantoprazole (PROTONIX) 40 MG tablet Take 40 mg by mouth 2 (two) times daily.     polyethylene glycol (MIRALAX / GLYCOLAX) 17 g packet Take 17 g by mouth daily as needed for mild constipation. 14 each 0   rOPINIRole (REQUIP) 0.5 MG tablet Take 0.5 mg by mouth at bedtime as needed (RLS).     simvastatin (ZOCOR) 40 MG tablet Take 40 mg by mouth at bedtime.     ascorbic acid (VITAMIN C) 500 MG tablet Take 1 tablet (500 mg total) by mouth 2 (two) times daily. (Patient not taking: Reported on 03/02/2023) 20 tablet 0   Multiple Vitamin (MULTIVITAMIN WITH MINERALS) TABS tablet Take 1 tablet by mouth daily. (Patient not taking: Reported on 03/02/2023) 30 tablet 0   zinc sulfate 220 (50 Zn) MG capsule Take 1 capsule (220 mg total) by mouth daily. (Patient not taking: Reported on 03/02/2023) 14 capsule 0   No current facility-administered medications on file prior to visit.     There are no Patient Instructions on file for this visit. No follow-ups on file.   Georgiana Spinner, NP

## 2023-03-06 ENCOUNTER — Telehealth (INDEPENDENT_AMBULATORY_CARE_PROVIDER_SITE_OTHER): Payer: Self-pay

## 2023-03-06 NOTE — Telephone Encounter (Signed)
 Spoke with the patient and she is scheduled with Dr. Gilda Crease on 03/07/23 with a 9:15 am arrival time to the [facility] for a RLE angio. Pre-procedure instructions were discussed and will be sent to Mychart.

## 2023-03-07 ENCOUNTER — Encounter
Admission: RE | Disposition: A | Discharge status: Home / Self Care | Payer: Self-pay | Source: Ambulatory Visit | Attending: Vascular Surgery

## 2023-03-07 ENCOUNTER — Other Ambulatory Visit: Payer: Self-pay

## 2023-03-07 ENCOUNTER — Ambulatory Visit
Admission: RE | Admit: 2023-03-07 | Discharge: 2023-03-07 | Disposition: A | Discharge status: Home / Self Care | Payer: Medicare Other | Source: Ambulatory Visit | Attending: Vascular Surgery | Admitting: Vascular Surgery

## 2023-03-07 ENCOUNTER — Encounter: Payer: Self-pay | Admitting: Vascular Surgery

## 2023-03-07 DIAGNOSIS — Z794 Long term (current) use of insulin: Secondary | ICD-10-CM | POA: Insufficient documentation

## 2023-03-07 DIAGNOSIS — I70261 Atherosclerosis of native arteries of extremities with gangrene, right leg: Secondary | ICD-10-CM | POA: Insufficient documentation

## 2023-03-07 DIAGNOSIS — I10 Essential (primary) hypertension: Secondary | ICD-10-CM | POA: Insufficient documentation

## 2023-03-07 DIAGNOSIS — L97909 Non-pressure chronic ulcer of unspecified part of unspecified lower leg with unspecified severity: Secondary | ICD-10-CM

## 2023-03-07 DIAGNOSIS — L97519 Non-pressure chronic ulcer of other part of right foot with unspecified severity: Secondary | ICD-10-CM | POA: Insufficient documentation

## 2023-03-07 DIAGNOSIS — E11621 Type 2 diabetes mellitus with foot ulcer: Secondary | ICD-10-CM | POA: Insufficient documentation

## 2023-03-07 DIAGNOSIS — E1152 Type 2 diabetes mellitus with diabetic peripheral angiopathy with gangrene: Secondary | ICD-10-CM | POA: Insufficient documentation

## 2023-03-07 HISTORY — PX: LOWER EXTREMITY ANGIOGRAPHY: CATH118251

## 2023-03-07 LAB — CREATININE, SERUM
Creatinine, Ser: 1.46 mg/dL — ABNORMAL HIGH (ref 0.44–1.00)
GFR, Estimated: 35 mL/min — ABNORMAL LOW (ref >60)

## 2023-03-07 LAB — GLUCOSE, CAPILLARY
Glucose-Capillary: 104 mg/dL — ABNORMAL HIGH (ref 70–99)
Glucose-Capillary: 96 mg/dL (ref 70–99)

## 2023-03-07 LAB — BUN: BUN: 53 mg/dL — ABNORMAL HIGH (ref 8–23)

## 2023-03-07 SURGERY — LOWER EXTREMITY ANGIOGRAPHY
Anesthesia: Moderate Sedation | Site: Leg Lower | Laterality: Right

## 2023-03-07 MED ORDER — LABETALOL HCL 5 MG/ML IV SOLN
10.0000 mg | INTRAVENOUS | Status: DC | PRN
  Administered 2023-03-07: 10 mg via INTRAVENOUS

## 2023-03-07 MED ORDER — HYDROMORPHONE HCL 1 MG/ML IJ SOLN: 1.0000 mg | Freq: Once | INTRAMUSCULAR | Status: DC | PRN

## 2023-03-07 MED ORDER — HYDRALAZINE HCL 20 MG/ML IJ SOLN: 5.0000 mg | INTRAMUSCULAR | Status: DC | PRN

## 2023-03-07 MED ORDER — SODIUM CHLORIDE 0.9% FLUSH: 3.0000 mL | Freq: Two times a day (BID) | INTRAVENOUS | Status: DC

## 2023-03-07 MED ORDER — FENTANYL CITRATE (PF) 100 MCG/2ML IJ SOLN
INTRAMUSCULAR | Status: AC
  Filled 2023-03-07: qty 2

## 2023-03-07 MED ORDER — CLOPIDOGREL BISULFATE 75 MG PO TABS: 75.0000 mg | ORAL_TABLET | Freq: Every day | ORAL | Status: DC

## 2023-03-07 MED ORDER — LABETALOL HCL 5 MG/ML IV SOLN
INTRAVENOUS | Status: AC
  Filled 2023-03-07: qty 4

## 2023-03-07 MED ORDER — CLOPIDOGREL BISULFATE 75 MG PO TABS: 75.0000 mg | ORAL_TABLET | Freq: Every day | ORAL | 11 refills | Status: AC

## 2023-03-07 MED ORDER — HEPARIN (PORCINE) IN NACL 1000-0.9 UT/500ML-% IV SOLN
INTRAVENOUS | Status: DC | PRN
  Administered 2023-03-07: 1000 mL

## 2023-03-07 MED ORDER — NITROGLYCERIN 1 MG/10 ML FOR IR/CATH LAB
INTRA_ARTERIAL | Status: AC
  Filled 2023-03-07: qty 10

## 2023-03-07 MED ORDER — FENTANYL CITRATE (PF) 100 MCG/2ML IJ SOLN
INTRAMUSCULAR | Status: DC | PRN
  Administered 2023-03-07 (×2): 25 ug via INTRAVENOUS
  Administered 2023-03-07: 50 ug via INTRAVENOUS
  Administered 2023-03-07 (×2): 25 ug via INTRAVENOUS

## 2023-03-07 MED ORDER — METHYLPREDNISOLONE SODIUM SUCC 125 MG IJ SOLR
INTRAMUSCULAR | Status: AC
  Filled 2023-03-07: qty 2

## 2023-03-07 MED ORDER — MIDAZOLAM HCL 5 MG/5ML IJ SOLN
INTRAMUSCULAR | Status: AC
Start: 2023-03-07 — End: ?
  Filled 2023-03-07: qty 5

## 2023-03-07 MED ORDER — MIDAZOLAM HCL 2 MG/2ML IJ SOLN
INTRAMUSCULAR | Status: DC | PRN
  Administered 2023-03-07 (×3): .5 mg via INTRAVENOUS
  Administered 2023-03-07: 1 mg via INTRAVENOUS
  Administered 2023-03-07: .5 mg via INTRAVENOUS

## 2023-03-07 MED ORDER — HEPARIN SODIUM (PORCINE) 1000 UNIT/ML IJ SOLN
INTRAMUSCULAR | Status: AC
  Filled 2023-03-07: qty 10

## 2023-03-07 MED ORDER — FAMOTIDINE 20 MG PO TABS
40.0000 mg | ORAL_TABLET | Freq: Once | ORAL | Status: AC | PRN
Start: 2023-03-07 — End: 2023-03-07
  Administered 2023-03-07: 40 mg via ORAL

## 2023-03-07 MED ORDER — ACETAMINOPHEN 325 MG PO TABS: 650.0000 mg | ORAL_TABLET | ORAL | Status: DC | PRN

## 2023-03-07 MED ORDER — LIDOCAINE-EPINEPHRINE (PF) 1 %-1:200000 IJ SOLN
INTRAMUSCULAR | Status: DC | PRN
  Administered 2023-03-07: 10 mL

## 2023-03-07 MED ORDER — SODIUM CHLORIDE 0.9% FLUSH: 3.0000 mL | INTRAVENOUS | Status: DC | PRN

## 2023-03-07 MED ORDER — SODIUM CHLORIDE 0.9 % IV SOLN: 250.0000 mL | INTRAVENOUS | Status: DC | PRN

## 2023-03-07 MED ORDER — HEPARIN SODIUM (PORCINE) 1000 UNIT/ML IJ SOLN
INTRAMUSCULAR | Status: DC | PRN
  Administered 2023-03-07: 4000 [IU] via INTRAVENOUS

## 2023-03-07 MED ORDER — SODIUM CHLORIDE 0.9 % IV SOLN: INTRAVENOUS | Status: DC

## 2023-03-07 MED ORDER — DIPHENHYDRAMINE HCL 50 MG/ML IJ SOLN
50.0000 mg | Freq: Once | INTRAMUSCULAR | Status: AC | PRN
  Administered 2023-03-07: 50 mg via INTRAVENOUS

## 2023-03-07 MED ORDER — METHYLPREDNISOLONE SODIUM SUCC 125 MG IJ SOLR
125.0000 mg | Freq: Once | INTRAMUSCULAR | Status: AC | PRN
  Administered 2023-03-07: 125 mg via INTRAVENOUS

## 2023-03-07 MED ORDER — NITROGLYCERIN 1 MG/10 ML FOR IR/CATH LAB
INTRA_ARTERIAL | Status: DC | PRN
  Administered 2023-03-07: 400 ug via INTRA_ARTERIAL

## 2023-03-07 MED ORDER — DIPHENHYDRAMINE HCL 50 MG/ML IJ SOLN
INTRAMUSCULAR | Status: AC
  Filled 2023-03-07: qty 1

## 2023-03-07 MED ORDER — MIDAZOLAM HCL 2 MG/ML PO SYRP: 8.0000 mg | ORAL_SOLUTION | Freq: Once | ORAL | Status: DC | PRN

## 2023-03-07 MED ORDER — IODIXANOL 320 MG/ML IV SOLN
INTRAVENOUS | Status: DC | PRN
  Administered 2023-03-07: 50 mL

## 2023-03-07 MED ORDER — ONDANSETRON HCL 4 MG/2ML IJ SOLN: 4.0000 mg | Freq: Four times a day (QID) | INTRAMUSCULAR | Status: DC | PRN

## 2023-03-07 MED ORDER — CEFAZOLIN SODIUM-DEXTROSE 2-4 GM/100ML-% IV SOLN
INTRAVENOUS | Status: AC
  Filled 2023-03-07: qty 100

## 2023-03-07 MED ORDER — FAMOTIDINE 20 MG PO TABS
ORAL_TABLET | ORAL | Status: AC
  Filled 2023-03-07: qty 2

## 2023-03-07 MED ORDER — CEFAZOLIN SODIUM-DEXTROSE 2-4 GM/100ML-% IV SOLN
2.0000 g | INTRAVENOUS | Status: AC
  Administered 2023-03-07: 2 g via INTRAVENOUS

## 2023-03-07 SURGICAL SUPPLY — 20 items
BALLN ARMADA 2.5X100X150 (BALLOONS) ×1 IMPLANT
BALLN ARMADA 2X40X150 (BALLOONS) ×1 IMPLANT
BALLN ULTRVRSE 018 2.5X100X150 (BALLOONS) ×1 IMPLANT
BALLOON ARMADA 2.5X100X150 (BALLOONS) IMPLANT
BALLOON ARMADA 2X40X150 (BALLOONS) IMPLANT
BALLOON ULTRVS 018 2.5X100X150 (BALLOONS) IMPLANT
CATH ANGIO 5F PIGTAIL 65CM (CATHETERS) IMPLANT
CATH SEEKER .035X135CM (CATHETERS) IMPLANT
CATH TEMPO 5F RIM 65CM (CATHETERS) IMPLANT
COVER PROBE ULTRASOUND 5X96 (MISCELLANEOUS) IMPLANT
DEVICE PRESTO INFLATION (MISCELLANEOUS) IMPLANT
DEVICE STARCLOSE SE CLOSURE (Vascular Products) IMPLANT
GLIDEWIRE ADV .035X260CM (WIRE) IMPLANT
PACK ANGIOGRAPHY (CUSTOM PROCEDURE TRAY) ×1 IMPLANT
SHEATH BRITE TIP 5FRX11 (SHEATH) IMPLANT
SHEATH RAABE 6FRX70 (SHEATH) IMPLANT
SYR MEDRAD MARK 7 150ML (SYRINGE) IMPLANT
TUBING CONTRAST HIGH PRESS 72 (TUBING) IMPLANT
WIRE G V18X300CM (WIRE) IMPLANT
WIRE J 3MM .035X145CM (WIRE) IMPLANT

## 2023-03-07 NOTE — Op Note (Signed)
 [facility]  Percutaneous Study/Intervention Procedural Note   Date of Surgery: 03/07/2023  Surgeon(s):Micah Barnier    Assistants:none  Pre-operative Diagnosis: PAD with gangrene right foot  Post-operative diagnosis:  Same  Procedure(s) Performed:             1.  Ultrasound guidance for vascular access left femoral artery             2.  Catheter placement into right common femoral artery from left femoral approach             3.  Aortogram and selective right lower extremity angiogram             4.  Percutaneous transluminal angioplasty of right anterior tibial artery in the distal segment with 2 and 2-1/2 mm diameter angioplasty balloons             5.  StarClose closure device left femoral artery  EBL: 5 cc  Contrast: 50 cc  Fluoro Time: 6.4 minutes  Moderate Conscious Sedation Time: approximately 48 minutes using 3 mg of Versed and 150 mcg of Fentanyl              Indications:  Patient is a 83 y.o.female with gangrenous change to the right foor. The patient has noninvasive study showing a markedly reduced right digital pressures. The patient is brought in for angiography for further evaluation and potential treatment.  Due to the limb threatening nature of the situation, angiogram was performed for attempted limb salvage. The patient is aware that if the procedure fails, amputation would be expected.  The patient also understands that even with successful revascularization, amputation may still be required due to the severity of the situation. Risks and benefits are discussed and informed consent is obtained.   Procedure:  The patient was identified and appropriate procedural time out was performed.  The patient was then placed supine on the table and prepped and draped in the usual sterile fashion. Moderate conscious sedation was administered during a face to face encounter with the patient throughout the procedure with my supervision of the RN administering  medicines and monitoring the patient's vital signs, pulse oximetry, telemetry and mental status throughout from the start of the procedure until the patient was taken to the recovery room. Ultrasound was used to evaluate the left common femoral artery.  It was patent .  A digital ultrasound image was acquired.  A Seldinger needle was used to access the left common femoral artery under direct ultrasound guidance and a permanent image was performed.  A 0.035 J wire was advanced without resistance and a 5Fr sheath was placed.  Pigtail catheter was placed into the aorta and an AP aortogram was performed. This demonstrated normal renal arteries and normal aorta and iliac segments without significant stenosis. I then crossed the aortic bifurcation and advanced to the right femoral head. Selective right lower extremity angiogram was then performed. This demonstrated calcific but not stenotic right common femoral artery, superficial femoral artery, and popliteal artery.  There was a typical tibial trifurcation.  The anterior tibial artery was the only runoff to the foot.  Both peroneal and posterior tibial arteries were small and heavily diseased and were not continuous distally.  The anterior tibial artery was fairly in terms of its flow down to the distal anterior tibial artery about 5 cm above the ankle where there was a focal high-grade near occlusive stenosis of greater than 95%. It was felt that it was in the  patient's best interest to proceed with intervention after these images to avoid a second procedure and a larger amount of contrast and fluoroscopy based off of the findings from the initial angiogram. The patient was systemically heparinized and a 6 French 70 cm sheath was then placed over the Terumo Advantage wire. I then used a Kumpe catheter and the advantage wire to get down into the proximal anterior tibial artery where I then exchanged for a V18 wire.  I then crossed the lesion without difficulty with a  V18 wire parking the wire in the foot.  Initially, attempts to get a 2.5 mm diameter to cross the lesion were unsuccessful so I downsized to a 2 mm diameter by 4 cm length angioplasty balloon and was able to cross the lesion in the distal anterior tibial artery.  This was inflated to 12 atm for 1 minute.  Following this, the 2.5 mm diameter angioplasty balloon was able to cross the lesion and this was inflated up to 10 atm for 1 minute.  Completion imaging showed a marked improvement with about a 30% residual stenosis which was not flow-limiting. I elected to terminate the procedure. The sheath was removed and StarClose closure device was deployed in the left femoral artery with excellent hemostatic result. The patient was taken to the recovery room in stable condition having tolerated the procedure well.  Findings:               Aortogram:  This demonstrated normal renal arteries and normal aorta and iliac segments without significant stenosis             Right Lower Extremity:  This demonstrated calcific but not stenotic right common femoral artery, superficial femoral artery, and popliteal artery.  There was a typical tibial trifurcation.  The anterior tibial artery was the only runoff to the foot.  Both peroneal and posterior tibial arteries were small and heavily diseased and were not continuous distally.  The anterior tibial artery was fairly in terms of its flow down to the distal anterior tibial artery about 5 cm above the ankle where there was a focal high-grade near occlusive stenosis of greater than 95%.   Disposition: Patient was taken to the recovery room in stable condition having tolerated the procedure well.  Complications: None  Festus Barren 03/07/2023 12:10 PM   This note was created with Dragon Medical transcription system. Any errors in dictation are purely unintentional.

## 2023-03-07 NOTE — Discharge Instructions (Signed)

## 2023-03-07 NOTE — Interval H&P Note (Signed)
 History and Physical Interval Note:  03/07/2023 9:31 AM  Courtney [name]  has presented today for surgery, with the diagnosis of RLE Angio   ASO w ulceration.  The various methods of treatment have been discussed with the patient and family. After consideration of risks, benefits and other options for treatment, the patient has consented to  Procedure(s): Lower Extremity Angiography (Right) as a surgical intervention.  The patient's history has been reviewed, patient examined, no change in status, stable for surgery.  I have reviewed the patient's chart and labs.  Questions were answered to the patient's satisfaction.     Festus Barren

## 2023-03-08 ENCOUNTER — Other Ambulatory Visit: Payer: Self-pay | Admitting: Podiatry

## 2023-03-13 ENCOUNTER — Other Ambulatory Visit: Payer: Self-pay

## 2023-03-13 ENCOUNTER — Encounter
Admission: RE | Admit: 2023-03-13 | Discharge: 2023-03-13 | Disposition: A | Discharge status: Home / Self Care | Payer: Medicare Other | Source: Ambulatory Visit | Attending: Podiatry | Admitting: Podiatry

## 2023-03-13 VITALS — Ht 67.0 in | Wt 166.0 lb

## 2023-03-13 DIAGNOSIS — I10 Essential (primary) hypertension: Secondary | ICD-10-CM

## 2023-03-13 DIAGNOSIS — E1140 Type 2 diabetes mellitus with diabetic neuropathy, unspecified: Secondary | ICD-10-CM

## 2023-03-13 DIAGNOSIS — Z79899 Other long term (current) drug therapy: Secondary | ICD-10-CM

## 2023-03-13 DIAGNOSIS — E782 Mixed hyperlipidemia: Secondary | ICD-10-CM

## 2023-03-13 NOTE — Patient Instructions (Addendum)
 Your procedure is scheduled on: Friday 03/16/23 To find out your arrival time, please call [phone] between 1PM - 3PM on:   Thursday 03/15/23 Report to the Registration Desk on the 1st floor of the [facility]. Free Valet parking is available.  If your arrival time is 6:00 am, do not arrive before that time as the [facility] entrance doors do not open until 6:00 am.  REMEMBER: Instructions that are not followed completely may result in serious medical risk, up to and including death; or upon the discretion of your surgeon and anesthesiologist your surgery may need to be rescheduled.  Do not eat food after midnight the night before surgery.  No gum chewing or hard candies.  You may however, drink CLEAR liquids up to 2 hours before you are scheduled to arrive for your surgery. Do not drink anything within 2 hours of your scheduled arrival time.  Clear liquids include: - water   Type 1 and Type 2 diabetics should only drink water.  In addition, your doctor has ordered for you to drink the provided:   Gatorade G2 Drinking this carbohydrate drink up to two hours before surgery helps to reduce insulin resistance and improve patient outcomes. Please complete drinking 2 hours before scheduled arrival time.  One week prior to surgery: Stop Anti-inflammatories (NSAIDS) such as Advil, Aleve, Ibuprofen, Motrin, Naproxen, Naprosyn and  Excedrin, Goody's Powder, BC Powder. You may however, continue to take Tylenol if needed for pain up until the day of surgery.  Stop ANY OVER THE COUNTER supplements and vitamins until after surgery.  Continue taking all prescribed medications.   **Follow guidelines for insulin and diabetes medications** Decrease your nightime insulin to 12 units Thursday night 03/15/23.  Follow recommendations from Cardiologist or PCP regarding stopping blood thinners. You do not have to stop your Aspirin or Plavix. You will not take it on Friday 03/16/23.  TAKE ONLY THESE  MEDICATIONS THE MORNING OF SURGERY WITH A SIP OF WATER:  bisoprolol (ZEBETA) 10 MG tablet  famotidine (PEPCID) 40 MG tablet  gabapentin (NEURONTIN) 100 MG capsule  pantoprazole (PROTONIX) 40 MG tablet   No Alcohol for 24 hours before or after surgery.  No Smoking including e-cigarettes for 24 hours before surgery.  No chewable tobacco products for at least 6 hours before surgery.  No nicotine patches on the day of surgery.  Do not use any "recreational" drugs for at least a week (preferably 2 weeks) before your surgery.  Please be advised that the combination of cocaine and anesthesia may have negative outcomes, up to and including death. If you test positive for cocaine, your surgery will be cancelled.  On the morning of surgery brush your teeth with toothpaste and water, you may rinse your mouth with mouthwash if you wish. Do not swallow any toothpaste or mouthwash.  Use CHG Soap or wipes as directed on instruction sheet.  Do not wear lotions, powders, or perfumes.   Do not shave body hair from the neck down 48 hours before surgery.  Wear comfortable clothing (specific to your surgery type) to the hospital.  Do not wear jewelry, make-up, hairpins, clips or nail polish.  For welded (permanent) jewelry: bracelets, anklets, waist bands, etc.  Please have this removed prior to surgery.  If it is not removed, there is a chance that hospital personnel will need to cut it off on the day of surgery. Contact lenses, hearing aids and dentures may not be worn into surgery.  Do not bring  valuables to the hospital. [facility] is not responsible for any missing/lost belongings or valuables.   Notify your doctor if there is any change in your medical condition (cold, fever, infection).  If you are being discharged the day of surgery, you will not be allowed to drive home. You will need a responsible individual to drive you home and stay with you for 24 hours after surgery.   If you are  taking public transportation, you will need to have a responsible individual with you.  If you are being admitted to the hospital overnight, leave your suitcase in the car. After surgery it may be brought to your room.  In case of increased patient census, it may be necessary for you, the patient, to continue your postoperative care in the Same Day Surgery department.  After surgery, you can help prevent lung complications by doing breathing exercises.  Take deep breaths and cough every 1-2 hours. Your doctor may order a device called an Incentive Spirometer to help you take deep breaths. When coughing or sneezing, hold a pillow firmly against your incision with both hands. This is called "splinting." Doing this helps protect your incision. It also decreases belly discomfort.  Surgery Visitation Policy:  Patients undergoing a surgery or procedure may have two family members or support persons with them as long as the person is not COVID-19 positive or experiencing its symptoms.   Inpatient Visitation:    Visiting hours are 7 a.m. to 8 p.m. Up to four visitors are allowed at one time in a patient room. The visitors may rotate out with other people during the day. One designated support person (adult) may remain overnight.  Due to an increase in RSV and influenza rates and associated hospitalizations, children ages 66 and under will not be able to visit patients in [facility]. Masks continue to be strongly recommended.  Please call the Pre-admissions Testing Dept. at [phone] if you have any questions about these instructions.     Preparing for Surgery with CHLORHEXIDINE GLUCONATE (CHG) Soap  Chlorhexidine Gluconate (CHG) Soap  o An antiseptic cleaner that kills germs and bonds with the skin to continue killing germs even after washing  o Used for showering the night before surgery and morning of surgery  Before surgery, you can play an important role by reducing the  number of germs on your skin.  CHG (Chlorhexidine gluconate) soap is an antiseptic cleanser which kills germs and bonds with the skin to continue killing germs even after washing.  Please do not use if you have an allergy to CHG or antibacterial soaps. If your skin becomes reddened/irritated stop using the CHG.  1. Shower the NIGHT BEFORE SURGERY and the MORNING OF SURGERY with CHG soap.  2. If you choose to wash your hair, wash your hair first as usual with your normal shampoo.  3. After shampooing, rinse your hair and body thoroughly to remove the shampoo.  4. Use CHG as you would any other liquid soap. You can apply CHG directly to the skin and wash gently with a scrungie or a clean washcloth.  5. Apply the CHG soap to your body only from the neck down. Do not use on open wounds or open sores. Avoid contact with your eyes, ears, mouth, and genitals (private parts). Wash face and genitals (private parts) with your normal soap.  6. Wash thoroughly, paying special attention to the area where your surgery will be performed.  7. Thoroughly rinse your body  with warm water.  8. Do not shower/wash with your normal soap after using and rinsing off the CHG soap.  9. Pat yourself dry with a clean towel.  10. Wear clean pajamas to bed the night before surgery.  12. Place clean sheets on your bed the night of your first shower and do not sleep with pets.  13. Shower again with the CHG soap on the day of surgery prior to arriving at the hospital.  14. Do not apply any deodorants/lotions/powders.  15. Please wear clean clothes to the hospital.

## 2023-03-14 ENCOUNTER — Encounter: Payer: Self-pay | Admitting: Urgent Care

## 2023-03-14 ENCOUNTER — Encounter
Admission: RE | Admit: 2023-03-14 | Discharge: 2023-03-14 | Disposition: A | Discharge status: Home / Self Care | Source: Ambulatory Visit | Attending: Podiatry | Admitting: Podiatry

## 2023-03-14 DIAGNOSIS — Z01818 Encounter for other preprocedural examination: Secondary | ICD-10-CM | POA: Diagnosis present

## 2023-03-14 DIAGNOSIS — Z79899 Other long term (current) drug therapy: Secondary | ICD-10-CM | POA: Diagnosis not present

## 2023-03-14 DIAGNOSIS — E782 Mixed hyperlipidemia: Secondary | ICD-10-CM | POA: Diagnosis not present

## 2023-03-14 DIAGNOSIS — E1140 Type 2 diabetes mellitus with diabetic neuropathy, unspecified: Secondary | ICD-10-CM | POA: Insufficient documentation

## 2023-03-14 DIAGNOSIS — I10 Essential (primary) hypertension: Secondary | ICD-10-CM | POA: Insufficient documentation

## 2023-03-14 DIAGNOSIS — Z794 Long term (current) use of insulin: Secondary | ICD-10-CM | POA: Insufficient documentation

## 2023-03-14 HISTORY — DX: Rheumatoid arthritis, unspecified: M06.9

## 2023-03-14 HISTORY — DX: Plantar fascial fibromatosis: M72.2

## 2023-03-14 HISTORY — DX: Spondylosis, unspecified: M47.9

## 2023-03-14 HISTORY — DX: Squamous cell carcinoma of skin, unspecified: C44.92

## 2023-03-14 HISTORY — DX: Type 2 diabetes mellitus without complications: E11.9

## 2023-03-14 HISTORY — DX: Other urticaria: L50.8

## 2023-03-14 LAB — BASIC METABOLIC PANEL
Anion gap: 8 (ref 5–15)
BUN: 53 mg/dL — ABNORMAL HIGH (ref 8–23)
CO2: 24 mmol/L (ref 22–32)
Calcium: 9.5 mg/dL (ref 8.9–10.3)
Chloride: 105 mmol/L (ref 98–111)
Creatinine, Ser: 1.76 mg/dL — ABNORMAL HIGH (ref 0.44–1.00)
GFR, Estimated: 28 mL/min — ABNORMAL LOW (ref >60)
Glucose, Bld: 87 mg/dL (ref 70–99)
Potassium: 3.3 mmol/L — ABNORMAL LOW (ref 3.5–5.1)
Sodium: 137 mmol/L (ref 135–145)

## 2023-03-16 ENCOUNTER — Ambulatory Visit
Admission: RE | Admit: 2023-03-16 | Discharge: 2023-03-16 | Disposition: A | Discharge status: Home / Self Care | Payer: Medicare Other | Source: Ambulatory Visit | Attending: Podiatry | Admitting: Podiatry

## 2023-03-16 ENCOUNTER — Ambulatory Visit: Payer: Self-pay | Admitting: Anesthesiology

## 2023-03-16 ENCOUNTER — Ambulatory Visit

## 2023-03-16 ENCOUNTER — Other Ambulatory Visit: Payer: Self-pay

## 2023-03-16 ENCOUNTER — Encounter
Admission: RE | Disposition: A | Discharge status: Home / Self Care | Payer: Self-pay | Source: Ambulatory Visit | Attending: Podiatry

## 2023-03-16 ENCOUNTER — Encounter: Payer: Self-pay | Admitting: Podiatry

## 2023-03-16 DIAGNOSIS — I10 Essential (primary) hypertension: Secondary | ICD-10-CM | POA: Diagnosis not present

## 2023-03-16 DIAGNOSIS — E1140 Type 2 diabetes mellitus with diabetic neuropathy, unspecified: Secondary | ICD-10-CM

## 2023-03-16 DIAGNOSIS — E1152 Type 2 diabetes mellitus with diabetic peripheral angiopathy with gangrene: Secondary | ICD-10-CM | POA: Diagnosis not present

## 2023-03-16 DIAGNOSIS — K449 Diaphragmatic hernia without obstruction or gangrene: Secondary | ICD-10-CM | POA: Insufficient documentation

## 2023-03-16 DIAGNOSIS — K219 Gastro-esophageal reflux disease without esophagitis: Secondary | ICD-10-CM | POA: Insufficient documentation

## 2023-03-16 DIAGNOSIS — G4730 Sleep apnea, unspecified: Secondary | ICD-10-CM | POA: Diagnosis not present

## 2023-03-16 DIAGNOSIS — I96 Gangrene, not elsewhere classified: Secondary | ICD-10-CM | POA: Insufficient documentation

## 2023-03-16 DIAGNOSIS — G709 Myoneural disorder, unspecified: Secondary | ICD-10-CM | POA: Diagnosis not present

## 2023-03-16 DIAGNOSIS — Z794 Long term (current) use of insulin: Secondary | ICD-10-CM | POA: Diagnosis not present

## 2023-03-16 HISTORY — PX: AMPUTATION TOE: SHX6595

## 2023-03-16 LAB — GLUCOSE, CAPILLARY
Glucose-Capillary: 109 mg/dL — ABNORMAL HIGH (ref 70–99)
Glucose-Capillary: 129 mg/dL — ABNORMAL HIGH (ref 70–99)

## 2023-03-16 SURGERY — AMPUTATION, TOE
Anesthesia: General | Site: Toe | Laterality: Right

## 2023-03-16 MED ORDER — TRAMADOL HCL 50 MG PO TABS: 50.0000 mg | ORAL_TABLET | Freq: Four times a day (QID) | ORAL | 0 refills | Status: DC | PRN

## 2023-03-16 MED ORDER — FENTANYL CITRATE (PF) 100 MCG/2ML IJ SOLN: 25.0000 ug | INTRAMUSCULAR | Status: DC | PRN

## 2023-03-16 MED ORDER — BUPIVACAINE HCL (PF) 0.25 % IJ SOLN
INTRAMUSCULAR | Status: DC | PRN
  Administered 2023-03-16: 5 mL

## 2023-03-16 MED ORDER — PHENYLEPHRINE 80 MCG/ML (10ML) SYRINGE FOR IV PUSH (FOR BLOOD PRESSURE SUPPORT)
PREFILLED_SYRINGE | INTRAVENOUS | Status: DC | PRN
  Administered 2023-03-16: 80 ug via INTRAVENOUS

## 2023-03-16 MED ORDER — BUPIVACAINE HCL (PF) 0.25 % IJ SOLN
INTRAMUSCULAR | Status: AC
  Filled 2023-03-16: qty 30

## 2023-03-16 MED ORDER — OXYCODONE HCL 5 MG PO TABS: 5.0000 mg | ORAL_TABLET | Freq: Once | ORAL | Status: DC | PRN

## 2023-03-16 MED ORDER — CEFAZOLIN SODIUM-DEXTROSE 2-4 GM/100ML-% IV SOLN
2.0000 g | INTRAVENOUS | Status: AC
  Administered 2023-03-16: 2 g via INTRAVENOUS

## 2023-03-16 MED ORDER — EPHEDRINE SULFATE-NACL 50-0.9 MG/10ML-% IV SOSY
PREFILLED_SYRINGE | INTRAVENOUS | Status: DC | PRN
  Administered 2023-03-16 (×2): 10 mg via INTRAVENOUS
  Administered 2023-03-16: 5 mg via INTRAVENOUS

## 2023-03-16 MED ORDER — ACETAMINOPHEN 10 MG/ML IV SOLN
INTRAVENOUS | Status: DC | PRN
  Administered 2023-03-16: 1000 mg via INTRAVENOUS

## 2023-03-16 MED ORDER — METOCLOPRAMIDE HCL 10 MG PO TABS: 5.0000 mg | ORAL_TABLET | Freq: Three times a day (TID) | ORAL | Status: DC | PRN

## 2023-03-16 MED ORDER — CEFAZOLIN SODIUM-DEXTROSE 2-4 GM/100ML-% IV SOLN
INTRAVENOUS | Status: AC
  Filled 2023-03-16: qty 100

## 2023-03-16 MED ORDER — CHLORHEXIDINE GLUCONATE 0.12 % MT SOLN
OROMUCOSAL | Status: AC
  Filled 2023-03-16: qty 15

## 2023-03-16 MED ORDER — FENTANYL CITRATE (PF) 100 MCG/2ML IJ SOLN
INTRAMUSCULAR | Status: DC | PRN
  Administered 2023-03-16 (×2): 25 ug via INTRAVENOUS

## 2023-03-16 MED ORDER — OXYCODONE HCL 5 MG/5ML PO SOLN: 5.0000 mg | Freq: Once | ORAL | Status: DC | PRN

## 2023-03-16 MED ORDER — ONDANSETRON HCL 4 MG/2ML IJ SOLN: 4.0000 mg | Freq: Four times a day (QID) | INTRAMUSCULAR | Status: DC | PRN

## 2023-03-16 MED ORDER — METOCLOPRAMIDE HCL 5 MG/ML IJ SOLN: 5.0000 mg | Freq: Three times a day (TID) | INTRAMUSCULAR | Status: DC | PRN

## 2023-03-16 MED ORDER — PROPOFOL 500 MG/50ML IV EMUL
INTRAVENOUS | Status: DC | PRN
  Administered 2023-03-16: 150 ug/kg/min via INTRAVENOUS

## 2023-03-16 MED ORDER — SODIUM CHLORIDE 0.9 % IV SOLN: INTRAVENOUS | Status: DC

## 2023-03-16 MED ORDER — LIDOCAINE HCL (PF) 1 % IJ SOLN
INTRAMUSCULAR | Status: AC
  Filled 2023-03-16: qty 30

## 2023-03-16 MED ORDER — ONDANSETRON HCL 4 MG PO TABS: 4.0000 mg | ORAL_TABLET | Freq: Four times a day (QID) | ORAL | Status: DC | PRN

## 2023-03-16 MED ORDER — ORAL CARE MOUTH RINSE: 15.0000 mL | Freq: Once | OROMUCOSAL | Status: AC

## 2023-03-16 MED ORDER — PROPOFOL 1000 MG/100ML IV EMUL
INTRAVENOUS | Status: AC
  Filled 2023-03-16: qty 100

## 2023-03-16 MED ORDER — ONDANSETRON HCL 4 MG/2ML IJ SOLN
INTRAMUSCULAR | Status: DC | PRN
  Administered 2023-03-16: 4 mg via INTRAVENOUS

## 2023-03-16 MED ORDER — LIDOCAINE HCL (PF) 1 % IJ SOLN
INTRAMUSCULAR | Status: DC | PRN
  Administered 2023-03-16: 5 mL

## 2023-03-16 MED ORDER — CHLORHEXIDINE GLUCONATE 0.12 % MT SOLN
15.0000 mL | Freq: Once | OROMUCOSAL | Status: AC
  Administered 2023-03-16: 15 mL via OROMUCOSAL

## 2023-03-16 MED ORDER — LIDOCAINE HCL (CARDIAC) PF 100 MG/5ML IV SOSY
PREFILLED_SYRINGE | INTRAVENOUS | Status: DC | PRN
  Administered 2023-03-16: 50 mg via INTRAVENOUS

## 2023-03-16 MED ORDER — FENTANYL CITRATE (PF) 100 MCG/2ML IJ SOLN
INTRAMUSCULAR | Status: AC
  Filled 2023-03-16: qty 2

## 2023-03-16 SURGICAL SUPPLY — 45 items
BLADE OSC/SAGITTAL MD 5.5X18 (BLADE) ×1 IMPLANT
BLADE SURG MINI STRL (BLADE) ×1 IMPLANT
BNDG COHESIVE 4X5 TAN STRL LF (GAUZE/BANDAGES/DRESSINGS) IMPLANT
BNDG ELASTIC 4X5.8 VLCR NS LF (GAUZE/BANDAGES/DRESSINGS) ×1 IMPLANT
BNDG ESMARCH 4X12 STRL LF (GAUZE/BANDAGES/DRESSINGS) ×1 IMPLANT
BNDG GAUZE DERMACEA FLUFF 4 (GAUZE/BANDAGES/DRESSINGS) ×1 IMPLANT
BNDG STRETCH GAUZE 3IN X12FT (GAUZE/BANDAGES/DRESSINGS) ×2 IMPLANT
CUFF TOURN SGL QUICK 12 (TOURNIQUET CUFF) IMPLANT
CUFF TOURN SGL QUICK 18X4 (TOURNIQUET CUFF) IMPLANT
DRAPE FLUOR MINI C-ARM 54X84 (DRAPES) ×1 IMPLANT
DRAPE XRAY CASSETTE 23X24 (DRAPES) ×1 IMPLANT
DURAPREP 26ML APPLICATOR (WOUND CARE) ×1 IMPLANT
ELECT REM PT RETURN 9FT ADLT (ELECTROSURGICAL) ×1 IMPLANT
ELECTRODE REM PT RTRN 9FT ADLT (ELECTROSURGICAL) ×1 IMPLANT
GAUZE PACKING IODOFORM 1/2INX (GAUZE/BANDAGES/DRESSINGS) ×1 IMPLANT
GAUZE SPONGE 4X4 12PLY STRL (GAUZE/BANDAGES/DRESSINGS) ×1 IMPLANT
GAUZE STRETCH 2X75IN STRL (MISCELLANEOUS) ×1 IMPLANT
GAUZE XEROFORM 1X8 LF (GAUZE/BANDAGES/DRESSINGS) ×1 IMPLANT
GLOVE BIO SURGEON STRL SZ7.5 (GLOVE) ×1 IMPLANT
GLOVE INDICATOR 8.0 STRL GRN (GLOVE) ×1 IMPLANT
GOWN STRL REUS W/ TWL XL LVL3 (GOWN DISPOSABLE) ×1 IMPLANT
GOWN STRL REUS W/TWL XL LVL4 (GOWN DISPOSABLE) ×1 IMPLANT
IV NS IRRIG 3000ML ARTHROMATIC (IV SOLUTION) ×1 IMPLANT
KIT TURNOVER KIT A (KITS) ×1 IMPLANT
LABEL OR SOLS (LABEL) ×1 IMPLANT
MANIFOLD NEPTUNE II (INSTRUMENTS) ×1 IMPLANT
NDL FILTER BLUNT 18X1 1/2 (NEEDLE) ×1 IMPLANT
NDL HYPO 25X1 1.5 SAFETY (NEEDLE) ×1 IMPLANT
NEEDLE FILTER BLUNT 18X1 1/2 (NEEDLE) IMPLANT
NEEDLE HYPO 25X1 1.5 SAFETY (NEEDLE) ×1 IMPLANT
NS IRRIG 500ML POUR BTL (IV SOLUTION) ×1 IMPLANT
PACK EXTREMITY ARMC (MISCELLANEOUS) ×1 IMPLANT
PAD ABD DERMACEA PRESS 5X9 (GAUZE/BANDAGES/DRESSINGS) ×2 IMPLANT
PULSAVAC PLUS IRRIG FAN TIP (DISPOSABLE) IMPLANT
SHIELD FULL FACE ANTIFOG 7M (MISCELLANEOUS) ×1 IMPLANT
STOCKINETTE M/LG 89821 (MISCELLANEOUS) ×1 IMPLANT
STRAP SAFETY 5IN WIDE (MISCELLANEOUS) ×1 IMPLANT
SUT ETHILON 3-0 FS-10 30 BLK (SUTURE) ×2 IMPLANT
SUT ETHILON 5-0 FS-2 18 BLK (SUTURE) ×1 IMPLANT
SUT VIC AB 3-0 SH 27X BRD (SUTURE) IMPLANT
SUTURE EHLN 3-0 FS-10 30 BLK (SUTURE) ×1 IMPLANT
SYR 10ML LL (SYRINGE) ×3 IMPLANT
TIP FAN IRRIG PULSAVAC PLUS (DISPOSABLE) ×1 IMPLANT
TRAP FLUID SMOKE EVACUATOR (MISCELLANEOUS) ×1 IMPLANT
WATER STERILE IRR 500ML POUR (IV SOLUTION) ×1 IMPLANT

## 2023-03-16 NOTE — Anesthesia Procedure Notes (Signed)
 Procedure Name: MAC Date/Time: 03/16/2023 7:40 AM  Performed by: Cheral Bay, CRNAPre-anesthesia Checklist: Patient identified, Emergency Drugs available, Suction available, Patient being monitored and Timeout performed Patient Re-evaluated:Patient Re-evaluated prior to induction Oxygen Delivery Method: Nasal cannula Induction Type: IV induction Placement Confirmation: positive ETCO2 and CO2 detector

## 2023-03-16 NOTE — Anesthesia Preprocedure Evaluation (Signed)
 Anesthesia Evaluation  Patient identified by MRN, date of birth, ID band Patient awake    Reviewed: Allergy & Precautions, NPO status , Patient's Chart, lab work & pertinent test results  History of Anesthesia Complications (+) PONV and history of anesthetic complications  Airway Mallampati: III  TM Distance: <3 FB Neck ROM: full    Dental  (+) Missing   Pulmonary neg shortness of breath, sleep apnea    Pulmonary exam normal        Cardiovascular hypertension, (-) angina negative cardio ROS Normal cardiovascular exam     Neuro/Psych  Headaches  Neuromuscular disease  negative psych ROS   GI/Hepatic Neg liver ROS, hiatal hernia,GERD  Controlled,,  Endo/Other  negative endocrine ROSdiabetes, Type 2    Renal/GU Renal disease  negative genitourinary   Musculoskeletal   Abdominal   Peds  Hematology negative hematology ROS (+)   Anesthesia Other Findings Past Medical History: No date: B12 deficiency No date: Chronic urticaria No date: Dermatitis No date: Eczema No date: Fibrosis, pulmonary, interstitial, diffuse (HCC) No date: GERD (gastroesophageal reflux disease) No date: Headache     Comment:  migraines in the past (none since menopause) No date: Heart murmur No date: History of hiatal hernia No date: Hypertension No date: Loose stools No date: Osteoarthritis of spine No date: Plantar fasciitis No date: Pneumonia No date: PONV (postoperative nausea and vomiting) No date: Psoriasis No date: Rheumatoid arthritis (HCC) No date: Rosacea 2004: Sleep apnea     Comment:  does not use cpap No date: Squamous cell skin cancer No date: T2DM (type 2 diabetes mellitus) (HCC) 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 followed by Dr Leonette Monarch  No date: Varicose veins  Past Surgical History: 07/25/2022: ABDOMINAL AORTOGRAM W/LOWER EXTREMITY; Left     Comment:  Procedure: ABDOMINAL AORTOGRAM W/LOWER  EXTREMITY;                Surgeon: Renford Dills, MD;  Location: [facility] INVASIVE              CV LAB;  Service: Cardiovascular;  Laterality: Left; 08/20/2017: ANTERIOR CERVICAL DECOMP/DISCECTOMY FUSION; N/A     Comment:  Procedure: Anterior Cervical Decompression Fusion -               Cervical Four - Cervical Five - Cervical Five - Cervical               Six;  Surgeon: Julio Sicks, MD;  Location: [facility] OR;                Service: Neurosurgery;  Laterality: N/A;  Anterior               Cervical Decompression Fusion - Cervical Four - Cervical               Five - Cervical Five - Cervical Six No date: APPENDECTOMY No date: BILATERAL CARPAL TUNNEL RELEASE 04/24/2022: BREAST BIOPSY; Left     Comment:  [place] Bx, 6:00 heart clip, SUBAREOLAR BREAST TISSUE WITH               RUPTURED DUCT ECTASIA. 04/24/2022: BREAST BIOPSY; Left     Comment:  [place] bx, 4:00 coil clip, SUBAREOLAR BREAST TISSUE WITH               AREAS OF STROMAL FIBROSIS. 04/24/2022: BREAST BIOPSY; Left     Comment:  [place] LT BREAST BX W LOC DEV 1ST LESION IMG BX SPEC  [place]               GUIDE 04/24/2022 [facility] 04/24/2022: BREAST BIOPSY; Left     Comment:  [place] LT BREAST BX W LOC DEV EA ADD LESION IMG BX SPEC [place]               GUIDE 04/24/2022 [facility] 06/19/2022: BREAST BIOPSY WITH RADIO FREQUENCY LOCALIZER; Left     Comment:  Procedure: BREAST BIOPSY WITH RADIO FREQUENCY LOCALIZER;              Surgeon: Carolan Shiver, MD;  Location: [facility] ORS;               Service: General;  Laterality: Left; No date: BREAST CYST ASPIRATION; Left     Comment:  neg No date: CATARACT EXTRACTION; Bilateral 1963: CHOLECYSTECTOMY 12/31/2015: COLONOSCOPY WITH PROPOFOL; N/A     Comment:  Procedure: COLONOSCOPY WITH PROPOFOL;  Surgeon: Christena Deem, MD;  Location: [facility] ENDOSCOPY;  Service:               Endoscopy;  Laterality: N/A; 1991: ENDOMETRIAL ABLATION 12/31/2015: ESOPHAGOGASTRODUODENOSCOPY (EGD) WITH PROPOFOL;  N/A     Comment:  Procedure: ESOPHAGOGASTRODUODENOSCOPY (EGD) WITH               PROPOFOL;  Surgeon: Christena Deem, MD;  Location:               [facility] ENDOSCOPY;  Service: Endoscopy;  Laterality: N/A; 06/02/2016: ESOPHAGOGASTRODUODENOSCOPY (EGD) WITH PROPOFOL; N/A     Comment:  Procedure: ESOPHAGOGASTRODUODENOSCOPY (EGD) WITH               PROPOFOL;  Surgeon: Christena Deem, MD;  Location:               [facility] ENDOSCOPY;  Service: Endoscopy;  Laterality: N/A; 11/07/2016: ESOPHAGOGASTRODUODENOSCOPY (EGD) WITH PROPOFOL; N/A     Comment:  Procedure: ESOPHAGOGASTRODUODENOSCOPY (EGD) WITH               PROPOFOL;  Surgeon: Christena Deem, MD;  Location:               [facility] ENDOSCOPY;  Service: Endoscopy;  Laterality: N/A; 2006 and 2012: EYE SURGERY; Bilateral     Comment:  cataract surgery with lens implant 2014: eyelid surgery; Bilateral No date: FOOT SURGERY; Right     Comment:  ligament and spurs 03/07/2023: LOWER EXTREMITY ANGIOGRAPHY; Right     Comment:  Procedure: Lower Extremity Angiography;  Surgeon: Annice Needy, MD;  Location: [facility] INVASIVE CV LAB;  Service:               Cardiovascular;  Laterality: Right; 12/24/2013: LUMBAR LAMINECTOMY/DECOMPRESSION MICRODISCECTOMY; Right     Comment:  Procedure: RIGHT LUMBAR THREE TO FOUR, LUMBAR FOUR TO               FIVE, LUMBAR FIVE TO SACRAL ONE LAMINECTOMY/FORAMINOTOMY;              Surgeon: Karn Cassis, MD;  Location: [facility] NEURO ORS;                Service: Neurosurgery;  Laterality: Right;  RIGHT L3-4               L4-5 L5-S1 LAMINECTOMY/FORAMINOTOMY 2004: NASAL SEPTUM SURGERY No date: SHOULDER SURGERY; Right     Comment:  for a frozen shoulder No date: SKIN CANCER EXCISION; Right     Comment:  leg x 4 No date: skin cancer removal No date: TONSILLECTOMY 1968: TUBAL LIGATION  BMI    Body Mass Index: 26.00 kg/m      Reproductive/Obstetrics negative OB ROS                              Anesthesia Physical Anesthesia Plan  ASA: 3  Anesthesia Plan: General   Post-op Pain Management:    Induction: Intravenous  PONV Risk Score and Plan: Propofol infusion and TIVA  Airway Management Planned: Natural Airway and Nasal Cannula  Additional Equipment:   Intra-op Plan:   Post-operative Plan:   Informed Consent: I have reviewed the patients History and Physical, chart, labs and discussed the procedure including the risks, benefits and alternatives for the proposed anesthesia with the patient or authorized representative who has indicated his/her understanding and acceptance.     Dental Advisory Given  Plan Discussed with: Anesthesiologist, CRNA and Surgeon  Anesthesia Plan Comments: (Patient consented for risks of anesthesia including but not limited to:  - adverse reactions to medications - risk of airway placement if required - damage to eyes, teeth, lips or other oral mucosa - nerve damage due to positioning  - sore throat or hoarseness - Damage to heart, brain, nerves, lungs, other parts of body or loss of life  Patient voiced understanding and assent.)       Anesthesia Quick Evaluation

## 2023-03-16 NOTE — Discharge Instructions (Signed)

## 2023-03-16 NOTE — Anesthesia Postprocedure Evaluation (Signed)
 Anesthesia Post Note  Patient: Courtney [name]  Procedure(s) Performed: AMPUTATION TOE IPJ X 2 Great toe and second toe (Right: Toe)  Patient location during evaluation: PACU Anesthesia Type: General Level of consciousness: awake and alert Pain management: pain level controlled Vital Signs Assessment: post-procedure vital signs reviewed and stable Respiratory status: spontaneous breathing, nonlabored ventilation, respiratory function stable and patient connected to nasal cannula oxygen Cardiovascular status: blood pressure returned to baseline and stable Postop Assessment: no apparent nausea or vomiting Anesthetic complications: no   No notable events documented.   Last Vitals:  Vitals:   03/16/23 0830 03/16/23 0845  BP: 117/80   Pulse: 62   Resp: (!) 21   Temp: (!) 36.1 C (!) 36.1 C  SpO2: 97%     Last Pain:  Vitals:   03/16/23 0845  TempSrc:   PainSc: 0-No pain                 Cleda Mccreedy Cathy Ropp

## 2023-03-16 NOTE — Op Note (Signed)
 Operative note   Surgeon:Nohemy Koop [employer] forces logistics/support/administrative officer: None    Preop diagnosis: 1.  Gangrene distal right great toe 2.  Gangrene distal right second toe    Postop diagnosis: Same    Procedure: 1.  Partial amputation right great toe 2.  Partial amputation right second toe    EBL: Minimal    Anesthesia:local and IV sedation local consists of a one-to-one mixture of 0.25% bupivacaine and 1% lidocaine plain.  A total of 10 cc was used    Hemostasis: None    Specimen: Amputation of distal right great toe and second toe with gangrene    Complications: None    Operative indications:Courtney [name] is an 83 y.o. that presents today for surgical intervention.  The risks/benefits/alternatives/complications have been discussed and consent has been given.    Procedure:  Patient was brought into the OR and placed on the operating table in thesupine position. After anesthesia was obtained theright lower extremity was prepped and draped in usual sterile fashion.  Attention was directed to the distal aspect of the right great toe at the level of the interphalangeal joint where a fishmouth incision was performed at the interphalangeal joint region.  Full-thickness flaps were created.  The distal phalanx and gangrenous toe were removed from the surgical field in toto.  At this time a osteotomy was created just proximal to the distal aspect of the proximal phalanx.  This removed the head of the proximal phalanx.  Primary closure of the wound was achieved.  The wound was flushed with copious amounts of irrigation.  Closure was performed with a 3-0 Vicryl the subcutaneous tissue and a 3-0 nylon for skin.  Attention was directed to the right second toe where a fishmouth incision was performed at the level of the PIPJ.  Full-thickness flaps were created.  Osteotomy was created through the head of the proximal phalanx.  The distal portion of the toe was then removed from the surgical field in toto with the  gangrenous changes.  The wound was flushed with copious amounts of irrigation.  Closure was then performed with a 3-0 nylon.  Good perfusion of both skin flaps were noted post closure.    Patient tolerated the procedure and anesthesia well.  Was transported from the OR to the PACU with all vital signs stable and vascular status intact. To be discharged per routine protocol.  Will follow up in approximately 1 week in the outpatient clinic.

## 2023-03-16 NOTE — H&P (Signed)
 HISTORY AND PHYSICAL INTERVAL NOTE:  03/16/2023  7:25 AM  Courtney [name]  has presented today for surgery, with the diagnosis of Gangrene.  The various methods of treatment have been discussed with the patient.  No guarantees were given.  After consideration of risks, benefits and other options for treatment, the patient has consented to surgery.  I have reviewed the patients' chart and labs.     A history and physical examination was performed in my office.  The patient was reexamined.  There have been no changes to this history and physical examination.  Gwyneth Revels A

## 2023-03-16 NOTE — Transfer of Care (Signed)
 Immediate Anesthesia Transfer of Care Note  Patient: Courtney [name]  Procedure(s) Performed: AMPUTATION TOE IPJ X 2 Great toe and second toe (Right: Toe)  Patient Location: PACU  Anesthesia Type:General  Level of Consciousness: awake  Airway & Oxygen Therapy: Patient Spontanous Breathing  Post-op Assessment: Report given to RN and Post -op Vital signs reviewed and stable  Post vital signs: Reviewed and stable  Last Vitals:  Vitals Value Taken Time  BP 132/47 03/16/23 0819  Temp    Pulse 62 03/16/23 0822  Resp 14 03/16/23 0822  SpO2 99 % 03/16/23 0822  Vitals shown include unfiled device data.  Last Pain:  Vitals:   03/16/23 0614  TempSrc: Tympanic  PainSc: 0-No pain         Complications: No notable events documented.

## 2023-03-17 ENCOUNTER — Encounter: Payer: Self-pay | Admitting: Podiatry

## 2023-03-19 LAB — SURGICAL PATHOLOGY

## 2023-03-28 ENCOUNTER — Ambulatory Visit (INDEPENDENT_AMBULATORY_CARE_PROVIDER_SITE_OTHER): Payer: BLUE CROSS/BLUE SHIELD | Admitting: Nurse Practitioner

## 2023-03-28 ENCOUNTER — Encounter (INDEPENDENT_AMBULATORY_CARE_PROVIDER_SITE_OTHER): Payer: Medicare Other

## 2023-04-10 ENCOUNTER — Other Ambulatory Visit (INDEPENDENT_AMBULATORY_CARE_PROVIDER_SITE_OTHER): Payer: Self-pay | Admitting: Vascular Surgery

## 2023-04-10 DIAGNOSIS — Z9889 Other specified postprocedural states: Secondary | ICD-10-CM

## 2023-04-11 ENCOUNTER — Ambulatory Visit (INDEPENDENT_AMBULATORY_CARE_PROVIDER_SITE_OTHER)

## 2023-04-11 DIAGNOSIS — I739 Peripheral vascular disease, unspecified: Secondary | ICD-10-CM

## 2023-04-11 DIAGNOSIS — Z9889 Other specified postprocedural states: Secondary | ICD-10-CM | POA: Diagnosis not present

## 2023-04-12 ENCOUNTER — Ambulatory Visit (INDEPENDENT_AMBULATORY_CARE_PROVIDER_SITE_OTHER): Admitting: Nurse Practitioner

## 2023-04-12 ENCOUNTER — Encounter (INDEPENDENT_AMBULATORY_CARE_PROVIDER_SITE_OTHER): Payer: Self-pay | Admitting: Nurse Practitioner

## 2023-04-12 VITALS — BP 134/81 | HR 73 | Resp 18 | Ht 67.0 in | Wt 166.0 lb

## 2023-04-12 DIAGNOSIS — E1140 Type 2 diabetes mellitus with diabetic neuropathy, unspecified: Secondary | ICD-10-CM

## 2023-04-12 DIAGNOSIS — I739 Peripheral vascular disease, unspecified: Secondary | ICD-10-CM

## 2023-04-12 DIAGNOSIS — Z794 Long term (current) use of insulin: Secondary | ICD-10-CM

## 2023-04-12 DIAGNOSIS — I96 Gangrene, not elsewhere classified: Secondary | ICD-10-CM

## 2023-04-12 DIAGNOSIS — Z9889 Other specified postprocedural states: Secondary | ICD-10-CM | POA: Diagnosis not present

## 2023-04-12 DIAGNOSIS — I10 Essential (primary) hypertension: Secondary | ICD-10-CM | POA: Diagnosis not present

## 2023-04-12 LAB — VAS US ABI WITH/WO TBI

## 2023-04-15 NOTE — Progress Notes (Signed)
 Subjective:    Patient ID: Courtney [name], female    DOB: May 16, 1940, 83 y.o.   MRN: [id number] Chief Complaint  Patient presents with   Follow-up    fu 4 weeks + ABI  post angi    The patient returns to the office for followup and review status post angiogram with intervention on 03/07/2023.   Procedure: Procedure(s) Performed:             1.  Ultrasound guidance for vascular access left femoral artery             2.  Catheter placement into right common femoral artery from left femoral approach             3.  Aortogram and selective right lower extremity angiogram             4.  Percutaneous transluminal angioplasty of right anterior tibial artery in the distal segment with 2 and 2-1/2 mm diameter angioplasty balloons             5.  StarClose closure device left femoral artery   The patient notes improvement in the lower extremity symptoms. No interval shortening of the patient's claudication distance or rest pain symptoms. No new ulcers or wounds have occurred since the last visit.  The patient is concerned about ongoing diseases especially in the left lower extremity.  There have been no significant changes to the patient's overall health care.  No documented history of amaurosis fugax or recent TIA symptoms. There are no recent neurological changes noted. No documented history of DVT, PE or superficial thrombophlebitis. The patient denies recent episodes of angina or shortness of breath.   ABI's Rt=[place] and Lt=[place]  (previous ABI's Rt=[place] and Lt=) currently the left TBI 0.34. Duplex US of the tibial vessels shows the patient has one-vessel runoff through the anterior tibial artery.    Review of Systems  Skin:  Positive for wound.  Neurological:  Positive for weakness.  All other systems reviewed and are negative.      Objective:   Physical Exam Vitals reviewed.  HENT:     Head: Normocephalic.  Cardiovascular:     Rate and Rhythm: Normal rate.  Pulmonary:      Effort: Pulmonary effort is normal.  Skin:    General: Skin is warm and dry.  Neurological:     Mental Status: She is alert and oriented to person, place, and time.  Psychiatric:        Mood and Affect: Mood normal.        Behavior: Behavior normal.        Thought Content: Thought content normal.        Judgment: Judgment normal.     BP 134/81   Pulse 73   Resp 18   Ht 5\' 7"  (1.702 m)   Wt 166 lb (75.3 kg)   BMI 26.00 kg/m   Past Medical History:  Diagnosis Date   B12 deficiency    Chronic urticaria    Dermatitis    Eczema    Fibrosis, pulmonary, interstitial, diffuse (HCC)    GERD (gastroesophageal reflux disease)    Headache    migraines in the past (none since menopause)   Heart murmur    History of hiatal hernia    Hypertension    Loose stools    Osteoarthritis of spine    Plantar fasciitis    Pneumonia    PONV (postoperative nausea and vomiting)    Psoriasis  Rheumatoid arthritis (HCC)    Rosacea    Sleep apnea 2004   does not use cpap   Squamous cell skin cancer    T2DM (type 2 diabetes mellitus) (HCC)    Urethral stenosis    w/ bladder polyps. followed by Dr Leonette Monarch    Varicose veins     Social History   Socioeconomic History   Marital status: Married    Spouse name: glenn   Number of children: Not on file   Years of education: Not on file   Highest education level: Not on file  Occupational History   Not on file  Tobacco Use   Smoking status: Never   Smokeless tobacco: Never  Vaping Use   Vaping status: Never Used  Substance and Sexual Activity   Alcohol use: No   Drug use: No   Sexual activity: Not on file  Other Topics Concern   Not on file  Social History Narrative   Not on file   Social Drivers of Health   Financial Resource Strain: Low Risk  (02/01/2023)   Received from [facility]   Overall Financial Resource Strain (CARDIA)    Difficulty of Paying Living Expenses: Not very hard  Food Insecurity: No Food  Insecurity (02/01/2023)   Received from [facility]   Hunger Vital Sign    Worried About Running Out of Food in the Last Year: Never true    Ran Out of Food in the Last Year: Never true  Transportation Needs: No Transportation Needs (02/01/2023)   Received from [facility] Transportation    In the past 12 months, has lack of transportation kept you from medical appointments or from getting medications?: No    Lack of Transportation (Non-Medical): No  Physical Activity: Not on file  Stress: Not on file  Social Connections: Not on file  Intimate Partner Violence: Not At Risk (07/25/2022)   Humiliation, Afraid, Rape, and Kick questionnaire    Fear of Current or Ex-Partner: No    Emotionally Abused: No    Physically Abused: No    Sexually Abused: No    Past Surgical History:  Procedure Laterality Date   ABDOMINAL AORTOGRAM W/LOWER EXTREMITY Left 07/25/2022   Procedure: ABDOMINAL AORTOGRAM W/LOWER EXTREMITY;  Surgeon: Renford Dills, MD;  Location: [facility] INVASIVE CV LAB;  Service: Cardiovascular;  Laterality: Left;   AMPUTATION TOE Right 03/16/2023   Procedure: AMPUTATION TOE IPJ X 2 Great toe and second toe;  Surgeon: Gwyneth Revels, DPM;  Location: [facility] ORS;  Service: Orthopedics/Podiatry;  Laterality: Right;   ANTERIOR CERVICAL DECOMP/DISCECTOMY FUSION N/A 08/20/2017   Procedure: Anterior Cervical Decompression Fusion - Cervical Four - Cervical Five - Cervical Five - Cervical Six;  Surgeon: Julio Sicks, MD;  Location: [facility] OR;  Service: Neurosurgery;  Laterality: N/A;  Anterior Cervical Decompression Fusion - Cervical Four - Cervical Five - Cervical Five - Cervical Six   APPENDECTOMY     BILATERAL CARPAL TUNNEL RELEASE     BREAST BIOPSY Left 04/24/2022   [place] Bx, 6:00 heart clip, SUBAREOLAR BREAST TISSUE WITH RUPTURED DUCT ECTASIA.   BREAST BIOPSY Left 04/24/2022   [place] bx, 4:00 coil clip, SUBAREOLAR BREAST TISSUE WITH AREAS OF STROMAL FIBROSIS.    BREAST BIOPSY Left 04/24/2022   [place] LT BREAST BX W LOC DEV 1ST LESION IMG BX SPEC US GUIDE 04/24/2022 [facility]   BREAST BIOPSY Left 04/24/2022   [place] LT BREAST BX W LOC DEV EA  ADD LESION IMG BX SPEC US GUIDE 04/24/2022 [facility]   BREAST BIOPSY WITH RADIO FREQUENCY LOCALIZER Left 06/19/2022   Procedure: BREAST BIOPSY WITH RADIO FREQUENCY LOCALIZER;  Surgeon: Carolan Shiver, MD;  Location: [facility] ORS;  Service: General;  Laterality: Left;   BREAST CYST ASPIRATION Left    neg   CATARACT EXTRACTION Bilateral    CHOLECYSTECTOMY  1963   COLONOSCOPY WITH PROPOFOL N/A 12/31/2015   Procedure: COLONOSCOPY WITH PROPOFOL;  Surgeon: Christena Deem, MD;  Location: [facility] ENDOSCOPY;  Service: Endoscopy;  Laterality: N/A;   ENDOMETRIAL ABLATION  1991   ESOPHAGOGASTRODUODENOSCOPY (EGD) WITH PROPOFOL N/A 12/31/2015   Procedure: ESOPHAGOGASTRODUODENOSCOPY (EGD) WITH PROPOFOL;  Surgeon: Christena Deem, MD;  Location: [facility]OSCOPY;  Service: Endoscopy;  Laterality: N/A;   ESOPHAGOGASTRODUODENOSCOPY (EGD) WITH PROPOFOL N/A 06/02/2016   Procedure: ESOPHAGOGASTRODUODENOSCOPY (EGD) WITH PROPOFOL;  Surgeon: Christena Deem, MD;  Location: [facility] ENDOSCOPY;  Service: Endoscopy;  Laterality: N/A;   ESOPHAGOGASTRODUODENOSCOPY (EGD) WITH PROPOFOL N/A 11/07/2016   Procedure: ESOPHAGOGASTRODUODENOSCOPY (EGD) WITH PROPOFOL;  Surgeon: Christena Deem, MD;  Location: [facility] ENDOSCOPY;  Service: Endoscopy;  Laterality: N/A;   EYE SURGERY Bilateral 2006 and 2012   cataract surgery with lens implant   eyelid surgery Bilateral 2014   FOOT SURGERY Right    ligament and spurs   LOWER EXTREMITY ANGIOGRAPHY Right 03/07/2023   Procedure: Lower Extremity Angiography;  Surgeon: Annice Needy, MD;  Location: [facility] INVASIVE CV LAB;  Service: Cardiovascular;  Laterality: Right;   LUMBAR LAMINECTOMY/DECOMPRESSION MICRODISCECTOMY Right 12/24/2013   Procedure: RIGHT LUMBAR THREE TO FOUR, LUMBAR FOUR TO FIVE, LUMBAR  FIVE TO SACRAL ONE LAMINECTOMY/FORAMINOTOMY;  Surgeon: Karn Cassis, MD;  Location: [facility] NEURO ORS;  Service: Neurosurgery;  Laterality: Right;  RIGHT L3-4 L4-5 L5-S1 LAMINECTOMY/FORAMINOTOMY   NASAL SEPTUM SURGERY  2004   SHOULDER SURGERY Right    for a frozen shoulder   SKIN CANCER EXCISION Right    leg x 4   skin cancer removal     TONSILLECTOMY     TUBAL LIGATION  1968    Family History  Problem Relation Age of Onset   COPD Mother    Arthritis/Rheumatoid Mother    Alzheimer's disease Father    Heart attack Father    Breast cancer Paternal Aunt 57    Allergies  Allergen Reactions   Methotrexate Derivatives Shortness Of Breath    Breathing difficulties    Acyclovir And Related Rash   Codeine Nausea Only   Contrast Media [Iodinated Contrast Media] Itching and Rash   Hydroxychloroquine Rash   Inderide [Propranolol-Hctz] Rash   Lodine [Etodolac] Rash   Metrizamide Itching and Rash   Procardia [Nifedipine] Rash   Propranolol Hcl Rash       Latest Ref Rng & Units 07/27/2022    3:35 AM 07/25/2022   12:35 AM 07/24/2022    3:02 PM  CBC  WBC 4.0 - 10.5 K/uL 5.1  6.5  8.7   Hemoglobin 12.0 - 15.0 g/dL 16.1  09.6  04.5   Hematocrit 36.0 - 46.0 % 31.5  36.0  38.4   Platelets 150 - 400 K/uL 93  120  148       CMP     Component Value Date/Time   NA 137 03/14/2023 1441   K 3.3 (L) 03/14/2023 1441   CL 105 03/14/2023 1441   CO2 24 03/14/2023 1441   GLUCOSE 87 03/14/2023 1441   BUN 53 (H) 03/14/2023 1441   CREATININE 1.76 (H) 03/14/2023 1441  CALCIUM 9.5 03/14/2023 1441   PROT 7.1 07/24/2022 1502   ALBUMIN 3.2 (L) 07/24/2022 1502   AST 21 07/24/2022 1502   ALT 14 07/24/2022 1502   ALKPHOS 120 07/24/2022 1502   BILITOT 0.6 07/24/2022 1502   GFRNONAA 28 (L) 03/14/2023 1441     VAS [place] ABI WITH/WO TBI Result Date: 04/12/2023  LOWER EXTREMITY DOPPLER STUDY Patient Name:  Courtney [name]  Date of Exam:   04/11/2023 Medical Rec #: [id number]        Accession #:     [id number] Date of Birth: 03-23-1940        Patient Gender: F Patient Age:   50 years Exam Location:  [facility] Procedure:      VAS [place] ABI WITH/WO TBI Referring Phys: --------------------------------------------------------------------------------  Indications: Peripheral artery disease. High Risk Factors: Hypertension, hyperlipidemia.  Vascular Interventions: 03/06/2023 Aortogram and selective right lower extremity                         angiogram                         4. Percutaneous transluminal angioplasty of right                         anterior tibial artery in the distal segment with 2 and                         2-1/2 mm diameter angioplasty balloons. Performing Technologist: Salvadore Farber RVT  Examination Guidelines: A complete evaluation includes at minimum, Doppler waveform signals and systolic blood pressure reading at the level of bilateral brachial, anterior tibial, and posterior tibial arteries, when vessel segments are accessible. Bilateral testing is considered an integral part of a complete examination. Photoelectric Plethysmograph (PPG) waveforms and toe systolic pressure readings are included as required and additional duplex testing as needed. Limited examinations for reoccurring indications may be performed as noted.  ABI Findings: +--------+------------------+-----+----------+---------+ Right   Rt Pressure (mmHg)IndexWaveform  Comment   +--------+------------------+-----+----------+---------+ [id number]                                        +--------+------------------+-----+----------+---------+ ATA                            triphasic NonComp   +--------+------------------+-----+----------+---------+ PTA                            monophasicNonComp   +--------+------------------+-----+----------+---------+ DP                                       amputated +--------+------------------+-----+----------+---------+  +---------+------------------+-----+----------+-------+ Left     Lt Pressure (mmHg)IndexWaveform  Comment +---------+------------------+-----+----------+-------+ Brachial                                  NonComp +---------+------------------+-----+----------+-------+ ATA                             biphasic  NonComp +---------+------------------+-----+----------+-------+ PTA  monophasicNonComp +---------+------------------+-----+----------+-------+ Great Toe77                0.34 Abnormal          +---------+------------------+-----+----------+-------+ +-------+-----------+-----------+------------+------------+ ABI/TBIToday's ABIToday's TBIPrevious ABIPrevious TBI +-------+-----------+-----------+------------+------------+ Right  NonComp    amp        NonComp     .10          +-------+-----------+-----------+------------+------------+ Left   NonComp    .34        NonComp     .20          +-------+-----------+-----------+------------+------------+  Summary: Right: Resting right ankle-brachial index indicates noncompressible right lower extremity arteries. Great toe amputated. Left: Resting left ankle-brachial index indicates noncompressible left lower extremity arteries. The left toe-brachial index is abnormal. *See table(s) above for measurements and observations.  Electronically signed by [name] MD on 04/12/2023 at 3:56:05 PM.    Final    VAS [place] ABI WITH/WO TBI Result Date: 02/19/2023  LOWER EXTREMITY DOPPLER STUDY Patient Name:  Courtney [name]  Date of Exam:   02/19/2023 Medical Rec #: [id number]        Accession #:    [id number] Date of Birth: 11/04/1940        Patient Gender: F Patient Age:   35 years Exam Location:  [facility] Procedure:      VAS [place] ABI WITH/WO TBI Referring Phys: Sheppard Plumber --------------------------------------------------------------------------------  High Risk Factors: Hypertension,  hyperlipidemia, Diabetes, no history of                    smoking.  Comparison Study: 07/25/2022 angiogram showed occluded PTA and Peroneal artery Performing Technologist: Hardie Lora RVT  Examination Guidelines: A complete evaluation includes at minimum, Doppler waveform signals and systolic blood pressure reading at the level of bilateral brachial, anterior tibial, and posterior tibial arteries, when vessel segments are accessible. Bilateral testing is considered an integral part of a complete examination. Photoelectric Plethysmograph (PPG) waveforms and toe systolic pressure readings are included as required and additional duplex testing as needed. Limited examinations for reoccurring indications may be performed as noted.  ABI Findings: +---------+------------------+-----+----------+----------------+ Right    Rt Pressure (mmHg)IndexWaveform  Comment          +---------+------------------+-----+----------+----------------+ Brachial 225                                               +---------+------------------+-----+----------+----------------+ ATA      255               1.00 monophasicNon compressible +---------+------------------+-----+----------+----------------+ PTA      96                0.38 monophasic                 +---------+------------------+-----+----------+----------------+ Great Toe26                0.10                            +---------+------------------+-----+----------+----------------+ +---------+------------------+-----+--------+----------------+ Left     Lt Pressure (mmHg)IndexWaveformComment          +---------+------------------+-----+--------+----------------+ Brachial 255                            Non compressible +---------+------------------+-----+--------+----------------+  ATA      255               1.00 biphasicNon compressible +---------+------------------+-----+--------+----------------+ PTA      148               0.58  biphasic                 +---------+------------------+-----+--------+----------------+ Great Toe52                0.20                          +---------+------------------+-----+--------+----------------+ +-------+----------------+-----------+----------------+------------+ ABI/TBIToday's ABI     Today's TBIPrevious ABI    Previous TBI +-------+----------------+-----------+----------------+------------+ Right  Non compressible0.12       Non compressible0.53         +-------+----------------+-----------+----------------+------------+ Left   Non compressible0.23       Non compressible0.49         +-------+----------------+-----------+----------------+------------+  None of the indices are valid secondary to non compressible left brachial artery. Bilateral ABIs appear essentially unchanged compared to prior study on 11/20/2022.  Summary: Right: Resting right ankle-brachial index indicates noncompressible right lower extremity arteries. The right toe-brachial index is abnormal. Left: Resting left ankle-brachial index indicates noncompressible left lower extremity arteries. The left toe-brachial index is abnormal. *See table(s) above for measurements and observations.  Electronically signed by [name] MD on 02/19/2023 at 4:07:20 PM.    Final        Assessment & Plan:   1. Peripheral arterial disease with history of revascularization (HCC) (Primary) Recommend:  The patient is status post successful angiogram with intervention.  The patient reports that the claudication symptoms and leg pain has improved.   The patient denies lifestyle limiting changes at this point in time.  The patient is concerned about possible obstruction of her anterior tibial vessel in the left lower extremity.  This is certainly reasonable given that she also has only one-vessel runoff.  Following discussion with the patient she would like to move forward with a left lower extremity angiogram.  We have  discussed the risk, benefits alternatives.  She is agreeable to proceed.  However, she would like to wait approximately 8 weeks to allow for her recent amputation site to heal a little bit more before undergoing angiogram.  Will have the patient follow-up postintervention.  2. Essential hypertension Continue antihypertensive medications as already ordered, these medications have been reviewed and there are no changes at this time.  3. Controlled type 2 diabetes mellitus with diabetic neuropathy, with long-term current use of insulin (HCC) Continue hypoglycemic medications as already ordered, these medications have been reviewed and there are no changes at this time.  Hgb A1C to be monitored as already arranged by primary service  4. Gangrene of toe of right foot (HCC) Currently healing well.  Will continue to follow with podiatry.   Current Outpatient Medications on File Prior to Visit  Medication Sig Dispense Refill   acetaminophen (TYLENOL) 500 MG tablet Take 500 mg by mouth every 6 (six) hours as needed (for pain.).      aspirin EC 81 MG tablet Take 81 mg by mouth daily.     bisoprolol (ZEBETA) 10 MG tablet Take 1 tablet (10 mg total) by mouth daily. 30 tablet 1   cetirizine (ZYRTEC) 10 MG tablet Take 10 mg by mouth at bedtime.      Cholecalciferol (D3) 25 MCG (1000 UT) capsule Take 1,000 Units  by mouth daily.     clopidogrel (PLAVIX) 75 MG tablet Take 1 tablet (75 mg total) by mouth daily. 30 tablet 11   famotidine (PEPCID) 40 MG tablet Take 40 mg by mouth daily.     furosemide (LASIX) 20 MG tablet Take 20 mg by mouth 2 (two) times daily.     gabapentin (NEURONTIN) 100 MG capsule Take 100 mg by mouth 2 (two) times daily.     insulin NPH Human (HUMULIN N,NOVOLIN N) 100 UNIT/ML injection Inject 0.4 mLs (40 Units total) into the skin daily before breakfast. (Patient taking differently: Inject 25 Units into the skin in the morning and at bedtime.) 10 mL 11   levofloxacin (LEVAQUIN) 500 MG  tablet Take by mouth.     montelukast (SINGULAIR) 10 MG tablet Take 10 mg by mouth daily.     olmesartan-hydrochlorothiazide (BENICAR HCT) 40-12.5 MG tablet Take 0.5 tablets by mouth daily.     pantoprazole (PROTONIX) 40 MG tablet Take 40 mg by mouth 2 (two) times daily.     polyethylene glycol (MIRALAX / GLYCOLAX) 17 g packet Take 17 g by mouth daily as needed for mild constipation. 14 each 0   rOPINIRole (REQUIP) 0.5 MG tablet Take 0.5 mg by mouth at bedtime as needed (RLS).     simvastatin (ZOCOR) 40 MG tablet Take 40 mg by mouth at bedtime.     traMADol (ULTRAM) 50 MG tablet Take 1 tablet (50 mg total) by mouth every 6 (six) hours as needed. 20 tablet 0   bacitracin ointment Apply topically 2 (two) times daily. (Patient not taking: Reported on 03/07/2023) 120 g 0   Multiple Vitamin (MULTIVITAMIN WITH MINERALS) TABS tablet Take 1 tablet by mouth daily. (Patient not taking: Reported on 11/20/2022) 30 tablet 0   No current facility-administered medications on file prior to visit.    There are no Patient Instructions on file for this visit. No follow-ups on file.   Georgiana Spinner, NP

## 2023-04-25 ENCOUNTER — Telehealth (INDEPENDENT_AMBULATORY_CARE_PROVIDER_SITE_OTHER): Payer: Self-pay

## 2023-04-25 NOTE — Telephone Encounter (Signed)
 Spoke with the patient and she is scheduled with Dr. Prescilla Brod for a LLE angio on 06/05/23 with a 6:45 am arrival time to the [facility]. Pre-procedure instructions were discussed and will be sent to Mychart and mailed.

## 2023-05-21 ENCOUNTER — Encounter (INDEPENDENT_AMBULATORY_CARE_PROVIDER_SITE_OTHER): Payer: Medicare Other

## 2023-05-21 ENCOUNTER — Ambulatory Visit (INDEPENDENT_AMBULATORY_CARE_PROVIDER_SITE_OTHER): Payer: BLUE CROSS/BLUE SHIELD | Admitting: Vascular Surgery

## 2023-05-29 ENCOUNTER — Encounter (INDEPENDENT_AMBULATORY_CARE_PROVIDER_SITE_OTHER): Payer: Self-pay

## 2023-06-05 ENCOUNTER — Encounter
Admission: RE | Disposition: A | Discharge status: Home / Self Care | Payer: Self-pay | Source: Ambulatory Visit | Attending: Vascular Surgery

## 2023-06-05 ENCOUNTER — Ambulatory Visit
Admission: RE | Admit: 2023-06-05 | Discharge: 2023-06-05 | Disposition: A | Discharge status: Home / Self Care | Source: Ambulatory Visit | Attending: Vascular Surgery | Admitting: Vascular Surgery

## 2023-06-05 ENCOUNTER — Other Ambulatory Visit: Payer: Self-pay

## 2023-06-05 ENCOUNTER — Encounter: Payer: Self-pay | Admitting: Vascular Surgery

## 2023-06-05 DIAGNOSIS — E1142 Type 2 diabetes mellitus with diabetic polyneuropathy: Secondary | ICD-10-CM | POA: Diagnosis not present

## 2023-06-05 DIAGNOSIS — E1152 Type 2 diabetes mellitus with diabetic peripheral angiopathy with gangrene: Secondary | ICD-10-CM | POA: Diagnosis not present

## 2023-06-05 DIAGNOSIS — I10 Essential (primary) hypertension: Secondary | ICD-10-CM | POA: Diagnosis not present

## 2023-06-05 DIAGNOSIS — Z89411 Acquired absence of right great toe: Secondary | ICD-10-CM | POA: Diagnosis not present

## 2023-06-05 DIAGNOSIS — Z794 Long term (current) use of insulin: Secondary | ICD-10-CM | POA: Insufficient documentation

## 2023-06-05 DIAGNOSIS — Z9889 Other specified postprocedural states: Secondary | ICD-10-CM

## 2023-06-05 DIAGNOSIS — Z89421 Acquired absence of other right toe(s): Secondary | ICD-10-CM

## 2023-06-05 DIAGNOSIS — I70223 Atherosclerosis of native arteries of extremities with rest pain, bilateral legs: Secondary | ICD-10-CM | POA: Diagnosis not present

## 2023-06-05 DIAGNOSIS — I70219 Atherosclerosis of native arteries of extremities with intermittent claudication, unspecified extremity: Secondary | ICD-10-CM

## 2023-06-05 HISTORY — PX: LOWER EXTREMITY ANGIOGRAPHY: CATH118251

## 2023-06-05 LAB — BUN: BUN: 66 mg/dL — ABNORMAL HIGH (ref 8–23)

## 2023-06-05 LAB — GLUCOSE, CAPILLARY
Glucose-Capillary: 117 mg/dL — ABNORMAL HIGH (ref 70–99)
Glucose-Capillary: 69 mg/dL — ABNORMAL LOW (ref 70–99)
Glucose-Capillary: 99 mg/dL (ref 70–99)

## 2023-06-05 LAB — CREATININE, SERUM
Creatinine, Ser: 1.43 mg/dL — ABNORMAL HIGH (ref 0.44–1.00)
GFR, Estimated: 36 mL/min — ABNORMAL LOW (ref >60)

## 2023-06-05 SURGERY — LOWER EXTREMITY ANGIOGRAPHY
Anesthesia: Moderate Sedation | Site: Leg Lower | Laterality: Left

## 2023-06-05 MED ORDER — HYDROMORPHONE HCL 1 MG/ML IJ SOLN: 1.0000 mg | Freq: Once | INTRAMUSCULAR | Status: DC | PRN

## 2023-06-05 MED ORDER — MIDAZOLAM HCL 5 MG/5ML IJ SOLN
INTRAMUSCULAR | Status: AC
  Filled 2023-06-05: qty 5

## 2023-06-05 MED ORDER — LIDOCAINE HCL (PF) 1 % IJ SOLN
INTRAMUSCULAR | Status: DC | PRN
  Administered 2023-06-05: 10 mL

## 2023-06-05 MED ORDER — CEFAZOLIN SODIUM-DEXTROSE 2-4 GM/100ML-% IV SOLN
INTRAVENOUS | Status: AC
  Filled 2023-06-05: qty 100

## 2023-06-05 MED ORDER — FENTANYL CITRATE (PF) 100 MCG/2ML IJ SOLN
INTRAMUSCULAR | Status: AC
  Filled 2023-06-05: qty 2

## 2023-06-05 MED ORDER — CEFAZOLIN SODIUM-DEXTROSE 2-4 GM/100ML-% IV SOLN
2.0000 g | INTRAVENOUS | Status: AC
  Administered 2023-06-05: 2 g via INTRAVENOUS

## 2023-06-05 MED ORDER — DIPHENHYDRAMINE HCL 50 MG/ML IJ SOLN
INTRAMUSCULAR | Status: AC
  Filled 2023-06-05: qty 1

## 2023-06-05 MED ORDER — OXYCODONE HCL 5 MG PO TABS: 5.0000 mg | ORAL_TABLET | ORAL | Status: DC | PRN

## 2023-06-05 MED ORDER — METHYLPREDNISOLONE SODIUM SUCC 125 MG IJ SOLR
INTRAMUSCULAR | Status: AC
  Filled 2023-06-05: qty 2

## 2023-06-05 MED ORDER — SODIUM CHLORIDE 0.9% FLUSH: 3.0000 mL | Freq: Two times a day (BID) | INTRAVENOUS | Status: DC

## 2023-06-05 MED ORDER — ONDANSETRON HCL 4 MG/2ML IJ SOLN: 4.0000 mg | Freq: Four times a day (QID) | INTRAMUSCULAR | Status: DC | PRN

## 2023-06-05 MED ORDER — FENTANYL CITRATE PF 50 MCG/ML IJ SOSY
PREFILLED_SYRINGE | INTRAMUSCULAR | Status: AC
  Filled 2023-06-05: qty 1

## 2023-06-05 MED ORDER — SODIUM CHLORIDE 0.9 % IV SOLN
INTRAVENOUS | Status: DC
Start: 2023-06-05 — End: 2023-06-05

## 2023-06-05 MED ORDER — HEPARIN (PORCINE) IN NACL 1000-0.9 UT/500ML-% IV SOLN
INTRAVENOUS | Status: DC | PRN
  Administered 2023-06-05: 1000 mL

## 2023-06-05 MED ORDER — MIDAZOLAM HCL 2 MG/2ML IJ SOLN
INTRAMUSCULAR | Status: DC | PRN
  Administered 2023-06-05: 2 mg via INTRAVENOUS

## 2023-06-05 MED ORDER — ACETAMINOPHEN 325 MG PO TABS: 650.0000 mg | ORAL_TABLET | ORAL | Status: DC | PRN

## 2023-06-05 MED ORDER — DIPHENHYDRAMINE HCL 50 MG/ML IJ SOLN
50.0000 mg | Freq: Once | INTRAMUSCULAR | Status: AC | PRN
  Administered 2023-06-05: 50 mg via INTRAVENOUS

## 2023-06-05 MED ORDER — SODIUM CHLORIDE 0.9 % IV SOLN: INTRAVENOUS | Status: DC

## 2023-06-05 MED ORDER — SODIUM CHLORIDE 0.9 % IV SOLN: 250.0000 mL | INTRAVENOUS | Status: DC | PRN

## 2023-06-05 MED ORDER — FAMOTIDINE 20 MG PO TABS
ORAL_TABLET | ORAL | Status: AC
  Filled 2023-06-05: qty 2

## 2023-06-05 MED ORDER — IODIXANOL 320 MG/ML IV SOLN
INTRAVENOUS | Status: DC | PRN
Start: 2023-06-05 — End: 2023-06-05
  Administered 2023-06-05: 40 mL via INTRA_ARTERIAL

## 2023-06-05 MED ORDER — MIDAZOLAM HCL 2 MG/ML PO SYRP: 8.0000 mg | ORAL_SOLUTION | Freq: Once | ORAL | Status: DC | PRN

## 2023-06-05 MED ORDER — METHYLPREDNISOLONE SODIUM SUCC 125 MG IJ SOLR
125.0000 mg | Freq: Once | INTRAMUSCULAR | Status: AC | PRN
  Administered 2023-06-05: 125 mg via INTRAVENOUS

## 2023-06-05 MED ORDER — DEXTROSE 50 % IV SOLN
INTRAVENOUS | Status: AC
  Filled 2023-06-05: qty 50

## 2023-06-05 MED ORDER — FENTANYL CITRATE (PF) 100 MCG/2ML IJ SOLN
INTRAMUSCULAR | Status: DC | PRN
  Administered 2023-06-05: 50 ug via INTRAVENOUS

## 2023-06-05 MED ORDER — SODIUM CHLORIDE 0.9% FLUSH: 3.0000 mL | INTRAVENOUS | Status: DC | PRN

## 2023-06-05 MED ORDER — HEPARIN SODIUM (PORCINE) 1000 UNIT/ML IJ SOLN
INTRAMUSCULAR | Status: AC
  Filled 2023-06-05: qty 10

## 2023-06-05 MED ORDER — DEXTROSE 50 % IV SOLN
12.5000 g | INTRAVENOUS | Status: AC
  Administered 2023-06-05: 12.5 g via INTRAVENOUS

## 2023-06-05 MED ORDER — FAMOTIDINE 20 MG PO TABS
40.0000 mg | ORAL_TABLET | Freq: Once | ORAL | Status: AC | PRN
  Administered 2023-06-05: 40 mg via ORAL

## 2023-06-05 SURGICAL SUPPLY — 14 items
CATH ANGIO 5F PIGTAIL 65CM (CATHETERS) IMPLANT
CATH TEMPO 5F RIM 65CM (CATHETERS) IMPLANT
COVER PROBE ULTRASOUND 5X96 (MISCELLANEOUS) IMPLANT
DEVICE STARCLOSE SE CLOSURE (Vascular Products) IMPLANT
GLIDEWIRE ADV .035X260CM (WIRE) IMPLANT
GOWN STRL REUS W/ TWL LRG LVL3 (GOWN DISPOSABLE) ×1 IMPLANT
NDL ENTRY 21GA 7CM ECHOTIP (NEEDLE) IMPLANT
NEEDLE ENTRY 21GA 7CM ECHOTIP (NEEDLE) ×1 IMPLANT
PACK ANGIOGRAPHY (CUSTOM PROCEDURE TRAY) ×1 IMPLANT
SET INTRO CAPELLA COAXIAL (SET/KITS/TRAYS/PACK) IMPLANT
SHEATH BRITE TIP 5FRX11 (SHEATH) IMPLANT
SYR MEDRAD MARK 7 150ML (SYRINGE) IMPLANT
TUBING CONTRAST HIGH PRESS 72 (TUBING) IMPLANT
WIRE J 3MM .035X145CM (WIRE) IMPLANT

## 2023-06-05 NOTE — Progress Notes (Signed)
 MRN : [id number]   Courtney [name] is a 83 y.o. (17-Sep-1940) female who presents with chief complaint of check circulation.   History of Present Illness:  Patient presents to [facility] today for treatment of her atherosclerotic occlusive disease.   She isstatus post angiogram with intervention on 03/07/2023.    Procedure: Procedure(s) Performed:             1.  Ultrasound guidance for vascular access left femoral artery             2.  Catheter placement into right common femoral artery from left femoral approach             3.  Aortogram and selective right lower extremity angiogram             4.  Percutaneous transluminal angioplasty of right anterior tibial artery in the distal segment with 2 and 2-1/2 mm diameter angioplasty balloons             5.  StarClose closure device left femoral artery     The patient notes improvement in the lower extremity symptoms. No interval shortening of the patient's claudication distance or rest pain symptoms. No new ulcers or wounds have occurred since the last visit.   The patient is concerned about ongoing diseases especially in the left lower extremity.   There have been no significant changes to the patient's overall health care.   No documented history of amaurosis fugax or recent TIA symptoms. There are no recent neurological changes noted. No documented history of DVT, PE or superficial thrombophlebitis. The patient denies recent episodes of angina or shortness of breath.    ABI's Rt=Auxier and Lt=Broxton  (previous ABI's Rt=Perkins and Lt=) currently the left TBI 0.34. Duplex US  of the tibial vessels shows the patient has one-vessel runoff through the anterior tibial artery.       Active Medications      Current Meds  Medication Sig   acetaminophen   (TYLENOL ) 500 MG tablet Take 500 mg by mouth every 6 (six) hours as needed (for pain.).    bisoprolol  (ZEBETA ) 10 MG tablet Take 1 tablet (10 mg total) by mouth daily.   cetirizine (ZYRTEC) 10 MG tablet Take 10 mg by mouth at bedtime.    Cholecalciferol  (D3) 25 MCG (1000 UT) capsule Take 1,000 Units by mouth daily.   famotidine  (PEPCID ) 40 MG tablet Take 40 mg by mouth daily.   furosemide (LASIX) 20 MG tablet Take 20 mg by mouth 2 (two) times daily.  gabapentin  (NEURONTIN ) 100 MG capsule Take 100 mg by mouth 2 (two) times daily.   inFLIXimab  (REMICADE ) 100 MG injection Inject 500 mg into the vein every 6 (six) weeks.   insulin  NPH Human (HUMULIN  N,NOVOLIN N) 100 UNIT/ML injection Inject 0.4 mLs (40 Units total) into the skin daily before breakfast. (Patient taking differently: Inject 25 Units into the skin in the morning and at bedtime.)   leflunomide  (ARAVA ) 10 MG tablet Take 10 mg by mouth daily.   montelukast  (SINGULAIR ) 10 MG tablet Take 10 mg by mouth daily.   Multiple Vitamin (MULTIVITAMIN WITH MINERALS) TABS tablet Take 1 tablet by mouth daily.   olmesartan-hydrochlorothiazide  (BENICAR HCT) 40-12.5 MG tablet Take 0.5 tablets by mouth daily.   pantoprazole  (PROTONIX ) 40 MG tablet Take 40 mg by mouth 2 (two) times daily.   polyethylene glycol (MIRALAX  / GLYCOLAX ) 17 g packet Take 17 g by mouth daily as needed for mild constipation.   rOPINIRole  (REQUIP ) 0.5 MG tablet Take 0.5 mg by mouth at bedtime as needed (RLS).   simvastatin  (ZOCOR ) 40 MG tablet Take 40 mg by mouth at bedtime.            Past Medical History:  Diagnosis Date   B12 deficiency     Chronic urticaria     Dermatitis     Eczema     Fibrosis, pulmonary, interstitial, diffuse (HCC)     GERD (gastroesophageal reflux disease)     Headache      migraines in the past (none since menopause)   Heart murmur     History of hiatal hernia     Hypertension     Loose stools     Osteoarthritis of spine     Plantar fasciitis      Pneumonia     PONV (postoperative nausea and vomiting)     Psoriasis     Rheumatoid arthritis (HCC)     Rosacea     Sleep apnea 2004    does not use cpap   Squamous cell skin cancer     T2DM (type 2 diabetes mellitus) (HCC)     Urethral stenosis      w/ bladder polyps. followed by Dr Michaeleen Adler    Varicose veins                 Past Surgical History:  Procedure Laterality Date   ABDOMINAL AORTOGRAM W/LOWER EXTREMITY Left 07/25/2022    Procedure: ABDOMINAL AORTOGRAM W/LOWER EXTREMITY;  Surgeon: Jackquelyn Mass, MD;  Location: [facility] INVASIVE CV LAB;  Service: Cardiovascular;  Laterality: Left;   AMPUTATION TOE Right 03/16/2023    Procedure: AMPUTATION TOE IPJ X 2 Great toe and second toe;  Surgeon: Anell Baptist, DPM;  Location: [facility] ORS;  Service: Orthopedics/Podiatry;  Laterality: Right;   ANTERIOR CERVICAL DECOMP/DISCECTOMY FUSION N/A 08/20/2017    Procedure: Anterior Cervical Decompression Fusion - Cervical Four - Cervical Five - Cervical Five - Cervical Six;  Surgeon: Agustina Aldrich, MD;  Location: [facility] OR;  Service: Neurosurgery;  Laterality: N/A;  Anterior Cervical Decompression Fusion - Cervical Four - Cervical Five - Cervical Five - Cervical Six   APPENDECTOMY       BILATERAL CARPAL TUNNEL RELEASE       BREAST BIOPSY Left 04/24/2022    US  Bx, 6:00 heart clip, SUBAREOLAR BREAST TISSUE WITH RUPTURED DUCT ECTASIA.   BREAST BIOPSY Left 04/24/2022    US  bx, 4:00 coil clip, SUBAREOLAR BREAST TISSUE WITH AREAS OF STROMAL FIBROSIS.   BREAST BIOPSY  Left 04/24/2022    US  LT BREAST BX W LOC DEV 1ST LESION IMG BX SPEC US  GUIDE 04/24/2022 [facility]   BREAST BIOPSY Left 04/24/2022    US  LT BREAST BX W LOC DEV EA ADD LESION IMG BX SPEC US  GUIDE 04/24/2022 [facility]   BREAST BIOPSY WITH RADIO FREQUENCY LOCALIZER Left 06/19/2022    Procedure: BREAST BIOPSY WITH RADIO FREQUENCY LOCALIZER;  Surgeon: Eldred Grego, MD;  Location: [facility] ORS;  Service: General;  Laterality: Left;    BREAST CYST ASPIRATION Left      neg   CATARACT EXTRACTION Bilateral     CHOLECYSTECTOMY   1963   COLONOSCOPY WITH PROPOFOL  N/A 12/31/2015    Procedure: COLONOSCOPY WITH PROPOFOL ;  Surgeon: Deveron Fly, MD;  Location: [facility] ENDOSCOPY;  Service: Endoscopy;  Laterality: N/A;   ENDOMETRIAL ABLATION   1991   ESOPHAGOGASTRODUODENOSCOPY (EGD) WITH PROPOFOL  N/A 12/31/2015    Procedure: ESOPHAGOGASTRODUODENOSCOPY (EGD) WITH PROPOFOL ;  Surgeon: Deveron Fly, MD;  Location: [facility] ENDOSCOPY;  Service: Endoscopy;  Laterality: N/A;   ESOPHAGOGASTRODUODENOSCOPY (EGD) WITH PROPOFOL  N/A 06/02/2016    Procedure: ESOPHAGOGASTRODUODENOSCOPY (EGD) WITH PROPOFOL ;  Surgeon: Deveron Fly, MD;  Location: [facility] ENDOSCOPY;  Service: Endoscopy;  Laterality: N/A;   ESOPHAGOGASTRODUODENOSCOPY (EGD) WITH PROPOFOL  N/A 11/07/2016    Procedure: ESOPHAGOGASTRODUODENOSCOPY (EGD) WITH PROPOFOL ;  Surgeon: Deveron Fly, MD;  Location: [facility] ENDOSCOPY;  Service: Endoscopy;  Laterality: N/A;   EYE SURGERY Bilateral 2006 and 2012    cataract surgery with lens implant   eyelid surgery Bilateral 2014   FOOT SURGERY Right      ligament and spurs   LOWER EXTREMITY ANGIOGRAPHY Right 03/07/2023    Procedure: Lower Extremity Angiography;  Surgeon: Celso College, MD;  Location: [facility] INVASIVE CV LAB;  Service: Cardiovascular;  Laterality: Right;   LUMBAR LAMINECTOMY/DECOMPRESSION MICRODISCECTOMY Right 12/24/2013    Procedure: RIGHT LUMBAR THREE TO FOUR, LUMBAR FOUR TO FIVE, LUMBAR FIVE TO SACRAL ONE LAMINECTOMY/FORAMINOTOMY;  Surgeon: Adelbert Adler, MD;  Location: [facility]O ORS;  Service: Neurosurgery;  Laterality: Right;  RIGHT L3-4 L4-5 L5-S1 LAMINECTOMY/FORAMINOTOMY   NASAL SEPTUM SURGERY   2004   SHOULDER SURGERY Right      for a frozen shoulder   SKIN CANCER EXCISION Right      leg x 4   skin cancer removal       TONSILLECTOMY       TUBAL LIGATION   1968          Social History Social History  Social  History        Tobacco Use   Smoking status: Never   Smokeless tobacco: Never  Vaping Use   Vaping status: Never Used  Substance Use Topics   Alcohol use: No   Drug use: No        Family History      Family History  Problem Relation Age of Onset   COPD Mother     Arthritis/Rheumatoid Mother     Alzheimer's disease Father     Heart attack Father     Breast cancer Paternal Aunt 13          Allergies       Allergies  Allergen Reactions   Methotrexate Derivatives Shortness Of Breath      Breathing difficulties    Acyclovir And Related Rash   Codeine Nausea Only   Contrast Media [Iodinated Contrast Media] Itching and Rash   Hydroxychloroquine Rash   Inderide [Propranolol-Hctz] Rash   Lodine [Etodolac] Rash  Metrizamide Itching and Rash   Procardia [Nifedipine] Rash   Propranolol Hcl Rash          REVIEW OF SYSTEMS (Negative unless checked)   Constitutional: [] Weight loss  [] Fever  [] Chills Cardiac: [] Chest pain   [] Chest pressure   [] Palpitations   [] Shortness of breath when laying flat   [] Shortness of breath with exertion. Vascular:  [x] Pain in legs with walking   [] Pain in legs at rest  [] History of DVT   [] Phlebitis   [] Swelling in legs   [] Varicose veins   [] Non-healing ulcers Pulmonary:   [] Uses home oxygen   [] Productive cough   [] Hemoptysis   [] Wheeze  [] COPD   [] Asthma Neurologic:  [] Dizziness   [] Seizures   [] History of stroke   [] History of TIA  [] Aphasia   [] Vissual changes   [] Weakness or numbness in arm   [] Weakness or numbness in leg Musculoskeletal:   [] Joint swelling   [] Joint pain   [] Low back pain Hematologic:  [] Easy bruising  [] Easy bleeding   [] Hypercoagulable state   [] Anemic Gastrointestinal:  [] Diarrhea   [] Vomiting  [] Gastroesophageal reflux/heartburn   [] Difficulty swallowing. Genitourinary:  [] Chronic kidney disease   [] Difficult urination  [] Frequent urination   [] Blood in urine Skin:  [] Rashes   [] Ulcers  Psychological:  [] History of  anxiety   []  History of major depression.   Physical Examination      Vitals:    06/05/23 0736  BP: (!) 145/76  Pulse: 67  Resp: 17  Temp: 97.8 F (36.6 C)  TempSrc: Oral  SpO2: 100%  Weight: 75.7 kg  Height: 5\' 3"  (1.6 m)    Body mass index is 29.56 kg/m. Gen: WD/WN, NAD Head: [place]/AT, No temporalis wasting.  Ear/Nose/Throat: Hearing grossly intact, nares w/o erythema or drainage Eyes: PER, EOMI, sclera nonicteric.  Neck: Supple, no masses.  No bruit or JVD.  Pulmonary:  Good air movement, no audible wheezing, no use of accessory muscles.  Cardiac: RRR, normal S1, S2, no Murmurs. Vascular:  mild trophic changes, no open wounds Vessel Right Left  Radial Palpable Palpable  PT Not Palpable Not Palpable  DP Not Palpable Not Palpable  Gastrointestinal: soft, non-distended. No guarding/no peritoneal signs.  Musculoskeletal: M/S 5/5 throughout.  No visible deformity.  Neurologic: CN 2-12 intact. Pain and light touch intact in extremities.  Symmetrical.  Speech is fluent. Motor exam as listed above. Psychiatric: Judgment intact, Mood & affect appropriate for pt's clinical situation. Dermatologic: No rashes or ulcers noted.  No changes consistent with cellulitis.     CBC Recent Labs       Lab Results  Component Value Date    WBC 5.1 07/27/2022    HGB 10.4 (L) 07/27/2022    HCT 31.5 (L) 07/27/2022    MCV 87.7 07/27/2022    PLT 93 (L) 07/27/2022        BMET Labs (Brief)          Component Value Date/Time    NA 137 03/14/2023 1441    K 3.3 (L) 03/14/2023 1441    CL 105 03/14/2023 1441    CO2 24 03/14/2023 1441    GLUCOSE 87 03/14/2023 1441    BUN 66 (H) 06/05/2023 0734    CREATININE 1.43 (H) 06/05/2023 0734    CALCIUM 9.5 03/14/2023 1441    GFRNONAA 36 (L) 06/05/2023 0734    GFRAA >60 08/13/2017 1430      Estimated Creatinine Clearance: 28.5 mL/min (A) (by C-G formula based on SCr of 1.43  mg/dL (H)).   COAG Recent Labs       Lab Results  Component Value  Date    INR 1.22 12/25/2014        Radiology Imaging Results  No results found.       Assessment/Plan 1. Peripheral arterial disease with history of revascularization (HCC) (Primary) Recommend:   The patient is status post successful angiogram with intervention.  The patient reports that the claudication symptoms and leg pain has improved.   The patient denies lifestyle limiting changes at this point in time.   The patient is concerned about possible obstruction of her anterior tibial vessel in the left lower extremity.  This is certainly reasonable given that she also has only one-vessel runoff.  Following discussion with the patient she would like to move forward with a left lower extremity angiogram.  We have discussed the risk, benefits alternatives.  She is agreeable to proceed.  However, she would like to wait approximately 8 weeks to allow for her recent amputation site to heal a little bit more before undergoing angiogram.  Will have the patient follow-up postintervention.   2. Essential hypertension Continue antihypertensive medications as already ordered, these medications have been reviewed and there are no changes at this time.   3. Controlled type 2 diabetes mellitus with diabetic neuropathy, with long-term current use of insulin  (HCC) Continue hypoglycemic medications as already ordered, these medications have been reviewed and there are no changes at this time.   Hgb A1C to be monitored as already arranged by primary service   4. Gangrene of toe of right foot (HCC) Currently healing well.  Will continue to follow with podiatry.     Devon Fogo, MD   06/05/2023 9:26 AM            Electronically signed by Jackquelyn Mass, MD at 06/05/2023  9:28 AM

## 2023-06-05 NOTE — Op Note (Deleted)
 MRN : [id number]  Courtney [name] is a 83 y.o. (1940/03/25) female who presents with chief complaint of check circulation.  History of Present Illness:  Patient presents to [facility] today for treatment of her atherosclerotic occlusive disease.  She isstatus post angiogram with intervention on 03/07/2023.    Procedure: Procedure(s) Performed:             1.  Ultrasound guidance for vascular access left femoral artery             2.  Catheter placement into right common femoral artery from left femoral approach             3.  Aortogram and selective right lower extremity angiogram             4.  Percutaneous transluminal angioplasty of right anterior tibial artery in the distal segment with 2 and 2-1/2 mm diameter angioplasty balloons             5.  StarClose closure device left femoral artery     The patient notes improvement in the lower extremity symptoms. No interval shortening of the patient's claudication distance or rest pain symptoms. No new ulcers or wounds have occurred since the last visit.   The patient is concerned about ongoing diseases especially in the left lower extremity.   There have been no significant changes to the patient's overall health care.   No documented history of amaurosis fugax or recent TIA symptoms. There are no recent neurological changes noted. No documented history of DVT, PE or superficial thrombophlebitis. The patient denies recent episodes of angina or shortness of breath.    ABI's Rt=[place] and Lt=[place]  (previous ABI's Rt=[place] and Lt=) currently the left TBI 0.34. Duplex US  of the tibial vessels shows the patient has one-vessel runoff through the anterior tibial artery.     Current Meds  Medication Sig   acetaminophen  (TYLENOL ) 500 MG tablet Take 500 mg by mouth every 6 (six) hours as needed (for pain.).    bisoprolol  (ZEBETA ) 10 MG tablet Take 1  tablet (10 mg total) by mouth daily.   cetirizine (ZYRTEC) 10 MG tablet Take 10 mg by mouth at bedtime.    Cholecalciferol  (D3) 25 MCG (1000 UT) capsule Take 1,000 Units by mouth daily.   famotidine  (PEPCID ) 40 MG tablet Take 40 mg by mouth daily.   furosemide (LASIX) 20 MG tablet Take 20 mg by mouth 2 (two) times daily.   gabapentin  (NEURONTIN ) 100 MG capsule Take 100 mg by mouth 2 (two) times daily.   inFLIXimab  (REMICADE ) 100 MG injection Inject 500 mg into the vein every 6 (six) weeks.   insulin  NPH Human (HUMULIN  N,NOVOLIN N) 100 UNIT/ML injection Inject 0.4 mLs (40 Units total) into the skin daily before breakfast. (Patient taking differently: Inject 25 Units into the skin in the morning and at bedtime.)   leflunomide  (ARAVA ) 10 MG tablet Take 10 mg by mouth daily.   montelukast  (SINGULAIR ) 10 MG tablet Take 10 mg by mouth daily.   Multiple Vitamin (MULTIVITAMIN WITH MINERALS) TABS tablet Take  1 tablet by mouth daily.   olmesartan-hydrochlorothiazide  (BENICAR HCT) 40-12.5 MG tablet Take 0.5 tablets by mouth daily.   pantoprazole  (PROTONIX ) 40 MG tablet Take 40 mg by mouth 2 (two) times daily.   polyethylene glycol (MIRALAX  / GLYCOLAX ) 17 g packet Take 17 g by mouth daily as needed for mild constipation.   rOPINIRole  (REQUIP ) 0.5 MG tablet Take 0.5 mg by mouth at bedtime as needed (RLS).   simvastatin  (ZOCOR ) 40 MG tablet Take 40 mg by mouth at bedtime.    Past Medical History:  Diagnosis Date   B12 deficiency    Chronic urticaria    Dermatitis    Eczema    Fibrosis, pulmonary, interstitial, diffuse (HCC)    GERD (gastroesophageal reflux disease)    Headache    migraines in the past (none since menopause)   Heart murmur    History of hiatal hernia    Hypertension    Loose stools    Osteoarthritis of spine    Plantar fasciitis    Pneumonia    PONV (postoperative nausea and vomiting)    Psoriasis    Rheumatoid arthritis (HCC)    Rosacea    Sleep apnea 2004   does not use  cpap   Squamous cell skin cancer    T2DM (type 2 diabetes mellitus) (HCC)    Urethral stenosis    w/ bladder polyps. followed by Dr Michaeleen Adler    Varicose veins     Past Surgical History:  Procedure Laterality Date   ABDOMINAL AORTOGRAM W/LOWER EXTREMITY Left 07/25/2022   Procedure: ABDOMINAL AORTOGRAM W/LOWER EXTREMITY;  Surgeon: Jackquelyn Mass, MD;  Location: [facility] INVASIVE CV LAB;  Service: Cardiovascular;  Laterality: Left;   AMPUTATION TOE Right 03/16/2023   Procedure: AMPUTATION TOE IPJ X 2 Great toe and second toe;  Surgeon: Anell Baptist, DPM;  Location: [facility] ORS;  Service: Orthopedics/Podiatry;  Laterality: Right;   ANTERIOR CERVICAL DECOMP/DISCECTOMY FUSION N/A 08/20/2017   Procedure: Anterior Cervical Decompression Fusion - Cervical Four - Cervical Five - Cervical Five - Cervical Six;  Surgeon: Agustina Aldrich, MD;  Location: [facility] OR;  Service: Neurosurgery;  Laterality: N/A;  Anterior Cervical Decompression Fusion - Cervical Four - Cervical Five - Cervical Five - Cervical Six   APPENDECTOMY     BILATERAL CARPAL TUNNEL RELEASE     BREAST BIOPSY Left 04/24/2022   US  Bx, 6:00 heart clip, SUBAREOLAR BREAST TISSUE WITH RUPTURED DUCT ECTASIA.   BREAST BIOPSY Left 04/24/2022   US  bx, 4:00 coil clip, SUBAREOLAR BREAST TISSUE WITH AREAS OF STROMAL FIBROSIS.   BREAST BIOPSY Left 04/24/2022   US  LT BREAST BX W LOC DEV 1ST LESION IMG BX SPEC US  GUIDE 04/24/2022 [facility]   BREAST BIOPSY Left 04/24/2022   US  LT BREAST BX W LOC DEV EA ADD LESION IMG BX SPEC US  GUIDE 04/24/2022 [facility]   BREAST BIOPSY WITH RADIO FREQUENCY LOCALIZER Left 06/19/2022   Procedure: BREAST BIOPSY WITH RADIO FREQUENCY LOCALIZER;  Surgeon: Eldred Grego, MD;  Location: [facility] ORS;  Service: General;  Laterality: Left;   BREAST CYST ASPIRATION Left    neg   CATARACT EXTRACTION Bilateral    CHOLECYSTECTOMY  1963   COLONOSCOPY WITH PROPOFOL  N/A 12/31/2015   Procedure: COLONOSCOPY WITH PROPOFOL ;  Surgeon:  Deveron Fly, MD;  Location: [facility] ENDOSCOPY;  Service: Endoscopy;  Laterality: N/A;   ENDOMETRIAL ABLATION  1991   ESOPHAGOGASTRODUODENOSCOPY (EGD) WITH PROPOFOL  N/A 12/31/2015   Procedure: ESOPHAGOGASTRODUODENOSCOPY (EGD) WITH PROPOFOL ;  Surgeon: Deveron Fly, MD;  Location: [facility] ENDOSCOPY;  Service: Endoscopy;  Laterality: N/A;   ESOPHAGOGASTRODUODENOSCOPY (EGD) WITH PROPOFOL  N/A 06/02/2016   Procedure: ESOPHAGOGASTRODUODENOSCOPY (EGD) WITH PROPOFOL ;  Surgeon: Deveron Fly, MD;  Location: [facility] ENDOSCOPY;  Service: Endoscopy;  Laterality: N/A;   ESOPHAGOGASTRODUODENOSCOPY (EGD) WITH PROPOFOL  N/A 11/07/2016   Procedure: ESOPHAGOGASTRODUODENOSCOPY (EGD) WITH PROPOFOL ;  Surgeon: Deveron Fly, MD;  Location: [facility] ENDOSCOPY;  Service: Endoscopy;  Laterality: N/A;   EYE SURGERY Bilateral 2006 and 2012   cataract surgery with lens implant   eyelid surgery Bilateral 2014   FOOT SURGERY Right    ligament and spurs   LOWER EXTREMITY ANGIOGRAPHY Right 03/07/2023   Procedure: Lower Extremity Angiography;  Surgeon: Celso College, MD;  Location: [facility] INVASIVE CV LAB;  Service: Cardiovascular;  Laterality: Right;   LUMBAR LAMINECTOMY/DECOMPRESSION MICRODISCECTOMY Right 12/24/2013   Procedure: RIGHT LUMBAR THREE TO FOUR, LUMBAR FOUR TO FIVE, LUMBAR FIVE TO SACRAL ONE LAMINECTOMY/FORAMINOTOMY;  Surgeon: Adelbert Adler, MD;  Location: [facility] NE[facility]O ORS;  Service: Neurosurgery;  Laterality: Right;  RIGHT L3-4 L4-5 L5-S1 LAMINECTOMY/FORAMINOTOMY   NASAL SEPTUM SURGERY  2004   SHOULDER SURGERY Right    for a frozen shoulder   SKIN CANCER EXCISION Right    leg x 4   skin cancer removal     TONSILLECTOMY     TUBAL LIGATION  1968    Social History Social History   Tobacco Use   Smoking status: Never   Smokeless tobacco: Never  Vaping Use   Vaping status: Never Used  Substance Use Topics   Alcohol use: No   Drug use: No    Family History Family History  Problem Relation Age of  Onset   COPD Mother    Arthritis/Rheumatoid Mother    Alzheimer's disease Father    Heart attack Father    Breast cancer Paternal Aunt 26    Allergies  Allergen Reactions   Methotrexate Derivatives Shortness Of Breath    Breathing difficulties    Acyclovir And Related Rash   Codeine Nausea Only   Contrast Media [Iodinated Contrast Media] Itching and Rash   Hydroxychloroquine Rash   Inderide [Propranolol-Hctz] Rash   Lodine [Etodolac] Rash   Metrizamide Itching and Rash   Procardia [Nifedipine] Rash   Propranolol Hcl Rash     REVIEW OF SYSTEMS (Negative unless checked)  Constitutional: [] Weight loss  [] Fever  [] Chills Cardiac: [] Chest pain   [] Chest pressure   [] Palpitations   [] Shortness of breath when laying flat   [] Shortness of breath with exertion. Vascular:  [x] Pain in legs with walking   [] Pain in legs at rest  [] History of DVT   [] Phlebitis   [] Swelling in legs   [] Varicose veins   [] Non-healing ulcers Pulmonary:   [] Uses home oxygen   [] Productive cough   [] Hemoptysis   [] Wheeze  [] COPD   [] Asthma Neurologic:  [] Dizziness   [] Seizures   [] History of stroke   [] History of TIA  [] Aphasia   [] Vissual changes   [] Weakness or numbness in arm   [] Weakness or numbness in leg Musculoskeletal:   [] Joint swelling   [] Joint pain   [] Low back pain Hematologic:  [] Easy bruising  [] Easy bleeding   [] Hypercoagulable state   [] Anemic Gastrointestinal:  [] Diarrhea   [] Vomiting  [] Gastroesophageal reflux/heartburn   [] Difficulty swallowing. Genitourinary:  [] Chronic kidney disease   [] Difficult urination  [] Frequent urination   [] Blood in urine Skin:  [] Rashes   [] Ulcers  Psychological:  [] History of anxiety   []  History of major depression.  Physical Examination  Vitals:   06/05/23 0736  BP: (!) 145/76  Pulse: 67  Resp: 17  Temp: 97.8 F (36.6 C)  TempSrc: Oral  SpO2: 100%  Weight: 75.7 kg  Height: 5\' 3"  (1.6 m)   Body mass index is 29.56 kg/m. Gen: WD/WN, NAD Head:  [place]/AT, No temporalis wasting.  Ear/Nose/Throat: Hearing grossly intact, nares w/o erythema or drainage Eyes: PER, EOMI, sclera nonicteric.  Neck: Supple, no masses.  No bruit or JVD.  Pulmonary:  Good air movement, no audible wheezing, no use of accessory muscles.  Cardiac: RRR, normal S1, S2, no Murmurs. Vascular:  mild trophic changes, no open wounds Vessel Right Left  Radial Palpable Palpable  PT Not Palpable Not Palpable  DP Not Palpable Not Palpable  Gastrointestinal: soft, non-distended. No guarding/no peritoneal signs.  Musculoskeletal: M/S 5/5 throughout.  No visible deformity.  Neurologic: CN 2-12 intact. Pain and light touch intact in extremities.  Symmetrical.  Speech is fluent. Motor exam as listed above. Psychiatric: Judgment intact, Mood & affect appropriate for pt's clinical situation. Dermatologic: No rashes or ulcers noted.  No changes consistent with cellulitis.   CBC Lab Results  Component Value Date   WBC 5.1 07/27/2022   HGB 10.4 (L) 07/27/2022   HCT 31.5 (L) 07/27/2022   MCV 87.7 07/27/2022   PLT 93 (L) 07/27/2022    BMET    Component Value Date/Time   NA 137 03/14/2023 1441   K 3.3 (L) 03/14/2023 1441   CL 105 03/14/2023 1441   CO2 24 03/14/2023 1441   GLUCOSE 87 03/14/2023 1441   BUN 66 (H) 06/05/2023 0734   CREATININE 1.43 (H) 06/05/2023 0734   CALCIUM 9.5 03/14/2023 1441   GFRNONAA 36 (L) 06/05/2023 0734   GFRAA >60 08/13/2017 1430   Estimated Creatinine Clearance: 28.5 mL/min (A) (by C-G formula based on SCr of 1.43 mg/dL (H)).  COAG Lab Results  Component Value Date   INR 1.22 12/25/2014    Radiology No results found.   Assessment/Plan 1. Peripheral arterial disease with history of revascularization (HCC) (Primary) Recommend:   The patient is status post successful angiogram with intervention.  The patient reports that the claudication symptoms and leg pain has improved.   The patient denies lifestyle limiting changes at this point  in time.   The patient is concerned about possible obstruction of her anterior tibial vessel in the left lower extremity.  This is certainly reasonable given that she also has only one-vessel runoff.  Following discussion with the patient she would like to move forward with a left lower extremity angiogram.  We have discussed the risk, benefits alternatives.  She is agreeable to proceed.  However, she would like to wait approximately 8 weeks to allow for her recent amputation site to heal a little bit more before undergoing angiogram.  Will have the patient follow-up postintervention.   2. Essential hypertension Continue antihypertensive medications as already ordered, these medications have been reviewed and there are no changes at this time.   3. Controlled type 2 diabetes mellitus with diabetic neuropathy, with long-term current use of insulin  (HCC) Continue hypoglycemic medications as already ordered, these medications have been reviewed and there are no changes at this time.   Hgb A1C to be monitored as already arranged by primary service   4. Gangrene of toe of right foot (HCC) Currently healing well.  Will continue to follow with podiatry.   Devon Fogo, MD  06/05/2023 9:26 AM

## 2023-06-05 NOTE — H&P (View-Only) (Signed)
 MRN : [id number]   Courtney [name] is a 83 y.o. ([birth year]) female who presents with chief complaint of check circulation.   History of Present Illness:  Patient presents to [facility] today for treatment of her atherosclerotic occlusive disease.   She isstatus post angiogram with intervention on 03/07/2023.    Procedure: Procedure(s) Performed:             1.  Ultrasound guidance for vascular access left femoral artery             2.  Catheter placement into right common femoral artery from left femoral approach             3.  Aortogram and selective right lower extremity angiogram             4.  Percutaneous transluminal angioplasty of right anterior tibial artery in the distal segment with 2 and 2-1/2 mm diameter angioplasty balloons             5.  StarClose closure device left femoral artery     The patient notes improvement in the lower extremity symptoms. No interval shortening of the patient's claudication distance or rest pain symptoms. No new ulcers or wounds have occurred since the last visit.   The patient is concerned about ongoing diseases especially in the left lower extremity.   There have been no significant changes to the patient's overall health care.   No documented history of amaurosis fugax or recent TIA symptoms. There are no recent neurological changes noted. No documented history of DVT, PE or superficial thrombophlebitis. The patient denies recent episodes of angina or shortness of breath.    ABI's Rt=[place] and Lt=[place]  (previous ABI's Rt=[place] and Lt=) currently the left TBI 0.34. Duplex US  of the tibial vessels shows the patient has one-vessel runoff through the anterior tibial artery.       Active Medications      Current Meds  Medication Sig   acetaminophen   (TYLENOL ) 500 MG tablet Take 500 mg by mouth every 6 (six) hours as needed (for pain.).    bisoprolol  (ZEBETA ) 10 MG tablet Take 1 tablet (10 mg total) by mouth daily.   cetirizine (ZYRTEC) 10 MG tablet Take 10 mg by mouth at bedtime.    Cholecalciferol  (D3) 25 MCG (1000 UT) capsule Take 1,000 Units by mouth daily.   famotidine  (PEPCID ) 40 MG tablet Take 40 mg by mouth daily.   furosemide (LASIX) 20 MG tablet Take 20 mg by mouth 2 (two) times daily.  gabapentin  (NEURONTIN ) 100 MG capsule Take 100 mg by mouth 2 (two) times daily.   inFLIXimab  (REMICADE ) 100 MG injection Inject 500 mg into the vein every 6 (six) weeks.   insulin  NPH Human (HUMULIN  N,NOVOLIN N) 100 UNIT/ML injection Inject 0.4 mLs (40 Units total) into the skin daily before breakfast. (Patient taking differently: Inject 25 Units into the skin in the morning and at bedtime.)   leflunomide  (ARAVA ) 10 MG tablet Take 10 mg by mouth daily.   montelukast  (SINGULAIR ) 10 MG tablet Take 10 mg by mouth daily.   Multiple Vitamin (MULTIVITAMIN WITH MINERALS) TABS tablet Take 1 tablet by mouth daily.   olmesartan-hydrochlorothiazide  (BENICAR HCT) 40-12.5 MG tablet Take 0.5 tablets by mouth daily.   pantoprazole  (PROTONIX ) 40 MG tablet Take 40 mg by mouth 2 (two) times daily.   polyethylene glycol (MIRALAX  / GLYCOLAX ) 17 g packet Take 17 g by mouth daily as needed for mild constipation.   rOPINIRole  (REQUIP ) 0.5 MG tablet Take 0.5 mg by mouth at bedtime as needed (RLS).   simvastatin  (ZOCOR ) 40 MG tablet Take 40 mg by mouth at bedtime.            Past Medical History:  Diagnosis Date   B12 deficiency     Chronic urticaria     Dermatitis     Eczema     Fibrosis, pulmonary, interstitial, diffuse (HCC)     GERD (gastroesophageal reflux disease)     Headache      migraines in the past (none since menopause)   Heart murmur     History of hiatal hernia     Hypertension     Loose stools     Osteoarthritis of spine     Plantar fasciitis      Pneumonia     PONV (postoperative nausea and vomiting)     Psoriasis     Rheumatoid arthritis (HCC)     Rosacea     Sleep apnea 2004    does not use cpap   Squamous cell skin cancer     T2DM (type 2 diabetes mellitus) (HCC)     Urethral stenosis      w/ bladder polyps. followed by Dr Michaeleen Adler    Varicose veins                 Past Surgical History:  Procedure Laterality Date   ABDOMINAL AORTOGRAM W/LOWER EXTREMITY Left 07/25/2022    Procedure: ABDOMINAL AORTOGRAM W/LOWER EXTREMITY;  Surgeon: Jackquelyn Mass, MD;  Location: [facility]IVE [facility] LAB;  Service: Cardiovascular;  Laterality: Left;   AMPUTATION TOE Right 03/16/2023    Procedure: AMPUTATION TOE IPJ X 2 Great toe and second toe;  Surgeon: Anell Baptist, DPM;  Location: [facility] ORS;  Service: Orthopedics/Podiatry;  Laterality: Right;   ANTERIOR CERVICAL DECOMP/DISCECTOMY FUSION N/A 08/20/2017    Procedure: Anterior Cervical Decompression Fusion - Cervical Four - Cervical Five - Cervical Five - Cervical Six;  Surgeon: Agustina Aldrich, MD;  Location: [facility] OR;  Service: Neurosurgery;  Laterality: N/A;  Anterior Cervical Decompression Fusion - Cervical Four - Cervical Five - Cervical Five - Cervical Six   APPENDECTOMY       BILATERAL CARPAL TUNNEL RELEASE       BREAST BIOPSY Left 04/24/2022    US  Bx, 6:00 heart clip, SUBAREOLAR BREAST TISSUE WITH RUPTURED DUCT ECTASIA.   BREAST BIOPSY Left 04/24/2022    US  bx, 4:00 coil clip, SUBAREOLAR BREAST TISSUE WITH AREAS OF STROMAL FIBROSIS.   BREAST BIOPSY  Left 04/24/2022    US  LT BREAST BX W LOC DEV 1ST LESION IMG BX SPEC US  GUIDE 04/24/2022 [facility]   BREAST BIOPSY Left 04/24/2022    US  LT BREAST BX W LOC DEV EA ADD LESION IMG BX SPEC US  GUIDE 04/24/2022 [facility]   BREAST BIOPSY WITH RADIO FREQUENCY LOCALIZER Left 06/19/2022    Procedure: BREAST BIOPSY WITH RADIO FREQUENCY LOCALIZER;  Surgeon: Eldred Grego, MD;  Location: [facility] ORS;  Service: General;  Laterality: Left;    BREAST CYST ASPIRATION Left      neg   CATARACT EXTRACTION Bilateral     CHOLECYSTECTOMY   1963   COLONOSCOPY WITH PROPOFOL  N/A 12/31/2015    Procedure: COLONOSCOPY WITH PROPOFOL ;  Surgeon: Deveron Fly, MD;  Location: [facility] ENDOSCOPY;  Service: Endoscopy;  Laterality: N/A;   ENDOMETRIAL ABLATION   1991   ESOPHAGOGASTRODUODENOSCOPY (EGD) WITH PROPOFOL  N/A 12/31/2015    Procedure: ESOPHAGOGASTRODUODENOSCOPY (EGD) WITH PROPOFOL ;  Surgeon: Deveron Fly, MD;  Location: [facility] ENDOSCOPY;  Service: Endoscopy;  Laterality: N/A;   ESOPHAGOGASTRODUODENOSCOPY (EGD) WITH PROPOFOL  N/A 06/02/2016    Procedure: ESOPHAGOGASTRODUODENOSCOPY (EGD) WITH PROPOFOL ;  Surgeon: Deveron Fly, MD;  Location: [facility] ENDOSCOPY;  Service: Endoscopy;  Laterality: N/A;   ESOPHAGOGASTRODUODENOSCOPY (EGD) WITH PROPOFOL  N/A 11/07/2016    Procedure: ESOPHAGOGASTRODUODENOSCOPY (EGD) WITH PROPOFOL ;  Surgeon: Deveron Fly, MD;  Location: [facility] ENDOSCOPY;  Service: Endoscopy;  Laterality: N/A;   EYE SURGERY Bilateral 2006 and 2012    cataract surgery with lens implant   eyelid surgery Bilateral 2014   FOOT SURGERY Right      ligament and spurs   LOWER EXTREMITY ANGIOGRAPHY Right 03/07/2023    Procedure: Lower Extremity Angiography;  Surgeon: Celso College, MD;  Location: [facility] INVASIVE CV LAB;  Service: Cardiovascular;  Laterality: Right;   LUMBAR LAMINECTOMY/DECOMPRESSION MICRODISCECTOMY Right 12/24/2013    Procedure: RIGHT LUMBAR THREE TO FOUR, LUMBAR FOUR TO FIVE, LUMBAR FIVE TO SACRAL ONE LAMINECTOMY/FORAMINOTOMY;  Surgeon: Adelbert Adler, MD;  Location: [facility] ORS;  Service: Neurosurgery;  Laterality: Right;  RIGHT L3-4 L4-5 L5-S1 LAMINECTOMY/FORAMINOTOMY   NASAL SEPTUM SURGERY   2004   SHOULDER SURGERY Right      for a frozen shoulder   SKIN CANCER EXCISION Right      leg x 4   skin cancer removal       TONSILLECTOMY       TUBAL LIGATION   1968          Social History Social History  Social  History        Tobacco Use   Smoking status: Never   Smokeless tobacco: Never  Vaping Use   Vaping status: Never Used  Substance Use Topics   Alcohol use: No   Drug use: No        Family History      Family History  Problem Relation Age of Onset   COPD Mother     Arthritis/Rheumatoid Mother     Alzheimer's disease Father     Heart attack Father     Breast cancer Paternal Aunt 13          Allergies       Allergies  Allergen Reactions   Methotrexate Derivatives Shortness Of Breath      Breathing difficulties    Acyclovir And Related Rash   Codeine Nausea Only   Contrast Media [Iodinated Contrast Media] Itching and Rash   Hydroxychloroquine Rash   Inderide [Propranolol-Hctz] Rash   Lodine [Etodolac] Rash  Metrizamide Itching and Rash   Procardia [Nifedipine] Rash   Propranolol Hcl Rash          REVIEW OF SYSTEMS (Negative unless checked)   Constitutional: [] Weight loss  [] Fever  [] Chills Cardiac: [] Chest pain   [] Chest pressure   [] Palpitations   [] Shortness of breath when laying flat   [] Shortness of breath with exertion. Vascular:  [x] Pain in legs with walking   [] Pain in legs at rest  [] History of DVT   [] Phlebitis   [] Swelling in legs   [] Varicose veins   [] Non-healing ulcers Pulmonary:   [] Uses home oxygen   [] Productive cough   [] Hemoptysis   [] Wheeze  [] COPD   [] Asthma Neurologic:  [] Dizziness   [] Seizures   [] History of stroke   [] History of TIA  [] Aphasia   [] Vissual changes   [] Weakness or numbness in arm   [] Weakness or numbness in leg Musculoskeletal:   [] Joint swelling   [] Joint pain   [] Low back pain Hematologic:  [] Easy bruising  [] Easy bleeding   [] Hypercoagulable state   [] Anemic Gastrointestinal:  [] Diarrhea   [] Vomiting  [] Gastroesophageal reflux/heartburn   [] Difficulty swallowing. Genitourinary:  [] Chronic kidney disease   [] Difficult urination  [] Frequent urination   [] Blood in urine Skin:  [] Rashes   [] Ulcers  Psychological:  [] History of  anxiety   []  History of major depression.   Physical Examination      Vitals:    06/05/23 0736  BP: (!) 145/76  Pulse: 67  Resp: 17  Temp: 97.8 F (36.6 C)  TempSrc: Oral  SpO2: 100%  Weight: 75.7 kg  Height: 5\' 3"  (1.6 m)    Body mass index is 29.56 kg/m. Gen: WD/WN, NAD Head: [place]/AT, No temporalis wasting.  Ear/Nose/Throat: Hearing grossly intact, nares w/o erythema or drainage Eyes: PER, EOMI, sclera nonicteric.  Neck: Supple, no masses.  No bruit or JVD.  Pulmonary:  Good air movement, no audible wheezing, no use of accessory muscles.  Cardiac: RRR, normal S1, S2, no Murmurs. Vascular:  mild trophic changes, no open wounds Vessel Right Left  Radial Palpable Palpable  PT Not Palpable Not Palpable  DP Not Palpable Not Palpable  Gastrointestinal: soft, non-distended. No guarding/no peritoneal signs.  Musculoskeletal: M/S 5/5 throughout.  No visible deformity.  Neurologic: CN 2-12 intact. Pain and light touch intact in extremities.  Symmetrical.  Speech is fluent. Motor exam as listed above. Psychiatric: Judgment intact, Mood & affect appropriate for pt's clinical situation. Dermatologic: No rashes or ulcers noted.  No changes consistent with cellulitis.     CBC Recent Labs       Lab Results  Component Value Date    WBC 5.1 07/27/2022    HGB 10.4 (L) 07/27/2022    HCT 31.5 (L) 07/27/2022    MCV 87.7 07/27/2022    PLT 93 (L) 07/27/2022        BMET Labs (Brief)          Component Value Date/Time    NA 137 03/14/2023 1441    K 3.3 (L) 03/14/2023 1441    CL 105 03/14/2023 1441    CO2 24 03/14/2023 1441    GLUCOSE 87 03/14/2023 1441    BUN 66 (H) 06/05/2023 0734    CREATININE 1.43 (H) 06/05/2023 0734    CALCIUM 9.5 03/14/2023 1441    GFRNONAA 36 (L) 06/05/2023 0734    GFRAA >60 08/13/2017 1430      Estimated Creatinine Clearance: 28.5 mL/min (A) (by C-G formula based on SCr of 1.43  mg/dL (H)).   COAG Recent Labs       Lab Results  Component Value  Date    INR 1.22 12/25/2014        Radiology Imaging Results  No results found.       Assessment/Plan 1. Peripheral arterial disease with history of revascularization (HCC) (Primary) Recommend:   The patient is status post successful angiogram with intervention.  The patient reports that the claudication symptoms and leg pain has improved.   The patient denies lifestyle limiting changes at this point in time.   The patient is concerned about possible obstruction of her anterior tibial vessel in the left lower extremity.  This is certainly reasonable given that she also has only one-vessel runoff.  Following discussion with the patient she would like to move forward with a left lower extremity angiogram.  We have discussed the risk, benefits alternatives.  She is agreeable to proceed.  However, she would like to wait approximately 8 weeks to allow for her recent amputation site to heal a little bit more before undergoing angiogram.  Will have the patient follow-up postintervention.   2. Essential hypertension Continue antihypertensive medications as already ordered, these medications have been reviewed and there are no changes at this time.   3. Controlled type 2 diabetes mellitus with diabetic neuropathy, with long-term current use of insulin  (HCC) Continue hypoglycemic medications as already ordered, these medications have been reviewed and there are no changes at this time.   Hgb A1C to be monitored as already arranged by primary service   4. Gangrene of toe of right foot (HCC) Currently healing well.  Will continue to follow with podiatry.     Devon Fogo, MD   06/05/2023 9:26 AM            Electronically signed by Jackquelyn Mass, MD at 06/05/2023  9:28 AM

## 2023-06-05 NOTE — Op Note (Signed)
 [facility]  Percutaneous Study/Intervention Procedural Note   Date of Surgery: 06/05/2023,10:22 AM  Surgeon:Shawne Bulow, Ninette Basque   Pre-operative Diagnosis: Atherosclerotic occlusive disease bilateral lower extremities with rest pain  Post-operative diagnosis:  Same  Procedure(s) Performed:  1.  Abdominal aortogram  2.  Selective injection of the left lower extremity third order catheter placement  3.  Ultrasound-guided access to the right common femoral artery  4.  StarClose right femoral artery    Anesthesia: Conscious sedation was administered by the interventional radiology RN under my direct supervision. IV Versed  plus fentanyl  were utilized. Continuous ECG, pulse oximetry and blood pressure was monitored throughout the entire procedure.  Conscious sedation was administered for a total of 30 minutes.  Sheath: 5 French 11 cm Pinnacle sheath retrograde right common femoral  Contrast: 40 cc   Fluoroscopy Time: 4.6 minutes  Indications:  The patient presents to [facility] with atherosclerotic occlusive disease bilateral lower extremities with increasing pain of her left lower extremity.  She recently had a right lower extremity angiogram with intervention and successful treatment of the gangrenous toe with amputation.  She is convinced that the same process is occurring on the left side and that she is having increased pain.  Pedal pulses are nonpalpable bilaterally suggesting hemodynamically significant atherosclerotic occlusive disease.  The risks and benefits as well as alternative therapies for lower extremity revascularization are reviewed with the patient all questions are answered the patient agrees to proceed.  The patient is therefore undergoing angiography with the hope for intervention for limb salvage.   Procedure:  Courtney [name] a 83 y.o. female who was identified and appropriate procedural time out was performed.  The patient was then  placed supine on the table and prepped and draped in the usual sterile fashion.  Ultrasound was used to evaluate the right common femoral artery.  It was echolucent and pulsatile indicating it is patent .  An ultrasound image was acquired for the permanent record.  A micropuncture needle was used to access the right common femoral artery under direct ultrasound guidance.  The microwire was then advanced under fluoroscopic guidance without difficulty followed by the micro-sheath.  A 0.035 J wire was advanced without resistance and a 5Fr sheath was placed.    Pigtail catheter was then advanced to above the bifurcation and RAO view of the pelvis was obtained. Stiff angled Glidewire and rim catheter was then used across the bifurcation and the catheter was positioned in the distal external iliac artery.  LAO of the left groin was then obtained. Wire was reintroduced and negotiated into the SFA and the catheter was advanced into the SFA. Distal runoff was then performed.  After review of the images the catheter was removed over wire and an RAO view of the groin was obtained. StarClose device was deployed without difficulty.   Findings:   Aortogram: The previous image of the abdominal aorta shows the aorta is patent without hemodynamically significant stenosis and single renal arteries were noted bilaterally with normal nephrograms.  No evidence of hemodynamically significant renal artery stenosis.  There are no hemodynamically significant stenoses identified within the aorta.  The aortic bifurcation is mildly diseased but widely patent.  Bilateral common internal and external iliac arteries are free of hemodynamically significant lesions.  Left lower Extremity: The left common femoral, profunda femoris superficial femoral and popliteal arteries demonstrate mild atherosclerotic changes but there are no hemodynamically significant lesions.  Trifurcation is patent but the tibioperoneal trunk is diseased  in the  posterior tibial and peroneal are occluded throughout their entire course.  There is single vessel runoff to the foot via the anterior tibial.  I do not identify a hemodynamically significant stenosis within the anterior tibial but there is diffuse atherosclerotic changes noted the dorsalis pedis fills as does the pedal arch.  SUMMARY: Based on these images no intervention is performed at this time.    Disposition: Patient was taken to the recovery room in stable condition having tolerated the procedure well.  Courtney [name] 06/05/2023,10:22 AM

## 2023-06-05 NOTE — Interval H&P Note (Signed)
 History and Physical Interval Note:  06/05/2023 9:30 AM  Courtney [name]  has presented today for surgery, with the diagnosis of LLE Angio   ASO w claudication.  The various methods of treatment have been discussed with the patient and family. After consideration of risks, benefits and other options for treatment, the patient has consented to  Procedure(s): Lower Extremity Angiography (Left) as a surgical intervention.  The patient's history has been reviewed, patient examined, no change in status, stable for surgery.  I have reviewed the patient's chart and labs.  Questions were answered to the patient's satisfaction.     Devon Fogo

## 2023-07-04 DIAGNOSIS — I70219 Atherosclerosis of native arteries of extremities with intermittent claudication, unspecified extremity: Secondary | ICD-10-CM | POA: Insufficient documentation

## 2023-07-04 NOTE — Progress Notes (Signed)
 MRN : [id number]  Courtney [name] is a 83 y.o. (February 09, 1940) female who presents with chief complaint of check circulation.  History of Present Illness:   The patient returns to the office for followup and review status post angiogram without intervention on 06/05/2023.   Procedure: Diagnostic left lower extremity angiogram patient has single-vessel tibial runoff to the foot via the anterior tibial.  The patient notes improvement in the lower extremity symptoms. No interval shortening of the patient's claudication distance or rest pain symptoms. No new ulcers or wounds have occurred since the last visit.  There have been no significant changes to the patient's overall health care.  No documented history of amaurosis fugax or recent TIA symptoms. There are no recent neurological changes noted. No documented history of DVT, PE or superficial thrombophlebitis. The patient denies recent episodes of angina or shortness of breath.    No outpatient medications have been marked as taking for the 07/05/23 encounter (Appointment) with Jama, Cordella MATSU, MD.    Past Medical History:  Diagnosis Date   B12 deficiency    Chronic urticaria    Dermatitis    Eczema    Fibrosis, pulmonary, interstitial, diffuse (HCC)    GERD (gastroesophageal reflux disease)    Headache    migraines in the past (none since menopause)   Heart murmur    History of hiatal hernia    Hypertension    Loose stools    Osteoarthritis of spine    Plantar fasciitis    Pneumonia    PONV (postoperative nausea and vomiting)    Psoriasis    Rheumatoid arthritis (HCC)    Rosacea    Sleep apnea 2004   does not use cpap   Squamous cell skin cancer    T2DM (type 2 diabetes mellitus) (HCC)    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    Varicose veins     Past Surgical History:  Procedure Laterality Date   ABDOMINAL AORTOGRAM  W/LOWER EXTREMITY Left 07/25/2022   Procedure: ABDOMINAL AORTOGRAM W/LOWER EXTREMITY;  Surgeon: Jama Cordella MATSU, MD;  Location: [facility];  Service: Cardiovascular;  Laterality: Left;   AMPUTATION TOE Right 03/16/2023   Procedure: AMPUTATION TOE IPJ X 2 Great toe and second toe;  Surgeon: Ashley Soulier, DPM;  Location: [facility];  Service: Orthopedics/Podiatry;  Laterality: Right;   ANTERIOR CERVICAL DECOMP/DISCECTOMY FUSION N/A 08/20/2017   Procedure: Anterior Cervical Decompression Fusion - Cervical Four - Cervical Five - Cervical Five - Cervical Six;  Surgeon: Louis Shove, MD;  Location: [facility] OR;  Service: Neurosurgery;  Laterality: N/A;  Anterior Cervical Decompression Fusion - Cervical Four - Cervical Five - Cervical Five - Cervical Six   APPENDECTOMY     BILATERAL CARPAL TUNNEL RELEASE     BREAST BIOPSY Left 04/24/2022   US  Bx, 6:00 heart clip, SUBAREOLAR BREAST TISSUE WITH RUPTURED DUCT ECTASIA.   BREAST BIOPSY Left 04/24/2022   US  bx, 4:00 coil clip, SUBAREOLAR BREAST TISSUE WITH AREAS OF STROMAL FIBROSIS.   BREAST BIOPSY Left 04/24/2022   US  LT  BREAST BX W LOC DEV 1ST LESION IMG BX SPEC US  GUIDE 04/24/2022 [facility]   BREAST BIOPSY Left 04/24/2022   US  LT BREAST BX W LOC DEV EA ADD LESION IMG BX SPEC US  GUIDE 04/24/2022 [facility]   BREAST BIOPSY WITH RADIO FREQUENCY LOCALIZER Left 06/19/2022   Procedure: BREAST BIOPSY WITH RADIO FREQUENCY LOCALIZER;  Surgeon: Rodolph Romano, MD;  Location: [facility];  Service: General;  Laterality: Left;   BREAST CYST ASPIRATION Left    neg   CATARACT EXTRACTION Bilateral    CHOLECYSTECTOMY  1963   COLONOSCOPY WITH PROPOFOL  N/A 12/31/2015   Procedure: COLONOSCOPY WITH PROPOFOL ;  Surgeon: Gladis RAYMOND Mariner, MD;  Location: [facility] ENDOSCOPY;  Service: Endoscopy;  Laterality: N/A;   ENDOMETRIAL ABLATION  1991   ESOPHAGOGASTRODUODENOSCOPY (EGD) WITH PROPOFOL  N/A 12/31/2015   Procedure: ESOPHAGOGASTRODUODENOSCOPY (EGD) WITH  PROPOFOL ;  Surgeon: Gladis RAYMOND Mariner, MD;  Location: [facility] ENDOSCOPY;  Service: Endoscopy;  Laterality: N/A;   ESOPHAGOGASTRODUODENOSCOPY (EGD) WITH PROPOFOL  N/A 06/02/2016   Procedure: ESOPHAGOGASTRODUODENOSCOPY (EGD) WITH PROPOFOL ;  Surgeon: Mariner Gladis RAYMOND, MD;  Location: [facility] ENDOSCOPY;  Service: Endoscopy;  Laterality: N/A;   ESOPHAGOGASTRODUODENOSCOPY (EGD) WITH PROPOFOL  N/A 11/07/2016   Procedure: ESOPHAGOGASTRODUODENOSCOPY (EGD) WITH PROPOFOL ;  Surgeon: Mariner Gladis RAYMOND, MD;  Location: [facility] ENDOSCOPY;  Service: Endoscopy;  Laterality: N/A;   EYE SURGERY Bilateral 2006 and 2012   cataract surgery with lens implant   eyelid surgery Bilateral 2014   FOOT SURGERY Right    ligament and spurs   LOWER EXTREMITY ANGIOGRAPHY Right 03/07/2023   Procedure: Lower Extremity Angiography;  Surgeon: Marea Selinda RAMAN, MD;  Location: [facility];  Service: Cardiovascular;  Laterality: Right;   LOWER EXTREMITY ANGIOGRAPHY Left 06/05/2023   Procedure: Lower Extremity Angiography;  Surgeon: Jama Cordella MATSU, MD;  Location: [facility];  Service: Cardiovascular;  Laterality: Left;   LUMBAR LAMINECTOMY/DECOMPRESSION MICRODISCECTOMY Right 12/24/2013   Procedure: RIGHT LUMBAR THREE TO FOUR, LUMBAR FOUR TO FIVE, LUMBAR FIVE TO SACRAL ONE LAMINECTOMY/FORAMINOTOMY;  Surgeon: Catalina CHRISTELLA Stains, MD;  Location: [facility];  Service: Neurosurgery;  Laterality: Right;  RIGHT L3-4 L4-5 L5-S1 LAMINECTOMY/FORAMINOTOMY   NASAL SEPTUM SURGERY  2004   SHOULDER SURGERY Right    for a frozen shoulder   SKIN CANCER EXCISION Right    leg x 4   skin cancer removal     TONSILLECTOMY     TUBAL LIGATION  1968    Social History Social History   Tobacco Use   Smoking status: Never   Smokeless tobacco: Never  Vaping Use   Vaping status: Never Used  Substance Use Topics   Alcohol use: No   Drug use: No    Family History Family History  Problem Relation Age of Onset   COPD Mother     Arthritis/Rheumatoid Mother    Alzheimer's disease Father    Heart attack Father    Breast cancer Paternal Aunt 57    Allergies  Allergen Reactions   Methotrexate Derivatives Shortness Of Breath    Breathing difficulties    Acyclovir And Related Rash   Codeine Nausea Only   Contrast Media [Iodinated Contrast Media] Itching and Rash   Hydroxychloroquine Rash   Inderide [Propranolol-Hctz] Rash   Lodine [Etodolac] Rash   Metrizamide Itching and Rash   Procardia [Nifedipine] Rash   Propranolol Hcl Rash     REVIEW OF SYSTEMS (Negative unless checked)  Constitutional: [] Weight loss  [] Fever  [] Chills Cardiac: [] Chest pain   [] Chest pressure   [] Palpitations   []   Shortness of breath when laying flat   [] Shortness of breath with exertion. Vascular:  [x] Pain in legs with walking   [] Pain in legs at rest  [] History of DVT   [] Phlebitis   [] Swelling in legs   [] Varicose veins   [] Non-healing ulcers Pulmonary:   [] Uses home oxygen   [] Productive cough   [] Hemoptysis   [] Wheeze  [] COPD   [] Asthma Neurologic:  [] Dizziness   [] Seizures   [] History of stroke   [] History of TIA  [] Aphasia   [] Vissual changes   [] Weakness or numbness in arm   [] Weakness or numbness in leg Musculoskeletal:   [] Joint swelling   [] Joint pain   [] Low back pain Hematologic:  [] Easy bruising  [] Easy bleeding   [] Hypercoagulable state   [] Anemic Gastrointestinal:  [] Diarrhea   [] Vomiting  [] Gastroesophageal reflux/heartburn   [] Difficulty swallowing. Genitourinary:  [] Chronic kidney disease   [] Difficult urination  [] Frequent urination   [] Blood in urine Skin:  [] Rashes   [] Ulcers  Psychological:  [] History of anxiety   []  History of major depression.  Physical Examination  There were no vitals filed for this visit. There is no height or weight on file to calculate BMI. Gen: WD/WN, NAD Head: [place]/AT, No temporalis wasting.  Ear/Nose/Throat: Hearing grossly intact, nares w/o erythema or drainage Eyes: PER, EOMI, sclera  nonicteric.  Neck: Supple, no masses.  No bruit or JVD.  Pulmonary:  Good air movement, no audible wheezing, no use of accessory muscles.  Cardiac: RRR, normal S1, S2, no Murmurs. Vascular:  mild trophic changes,  wounds on the right first second toes have healed Vessel Right Left  Radial Palpable Palpable  PT Not Palpable Not Palpable  DP Not Palpable Not Palpable  Gastrointestinal: soft, non-distended. No guarding/no peritoneal signs.  Musculoskeletal: M/S 5/5 throughout.  No visible deformity.  Neurologic: CN 2-12 intact. Pain and light touch intact in extremities.  Symmetrical.  Speech is fluent. Motor exam as listed above. Psychiatric: Judgment intact, Mood & affect appropriate for pt's clinical situation. Dermatologic: No rashes or ulcers noted.  No changes consistent with cellulitis.   CBC Lab Results  Component Value Date   WBC 5.1 07/27/2022   HGB 10.4 (L) 07/27/2022   HCT 31.5 (L) 07/27/2022   MCV 87.7 07/27/2022   PLT 93 (L) 07/27/2022    BMET    Component Value Date/Time   NA 137 03/14/2023 1441   K 3.3 (L) 03/14/2023 1441   CL 105 03/14/2023 1441   CO2 24 03/14/2023 1441   GLUCOSE 87 03/14/2023 1441   BUN 66 (H) 06/05/2023 0734   CREATININE 1.43 (H) 06/05/2023 0734   CALCIUM 9.5 03/14/2023 1441   GFRNONAA 36 (L) 06/05/2023 0734   GFRAA >60 08/13/2017 1430   CrCl cannot be calculated (Patient's most recent lab result is older than the maximum 21 days allowed.).  COAG Lab Results  Component Value Date   INR 1.22 12/25/2014    Radiology PERIPHERAL VASCULAR CATHETERIZATION Result Date: 06/05/2023 See surgical note for result.    Assessment/Plan 1. Atherosclerosis of native artery of both lower extremities with intermittent claudication (HCC) (Primary)  Recommend:  The patient has evidence of atherosclerosis of the lower extremities with claudication.  The patient does not voice lifestyle limiting changes at this point in time.  Noninvasive studies  do not suggest clinically significant change.  No invasive studies, angiography or surgery at this time The patient should continue walking and begin a more formal exercise program.  The patient should continue antiplatelet  therapy and aggressive treatment of the lipid abnormalities  No changes in the patient's medications at this time  Continued surveillance is indicated as atherosclerosis is likely to progress with time.    The patient will continue follow up with noninvasive studies as ordered.   2. Essential hypertension Continue antihypertensive medications as already ordered, these medications have been reviewed and there are no changes at this time.  3. Type 2 diabetes mellitus without complication, without long-term current use of insulin  (HCC) Continue hypoglycemic medications as already ordered, these medications have been reviewed and there are no changes at this time.  Hgb A1C to be monitored as already arranged by primary service  4. Degeneration of intervertebral disc of lumbar region, unspecified whether pain present Continue medications to treat the patient's degenerative disease as already ordered, these medications have been reviewed and there are no changes at this time.  Continued activity and therapy was stressed. d   Cordella Shawl, MD  07/04/2023 1:16 PM

## 2023-07-05 ENCOUNTER — Ambulatory Visit (INDEPENDENT_AMBULATORY_CARE_PROVIDER_SITE_OTHER): Admitting: Vascular Surgery

## 2023-07-05 ENCOUNTER — Encounter (INDEPENDENT_AMBULATORY_CARE_PROVIDER_SITE_OTHER): Payer: Self-pay | Admitting: Vascular Surgery

## 2023-07-05 VITALS — BP 153/63 | HR 67 | Ht 63.0 in | Wt 171.4 lb

## 2023-07-05 DIAGNOSIS — M51369 Other intervertebral disc degeneration, lumbar region without mention of lumbar back pain or lower extremity pain: Secondary | ICD-10-CM | POA: Diagnosis not present

## 2023-07-05 DIAGNOSIS — I10 Essential (primary) hypertension: Secondary | ICD-10-CM | POA: Diagnosis not present

## 2023-07-05 DIAGNOSIS — E119 Type 2 diabetes mellitus without complications: Secondary | ICD-10-CM

## 2023-07-05 DIAGNOSIS — I70213 Atherosclerosis of native arteries of extremities with intermittent claudication, bilateral legs: Secondary | ICD-10-CM | POA: Diagnosis not present

## 2023-07-08 ENCOUNTER — Encounter (INDEPENDENT_AMBULATORY_CARE_PROVIDER_SITE_OTHER): Payer: Self-pay | Admitting: Vascular Surgery

## 2023-09-14 ENCOUNTER — Inpatient Hospital Stay: Attending: Oncology | Admitting: Oncology

## 2023-09-14 ENCOUNTER — Inpatient Hospital Stay

## 2023-09-14 ENCOUNTER — Encounter: Payer: Self-pay | Admitting: Oncology

## 2023-09-14 VITALS — BP 118/66 | HR 80 | Temp 98.6°F | Resp 18 | Ht 63.0 in | Wt 182.0 lb

## 2023-09-14 DIAGNOSIS — Z79899 Other long term (current) drug therapy: Secondary | ICD-10-CM | POA: Insufficient documentation

## 2023-09-14 DIAGNOSIS — D649 Anemia, unspecified: Secondary | ICD-10-CM | POA: Diagnosis not present

## 2023-09-14 DIAGNOSIS — Z803 Family history of malignant neoplasm of breast: Secondary | ICD-10-CM | POA: Diagnosis not present

## 2023-09-14 DIAGNOSIS — D696 Thrombocytopenia, unspecified: Secondary | ICD-10-CM

## 2023-09-14 DIAGNOSIS — M069 Rheumatoid arthritis, unspecified: Secondary | ICD-10-CM | POA: Diagnosis not present

## 2023-09-14 LAB — CBC (CANCER CENTER ONLY)
HCT: 29.7 % — ABNORMAL LOW (ref 36.0–46.0)
Hemoglobin: 9.2 g/dL — ABNORMAL LOW (ref 12.0–15.0)
MCH: 26.6 pg (ref 26.0–34.0)
MCHC: 31 g/dL (ref 30.0–36.0)
MCV: 85.8 fL (ref 80.0–100.0)
Platelet Count: 92 K/uL — ABNORMAL LOW (ref 150–400)
RBC: 3.46 MIL/uL — ABNORMAL LOW (ref 3.87–5.11)
RDW: 13.5 % (ref 11.5–15.5)
WBC Count: 4.4 K/uL (ref 4.0–10.5)
nRBC: 0 % (ref 0.0–0.2)

## 2023-09-14 LAB — IRON AND TIBC
Iron: 33 ug/dL (ref 28–170)
Saturation Ratios: 8 % — ABNORMAL LOW (ref 10.4–31.8)
TIBC: 405 ug/dL (ref 250–450)
UIBC: 372 ug/dL

## 2023-09-14 LAB — LACTATE DEHYDROGENASE: LDH: 178 U/L (ref 98–192)

## 2023-09-14 LAB — RETICULOCYTES
Immature Retic Fract: 17.2 % — ABNORMAL HIGH (ref 2.3–15.9)
RBC.: 3.47 MIL/uL — ABNORMAL LOW (ref 3.87–5.11)
Retic Count, Absolute: 65.6 K/uL (ref 19.0–186.0)
Retic Ct Pct: 1.9 % (ref 0.4–3.1)

## 2023-09-14 LAB — FERRITIN: Ferritin: 11 ng/mL (ref 11–307)

## 2023-09-14 NOTE — Progress Notes (Signed)
 Patient stays so tired and cold. Constipation, uses stool softer.

## 2023-09-14 NOTE — Progress Notes (Signed)
 [facility]  Telephone:(336) [phone] Fax:(336) [phone]  ID: Recardo DELENA Bars OB: 09/09/1940  MR#: [id number]  RDW#:[id number]  Patient Care Team: Cleotilde Oneil FALCON, MD as PCP - General (Internal Medicine)  CHIEF COMPLAINT: Anemia, thrombocytopenia.  INTERVAL HISTORY: Patient is an 83 year old female was noted to have chronic anemia and thrombocytopenia.  She currently feels well and at her baseline.  She continues to take Remicade  and Arava  for her rheumatoid arthritis.  She has no neurologic complaints.  She denies any recent fevers or illnesses.  She has good appetite and denies weight loss.  She has no chest pain, shortness of breath, cough, or hemoptysis.  She denies any nausea, vomiting, constipation, or diarrhea.  She has no melena or hematochezia.  She has no urinary complaints.  Patient offers no further specific complaints today.  REVIEW OF SYSTEMS:   Review of Systems  Constitutional: Negative.  Negative for fever, malaise/fatigue and weight loss.  Respiratory: Negative.  Negative for cough, hemoptysis and shortness of breath.   Cardiovascular: Negative.  Negative for chest pain and leg swelling.  Gastrointestinal: Negative.  Negative for abdominal pain, blood in stool and melena.  Genitourinary: Negative.  Negative for dysuria.  Musculoskeletal: Negative.  Negative for back pain.  Skin: Negative.  Negative for rash.  Neurological: Negative.  Negative for dizziness, focal weakness, weakness and headaches.  Psychiatric/Behavioral: Negative.  The patient is not nervous/anxious.     As per HPI. Otherwise, a complete review of systems is negative.  PAST MEDICAL HISTORY: Past Medical History:  Diagnosis Date   B12 deficiency    Chronic urticaria    Dermatitis    Eczema    Fibrosis, pulmonary, interstitial, diffuse (HCC)    GERD (gastroesophageal reflux disease)    Headache    migraines in the past (none since menopause)   Heart murmur    History of hiatal  hernia    Hypertension    Loose stools    Osteoarthritis of spine    Plantar fasciitis    Pneumonia    PONV (postoperative nausea and vomiting)    Psoriasis    Rheumatoid arthritis (HCC)    Rosacea    Sleep apnea 2004   does not use cpap   Squamous cell skin cancer    T2DM (type 2 diabetes mellitus) (HCC)    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    Varicose veins     PAST SURGICAL HISTORY: Past Surgical History:  Procedure Laterality Date   ABDOMINAL AORTOGRAM W/LOWER EXTREMITY Left 07/25/2022   Procedure: ABDOMINAL AORTOGRAM W/LOWER EXTREMITY;  Surgeon: Jama Cordella MATSU, MD;  Location: [facility];  Service: Cardiovascular;  Laterality: Left;   AMPUTATION TOE Right 03/16/2023   Procedure: AMPUTATION TOE IPJ X 2 Great toe and second toe;  Surgeon: Ashley Soulier, DPM;  Location: [facility];  Service: Orthopedics/Podiatry;  Laterality: Right;   ANTERIOR CERVICAL DECOMP/DISCECTOMY FUSION N/A 08/20/2017   Procedure: Anterior Cervical Decompression Fusion - Cervical Four - Cervical Five - Cervical Five - Cervical Six;  Surgeon: Louis Shove, MD;  Location: [facility] OR;  Service: Neurosurgery;  Laterality: N/A;  Anterior Cervical Decompression Fusion - Cervical Four - Cervical Five - Cervical Five - Cervical Six   APPENDECTOMY     BILATERAL CARPAL TUNNEL RELEASE     BREAST BIOPSY Left 04/24/2022   US  Bx, 6:00 heart clip, SUBAREOLAR BREAST TISSUE WITH RUPTURED DUCT ECTASIA.   BREAST BIOPSY Left 04/24/2022   US   bx, 4:00 coil clip, SUBAREOLAR BREAST TISSUE WITH AREAS OF STROMAL FIBROSIS.   BREAST BIOPSY Left 04/24/2022   US  LT BREAST BX W LOC DEV 1ST LESION IMG BX SPEC US  GUIDE 04/24/2022 [facility]   BREAST BIOPSY Left 04/24/2022   US  LT BREAST BX W LOC DEV EA ADD LESION IMG BX SPEC US  GUIDE 04/24/2022 [facility]   BREAST BIOPSY WITH RADIO FREQUENCY LOCALIZER Left 06/19/2022   Procedure: BREAST BIOPSY WITH RADIO FREQUENCY LOCALIZER;  Surgeon:  Rodolph Romano, MD;  Location: [facility];  Service: General;  Laterality: Left;   BREAST CYST ASPIRATION Left    neg   CATARACT EXTRACTION Bilateral    CHOLECYSTECTOMY  1963   COLONOSCOPY WITH PROPOFOL  N/A 12/31/2015   Procedure: COLONOSCOPY WITH PROPOFOL ;  Surgeon: Gladis RAYMOND Mariner, MD;  Location: [facility] ENDOSCOPY;  Service: Endoscopy;  Laterality: N/A;   ENDOMETRIAL ABLATION  1991   ESOPHAGOGASTRODUODENOSCOPY (EGD) WITH PROPOFOL  N/A 12/31/2015   Procedure: ESOPHAGOGASTRODUODENOSCOPY (EGD) WITH PROPOFOL ;  Surgeon: Gladis RAYMOND Mariner, MD;  Location: [facility] ENDOSCOPY;  Service: Endoscopy;  Laterality: N/A;   ESOPHAGOGASTRODUODENOSCOPY (EGD) WITH PROPOFOL  N/A 06/02/2016   Procedure: ESOPHAGOGASTRODUODENOSCOPY (EGD) WITH PROPOFOL ;  Surgeon: Mariner Gladis RAYMOND, MD;  Location: [facility] ENDOSCOPY;  Service: Endoscopy;  Laterality: N/A;   ESOPHAGOGASTRODUODENOSCOPY (EGD) WITH PROPOFOL  N/A 11/07/2016   Procedure: ESOPHAGOGASTRODUODENOSCOPY (EGD) WITH PROPOFOL ;  Surgeon: Mariner Gladis RAYMOND, MD;  Location: [facility] ENDOSCOPY;  Service: Endoscopy;  Laterality: N/A;   EYE SURGERY Bilateral 2006 and 2012   cataract surgery with lens implant   eyelid surgery Bilateral 2014   FOOT SURGERY Right    ligament and spurs   LOWER EXTREMITY ANGIOGRAPHY Right 03/07/2023   Procedure: Lower Extremity Angiography;  Surgeon: Marea Selinda RAMAN, MD;  Location: [facility];  Service: Cardiovascular;  Laterality: Right;   LOWER EXTREMITY ANGIOGRAPHY Left 06/05/2023   Procedure: Lower Extremity Angiography;  Surgeon: Jama Cordella MATSU, MD;  Location: [facility];  Service: Cardiovascular;  Laterality: Left;   LUMBAR LAMINECTOMY/DECOMPRESSION MICRODISCECTOMY Right 12/24/2013   Procedure: RIGHT LUMBAR THREE TO FOUR, LUMBAR FOUR TO FIVE, LUMBAR FIVE TO SACRAL ONE LAMINECTOMY/FORAMINOTOMY;  Surgeon: Catalina CHRISTELLA Stains, MD;  Location: [facility];  Service: Neurosurgery;  Laterality: Right;  RIGHT L3-4 L4-5 L5-S1  LAMINECTOMY/FORAMINOTOMY   NASAL SEPTUM SURGERY  2004   SHOULDER SURGERY Right    for a frozen shoulder   SKIN CANCER EXCISION Right    leg x 4   skin cancer removal     TONSILLECTOMY     TUBAL LIGATION  1968    FAMILY HISTORY: Family History  Problem Relation Age of Onset   COPD Mother    Arthritis/Rheumatoid Mother    Alzheimer's disease Father    Heart attack Father    Breast cancer Paternal Aunt 9    ADVANCED DIRECTIVES (Y/N):  N  HEALTH MAINTENANCE: Social History   Tobacco Use   Smoking status: Never   Smokeless tobacco: Never  Vaping Use   Vaping status: Never Used  Substance Use Topics   Alcohol use: No   Drug use: No     Colonoscopy:  PAP:  Bone density:  Lipid panel:  Allergies  Allergen Reactions   Methotrexate Derivatives Shortness Of Breath    Breathing difficulties    Acyclovir And Related Rash   Codeine Nausea Only   Contrast Media [Iodinated Contrast Media] Itching and Rash   Hydroxychloroquine Rash   Inderide [Propranolol-Hctz] Rash   Lodine [Etodolac] Rash   Metrizamide Itching and  Rash   Procardia [Nifedipine] Rash   Propranolol Hcl Rash    Current Outpatient Medications  Medication Sig Dispense Refill   acetaminophen  (TYLENOL ) 500 MG tablet Take 500 mg by mouth every 6 (six) hours as needed (for pain.).      aspirin  EC 81 MG tablet Take 81 mg by mouth daily.     bisoprolol  (ZEBETA ) 10 MG tablet Take 1 tablet (10 mg total) by mouth daily. 30 tablet 1   cetirizine (ZYRTEC) 10 MG tablet Take 10 mg by mouth at bedtime.      Cholecalciferol  (D3) 25 MCG (1000 UT) capsule Take 1,000 Units by mouth daily.     clopidogrel  (PLAVIX ) 75 MG tablet Take 1 tablet (75 mg total) by mouth daily. 30 tablet 11   famotidine  (PEPCID ) 40 MG tablet Take 40 mg by mouth daily.     furosemide (LASIX) 20 MG tablet Take 20 mg by mouth 2 (two) times daily.     gabapentin  (NEURONTIN ) 100 MG capsule Take 100 mg by mouth 2 (two) times daily.     inFLIXimab   (REMICADE ) 100 MG injection Inject 500 mg into the vein every 6 (six) weeks.     insulin  NPH Human (HUMULIN  N,NOVOLIN N) 100 UNIT/ML injection Inject 0.4 mLs (40 Units total) into the skin daily before breakfast. 10 mL 11   leflunomide  (ARAVA ) 10 MG tablet Take 10 mg by mouth daily.     montelukast  (SINGULAIR ) 10 MG tablet Take 10 mg by mouth daily.     pantoprazole  (PROTONIX ) 40 MG tablet Take 40 mg by mouth 2 (two) times daily.     rOPINIRole  (REQUIP ) 0.5 MG tablet Take 0.5 mg by mouth at bedtime as needed (RLS).     simvastatin  (ZOCOR ) 40 MG tablet Take 40 mg by mouth at bedtime.     No current facility-administered medications for this visit.    OBJECTIVE: Vitals:   09/14/23 1140  BP: 118/66  Pulse: 80  Resp: 18  Temp: 98.6 F (37 C)  SpO2: 98%     Body mass index is 32.24 kg/m.    ECOG FS:1 - Symptomatic but completely ambulatory  General: Well-developed, well-nourished, no acute distress.  Sitting in a wheelchair. Eyes: Pink conjunctiva, anicteric sclera. HEENT: Normocephalic, moist mucous membranes. Lungs: No audible wheezing or coughing. Heart: Regular rate and rhythm. Abdomen: Soft, nontender, no obvious distention. Musculoskeletal: No edema, cyanosis, or clubbing. Neuro: Alert, answering all questions appropriately. Cranial nerves grossly intact. Skin: No rashes or petechiae noted. Psych: Normal affect. Lymphatics: No cervical, calvicular, axillary or inguinal LAD.   LAB RESULTS:  Lab Results  Component Value Date   NA 137 03/14/2023   K 3.3 (L) 03/14/2023   CL 105 03/14/2023   CO2 24 03/14/2023   GLUCOSE 87 03/14/2023   BUN 66 (H) 06/05/2023   CREATININE 1.43 (H) 06/05/2023   CALCIUM 9.5 03/14/2023   PROT 7.1 07/24/2022   ALBUMIN  3.2 (L) 07/24/2022   AST 21 07/24/2022   ALT 14 07/24/2022   ALKPHOS 120 07/24/2022   BILITOT 0.6 07/24/2022   GFRNONAA 36 (L) 06/05/2023   GFRAA >60 08/13/2017    Lab Results  Component Value Date   WBC 4.4 09/14/2023    NEUTROABS 2.0 07/27/2022   HGB 9.2 (L) 09/14/2023   HCT 29.7 (L) 09/14/2023   MCV 85.8 09/14/2023   PLT 92 (L) 09/14/2023     STUDIES: No results found.  ASSESSMENT: Anemia, thrombocytopenia.  PLAN:    Thrombocytopenia: Upon review of  patient's chart she has had intermittently decreased platelet count since at least December 2016 ranging between 92 and 155.  Her results today are 73.  All of her other laboratory work is pending at time of dictation.  No intervention is needed at this time.  Patient does not require bone marrow biopsy.  Return to clinic in 3 weeks for further evaluation and discussion of her laboratory results. Anemia: Patient's hemoglobin is 9.2 today.  Previously, B12 and folate were within normal limits.  Iron stores are pending at time of dictation.  No intervention is needed.  Follow-up as above.  I spent a total of 45 minutes reviewing chart data, face-to-face evaluation with the patient, counseling and coordination of care as detailed above.   Patient expressed understanding and was in agreement with this plan. She also understands that She can call clinic at any time with any questions, concerns, or complaints.    Evalene JINNY Reusing, MD   09/14/2023 1:45 PM

## 2023-09-15 LAB — PLATELET ANTIBODY PROFILE
Glycoprotein IV Antibody: NEGATIVE
HLA Ab Ser Ql EIA: NEGATIVE
IA/IIA Antibody: NEGATIVE
IB/IX Antibody: NEGATIVE
IIB/IIIA Antibody: NEGATIVE

## 2023-09-16 LAB — HAPTOGLOBIN: Haptoglobin: 127 mg/dL (ref 41–333)

## 2023-10-02 ENCOUNTER — Other Ambulatory Visit: Payer: Self-pay | Admitting: Specialist

## 2023-10-02 DIAGNOSIS — R918 Other nonspecific abnormal finding of lung field: Secondary | ICD-10-CM

## 2023-10-03 ENCOUNTER — Inpatient Hospital Stay (HOSPITAL_BASED_OUTPATIENT_CLINIC_OR_DEPARTMENT_OTHER): Admitting: Oncology

## 2023-10-03 ENCOUNTER — Encounter: Payer: Self-pay | Admitting: Oncology

## 2023-10-03 VITALS — BP 148/80 | HR 89 | Temp 97.7°F | Resp 18 | Ht 63.0 in | Wt 176.0 lb

## 2023-10-03 DIAGNOSIS — D696 Thrombocytopenia, unspecified: Secondary | ICD-10-CM | POA: Diagnosis not present

## 2023-10-03 DIAGNOSIS — D509 Iron deficiency anemia, unspecified: Secondary | ICD-10-CM | POA: Diagnosis not present

## 2023-10-03 NOTE — Progress Notes (Unsigned)
 [facility]  [phone] [phone]  ID: Courtney [name] OB: [birth year]  MR#: [id number]  RDW#:[id number]  Patient Care Team: Cleotilde Oneil FALCON, MD as PCP - General (Internal Medicine)  CHIEF COMPLAINT: Iron deficiency anemia, thrombocytopenia.  INTERVAL HISTORY: Patient returns to clinic today for further evaluation, discussion of her laboratory results, and consideration of IV Venofer.  P he continues to feel well and at her baseline.  She continues to take Remicade  and Arava  for her rheumatoid arthritis.  She has no neurologic complaints.  She denies any recent fevers or illnesses.  She has good appetite and denies weight loss.  She has no chest pain, shortness of breath, cough, or hemoptysis.  She denies any nausea, vomiting, constipation, or diarrhea.  She has no melena or hematochezia.  She has no urinary complaints.  Patient offers no further specific complaints today.  REVIEW OF SYSTEMS:   Review of Systems  Constitutional: Negative.  Negative for fever, malaise/fatigue and weight loss.  Respiratory: Negative.  Negative for cough, hemoptysis and shortness of breath.   Cardiovascular: Negative.  Negative for chest pain and leg swelling.  Gastrointestinal: Negative.  Negative for abdominal pain, blood in stool and melena.  Genitourinary: Negative.  Negative for dysuria.  Musculoskeletal: Negative.  Negative for back pain.  Skin: Negative.  Negative for rash.  Neurological: Negative.  Negative for dizziness, focal weakness, weakness and headaches.  Psychiatric/Behavioral: Negative.  The patient is not nervous/anxious.     As per HPI. Otherwise, a complete review of systems is negative.  PAST MEDICAL HISTORY: Past Medical History:  Diagnosis Date   B12 deficiency    Chronic urticaria    Dermatitis    Eczema    Fibrosis, pulmonary, interstitial, diffuse (HCC)    GERD (gastroesophageal reflux disease)    Headache    migraines in the past  (none since menopause)   Heart murmur    History of hiatal hernia    Hypertension    Loose stools    Osteoarthritis of spine    Plantar fasciitis    Pneumonia    PONV (postoperative nausea and vomiting)    Psoriasis    Rheumatoid arthritis (HCC)    Rosacea    Sleep apnea 2004   does not use cpap   Squamous cell skin cancer    T2DM (type 2 diabetes mellitus) (HCC)    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    Varicose veins     PAST SURGICAL HISTORY: Past Surgical History:  Procedure Laterality Date   ABDOMINAL AORTOGRAM W/LOWER EXTREMITY Left 07/25/2022   Procedure: ABDOMINAL AORTOGRAM W/LOWER EXTREMITY;  Surgeon: Jama Cordella MATSU, MD;  Location: [facility];  Service: Cardiovascular;  Laterality: Left;   AMPUTATION TOE Right 03/16/2023   Procedure: AMPUTATION TOE IPJ X 2 Great toe and second toe;  Surgeon: Ashley Soulier, DPM;  Location: [facility] OR[facility];  Service: Orthopedics/Podiatry;  Laterality: Right;   ANTERIOR CERVICAL DECOMP/DISCECTOMY FUSION N/A 08/20/2017   Procedure: Anterior Cervical Decompression Fusion - Cervical Four - Cervical Five - Cervical Five - Cervical Six;  Surgeon: Louis Shove, MD;  Location: [facility] OR;  Service: Neurosurgery;  Laterality: N/A;  Anterior Cervical Decompression Fusion - Cervical Four - Cervical Five - Cervical Five - Cervical Six   APPENDECTOMY     BILATERAL CARPAL TUNNEL RELEASE     BREAST BIOPSY Left 04/24/2022   US  Bx, 6:00 heart clip, SUBAREOLAR BREAST TISSUE WITH RUPTURED DUCT ECTASIA.  BREAST BIOPSY Left 04/24/2022   US  bx, 4:00 coil clip, SUBAREOLAR BREAST TISSUE WITH AREAS OF STROMAL FIBROSIS.   BREAST BIOPSY Left 04/24/2022   US  LT BREAST BX W LOC DEV 1ST LESION IMG BX SPEC US  GUIDE 04/24/2022 [facility]   BREAST BIOPSY Left 04/24/2022   US  LT BREAST BX W LOC DEV EA ADD LESION IMG BX SPEC US  GUIDE 04/24/2022 [facility]   BREAST BIOPSY WITH RADIO FREQUENCY LOCALIZER Left 06/19/2022   Procedure:  BREAST BIOPSY WITH RADIO FREQUENCY LOCALIZER;  Surgeon: Rodolph Romano, MD;  Location: [facility];  Service: General;  Laterality: Left;   BREAST CYST ASPIRATION Left    neg   CATARACT EXTRACTION Bilateral    CHOLECYSTECTOMY  1963   COLONOSCOPY WITH PROPOFOL  N/A 12/31/2015   Procedure: COLONOSCOPY WITH PROPOFOL ;  Surgeon: Gladis RAYMOND Mariner, MD;  Location: [facility] ENDOSCOPY;  Service: Endoscopy;  Laterality: N/A;   ENDOMETRIAL ABLATION  1991   ESOPHAGOGASTRODUODENOSCOPY (EGD) WITH PROPOFOL  N/A 12/31/2015   Procedure: ESOPHAGOGASTRODUODENOSCOPY (EGD) WITH PROPOFOL ;  Surgeon: Gladis RAYMOND Mariner, MD;  Location: [facility] ENDOSCOPY;  Service: Endoscopy;  Laterality: N/A;   ESOPHAGOGASTRODUODENOSCOPY (EGD) WITH PROPOFOL  N/A 06/02/2016   Procedure: ESOPHAGOGASTRODUODENOSCOPY (EGD) WITH PROPOFOL ;  Surgeon: Mariner Gladis RAYMOND, MD;  Location: [facility] ENDOSCOPY;  Service: Endoscopy;  Laterality: N/A;   ESOPHAGOGASTRODUODENOSCOPY (EGD) WITH PROPOFOL  N/A 11/07/2016   Procedure: ESOPHAGOGASTRODUODENOSCOPY (EGD) WITH PROPOFOL ;  Surgeon: Mariner Gladis RAYMOND, MD;  Location: [facility] ENDOSCOPY;  Service: Endoscopy;  Laterality: N/A;   EYE SURGERY Bilateral 2006 and 2012   cataract surgery with lens implant   eyelid surgery Bilateral 2014   FOOT SURGERY Right    ligament and spurs   LOWER EXTREMITY ANGIOGRAPHY Right 03/07/2023   Procedure: Lower Extremity Angiography;  Surgeon: Marea Selinda RAMAN, MD;  Location: [facility];  Service: Cardiovascular;  Laterality: Right;   LOWER EXTREMITY ANGIOGRAPHY Left 06/05/2023   Procedure: Lower Extremity Angiography;  Surgeon: Jama Cordella MATSU, MD;  Location: [facility];  Service: Cardiovascular;  Laterality: Left;   LUMBAR LAMINECTOMY/DECOMPRESSION MICRODISCECTOMY Right 12/24/2013   Procedure: RIGHT LUMBAR THREE TO FOUR, LUMBAR FOUR TO FIVE, LUMBAR FIVE TO SACRAL ONE LAMINECTOMY/FORAMINOTOMY;  Surgeon: Catalina CHRISTELLA Stains, MD;  Location: [facility];  Service:  Neurosurgery;  Laterality: Right;  RIGHT L3-4 L4-5 L5-S1 LAMINECTOMY/FORAMINOTOMY   NASAL SEPTUM SURGERY  2004   SHOULDER SURGERY Right    for a frozen shoulder   SKIN CANCER EXCISION Right    leg x 4   skin cancer removal     TONSILLECTOMY     TUBAL LIGATION  1968    FAMILY HISTORY: Family History  Problem Relation Age of Onset   COPD Mother    Arthritis/Rheumatoid Mother    Alzheimer's disease Father    Heart attack Father    Breast cancer Paternal Aunt 26    ADVANCED DIRECTIVES (Y/N):  N  HEALTH MAINTENANCE: Social History   Tobacco Use   Smoking status: Never   Smokeless tobacco: Never  Vaping Use   Vaping status: Never Used  Substance Use Topics   Alcohol use: No   Drug use: No     Colonoscopy:  PAP:  Bone density:  Lipid panel:  Allergies  Allergen Reactions   Methotrexate Derivatives Shortness Of Breath    Breathing difficulties    Acyclovir And Related Rash   Codeine Nausea Only   Contrast Media [Iodinated Contrast Media] Itching and Rash   Hydroxychloroquine Rash   Inderide [Propranolol-Hctz] Rash   Lodine [  Etodolac] Rash   Metrizamide Itching and Rash   Procardia [Nifedipine] Rash   Propranolol Hcl Rash    Current Outpatient Medications  Medication Sig Dispense Refill   acetaminophen  (TYLENOL ) 500 MG tablet Take 500 mg by mouth every 6 (six) hours as needed (for pain.).      aspirin  EC 81 MG tablet Take 81 mg by mouth daily.     bisoprolol  (ZEBETA ) 10 MG tablet Take 1 tablet (10 mg total) by mouth daily. 30 tablet 1   cetirizine (ZYRTEC) 10 MG tablet Take 10 mg by mouth at bedtime.      Cholecalciferol  (D3) 25 MCG (1000 UT) capsule Take 1,000 Units by mouth daily.     clopidogrel  (PLAVIX ) 75 MG tablet Take 1 tablet (75 mg total) by mouth daily. 30 tablet 11   famotidine  (PEPCID ) 40 MG tablet Take 40 mg by mouth daily.     furosemide (LASIX) 20 MG tablet Take 20 mg by mouth 2 (two) times daily.     gabapentin  (NEURONTIN ) 100 MG capsule Take  100 mg by mouth 2 (two) times daily.     inFLIXimab  (REMICADE ) 100 MG injection Inject 500 mg into the vein every 6 (six) weeks.     insulin  NPH Human (HUMULIN  N,NOVOLIN N) 100 UNIT/ML injection Inject 0.4 mLs (40 Units total) into the skin daily before breakfast. 10 mL 11   leflunomide  (ARAVA ) 10 MG tablet Take 10 mg by mouth daily.     montelukast  (SINGULAIR ) 10 MG tablet Take 10 mg by mouth daily.     pantoprazole  (PROTONIX ) 40 MG tablet Take 40 mg by mouth 2 (two) times daily.     rOPINIRole  (REQUIP ) 0.5 MG tablet Take 0.5 mg by mouth at bedtime as needed (RLS).     simvastatin  (ZOCOR ) 40 MG tablet Take 40 mg by mouth at bedtime.     No current facility-administered medications for this visit.    OBJECTIVE: Vitals:   10/03/23 1428  BP: (!) 148/80  Pulse: 89  Resp: 18  Temp: 97.7 F (36.5 C)  SpO2: 98%     Body mass index is 31.18 kg/m.    ECOG FS:1 - Symptomatic but completely ambulatory  General: Well-developed, well-nourished, no acute distress. Eyes: Pink conjunctiva, anicteric sclera. HEENT: Normocephalic, moist mucous membranes. Lungs: No audible wheezing or coughing. Heart: Regular rate and rhythm. Abdomen: Soft, nontender, no obvious distention. Musculoskeletal: No edema, cyanosis, or clubbing. Neuro: Alert, answering all questions appropriately. Cranial nerves grossly intact. Skin: No rashes or petechiae noted. Psych: Normal affect.  LAB RESULTS:  Lab Results  Component Value Date   NA 137 03/14/2023   K 3.3 (L) 03/14/2023   CL 105 03/14/2023   CO2 24 03/14/2023   GLUCOSE 87 03/14/2023   BUN 66 (H) 06/05/2023   CREATININE 1.43 (H) 06/05/2023   CALCIUM 9.5 03/14/2023   PROT 7.1 07/24/2022   ALBUMIN  3.2 (L) 07/24/2022   AST 21 07/24/2022   ALT 14 07/24/2022   ALKPHOS 120 07/24/2022   BILITOT 0.6 07/24/2022   GFRNONAA 36 (L) 06/05/2023   GFRAA >60 08/13/2017    Lab Results  Component Value Date   WBC 4.4 09/14/2023   NEUTROABS 2.0 07/27/2022   HGB  9.2 (L) 09/14/2023   HCT 29.7 (L) 09/14/2023   MCV 85.8 09/14/2023   PLT 92 (L) 09/14/2023   Lab Results  Component Value Date   IRON 33 09/14/2023   TIBC 405 09/14/2023   IRONPCTSAT 8 (L) 09/14/2023   Lab  Results  Component Value Date   FERRITIN 11 09/14/2023     STUDIES: No results found.  ASSESSMENT: Iron deficiency anemia, thrombocytopenia.  PLAN:    Thrombocytopenia: Upon review of patient's chart she has had intermittently decreased platelet count since at least December 2016 ranging between 92 and 155.  Her results today are 78.  Likely related to her treatment for rheumatoid arthritis.  All of her laboratory work is either negative or within normal limits.  Patient does not require bone marrow biopsy.  Iron deficiency anemia: Patient noted to have a decreased hemoglobin of 9.2 with mildly reduced iron stores.  Previously, all of her laboratory work was either negative or within normal limits.  Although is likely a component of bone marrow suppression causing her anemia from her rheumatoid treatments.  She also may benefit from IV iron.  Return to clinic 3 times over the next 1 to 2 weeks to receive 200 mg IV Venofer.  Patient will then return to clinic in 4 months for repeat laboratory work, further evaluation, and continuation of treatment if needed.    I spent a total of 30 minutes reviewing chart data, face-to-face evaluation with the patient, counseling and coordination of care as detailed above.    Patient expressed understanding and was in agreement with this plan. She also understands that She can call clinic at any time with any questions, concerns, or complaints.    Courtney JINNY Reusing, MD   10/03/2023 2:37 PM

## 2023-10-03 NOTE — Progress Notes (Unsigned)
 Patient is doing ok, she is still having some shoulder pain due to her arthritis. And her legs and ankles are still a little swollen.

## 2023-10-04 ENCOUNTER — Encounter: Payer: Self-pay | Admitting: Oncology

## 2023-10-04 ENCOUNTER — Other Ambulatory Visit: Payer: Self-pay | Admitting: Oncology

## 2023-10-04 DIAGNOSIS — D509 Iron deficiency anemia, unspecified: Secondary | ICD-10-CM | POA: Insufficient documentation

## 2023-10-04 LAB — INTELLIGEN MYELOID

## 2023-10-08 ENCOUNTER — Inpatient Hospital Stay

## 2023-10-08 VITALS — BP 143/66 | HR 66 | Temp 97.1°F | Resp 18

## 2023-10-08 DIAGNOSIS — D696 Thrombocytopenia, unspecified: Secondary | ICD-10-CM | POA: Diagnosis not present

## 2023-10-08 DIAGNOSIS — D509 Iron deficiency anemia, unspecified: Secondary | ICD-10-CM

## 2023-10-08 MED ORDER — SODIUM CHLORIDE 0.9% FLUSH
10.0000 mL | Freq: Once | INTRAVENOUS | Status: AC | PRN
  Administered 2023-10-08: 10 mL
  Filled 2023-10-08: qty 10

## 2023-10-08 MED ORDER — IRON SUCROSE 20 MG/ML IV SOLN
200.0000 mg | Freq: Once | INTRAVENOUS | Status: AC
  Administered 2023-10-08: 200 mg via INTRAVENOUS
  Filled 2023-10-08: qty 10

## 2023-10-10 ENCOUNTER — Inpatient Hospital Stay: Attending: Oncology

## 2023-10-10 VITALS — BP 158/65 | HR 73 | Temp 98.2°F | Resp 18

## 2023-10-10 DIAGNOSIS — D509 Iron deficiency anemia, unspecified: Secondary | ICD-10-CM | POA: Diagnosis present

## 2023-10-10 MED ORDER — SODIUM CHLORIDE 0.9% FLUSH
10.0000 mL | Freq: Once | INTRAVENOUS | Status: AC | PRN
  Administered 2023-10-10: 10 mL
  Filled 2023-10-10: qty 10

## 2023-10-10 MED ORDER — IRON SUCROSE 20 MG/ML IV SOLN
200.0000 mg | Freq: Once | INTRAVENOUS | Status: AC
  Administered 2023-10-10: 200 mg via INTRAVENOUS
  Filled 2023-10-10: qty 10

## 2023-10-12 ENCOUNTER — Inpatient Hospital Stay

## 2023-10-12 VITALS — BP 121/49 | HR 75 | Temp 97.9°F | Resp 18

## 2023-10-12 DIAGNOSIS — D509 Iron deficiency anemia, unspecified: Secondary | ICD-10-CM

## 2023-10-12 MED ORDER — IRON SUCROSE 20 MG/ML IV SOLN
200.0000 mg | Freq: Once | INTRAVENOUS | Status: AC
  Administered 2023-10-12: 200 mg via INTRAVENOUS
  Filled 2023-10-12: qty 10

## 2023-10-12 NOTE — Patient Instructions (Signed)

## 2023-10-31 ENCOUNTER — Ambulatory Visit
Admission: RE | Admit: 2023-10-31 | Discharge: 2023-10-31 | Disposition: A | Discharge status: Home / Self Care | Source: Ambulatory Visit | Attending: Specialist | Admitting: Specialist

## 2023-10-31 DIAGNOSIS — R918 Other nonspecific abnormal finding of lung field: Secondary | ICD-10-CM | POA: Diagnosis present

## 2023-11-06 ENCOUNTER — Other Ambulatory Visit: Payer: Self-pay | Admitting: Specialist

## 2023-11-06 DIAGNOSIS — R918 Other nonspecific abnormal finding of lung field: Secondary | ICD-10-CM

## 2023-12-03 ENCOUNTER — Other Ambulatory Visit: Payer: Self-pay | Admitting: General Surgery

## 2023-12-03 DIAGNOSIS — Z1231 Encounter for screening mammogram for malignant neoplasm of breast: Secondary | ICD-10-CM

## 2023-12-27 DIAGNOSIS — I739 Peripheral vascular disease, unspecified: Secondary | ICD-10-CM

## 2023-12-31 ENCOUNTER — Ambulatory Visit (INDEPENDENT_AMBULATORY_CARE_PROVIDER_SITE_OTHER): Admitting: Vascular Surgery

## 2023-12-31 ENCOUNTER — Encounter (INDEPENDENT_AMBULATORY_CARE_PROVIDER_SITE_OTHER): Payer: Self-pay | Admitting: Vascular Surgery

## 2023-12-31 ENCOUNTER — Other Ambulatory Visit (INDEPENDENT_AMBULATORY_CARE_PROVIDER_SITE_OTHER)

## 2023-12-31 VITALS — BP 104/55 | HR 71 | Resp 18

## 2023-12-31 DIAGNOSIS — Z9889 Other specified postprocedural states: Secondary | ICD-10-CM

## 2023-12-31 DIAGNOSIS — I70213 Atherosclerosis of native arteries of extremities with intermittent claudication, bilateral legs: Secondary | ICD-10-CM

## 2023-12-31 DIAGNOSIS — M51369 Other intervertebral disc degeneration, lumbar region without mention of lumbar back pain or lower extremity pain: Secondary | ICD-10-CM

## 2023-12-31 DIAGNOSIS — I872 Venous insufficiency (chronic) (peripheral): Secondary | ICD-10-CM

## 2023-12-31 DIAGNOSIS — E119 Type 2 diabetes mellitus without complications: Secondary | ICD-10-CM

## 2023-12-31 DIAGNOSIS — I739 Peripheral vascular disease, unspecified: Secondary | ICD-10-CM

## 2023-12-31 DIAGNOSIS — I10 Essential (primary) hypertension: Secondary | ICD-10-CM

## 2024-01-01 ENCOUNTER — Encounter (INDEPENDENT_AMBULATORY_CARE_PROVIDER_SITE_OTHER): Payer: Self-pay | Admitting: Vascular Surgery

## 2024-01-01 DIAGNOSIS — I872 Venous insufficiency (chronic) (peripheral): Secondary | ICD-10-CM | POA: Insufficient documentation

## 2024-01-01 NOTE — Progress Notes (Signed)
 "                                                                      MRN : [id number]  Courtney [name] is a 83 y.o. (08-May-1940) female who presents with chief complaint of check circulation.  History of Present Illness:  The patient returns to the office for followup and review status post angiogram without intervention on 06/05/2023.    Procedure: Diagnostic left lower extremity angiogram patient has single-vessel tibial runoff to the foot via the anterior tibial.   The patient notes improvement in the lower extremity symptoms. No interval shortening of the patient's claudication distance or rest pain symptoms. No new ulcers or wounds have occurred since the last visit.   There have been no significant changes to the patient's overall health care.   No documented history of amaurosis fugax or recent TIA symptoms. There are no recent neurological changes noted. No documented history of DVT, PE or superficial thrombophlebitis. The patient denies recent episodes of angina or shortness of breath.   ABI's done today show Rt=[place] (TBI=amp) and Lt=[place] (TBI=0.39) (previous ABI's Rt=[place] (TBI=amp) and Lt=[place] (TBI=0.34)).  Active Medications[1]  Past Medical History:  Diagnosis Date   B12 deficiency    Chronic urticaria    Dermatitis    Eczema    Fibrosis, pulmonary, interstitial, diffuse (HCC)    GERD (gastroesophageal reflux disease)    Headache    migraines in the past (none since menopause)   Heart murmur    History of hiatal hernia    Hypertension    Loose stools    Osteoarthritis of spine    Plantar fasciitis    Pneumonia    PONV (postoperative nausea and vomiting)    Psoriasis    Rheumatoid arthritis (HCC)    Rosacea    Sleep apnea 2004   does not use cpap   Squamous cell skin cancer    T2DM (type 2 diabetes mellitus) (HCC)    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    Varicose veins     Past Surgical History:  Procedure Laterality Date    ABDOMINAL AORTOGRAM W/LOWER EXTREMITY Left 07/25/2022   Procedure: ABDOMINAL AORTOGRAM W/LOWER EXTREMITY;  Surgeon: Jama Cordella MATSU, MD;  Location: [facility];  Service: Cardiovascular;  Laterality: Left;   AMPUTATION TOE Right 03/16/2023   Procedure: AMPUTATION TOE IPJ X 2 Great toe and second toe;  Surgeon: Ashley Soulier, DPM;  Location: [facility];  Service: Orthopedics/Podiatry;  Laterality: Right;   ANTERIOR CERVICAL DECOMP/DISCECTOMY FUSION N/A 08/20/2017   Procedure: Anterior Cervical Decompression Fusion - Cervical Four - Cervical Five - Cervical Five - Cervical Six;  Surgeon: Louis Shove, MD;  Location: [facility] OR;  Service: Neurosurgery;  Laterality: N/A;  Anterior Cervical Decompression Fusion - Cervical Four - Cervical Five - Cervical Five - Cervical Six   APPENDECTOMY     BILATERAL CARPAL TUNNEL RELEASE     BREAST BIOPSY Left 04/24/2022   US  Bx, 6:00 heart clip, SUBAREOLAR BREAST TISSUE WITH RUPTURED DUCT ECTASIA.   BREAST BIOPSY Left 04/24/2022   US  bx, 4:00 coil clip, SUBAREOLAR BREAST TISSUE WITH AREAS OF STROMAL FIBROSIS.   BREAST BIOPSY Left 04/24/2022  US  LT BREAST BX W LOC DEV 1ST LESION IMG BX SPEC US  GUIDE 04/24/2022 [facility]   BREAST BIOPSY Left 04/24/2022   US  LT BREAST BX W LOC DEV EA ADD LESION IMG BX SPEC US  GUIDE 04/24/2022 [facility]   BREAST BIOPSY WITH RADIO FREQUENCY LOCALIZER Left 06/19/2022   Procedure: BREAST BIOPSY WITH RADIO FREQUENCY LOCALIZER;  Surgeon: Rodolph Romano, MD;  Location: [facility];  Service: General;  Laterality: Left;   BREAST CYST ASPIRATION Left    neg   CATARACT EXTRACTION Bilateral    CHOLECYSTECTOMY  1963   COLONOSCOPY WITH PROPOFOL  N/A 12/31/2015   Procedure: COLONOSCOPY WITH PROPOFOL ;  Surgeon: Gladis RAYMOND Mariner, MD;  Location: [facility] ENDOSCOPY;  Service: Endoscopy;  Laterality: N/A;   ENDOMETRIAL ABLATION  1991   ESOPHAGOGASTRODUODENOSCOPY (EGD) WITH PROPOFOL  N/A 12/31/2015   Procedure:  ESOPHAGOGASTRODUODENOSCOPY (EGD) WITH PROPOFOL ;  Surgeon: Gladis RAYMOND Mariner, MD;  Location: [facility] ENDOSCOPY;  Service: Endoscopy;  Laterality: N/A;   ESOPHAGOGASTRODUODENOSCOPY (EGD) WITH PROPOFOL  N/A 06/02/2016   Procedure: ESOPHAGOGASTRODUODENOSCOPY (EGD) WITH PROPOFOL ;  Surgeon: Mariner Gladis RAYMOND, MD;  Location: [facility] ENDOSCOPY;  Service: Endoscopy;  Laterality: N/A;   ESOPHAGOGASTRODUODENOSCOPY (EGD) WITH PROPOFOL  N/A 11/07/2016   Procedure: ESOPHAGOGASTRODUODENOSCOPY (EGD) WITH PROPOFOL ;  Surgeon: Mariner Gladis RAYMOND, MD;  Location: [facility] ENDOSCOPY;  Service: Endoscopy;  Laterality: N/A;   EYE SURGERY Bilateral 2006 and 2012   cataract surgery with lens implant   eyelid surgery Bilateral 2014   FOOT SURGERY Right    ligament and spurs   LOWER EXTREMITY ANGIOGRAPHY Right 03/07/2023   Procedure: Lower Extremity Angiography;  Surgeon: Marea Selinda RAMAN, MD;  Location: [facility];  Service: Cardiovascular;  Laterality: Right;   LOWER EXTREMITY ANGIOGRAPHY Left 06/05/2023   Procedure: Lower Extremity Angiography;  Surgeon: Jama Cordella MATSU, MD;  Location: [facility];  Service: Cardiovascular;  Laterality: Left;   LUMBAR LAMINECTOMY/DECOMPRESSION MICRODISCECTOMY Right 12/24/2013   Procedure: RIGHT LUMBAR THREE TO FOUR, LUMBAR FOUR TO FIVE, LUMBAR FIVE TO SACRAL ONE LAMINECTOMY/FORAMINOTOMY;  Surgeon: Catalina CHRISTELLA Stains, MD;  Location: [facility];  Service: Neurosurgery;  Laterality: Right;  RIGHT L3-4 L4-5 L5-S1 LAMINECTOMY/FORAMINOTOMY   NASAL SEPTUM SURGERY  2004   SHOULDER SURGERY Right    for a frozen shoulder   SKIN CANCER EXCISION Right    leg x 4   skin cancer removal     TONSILLECTOMY     TUBAL LIGATION  1968    Social History Social History[2]  Family History Family History  Problem Relation Age of Onset   COPD Mother    Arthritis/Rheumatoid Mother    Alzheimer's disease Father    Heart attack Father    Breast cancer Paternal Aunt 70     Allergies[3]   REVIEW OF SYSTEMS (Negative unless checked)  Constitutional: [] Weight loss  [] Fever  [] Chills Cardiac: [] Chest pain   [] Chest pressure   [] Palpitations   [] Shortness of breath when laying flat   [] Shortness of breath with exertion. Vascular:  [x] Pain in legs with walking   [] Pain in legs at rest  [] History of DVT   [] Phlebitis   [] Swelling in legs   [] Varicose veins   [] Non-healing ulcers Pulmonary:   [] Uses home oxygen   [] Productive cough   [] Hemoptysis   [] Wheeze  [] COPD   [] Asthma Neurologic:  [] Dizziness   [] Seizures   [] History of stroke   [] History of TIA  [] Aphasia   [] Vissual changes   [] Weakness or numbness in arm   [] Weakness or numbness in leg  Musculoskeletal:   [] Joint swelling   [] Joint pain   [] Low back pain Hematologic:  [] Easy bruising  [] Easy bleeding   [] Hypercoagulable state   [] Anemic Gastrointestinal:  [] Diarrhea   [] Vomiting  [] Gastroesophageal reflux/heartburn   [] Difficulty swallowing. Genitourinary:  [] Chronic kidney disease   [] Difficult urination  [] Frequent urination   [] Blood in urine Skin:  [] Rashes   [] Ulcers  Psychological:  [] History of anxiety   []  History of major depression.  Physical Examination  Vitals:   12/31/23 1422  BP: (!) 104/55  Pulse: 71  Resp: 18   There is no height or weight on file to calculate BMI. Gen: WD/WN, NAD Head: [place]/AT, No temporalis wasting.  Ear/Nose/Throat: Hearing grossly intact, nares w/o erythema or drainage Eyes: PER, EOMI, sclera nonicteric.  Neck: Supple, no masses.  No bruit or JVD.  Pulmonary:  Good air movement, no audible wheezing, no use of accessory muscles.  Cardiac: RRR, normal S1, S2, no Murmurs. Vascular:  mild trophic changes, no open wounds Vessel Right Left  Radial Palpable Palpable  PT Not Palpable Not Palpable  DP Not Palpable Not Palpable  Gastrointestinal: soft, non-distended. No guarding/no peritoneal signs.  Musculoskeletal: M/S 5/5 throughout.  No visible deformity.   Neurologic: CN 2-12 intact. Pain and light touch intact in extremities.  Symmetrical.  Speech is fluent. Motor exam as listed above. Psychiatric: Judgment intact, Mood & affect appropriate for pt's clinical situation. Dermatologic: No rashes or ulcers noted.  No changes consistent with cellulitis.   CBC Lab Results  Component Value Date   WBC 4.4 09/14/2023   HGB 9.2 (L) 09/14/2023   HCT 29.7 (L) 09/14/2023   MCV 85.8 09/14/2023   PLT 92 (L) 09/14/2023    BMET    Component Value Date/Time   NA 137 03/14/2023 1441   K 3.3 (L) 03/14/2023 1441   CL 105 03/14/2023 1441   CO2 24 03/14/2023 1441   GLUCOSE 87 03/14/2023 1441   BUN 66 (H) 06/05/2023 0734   CREATININE 1.43 (H) 06/05/2023 0734   CALCIUM 9.5 03/14/2023 1441   GFRNONAA 36 (L) 06/05/2023 0734   GFRAA >60 08/13/2017 1430   CrCl cannot be calculated (Patient's most recent lab result is older than the maximum 21 days allowed.).  COAG Lab Results  Component Value Date   INR 1.22 12/25/2014    Radiology No results found.   Assessment/Plan 1. Atherosclerosis of native artery of both lower extremities with intermittent claudication (Primary)  Recommend:   The patient has evidence of atherosclerosis of the lower extremities with claudication.  The patient does not voice lifestyle limiting changes at this point in time.   Noninvasive studies do not suggest clinically significant change.   No invasive studies, angiography or surgery at this time The patient should continue walking and begin a more formal exercise program.  The patient should continue antiplatelet therapy and aggressive treatment of the lipid abnormalities   No changes in the patient's medications at this time   Continued surveillance is indicated as atherosclerosis is likely to progress with time.     The patient will continue follow up with noninvasive studies as ordered.  - VAS US  ABI WITH/WO TBI; Future  2. Chronic venous insufficiency No  surgery or intervention at this point in time.   The patient is CEAP [id number]   I have discussed with the patient venous insufficiency and why it  causes symptoms. I have discussed with the patient the chronic skin changes that accompany venous insufficiency and the  long term sequela such as infection and ulceration.  Patient will begin wearing graduated compression stockings or compression wraps on a daily basis.  The patient will put the compression on first thing in the morning and removing them in the evening. The patient is instructed specifically not to sleep in the compression.    In addition, behavioral modification including several periods of elevation of the lower extremities during the day will be continued. I have demonstrated that proper elevation is a position with the ankles at heart level.  The patient is instructed to begin routine exercise, especially walking on a daily basis  The patient will be assessed for a Lymph Pump depending on the effectiveness of conservative therapy and the control of the associated lymphedema.  3. Essential hypertension Continue antihypertensive medications as already ordered, these medications have been reviewed and there are no changes at this time.  4. Type 2 diabetes mellitus without complication, without long-term current use of insulin  (HCC) Continue hypoglycemic medications as already ordered, these medications have been reviewed and there are no changes at this time.  Hgb A1C to be monitored as already arranged by primary service  5. Degeneration of intervertebral disc of lumbar region, unspecified whether pain present Continue medications to treat the patient's degenerative disease as already ordered, these medications have been reviewed and there are no changes at this time.  Continued activity and therapy was stressed.    Cordella Shawl, MD  01/01/2024 10:40 AM      [1]  Current Meds  Medication Sig   acetaminophen   (TYLENOL ) 500 MG tablet Take 500 mg by mouth every 6 (six) hours as needed (for pain.).    aspirin  EC 81 MG tablet Take 81 mg by mouth daily.   bisoprolol  (ZEBETA ) 10 MG tablet Take 1 tablet (10 mg total) by mouth daily.   cetirizine (ZYRTEC) 10 MG tablet Take 10 mg by mouth at bedtime.    Cholecalciferol  (D3) 25 MCG (1000 UT) capsule Take 1,000 Units by mouth daily.   clopidogrel  (PLAVIX ) 75 MG tablet Take 1 tablet (75 mg total) by mouth daily.   famotidine  (PEPCID ) 40 MG tablet Take 40 mg by mouth daily.   furosemide (LASIX) 20 MG tablet Take 20 mg by mouth 2 (two) times daily.   gabapentin  (NEURONTIN ) 100 MG capsule Take 100 mg by mouth 2 (two) times daily.   inFLIXimab  (REMICADE ) 100 MG injection Inject 500 mg into the vein every 6 (six) weeks.   insulin  NPH Human (HUMULIN  N,NOVOLIN N) 100 UNIT/ML injection Inject 0.4 mLs (40 Units total) into the skin daily before breakfast.   leflunomide  (ARAVA ) 10 MG tablet Take 10 mg by mouth daily.   montelukast  (SINGULAIR ) 10 MG tablet Take 10 mg by mouth daily.   pantoprazole  (PROTONIX ) 40 MG tablet Take 40 mg by mouth 2 (two) times daily.   rOPINIRole  (REQUIP ) 0.5 MG tablet Take 0.5 mg by mouth at bedtime as needed (RLS).   simvastatin  (ZOCOR ) 40 MG tablet Take 40 mg by mouth at bedtime.  [2]  Social History Tobacco Use   Smoking status: Never   Smokeless tobacco: Never  Vaping Use   Vaping status: Never Used  Substance Use Topics   Alcohol use: No   Drug use: No  [3]  Allergies Allergen Reactions   Methotrexate And Trimetrexate Shortness Of Breath    Breathing difficulties    Acyclovir And Related Rash   Codeine Nausea Only   Contrast Media [Iodinated Contrast Media] Itching and Rash  Hydroxychloroquine Rash   Inderide [Propranolol-Hctz] Rash   Lodine [Etodolac] Rash   Metrizamide Itching and Rash   Procardia [Nifedipine] Rash   Propranolol Hcl Rash   "

## 2024-01-07 LAB — VAS US ABI WITH/WO TBI

## 2024-02-01 ENCOUNTER — Inpatient Hospital Stay: Attending: Oncology

## 2024-02-01 ENCOUNTER — Other Ambulatory Visit: Payer: Self-pay

## 2024-02-01 ENCOUNTER — Other Ambulatory Visit: Payer: Self-pay | Admitting: Oncology

## 2024-02-01 DIAGNOSIS — D509 Iron deficiency anemia, unspecified: Secondary | ICD-10-CM

## 2024-02-01 LAB — CBC WITH DIFFERENTIAL/PLATELET
Abs Immature Granulocytes: 0.01 K/uL (ref 0.00–0.07)
Basophils Absolute: 0 K/uL (ref 0.0–0.1)
Basophils Relative: 1 %
Eosinophils Absolute: 0.4 K/uL (ref 0.0–0.5)
Eosinophils Relative: 7 %
HCT: 37 % (ref 36.0–46.0)
Hemoglobin: 11.7 g/dL — ABNORMAL LOW (ref 12.0–15.0)
Immature Granulocytes: 0 %
Lymphocytes Relative: 39 %
Lymphs Abs: 2.2 K/uL (ref 0.7–4.0)
MCH: 28.6 pg (ref 26.0–34.0)
MCHC: 31.6 g/dL (ref 30.0–36.0)
MCV: 90.5 fL (ref 80.0–100.0)
Monocytes Absolute: 0.6 K/uL (ref 0.1–1.0)
Monocytes Relative: 11 %
Neutro Abs: 2.4 K/uL (ref 1.7–7.7)
Neutrophils Relative %: 42 %
Platelets: 106 K/uL — ABNORMAL LOW (ref 150–400)
RBC: 4.09 MIL/uL (ref 3.87–5.11)
RDW: 14.6 % (ref 11.5–15.5)
WBC: 5.6 K/uL (ref 4.0–10.5)
nRBC: 0 % (ref 0.0–0.2)

## 2024-02-01 LAB — IRON AND TIBC
Iron: 97 ug/dL (ref 28–170)
Saturation Ratios: 32 % — ABNORMAL HIGH (ref 10.4–31.8)
TIBC: 305 ug/dL (ref 250–450)
UIBC: 208 ug/dL

## 2024-02-01 LAB — FERRITIN: Ferritin: 145 ng/mL (ref 11–307)

## 2024-02-04 ENCOUNTER — Other Ambulatory Visit

## 2024-02-05 ENCOUNTER — Ambulatory Visit: Admitting: Oncology

## 2024-02-05 ENCOUNTER — Inpatient Hospital Stay

## 2024-02-11 ENCOUNTER — Encounter

## 2024-03-05 ENCOUNTER — Encounter

## 2024-05-12 ENCOUNTER — Inpatient Hospital Stay

## 2024-05-13 ENCOUNTER — Inpatient Hospital Stay

## 2024-05-13 ENCOUNTER — Inpatient Hospital Stay: Admitting: Oncology

## 2024-06-30 ENCOUNTER — Encounter (INDEPENDENT_AMBULATORY_CARE_PROVIDER_SITE_OTHER)

## 2024-06-30 ENCOUNTER — Ambulatory Visit (INDEPENDENT_AMBULATORY_CARE_PROVIDER_SITE_OTHER): Admitting: Nurse Practitioner
